# Patient Record
Sex: Female | Born: 1950
Health system: Southern US, Community
[De-identification: ages and names within clinical notes are randomized; demographics above are authoritative.]

## PROBLEM LIST (undated history)

## (undated) DIAGNOSIS — R32 Unspecified urinary incontinence: Secondary | ICD-10-CM

## (undated) DIAGNOSIS — K5792 Diverticulitis of intestine, part unspecified, without perforation or abscess without bleeding: Secondary | ICD-10-CM

## (undated) DIAGNOSIS — E785 Hyperlipidemia, unspecified: Secondary | ICD-10-CM

## (undated) DIAGNOSIS — M545 Low back pain, unspecified: Secondary | ICD-10-CM

## (undated) DIAGNOSIS — G4700 Insomnia, unspecified: Secondary | ICD-10-CM

## (undated) DIAGNOSIS — I499 Cardiac arrhythmia, unspecified: Secondary | ICD-10-CM

## (undated) DIAGNOSIS — I10 Essential (primary) hypertension: Secondary | ICD-10-CM

## (undated) DIAGNOSIS — G8929 Other chronic pain: Secondary | ICD-10-CM

## (undated) DIAGNOSIS — K219 Gastro-esophageal reflux disease without esophagitis: Secondary | ICD-10-CM

## (undated) DIAGNOSIS — K589 Irritable bowel syndrome without diarrhea: Secondary | ICD-10-CM

## (undated) DIAGNOSIS — M797 Fibromyalgia: Secondary | ICD-10-CM

## (undated) DIAGNOSIS — E119 Type 2 diabetes mellitus without complications: Secondary | ICD-10-CM

## (undated) DIAGNOSIS — R519 Headache, unspecified: Secondary | ICD-10-CM

## (undated) DIAGNOSIS — G629 Polyneuropathy, unspecified: Secondary | ICD-10-CM

## (undated) DIAGNOSIS — I491 Atrial premature depolarization: Secondary | ICD-10-CM

## (undated) DIAGNOSIS — R51 Headache: Secondary | ICD-10-CM

## (undated) DIAGNOSIS — M1990 Unspecified osteoarthritis, unspecified site: Secondary | ICD-10-CM

## (undated) DIAGNOSIS — K5790 Diverticulosis of intestine, part unspecified, without perforation or abscess without bleeding: Secondary | ICD-10-CM

## (undated) DIAGNOSIS — J45909 Unspecified asthma, uncomplicated: Secondary | ICD-10-CM

## (undated) DIAGNOSIS — E559 Vitamin D deficiency, unspecified: Secondary | ICD-10-CM

## (undated) HISTORY — DX: Unspecified osteoarthritis, unspecified site: M19.90

## (undated) HISTORY — DX: Type 2 diabetes mellitus without complications: E11.9

## (undated) HISTORY — DX: Cardiac arrhythmia, unspecified: I49.9

## (undated) HISTORY — PX: BACK SURGERY: SHX140

## (undated) HISTORY — DX: Low back pain: M54.5

## (undated) HISTORY — DX: Irritable bowel syndrome without diarrhea: K58.9

## (undated) HISTORY — DX: Diverticulosis of intestine, part unspecified, without perforation or abscess without bleeding: K57.90

## (undated) HISTORY — PX: OTHER SURGICAL HISTORY: SHX169

## (undated) HISTORY — PX: TOTAL KNEE ARTHROPLASTY: SHX125

## (undated) HISTORY — DX: Fibromyalgia: M79.7

## (undated) HISTORY — DX: Headache: R51

## (undated) HISTORY — DX: Low back pain, unspecified: M54.50

## (undated) HISTORY — DX: Atrial premature depolarization: I49.1

## (undated) HISTORY — DX: Gastro-esophageal reflux disease without esophagitis: K21.9

## (undated) HISTORY — DX: Vitamin D deficiency, unspecified: E55.9

## (undated) HISTORY — DX: Essential (primary) hypertension: I10

## (undated) HISTORY — DX: Hyperlipidemia, unspecified: E78.5

## (undated) HISTORY — DX: Unspecified asthma, uncomplicated: J45.909

## (undated) HISTORY — DX: Polyneuropathy, unspecified: G62.9

## (undated) HISTORY — DX: Insomnia, unspecified: G47.00

## (undated) HISTORY — DX: Morbid (severe) obesity due to excess calories: E66.01

## (undated) HISTORY — DX: Headache, unspecified: R51.9

## (undated) HISTORY — DX: Diverticulitis of intestine, part unspecified, without perforation or abscess without bleeding: K57.92

## (undated) HISTORY — DX: Unspecified urinary incontinence: R32

## (undated) HISTORY — DX: Other chronic pain: G89.29

---

## 1988-11-08 HISTORY — PX: OTHER SURGICAL HISTORY: SHX169

## 1991-11-09 HISTORY — PX: OTHER SURGICAL HISTORY: SHX169

## 1998-04-25 ENCOUNTER — Ambulatory Visit (HOSPITAL_COMMUNITY): Admission: RE | Admit: 1998-04-25 | Discharge: 1998-04-25 | Payer: Self-pay | Admitting: *Deleted

## 1998-09-05 ENCOUNTER — Ambulatory Visit (HOSPITAL_COMMUNITY): Admission: RE | Admit: 1998-09-05 | Discharge: 1998-09-05 | Payer: Self-pay | Admitting: *Deleted

## 1998-09-05 ENCOUNTER — Encounter: Payer: Self-pay | Admitting: *Deleted

## 2001-12-28 ENCOUNTER — Encounter: Payer: Self-pay | Admitting: Internal Medicine

## 2001-12-28 ENCOUNTER — Ambulatory Visit (HOSPITAL_COMMUNITY): Admission: RE | Admit: 2001-12-28 | Discharge: 2001-12-28 | Payer: Self-pay | Admitting: Internal Medicine

## 2002-01-16 ENCOUNTER — Other Ambulatory Visit: Admission: RE | Admit: 2002-01-16 | Discharge: 2002-01-16 | Payer: Self-pay | Admitting: Internal Medicine

## 2004-01-23 ENCOUNTER — Emergency Department (HOSPITAL_COMMUNITY): Admission: EM | Admit: 2004-01-23 | Discharge: 2004-01-24 | Payer: Self-pay | Admitting: Emergency Medicine

## 2004-02-20 ENCOUNTER — Encounter: Admission: RE | Admit: 2004-02-20 | Discharge: 2004-02-20 | Payer: Self-pay | Admitting: Internal Medicine

## 2005-01-22 ENCOUNTER — Encounter: Admission: RE | Admit: 2005-01-22 | Discharge: 2005-01-22 | Payer: Self-pay | Admitting: Internal Medicine

## 2006-04-29 ENCOUNTER — Encounter: Admission: RE | Admit: 2006-04-29 | Discharge: 2006-04-29 | Payer: Self-pay | Admitting: Internal Medicine

## 2007-05-18 ENCOUNTER — Ambulatory Visit (HOSPITAL_BASED_OUTPATIENT_CLINIC_OR_DEPARTMENT_OTHER): Admission: RE | Admit: 2007-05-18 | Discharge: 2007-05-18 | Payer: Self-pay | Admitting: Orthopedic Surgery

## 2007-05-18 ENCOUNTER — Encounter (INDEPENDENT_AMBULATORY_CARE_PROVIDER_SITE_OTHER): Payer: Self-pay | Admitting: Orthopedic Surgery

## 2007-06-27 ENCOUNTER — Encounter (HOSPITAL_BASED_OUTPATIENT_CLINIC_OR_DEPARTMENT_OTHER): Admission: RE | Admit: 2007-06-27 | Discharge: 2007-08-08 | Payer: Self-pay | Admitting: Surgery

## 2007-07-28 ENCOUNTER — Encounter: Admission: RE | Admit: 2007-07-28 | Discharge: 2007-07-28 | Payer: Self-pay | Admitting: Internal Medicine

## 2007-08-17 ENCOUNTER — Encounter (HOSPITAL_BASED_OUTPATIENT_CLINIC_OR_DEPARTMENT_OTHER): Admission: RE | Admit: 2007-08-17 | Discharge: 2007-11-15 | Payer: Self-pay | Admitting: Surgery

## 2007-11-09 HISTORY — PX: OTHER SURGICAL HISTORY: SHX169

## 2007-12-04 ENCOUNTER — Encounter (INDEPENDENT_AMBULATORY_CARE_PROVIDER_SITE_OTHER): Payer: Self-pay | Admitting: Orthopedic Surgery

## 2007-12-04 ENCOUNTER — Ambulatory Visit (HOSPITAL_BASED_OUTPATIENT_CLINIC_OR_DEPARTMENT_OTHER): Admission: RE | Admit: 2007-12-04 | Discharge: 2007-12-05 | Payer: Self-pay | Admitting: Orthopedic Surgery

## 2007-12-21 ENCOUNTER — Encounter (INDEPENDENT_AMBULATORY_CARE_PROVIDER_SITE_OTHER): Payer: Self-pay | Admitting: Orthopedic Surgery

## 2007-12-21 ENCOUNTER — Ambulatory Visit (HOSPITAL_BASED_OUTPATIENT_CLINIC_OR_DEPARTMENT_OTHER): Admission: RE | Admit: 2007-12-21 | Discharge: 2007-12-21 | Payer: Self-pay | Admitting: Orthopedic Surgery

## 2008-03-29 ENCOUNTER — Encounter: Admission: RE | Admit: 2008-03-29 | Discharge: 2008-03-29 | Payer: Self-pay | Admitting: Orthopedic Surgery

## 2008-06-07 ENCOUNTER — Encounter: Admission: RE | Admit: 2008-06-07 | Discharge: 2008-06-07 | Payer: Self-pay | Admitting: Internal Medicine

## 2008-07-23 ENCOUNTER — Ambulatory Visit (HOSPITAL_COMMUNITY): Admission: RE | Admit: 2008-07-23 | Discharge: 2008-07-23 | Payer: Self-pay | Admitting: Orthopedic Surgery

## 2009-04-12 ENCOUNTER — Emergency Department (HOSPITAL_COMMUNITY): Admission: EM | Admit: 2009-04-12 | Discharge: 2009-04-12 | Payer: Self-pay | Admitting: Emergency Medicine

## 2010-11-29 ENCOUNTER — Encounter: Payer: Self-pay | Admitting: Internal Medicine

## 2010-11-29 ENCOUNTER — Encounter: Payer: Self-pay | Admitting: Orthopedic Surgery

## 2011-02-10 ENCOUNTER — Encounter (HOSPITAL_BASED_OUTPATIENT_CLINIC_OR_DEPARTMENT_OTHER): Payer: Medicare Other | Attending: Plastic Surgery

## 2011-02-10 DIAGNOSIS — E1169 Type 2 diabetes mellitus with other specified complication: Secondary | ICD-10-CM | POA: Insufficient documentation

## 2011-02-10 DIAGNOSIS — L97509 Non-pressure chronic ulcer of other part of unspecified foot with unspecified severity: Secondary | ICD-10-CM | POA: Insufficient documentation

## 2011-02-11 ENCOUNTER — Encounter (HOSPITAL_BASED_OUTPATIENT_CLINIC_OR_DEPARTMENT_OTHER): Payer: Medicare Other

## 2011-02-15 LAB — GLUCOSE, CAPILLARY: Glucose-Capillary: 218 mg/dL — ABNORMAL HIGH (ref 70–99)

## 2011-02-17 ENCOUNTER — Other Ambulatory Visit (HOSPITAL_COMMUNITY): Payer: Self-pay | Admitting: Plastic Surgery

## 2011-02-17 ENCOUNTER — Ambulatory Visit (HOSPITAL_COMMUNITY)
Admission: RE | Admit: 2011-02-17 | Discharge: 2011-02-17 | Disposition: A | Discharge status: Home / Self Care | Payer: Medicare Other | Source: Ambulatory Visit | Attending: Plastic Surgery | Admitting: Plastic Surgery

## 2011-02-17 DIAGNOSIS — L97509 Non-pressure chronic ulcer of other part of unspecified foot with unspecified severity: Secondary | ICD-10-CM | POA: Insufficient documentation

## 2011-02-17 DIAGNOSIS — R0602 Shortness of breath: Secondary | ICD-10-CM | POA: Insufficient documentation

## 2011-02-17 DIAGNOSIS — E119 Type 2 diabetes mellitus without complications: Secondary | ICD-10-CM | POA: Insufficient documentation

## 2011-02-17 DIAGNOSIS — T148XXA Other injury of unspecified body region, initial encounter: Secondary | ICD-10-CM

## 2011-02-17 DIAGNOSIS — S98139A Complete traumatic amputation of one unspecified lesser toe, initial encounter: Secondary | ICD-10-CM | POA: Insufficient documentation

## 2011-02-17 DIAGNOSIS — M7730 Calcaneal spur, unspecified foot: Secondary | ICD-10-CM | POA: Insufficient documentation

## 2011-02-18 NOTE — H&P (Signed)
NAME:  Yesenia Payne, Yesenia Payne             ACCOUNT NO.:  [id number]  MEDICAL RECORD NO.:  [id number]           PATIENT TYPE:  O  LOCATION:  FOOT                         FACILITY:  [facility]  PHYSICIAN:  Wayland Denis, DO      DATE OF BIRTH:  10-30-1951  DATE OF ADMISSION:  02/10/2011 DATE OF DISCHARGE:                             HISTORY & PHYSICAL   CHIEF COMPLAINT:  Left lower extremity wound.  HISTORY OF PRESENT ILLNESS:  Yesenia Payne is a 60 year old female who is here today for evaluation of left lower extremity wound on the medial aspect of her proximal great toe.  She has severe diabetes and has undergone multiple digit amputations of her lower extremities in the recent past.  She has been dealing with this wound for several weeks. With her family doctor, she has been using epsom salts soaks and Neosporin.  She showed a picture in which indicates that it has actually improved over the past few weeks.  She does not have feeling and does not complain of pain.  She is using protective foot wear and trying to keep it elevated.  This has occurred multiple times in the past with different areas of her feet.  PAST MEDICAL HISTORY:  Hypercholesterolemia, kidney stones, arthritis, fibromyalgia, chronic migraines, glaucoma, diabetes, neuropathy, hypertension, asthma, peripheral vascular disease, arterial insufficiency.  SURGICAL HISTORY:  Back surgery x2; knee surgery 1993, 1994; C-section x2; amputation of left third toe, right second and right fifth toe.  ALLERGIES:  No known drug allergies.  MEDICATIONS:  Augmentin, promethazine, Humalog, vitamin D2, Nexium, ProAir, Trilipix, Lyrica, Claritin, Imitrex, Travatan, Topamax, Xanax, Crestor, Lidoderm patch, Metanx, Kadian, Nasonex, Advair, triamterene, Flexeril, Cymbalta, Prinivil, Ambien, Vicodin, Detrol.  FAMILY HISTORY:  At this point, noncontributory.  PAST MEDICAL HISTORY:  Described above.  SOCIAL HISTORY:  Lives at home, is not a  smoker.  PHYSICAL EXAMINATION:  GENERAL:  Alert and oriented, cooperative, in no acute distress. HEENT:  Pupils are equal and reactive.  Extraocular muscles are intact. NECK:  She has no cervical tenderness. LUNGS:  Clear. HEART:  Regular. ABDOMEN:  Large, but soft and nontender.  Unable to palpate organs due to size. EXTREMITIES:  She has palpable pulses in the lower extremities, varicosities with mild clubbing of the lower extremities, multiple amputated digits, those areas are well healed.  Her wound is described in the nurse's note.  It is on the proximal portion of the left great toe on the medial aspect, debridement was done.  ASSESSMENT:  Left lower extremity diabetic wound.  PLAN:  We will use Prisma, hydrogel, Adaptic gauze, Kerlix with triple antibiotic ointment in it.  We will check on the lab work she had recently at her primary care office and if needed, order more including prealbumin.  We will also look at vascular studies to determine if there is anything that can be done if this does not heal up, and we will see her back in a week.  We have encouraged elevation, rest, multivitamin, Dial soap soaks when she does the dressing change.     Wayland Denis, DO     CS/MEDQ  D:  02/10/2011  T:  02/11/2011  Job:  [id number]  Electronically Signed by Wayland Denis  on 02/18/2011 07:47:15 AM

## 2011-02-22 ENCOUNTER — Encounter (INDEPENDENT_AMBULATORY_CARE_PROVIDER_SITE_OTHER): Payer: Medicare Other

## 2011-02-22 DIAGNOSIS — L97909 Non-pressure chronic ulcer of unspecified part of unspecified lower leg with unspecified severity: Secondary | ICD-10-CM

## 2011-03-10 ENCOUNTER — Encounter (HOSPITAL_BASED_OUTPATIENT_CLINIC_OR_DEPARTMENT_OTHER): Payer: Medicare Other | Attending: Plastic Surgery

## 2011-03-10 DIAGNOSIS — Z794 Long term (current) use of insulin: Secondary | ICD-10-CM | POA: Insufficient documentation

## 2011-03-10 DIAGNOSIS — I739 Peripheral vascular disease, unspecified: Secondary | ICD-10-CM | POA: Insufficient documentation

## 2011-03-10 DIAGNOSIS — E1169 Type 2 diabetes mellitus with other specified complication: Secondary | ICD-10-CM | POA: Insufficient documentation

## 2011-03-10 DIAGNOSIS — Z79899 Other long term (current) drug therapy: Secondary | ICD-10-CM | POA: Insufficient documentation

## 2011-03-10 DIAGNOSIS — G589 Mononeuropathy, unspecified: Secondary | ICD-10-CM | POA: Insufficient documentation

## 2011-03-10 DIAGNOSIS — IMO0001 Reserved for inherently not codable concepts without codable children: Secondary | ICD-10-CM | POA: Insufficient documentation

## 2011-03-10 DIAGNOSIS — L97509 Non-pressure chronic ulcer of other part of unspecified foot with unspecified severity: Secondary | ICD-10-CM | POA: Insufficient documentation

## 2011-03-10 DIAGNOSIS — I10 Essential (primary) hypertension: Secondary | ICD-10-CM | POA: Insufficient documentation

## 2011-03-10 DIAGNOSIS — E7800 Pure hypercholesterolemia, unspecified: Secondary | ICD-10-CM | POA: Insufficient documentation

## 2011-03-10 DIAGNOSIS — S98139A Complete traumatic amputation of one unspecified lesser toe, initial encounter: Secondary | ICD-10-CM | POA: Insufficient documentation

## 2011-03-10 NOTE — Assessment & Plan Note (Signed)
[facility]  NAME:  Yesenia Payne, Yesenia Payne             ACCOUNT NO.:  [id number]  MEDICAL RECORD NO.:  [id number]      DATE OF BIRTH:  07-Jun-1951  PHYSICIAN:  Wayland Denis, DO       VISIT DATE:  03/10/2011                                  OFFICE VISIT   HISTORY OF PRESENT ILLNESS:  Yesenia Payne is here for a followup on her left lower extremity wound.  She has been using triple antibiotic ointment with Prisma over the last several weeks.  She was sick last week and unable to come for her appointments, so the vest dressing did not change type.  She is doing extremely well, overall feeling better, and her leg is doing better as well.  There have been no changes in her medications, family history or social history.  PHYSICAL EXAMINATION:  GENERAL:  She is alert, oriented, cooperative, and in no acute distress.  She is very pleasant and looks very good today. HEENT:  Her pupils are equal and reactive.  Extraocular muscles are intact. NECK:  No cervical lymphadenopathy. LUNGS:  Clear. HEART:  Regular. ABDOMEN:  Soft. EXTREMITIES:  Lower extremities:  She has a little scab over the wound site.  No sign of infection, no erythema, and no swelling.  Her vascular studies came back and show she has adequate perfusion, this is all encouraging.  ASSESSMENT:  Left lower extremity diabetic wound.  PLAN:  Continue triple antibiotic ointment and Prisma and we will see her back in 2 weeks.     Wayland Denis, DO     CS/MEDQ  D:  03/10/2011  T:  03/10/2011  Job:  [id number]

## 2011-03-23 NOTE — Assessment & Plan Note (Signed)
[facility]   NAME:  Yesenia Payne, Yesenia Payne             ACCOUNT NO.:  [id number]   MEDICAL RECORD NO.:  [id number]      DATE OF BIRTH:  1951-09-10   PHYSICIAN:  Maxwell Caul, M.D. VISIT DATE:  08/18/2007                                   OFFICE VISIT   [name] returns today in followup for her Wagner's grade 2  ulcerations.  She was seen 2 weeks ago and since then has been using  antiseptic soap washes, Polysporin, and continued in healing sandals.  She is in the process of trying to obtain diabetic shoes and custom  inserts.  She is reporting that she has had tremendous difficulty trying  to obtain her diabetic shoes from her insurance and has been working on  this since February 2008.  This is unusual, given the well-recognized  cost:benefit of diabetic foot wear.  Nevertheless, this is what she is  reporting.   EXAMINATION:  Temperature is 98.2, pulse 61, respirations 16, blood  pressure is 191/94.  The wound on the top of her right great toe is healed and I am declaring  this resolved.  She has had a partial amputation on the right great  already.  The area of her left great toe, once again, had a fair amount  of redundant callus which I pared back.  Underneath this there is only a  small open area.  I think this is well on its way to resolving.   WOUND CARE PLAN AND FOLLOWUP:  We will continue with the b.i.d.  antiseptic soap washes, Polysporin, and her healing sandals.  I really  cannot understand why she is having such trouble with her insurance with  regards to diabetic foot wear in a patient who is a diabetic and has  already had partial amputations.  I am sure would assist her as  necessary.  She tells me that Dr. Renae Fickle was already called on her behalf.  We will see this again in 2 weeks.           ______________________________  Maxwell Caul, M.D.     MGR/MEDQ  D:  08/18/2007  T:  08/19/2007  Job:  [id number]

## 2011-03-23 NOTE — Op Note (Signed)
Yesenia, Payne             ACCOUNT NO.:  [id number]   MEDICAL RECORD NO.:  [id number]          PATIENT TYPE:  AMB   LOCATION:  [facility]                         FACILITY:  [facility]   PHYSICIAN:  Deidre Ala, M.D.    DATE OF BIRTH:  01-10-1951   DATE OF PROCEDURE:  05/18/2007  DATE OF DISCHARGE:                               OPERATIVE REPORT   PREOPERATIVE DIAGNOSES:  1. Chronically infected right great toenail in a diabetic with a      markedly deformed, onychomycotic nail.  2. A 0.6 x 0.6 cm granulomatous lesion, superficial medial cuticle,      right great toenail.   POSTOPERATIVE DIAGNOSES:  1. Chronically infected right great toenail in a diabetic with a      markedly deformed, onychomycotic nail.  2. A 0.6 x 0.6 cm granulomatous lesion, superficial medial cuticle,      right great toenail.   PROCEDURES:  1. Right great toe nail plate avulsion with matrix excision and bed      excision.  2. Removal of granulomatous mass.  3. Phalangeal shortening with amputation of tuft of distal phalanx and      cosmetic.   SURGEON:  1. Yesenia Payne, M.D.   ASSISTANT:  Phineas Semen, P.A.-C.   ANESTHESIA:  Ankle block with sedation.   SPECIMENS:  Nail bed and granulomatous mass.   ESTIMATED BLOOD LOSS:  Minimal.   TOURNIQUET TIME:  1 hour, Esmarch.   PATHOLOGIC FINDINGS AND HISTORY:  Yesenia Payne has a chronically infected  onychomycotic nail of the right great toe, lifting almost straight up  out of the bed.  There has been no gross purulence.  She has been  keeping it soaked and has been on Cipro until surgery.  The bed was  chronically infected, the skin hyperkeratinous, and a granulomatous  mass, medial cuticle of the great toenail, measuring 0.6  0.6 cm.  We  did the above-listed ablation procedure and shortened the phalanx,  removed the granulomatous mass.   PROCEDURE:  With adequate anesthesia obtained using ankle block  technique with sedation, the patient was placed in  the supine position.  The right lower extremity was prepped from the toes to the midcalf in  the standard fashion.  After standard prepping and draping, Esmarch  examination was used and kept up above the ankle.  I then made radial-  based incisions in the base of the nail at the cuticle.  I developed the  flaps.  I then excised the granulomas mass in total.  Then I avulsed the  nail sharply off its bed.  I then removed the nail matrix sharply and  cauterized with the Bovie.  I then debrided the distal thickened,  keratinous mass, dissecting around the tuft of the phalanx and amputated  the distal tuft about just distal to midphalanx-distal phalanx level  with a sharp bone-cutting instrument.  Irrigation was then carried out  and flaps were fashioned, bringing the distal flap up to proximal and  the side flaps together, sewing it meticulously with 4-0 nylon vertical  mattress in interrupted sutures for multiple times to  achieve an even  mass.  I did thin the distal flap to achieve parity of the height of the  wound and removed all skin material that had looked like it may have  chronic fungus or granulation tissue within it.  The flaps remaining  were very normal and healthy-looking.  The tourniquet was let down.  A  bulky sterile compressive dressing was applied.  The patient then,  having tolerated the procedure well, was awakened, taken to the recovery  room in satisfactory condition, to be discharged per outpatient routine.  Percocet for pain, to continue with Cipro.  Told to walk on her heel,  minimal walking at first, elevation, and told call the office for an  appointment for recheck on Monday.     Laboratory data within normal limits.           ______________________________  V. Yesenia Payne, M.D.     VEP/MEDQ  D:  05/18/2007  T:  05/19/2007  Job:  [id number]

## 2011-03-23 NOTE — Assessment & Plan Note (Signed)
[facility]   NAME:  Yesenia Payne, Yesenia Payne             ACCOUNT NO.:  [id number]   MEDICAL RECORD NO.:  [id number]      DATE OF BIRTH:  10-24-1951   PHYSICIAN:  Theresia Majors. Tanda Rockers, M.D.      VISIT DATE:                                   OFFICE VISIT   REASON FOR CONSULTATION:  [name] is a 60 year old female referred by  Dr. Doristine Section for evaluation of a non-healing ulceration of the right  hallux.   IMPRESSION:  Wagner grade 4 diabetic foot ulcer.   RECOMMENDATIONS:  Proceed with hyperbaric oxygen treatment, a total of  30 dives at 2 atmospheres, 100% oxygen at 490 minutes, total number  dives ordered 30.   SUBJECTIVE:  [name] is a 60 year old lady with multiple medical  problems, including fibromyalgia, neuropathy, degenerative arthritis,  asthma, migraines, hypertension, hypercholesteremia, sleep apnea, carpal  tunnel gastroesophageal reflux disease and diverticulosis.   The current ulceration has been present for approximately 6 months and  has been refractory to multiple debridements, multiple antibiotic  regimens, which have been directed by specific cultures.  He has had  offloading orthotics, and in spite of these efforts, she continues to  have an open wound.  She has been aggressively managed and debrided by  Dr. Renae Fickle, in an office setting.   PAST MEDICAL HISTORY:  Is remarkable for diabetes.   MEDICATIONS:  Her current medication list includes:  1. Metformin 1 gram b.i.d.  2. Lidoderm pain patches three per day.  3. Albuterol inhaler and nebulizer as directed.  4. Cymbalta 60 mg once a day.  5. Detrol LA 4 mg two times daily.  6. Advair 250/50 2 puffs a day.  7. Crestor 10 mg two times a day.  8. Triamterene hydrochlorothiazide 37.5/25 one a day.  9. Prinivil 60, one a day.  10.Zyrtec 10 mg a day.  11.Methadone 10 mg 5 times daily.  12.Metanx 60 mg two times daily.  13.Protonix 40 mg one a day.  14.Lyrica 300 mg three times a day.  15.Topamax 100 mg, one at bedtime.  16.Ambien 10 mg one h.s.  17.Phenergan 25 mg p.r.n. nausea.  18.Imitrex 100 mg p.r.n. for migraines.  19.Nasonex.  20.Relafen 2 tablets q. day.  21.Tricor 145 mg two times per day.  22.Xanax 0.5 mg for anxiety.  23.Flexeril 10 mg at bedtime, and most recently she has been on      Augmentin.   ALLERGIES:  SHE DENIES ALLERGIES.   PREVIOUS SURGERIES:  Include a C-section in 1977 and 1979, total knee  replacement on the right in 1991, lumbar laminectomy in 1992, and a left  total knee replacement in 1993.  She has had multiple arthroscopies.  In  2008, she had a partial toe amputation July 10th.   FAMILY HISTORY:  Is positive for hypertension, diabetes, stroke and  cancer.   SOCIAL HISTORY:  Socially, she lives in [place].  She has two  children.  She is disabled, due to her degenerative joint disease.   REVIEW OF SYSTEMS:  She reports that she has gained 25 to 30 pounds over  the last year, in part due to her with oral hypoglycemic agents.  Those  have been recently changed.  She is now beginning to  lose weight again.  She is significantly any activity-restricted, due to pain.  She moves  within her house.  She does not move independently, otherwise.  She does  use a cane, while in the house.  There is been no hemoptysis, no  tachycardia, and no frank syncope.  Her bowel or bladder function are  described as normal.  Her esophageal reflux symptoms are under good  control, as long as she takes her medication.  The remainder of the  review of systems is negative.   PHYSICAL EXAM:  GENERAL:  She is an alert, oriented female, appears to  be in good contact with reality, spontaneous in questioning.  She is  accompanied by her daughter.  VITAL SIGNS:  Blood pressure is 181/106.  Blood pressure is 109,  respirations 20, pulse rate 58, temperature 97.9.  HEENT:  Exam is clear.  NECK:  Supple.  Trachea is midline.  Thyroid is nonpalpable.  LUNGS:   Clear.  HEART:  Sounds were distant.  ABDOMEN:  Soft.  EXTREMITY:  Exam is abnormal with bilateral 2+ edema with chronic  changes of stasis, but no frank stasis ulceration.  The patient is  insensate to the wine stain filament.  Bilateral dorsalis pedis pulses  3+, the right hallux has a halo of callus, which was left undisturbed.  There appears to be a faint granulation tissue with no significant  exudate.  A Q-tip was used to probe the wound, with extension into  periosteum.  There is no a ascending cellulitis, lymphangitis, or  abscess.   DISCUSSION:  This patient has a Wagner grade 4 diabetic foot ulcer,  which has been under standard and excellent care.  For the past 4  months, she has failed to heal.  We are recommending that we  aggressively treat her with hyperbaric oxygen treatment.  We have  explained the indications and the technique of the administration of  oxygen under pressure to the patient and her daughter, in terms that  they seem to understand.  They gave verbal agreement to proceed as  outlined.   In the interim, we will continue moist moist dressings and weekly  visits, until a dive slot becomes available in the unit.  The patient  will continue on the current antibiotics, Augmentin 850 b.i.d.      Harold A. Tanda Rockers, M.D.  Electronically Signed     HAN/MEDQ  D:  06/29/2007  T:  06/30/2007  Job:  [id number]   cc:   Deidre Ala, M.D.  Deirdre Peer. Polite, M.D.

## 2011-03-23 NOTE — Assessment & Plan Note (Signed)
[facility]   NAME:  Yesenia Payne, Yesenia Payne             ACCOUNT NO.:  [id number]   MEDICAL RECORD NO.:  [id number]      DATE OF BIRTH:  1951-10-09   PHYSICIAN:  Theresia Majors. Tanda Payne, M.D. VISIT DATE:  07/06/2007                                   OFFICE VISIT   SUBJECTIVE:  Yesenia Payne is a 60 year old with a Wagner grade IV diabetic  foot ulcer who we have followed with moist, moist dressings and  antiseptic soap washes.  She had been scheduled to proceed with  hyperbaric oxygen treatment for a recalcitrant wound.  In the interim  she reports that there has been decrease in drainage, and in fact, the  wound has closed significantly.   OBJECTIVE:  Blood pressure is 164/102, respirations are 18, pulse rate  57, temperature 97.8.  Capillary blood glucose is 113 mg percent.  She  is accompanied by her daughter.  Inspection of the right great toe shows  that there has been, in fact, remarkable contraction of the wound as a  result of local care.  There is no exposed bone at this point.  A Q-tip  was used to probe the wound, and there is only healthy-appearing  granulation with better than 80% contraction of the wound.  Inspection  of the left hallux shows that there is a thick callus with fissures  extending to a superficial ulceration.  This callus was excised with  clippers, and the resulting hemorrhage was controlled with a silver  nitrate stick.  There is no evidence of infection.  There are bilateral  palpable pulses.   ASSESSMENT:  Marked improvement of the Wagner IV diabetic foot ulcer and  a Wagner II diabetic foot ulcer involving the left hallux.   PLAN:  We will remove the patient from the waiting list of HBO, and  instructed her to continue moist, moist dressings of the right hallux as  well as the left.  We will reevaluate the patient in 1 week.      Yesenia Payne, M.D.  Electronically Signed     HAN/MEDQ  D:  07/06/2007  T:  07/07/2007  Job:   [id number]

## 2011-03-23 NOTE — Op Note (Signed)
[name], [name]             ACCOUNT NO.:  [id number]   MEDICAL RECORD NO.:  [id number]          PATIENT TYPE:  AMB   LOCATION:  [facility]                         [facility]ACILITY:  [facility]   PHYSICIAN:  Deidre Ala, M.D.    DATE OF BIRTH:  [birth year]   DATE OF PROCEDURE:  12/21/2007  DATE OF DISCHARGE:                               OPERATIVE REPORT   PREOPERATIVE DIAGNOSIS:  Left third toe diabetic abscess.   POSTOPERATIVE DIAGNOSIS:  Left third toe diabetic abscess.   PROCEDURE:  Amputation of left third toe through MTP joint.   SURGEON:  1. Charlesetta Shanks, M.D.   ASSISTANT:  Phineas Semen, P.A.-C   ANESTHESIA:  Ankle block.   CULTURES:  None.   DRAINS:  None.   ESTIMATED BLOOD LOSS:  Minimal.   TOURNIQUET TIME:  Esmarch for 8 minutes.   PATHOLOGIC FINDINGS AND HISTORY:  Yesenia Payne is a 60 year old severe  diabetic with multiple curl toes.  She recently underwent right second  toe amputation for uncontrollable abscess, and this is about 2-1/2-to-3  weeks out is basically doing very well from this, and feels better.  The  left third toe flared up.  She has a marked mallet toe with marked  abscess on the tip.  She did undergo local treatment, was on Augmentin  and soaks, cleared somewhat, but the patient and the family and I  decided that this toe was very much at risk for continued problems with  her diabetes. The other toes are not as at risk because they do not have  the flexion deformity, and the decubitus on the tip.  The family,  therefore, desired to proceed with left third toe amputation as per our  original plan last week when the toe was not doing as well.   At surgery, no other untoward features were noted.  She had no  significant problems with infection proximal, and we took it through the  MTP joint.  There was no significant bleeding at closure with the  tourniquet let down.  Skin flaps looked good.   DESCRIPTION OF PROCEDURE:  With adequate anesthesia obtained  using ankle  block technique, the patient was placed in the supine position.  The  left foot was prepped from toes to the midcalf in the standard fashion.  After standard prepping and draping, we then isolated the left third toe  and made an elliptic-type flap, dorsal and plantar, around toe in the  web.   Incision was deepened sharply with the knife and hemostasis obtained  using the Bovie electrocoagulated.  Dissection was carried down to the  base of the proximal phalanx and the soft tissues and tendons were  released.  We cut the neurovascular bundles and cauterized them, and  allowed them to retract.   The tourniquet was then let down.  Bleeding points were cauterized.  The  wound was then closed with a single-layer closure of interrupted  vertical mattress sutures of 4-0 nylon.  A bulky sterile compressive  forefoot dressing was applied.  The patient, having tolerated the  procedure well, was awakened; taken to recovery room in  satisfactory  condition; to be discharged per outpatient routine, elevation, crutches,  and weightbearing as tolerated with walker also.  Told to call the  office for recheck on Monday.  Given Augmentin for pain.           ______________________________  V. Charlesetta Shanks, M.D.     VEP/MEDQ  D:  12/21/2007  T:  12/22/2007  Job:  [id number]   cc:   Deirdre Peer. Polite, M.D.

## 2011-03-23 NOTE — Assessment & Plan Note (Signed)
[facility]   NAME:  Yesenia Payne, Yesenia Payne                 ACCOUNT NO.:  0   MEDICAL RECORD NO.:  [id number]      DATE OF BIRTH:  [birth year]   PHYSICIAN:  Theresia Majors. Tanda Rockers, M.D. VISIT DATE:  07/20/2007                                   OFFICE VISIT   SUBJECTIVE:  Yesenia Payne is a 60 year old lady who follow for bilateral  Wegner grade 4 and 2 ulcerations involving the right hallux and left  hallux respectively.  In the interim, she has used moist-moist dressings  and open toed healing sandals.  There has been no excessive drainage,  malodor, pain or fever.   OBJECTIVE:  VITAL SIGNS:  Blood pressure is 135/87, respirations 18,  pulse rate 69, temperature 98.2.  Capillary blood glucose is 114 mg  percent.  EXTREMITIES:  Inspection of the lower extremity shows a persistence of  2+ edema.  The right hallux has a small area of desquamation which was  mechanically debrided, demonstrating healthy granulation.  Similarly,  the left great toe has a small area of granulation with advancing  epithelium.  The pedal pulses are readily palpable.  There is no  evidence of infection.   ASSESSMENT:  Clinical improvement.   PLAN:  We will continue b.i.d. antiseptic soap washes, thorough drying  and the use of the white cotton sock and the healing sandal.  We are  referring the patient to orthotics for custom inserts and extra-depth  shoes.  We have written a prescription and explained the therapeutic  goals to the patient and her daughter in terms that they seem to  understand.  We have given them an opportunity to ask questions, and  they expressed gratitude for having been seen in the clinic and indicate  that they will be compliant.      Harold A. Tanda Rockers, M.D.  Electronically Signed     HAN/MEDQ  D:  07/20/2007  T:  07/21/2007  Job:  [id number]

## 2011-03-23 NOTE — Op Note (Signed)
NAME:  Yesenia Payne, Yesenia Payne             ACCOUNT NO.:  [id number]   MEDICAL RECORD NO.:  [id number]          PATIENT TYPE:  AMB   LOCATION:  [facility]                         FACILITY:  [facility] ([facility]   PHYSICIAN:  Deidre Ala, M.D.    DATE OF BIRTH:  06-10-1951   DATE OF PROCEDURE:  12/04/2007  DATE OF DISCHARGE:                               OPERATIVE REPORT   PREOPERATIVE DIAGNOSIS:  Diabetic abscess with probable osteomyelitis,  right second toe.   POSTOPERATIVE DIAGNOSIS:  Diabetic abscess with probable osteomyelitis,  right second toe.   PROCEDURE:  Amputation right second toe through the metatarsophalangeal  joint.   SURGEON:  Doristine Section, M.D.   ASSISTANT:  Phineas Semen, P.A.   ANESTHESIA:  Ankle block with sedation.   ESTIMATED BLOOD LOSS:  Minimal.   TOURNIQUET TIME:  Esmarch 11 minutes.   PATHOLOGIC FINDINGS AND HISTORY:  [name] is a 60 year old diabetic who  we have been fighting chronic infection of this toe mostly cleared with  antibiotics and local treatment.  She has a mallet toe and has developed  a sore around the nail which this time is unresponsive to antibiotics  and local treatment.  Because of concern of spread of the infection more  proximally it was elected to proceed with right second toe amputation.  She does have good capillary refill.  At surgery, no untoward features  were noted.  The level was through the MTP joint.  Flaps came together  nicely with the proximal wound been somewhat V-shaped distally.  Plantar  flap that was brought up in a somewhat triangular fashion with the apex  of the incision at closure of being dorsal with vertical mattress  sutures of 3-0 and 4-0 nylon suture.  Drains were not used.   PROCEDURE:  With adequate anesthesia obtained using ankle block  technique, the patient was placed in the supine position.  The right  lower extremity was prepped from the toes to the upper calf in the  standard fashion.  After standard prepping and  draping, Esmarch  exsanguination was used above the ankle and left on.  I then made a V-  shaped incision proximally away from the cellulitic skin and then  plantarward with as much distal flap as possible.  Skin was incised.  I  then dissected down through the extensor and flexor tendons, the capsule  and cauterized the digital veins, arteries and nerves with a needle tip  Bovie.  Irrigation was carried out through the MTP joint level.  The  amputation was done.  The tourniquet was let down.  Bleeding points  cauterized.  The wound was then closed as above with interrupted  vertical mattress sutures of 3-0 and 4-0 nylon.  A bulky sterile  compressive dressing was applied with pressurization in the gap with a  figure-of-eight dressing up above the ankle with gauze and Ace.  The  patient then having procedure well, was taken to recovery room in  satisfactory condition for overnight observation to be discharged  tomorrow given Vicodin for pain and told call the office for appointment  for recheck toward the end  of the week.  Laboratory data within normal  limits.           ______________________________  V. Charlesetta Shanks, M.D.     VEP/MEDQ  D:  12/04/2007  T:  12/05/2007  Job:  [id number]   cc:   Deirdre Peer. Polite, M.D.

## 2011-03-23 NOTE — Assessment & Plan Note (Signed)
[facility]   NAME:  Yesenia Payne, LOUGHMILLER             ACCOUNT NO.:  [id number]   MEDICAL RECORD NO.:  [id number]      DATE OF BIRTH:  1951/06/13   PHYSICIAN:  Theresia Majors. Tanda Rockers, M.D.      VISIT DATE:                                   OFFICE VISIT   SUBJECTIVE:  [name] is a 60 year old with Wagner grade 2 and 4  ulcerations who we have treated with offloading healing sandals and  antiseptic soap washes.  She is in the process of being fitted for extra-  depth shoes and custom inserts.  In the interim, there has been no  excessive drainage, malodor, pain or fever.  She returns for followup.   OBJECTIVE:  Her blood pressure is 185/96, respirations 16, pulse rate  60, temperature 86.  Capillary blood glucose is 129 mg percent.   Inspection of the lower extremities shows a persistence of 2+ edema of  the right great toe.  The ulcer has decreased.  There is a serous  drainage which was cultured.  There is no evidence of ascending  infection or abscess formation.  The dorsalis pedis pulse is  3+ and readily palpable.  Similarly, the left great toe has a decreasing  ulcer with what appears to be healthy advancing epithelium.  There is no  evidence of infection.  The dorsalis pedis pulse is 3+.   ASSESSMENT:  Clinical improvement.   PLAN:  We have encouraged the patient to continue to pursue the  procurement of her custom footwear.  We will reevaluate her in two  weeks.  She will continue on her antiseptic soap washes and topical  Neosporin.      Harold A. Tanda Rockers, M.D.  Electronically Signed     HAN/MEDQ  D:  08/03/2007  T:  08/03/2007  Job:  [id number]

## 2011-03-23 NOTE — Op Note (Signed)
[name], [name]             ACCOUNT NO.:  [id number]   MEDICAL RECORD NO.:  [id number]          PATIENT TYPE:  AMB   LOCATION:  DAY                          [facility]ACILITY[facility]  [facility]   PHYSICIAN:  Deidre Ala, M.D.    DATE OF BIRTH:  1950-12-18   DATE OF PROCEDURE:  07/23/2008  DATE OF DISCHARGE:                               OPERATIVE REPORT   PREOPERATIVE DIAGNOSIS:  Infected diabetic ulcer right fifth toe with  osteomyelitis.   POSTOPERATIVE DIAGNOSIS:  Infected diabetic ulcer right fifth toe with  osteomyelitis.   PROCEDURE:  Right fifth toe amputation to distal metatarsal neck.   SURGEON:  1. Charlesetta Shanks, M.D.   ASSISTANT:  Phineas Semen, P.A.   ANESTHESIA:  Ankle block.   CULTURES:  None.   DRAINS:  None.   ESTIMATED BLOOD LOSS:  Minimal.   TOURNIQUET TIME:  10 minutes with Esmarch.   PATHOLOGIC FINDINGS AND HISTORY:  Yesenia Payne is a very brittle  diabetic.  We have done second and third amputations of the right foot  toes and that wound has healed.  The right fifth toe has progressed to a  diabetic ulcer which we were not able to cure and has osteomyelitis so  elected to proceed with amputation.  I first amputated through the MTP  joint but the skin edges were a bit tight so I went back and resected  the metatarsal head.  This gave me more room to close with a combination  of vertical mattress sutures and simple interrupted of 2-0 and 3-0  nylon.  The skin flaps looked like they had good capillary refill  without excessive tightness.  She is maintained on Cipro and will be  maintained at home with elevation.  Her family is very used to taking  care of her.   PROCEDURE IN DETAIL:  With adequate anesthesia obtained using ankle  block technique, Cipro given IV prophylaxis 500 mg the patient was  placed in the supine position.  The right foot was prepped from the toes  to the calf in the standard fashion after standard prepping and draping,  Esmarch was used  and left on above the ankle.  I then fashioned the best  skin flaps I could obliquely plantar and dorsal to the toe not including  any cellulitic skin.  I then dissected down to the flexor and extensor  tendons and resected them as well as the neurovascular bundles, resected  to the MTP joint level and amputated the toe.  I then cauterized  bleeding points after letting the tourniquet down and irrigated.  The  closure was a bit tight so I removed the metatarsal head.  I could then  get the wound closed with a combination of sutures as above.  Mildly  compressive dressing with Webril was then applied after thorough  irrigation and hemostasis.  The patient then having tolerated the  procedure well was awakened and taken to the recovery room in  satisfactory condition to be discharged per outpatient routine, given  Percocet for pain and told to call the office for recheck.  Continue her  Cipro and to come back in 3-4 days for recheck.           ______________________________  V. Charlesetta Shanks, M.D.     VEP/MEDQ  D:  07/23/2008  T:  07/25/2008  Job:  [id number]   cc:   Candyce Churn. Allyne Gee, M.D.  Fax: [phone]

## 2011-04-14 ENCOUNTER — Encounter (HOSPITAL_BASED_OUTPATIENT_CLINIC_OR_DEPARTMENT_OTHER): Payer: Medicare Other | Attending: Plastic Surgery

## 2011-04-14 DIAGNOSIS — Z794 Long term (current) use of insulin: Secondary | ICD-10-CM | POA: Insufficient documentation

## 2011-04-14 DIAGNOSIS — G589 Mononeuropathy, unspecified: Secondary | ICD-10-CM | POA: Insufficient documentation

## 2011-04-14 DIAGNOSIS — S98139A Complete traumatic amputation of one unspecified lesser toe, initial encounter: Secondary | ICD-10-CM | POA: Insufficient documentation

## 2011-04-14 DIAGNOSIS — E1169 Type 2 diabetes mellitus with other specified complication: Secondary | ICD-10-CM | POA: Insufficient documentation

## 2011-04-14 DIAGNOSIS — E7800 Pure hypercholesterolemia, unspecified: Secondary | ICD-10-CM | POA: Insufficient documentation

## 2011-04-14 DIAGNOSIS — I10 Essential (primary) hypertension: Secondary | ICD-10-CM | POA: Insufficient documentation

## 2011-04-14 DIAGNOSIS — IMO0001 Reserved for inherently not codable concepts without codable children: Secondary | ICD-10-CM | POA: Insufficient documentation

## 2011-04-14 DIAGNOSIS — Z79899 Other long term (current) drug therapy: Secondary | ICD-10-CM | POA: Insufficient documentation

## 2011-04-14 DIAGNOSIS — L97509 Non-pressure chronic ulcer of other part of unspecified foot with unspecified severity: Secondary | ICD-10-CM | POA: Insufficient documentation

## 2011-04-14 DIAGNOSIS — I739 Peripheral vascular disease, unspecified: Secondary | ICD-10-CM | POA: Insufficient documentation

## 2011-07-29 LAB — POCT I-STAT 4, (NA,K, GLUC, HGB,HCT)
Glucose, Bld: 129 — ABNORMAL HIGH
HCT: 46
Hemoglobin: 15.6 — ABNORMAL HIGH
Operator id: 114531
Potassium: 4
Sodium: 138

## 2011-07-30 LAB — POCT I-STAT 4, (NA,K, GLUC, HGB,HCT)
Glucose, Bld: 141 — ABNORMAL HIGH
HCT: 42
Hemoglobin: 14.3
Operator id: 268271
Potassium: 3.7
Sodium: 138

## 2011-08-09 LAB — BASIC METABOLIC PANEL
BUN: 4 — ABNORMAL LOW
CO2: 24
Calcium: 8.7
Chloride: 105
Creatinine, Ser: 0.68
GFR calc Af Amer: >60
GFR calc non Af Amer: >60
Glucose, Bld: 207 — ABNORMAL HIGH
Potassium: 3.2 — ABNORMAL LOW
Sodium: 141

## 2011-08-09 LAB — GLUCOSE, CAPILLARY
Glucose-Capillary: 172 — ABNORMAL HIGH
Glucose-Capillary: 173 — ABNORMAL HIGH
Glucose-Capillary: 203 — ABNORMAL HIGH

## 2011-08-09 LAB — HEMOGLOBIN AND HEMATOCRIT, BLOOD
HCT: 38.3
Hemoglobin: 13.1

## 2011-08-24 LAB — I-STAT 8, (EC8 V) (CONVERTED LAB)
BUN: 10
Bicarbonate: 27.7 — ABNORMAL HIGH
Chloride: 102
Glucose, Bld: 125 — ABNORMAL HIGH
HCT: 45
Hemoglobin: 15.3 — ABNORMAL HIGH
Operator id: 268271
Potassium: 3.9
Sodium: 141
TCO2: 29
pCO2, Ven: 55.6 — ABNORMAL HIGH
pH, Ven: 7.306 — ABNORMAL HIGH

## 2011-11-11 ENCOUNTER — Emergency Department (HOSPITAL_COMMUNITY)
Admission: EM | Admit: 2011-11-11 | Discharge: 2011-11-11 | Disposition: A | Discharge status: Home / Self Care | Payer: Medicare Other | Attending: Emergency Medicine | Admitting: Emergency Medicine

## 2011-11-11 ENCOUNTER — Emergency Department (HOSPITAL_COMMUNITY): Payer: Medicare Other

## 2011-11-11 DIAGNOSIS — R350 Frequency of micturition: Secondary | ICD-10-CM | POA: Insufficient documentation

## 2011-11-11 DIAGNOSIS — R300 Dysuria: Secondary | ICD-10-CM | POA: Insufficient documentation

## 2011-11-11 DIAGNOSIS — N390 Urinary tract infection, site not specified: Secondary | ICD-10-CM | POA: Insufficient documentation

## 2011-11-11 DIAGNOSIS — R109 Unspecified abdominal pain: Secondary | ICD-10-CM | POA: Insufficient documentation

## 2011-11-11 LAB — URINE MICROSCOPIC-ADD ON

## 2011-11-11 LAB — URINALYSIS, ROUTINE W REFLEX MICROSCOPIC
Bilirubin Urine: NEGATIVE
Glucose, UA: >1000 mg/dL — AB
Ketones, ur: 15 mg/dL — AB
Nitrite: NEGATIVE
Protein, ur: NEGATIVE mg/dL
Specific Gravity, Urine: 1.031 — ABNORMAL HIGH (ref 1.005–1.030)
Urobilinogen, UA: 0.2 mg/dL (ref 0.0–1.0)
pH: 5.5 (ref 5.0–8.0)

## 2011-11-11 LAB — POCT PREGNANCY, URINE: Preg Test, Ur: NEGATIVE

## 2011-11-11 MED ORDER — ONDANSETRON HCL 4 MG/2ML IJ SOLN
4.0000 mg | Freq: Once | INTRAMUSCULAR | Status: AC
  Administered 2011-11-11: 4 mg via INTRAVENOUS
  Filled 2011-11-11: qty 2

## 2011-11-11 MED ORDER — DEXTROSE 5 % IV SOLN
1.0000 g | Freq: Once | INTRAVENOUS | Status: AC
  Administered 2011-11-11: 1 g via INTRAVENOUS
  Filled 2011-11-11: qty 10

## 2011-11-11 MED ORDER — SULFAMETHOXAZOLE-TRIMETHOPRIM 800-160 MG PO TABS: 1.0000 | ORAL_TABLET | Freq: Two times a day (BID) | ORAL | Status: AC

## 2011-11-11 MED ORDER — HYDROMORPHONE HCL PF 1 MG/ML IJ SOLN
1.0000 mg | Freq: Once | INTRAMUSCULAR | Status: AC
  Administered 2011-11-11: 1 mg via INTRAVENOUS
  Filled 2011-11-11: qty 1

## 2011-11-11 NOTE — ED Notes (Signed)
Lt. Flank pain and pressure on her bladder began 1 week ago. Pt. Has hesitancy and burning when she voids

## 2011-11-11 NOTE — ED Provider Notes (Signed)
History    60yF with L flank pain. Gradual onset almost a week ago. No radiation. Sensation of pressure on side and "in my bladder." Increased frequency and dysuria. No fever or chills. No n/v. No diarrhea. No dizziness, lightheadedness or sob.  CSN: [id number]  Arrival date & time 11/11/11  1349   None     Chief Complaint  Patient presents with  . Flank Pain    (Consider location/radiation/quality/duration/timing/severity/associated sxs/prior treatment) HPI  History reviewed. No pertinent past medical history.  History reviewed. No pertinent past surgical history.  No family history on file.  History  Substance Use Topics  . Smoking status: Not on file  . Smokeless tobacco: Not on file  . Alcohol Use: Not on file    OB History    Grav Para Term Preterm Abortions TAB SAB Ect Mult Living                  Review of Systems   Review of symptoms negative unless otherwise noted in HPI.   Allergies  Review of patient's allergies indicates not on file.  Home Medications  No current outpatient prescriptions on file.  BP 143/89  Pulse 107  Temp(Src) 97.5 F (36.4 C) (Oral)  Resp 15  Ht 5\' 4"  (1.626 m)  Wt 250 lb (113.399 kg)  BMI 42.91 kg/m2  SpO2 96%  Physical Exam  Nursing note and vitals reviewed. Constitutional: She appears well-developed and well-nourished. No distress.       Sitting up in bed. NAD. obese  HENT:  Head: Normocephalic and atraumatic.       Poor dentition  Eyes: Conjunctivae are normal. Right eye exhibits no discharge. Left eye exhibits no discharge.  Neck: Neck supple.  Cardiovascular: Normal rate, regular rhythm and normal heart sounds.  Exam reveals no gallop and no friction rub.   No murmur heard. Pulmonary/Chest: Effort normal and breath sounds normal. No respiratory distress.  Abdominal: Soft. She exhibits no distension and no mass. There is no tenderness. There is no rebound and no guarding.  Genitourinary:       No cva  tenderness  Neurological: She is alert.  Skin: Skin is warm and dry.  Psychiatric: She has a normal mood and affect. Her behavior is normal. Thought content normal.    ED Course  Procedures (including critical care time)  Labs Reviewed  URINALYSIS, ROUTINE W REFLEX MICROSCOPIC - Abnormal; Notable for the following:    APPearance HAZY (*)    Specific Gravity, Urine 1.031 (*)    Glucose, UA >1000 (*)    Hgb urine dipstick TRACE (*)    Ketones, ur 15 (*)    Leukocytes, UA SMALL (*)    All other components within normal limits  URINE MICROSCOPIC-ADD ON - Abnormal; Notable for the following:    Squamous Epithelial / LPF FEW (*)    Bacteria, UA FEW (*)    All other components within normal limits   Ct Abdomen Pelvis Wo Contrast  11/11/2011  *RADIOLOGY REPORT*  Clinical Data: Left flank pain.  Left lower quadrant pain.  No hematuria.  CT ABDOMEN AND PELVIS WITHOUT CONTRAST  Technique:  Multidetector CT imaging of the abdomen and pelvis was performed following the standard protocol without intravenous contrast.  Comparison: 07/27/1989  Findings: The lung bases are clear.  The kidneys appear symmetrical and sinus.  No pyelocaliectasis or ureterectasis.  No renal, ureteral, or bladder stones visualized.  Focal vague low attenuation lesion in the anterior midportion of the  left kidney measuring about 2 cm in diameter.  This appears to been present without significant change since the previous study and probably represents a cyst.  However, density measurements are indeterminate and ultrasound or contrast enhanced CT is recommended to exclude a solid mass.  Calcifications scattered throughout the pancreas are increased since the prior study.  The appearance is consistent with chronic pancreatitis.  No evidence of peripancreatic infiltration or fluid to suggest acute pancreatitis. The liver is enlarged with low attenuation suggesting fatty infiltration.  Spleen size is normal. The gallbladder, adrenal  glands, abdominal aorta, and retroperitoneal lymph nodes are unremarkable.  The stomach and small bowel are decompressed.  Gas and stool in the colon without distension or wall thickening.  Postoperative changes in the lower abdominal wall.  Pelvis:  The uterus and adnexal structures are not enlarged. Bladder wall is not thickened.  No free or loculated pelvic fluid collections.  Small fat containing left inguinal hernia.  Normal appendix.  No inflammatory changes in the sigmoid colon. Degenerative changes in the lumbar spine.  IMPRESSION: No renal or ureteral stones or obstructions.  Indeterminate low attenuation lesion in the left kidney.  Contrast enhanced CT or ultrasound recommended to differentiate solid versus cystic lesion. Pancreatic calcification consist with chronic pancreatitis. Diffuse fatty infiltration of the liver.  No acute inflammatory process demonstrated.  Original Report Authenticated By: Marlon Pel, M.D.     1. UTI (lower urinary tract infection)   2. Flank pain    MDM  60yF with L flank pain. Consider renal colic, uti, aaa, musculoskeletal. Abdominal exam benign. CT relatively unremarkable and non-suggestive. Will tx for UTI based on symptoms and UA suggestive. Low suspicion for surgical abdomen. Plan course of abx and outpt fu as needed. Strict return precautions discussed.        Raeford Razor, MD 11/11/11 [id number]

## 2011-11-11 NOTE — ED Notes (Signed)
The pt is still waiting on a doctor

## 2011-11-11 NOTE — ED Notes (Signed)
The pt  Is feeling some better.  Her pain is still there but she has stopped crying at present and appears more relaxed.  Dr Juleen China has ordered a c-t

## 2011-11-11 NOTE — ED Notes (Signed)
Pt crying upset that she had not had anything for pain threatening to walk out.  Dr Anitra Lauth   Gave orders for pain med until dr Juleen China is free

## 2011-11-11 NOTE — ED Notes (Signed)
The pt appears to be comfortable.  Rocephin iv hung

## 2011-11-11 NOTE — ED Notes (Signed)
Pt to ct 

## 2011-11-11 NOTE — ED Notes (Signed)
C/o intermittent left flank & LLQ pain but has increased & been constant since last night. +Dysuria & frequency x 1 week. Denies hematuria. + nausea, no vomiting

## 2013-12-21 ENCOUNTER — Encounter (HOSPITAL_BASED_OUTPATIENT_CLINIC_OR_DEPARTMENT_OTHER): Payer: Medicare Other | Attending: General Surgery

## 2013-12-21 DIAGNOSIS — Z79899 Other long term (current) drug therapy: Secondary | ICD-10-CM | POA: Insufficient documentation

## 2013-12-21 DIAGNOSIS — E1169 Type 2 diabetes mellitus with other specified complication: Secondary | ICD-10-CM | POA: Insufficient documentation

## 2013-12-21 DIAGNOSIS — I10 Essential (primary) hypertension: Secondary | ICD-10-CM | POA: Insufficient documentation

## 2013-12-21 DIAGNOSIS — G589 Mononeuropathy, unspecified: Secondary | ICD-10-CM | POA: Insufficient documentation

## 2013-12-21 DIAGNOSIS — E669 Obesity, unspecified: Secondary | ICD-10-CM | POA: Insufficient documentation

## 2013-12-21 DIAGNOSIS — J45909 Unspecified asthma, uncomplicated: Secondary | ICD-10-CM | POA: Insufficient documentation

## 2013-12-21 DIAGNOSIS — L97509 Non-pressure chronic ulcer of other part of unspecified foot with unspecified severity: Secondary | ICD-10-CM | POA: Insufficient documentation

## 2013-12-22 NOTE — Progress Notes (Signed)
[facility]  NAME:  [name], Yesenia             ACCOUNT NO.:  [id number]  MEDICAL RECORD NO.:  [id number]      DATE OF BIRTH:  1951-08-25  PHYSICIAN:  Ardath SaxPeter Annalea Alguire, M.D.           VISIT DATE:                                  OFFICE VISIT   This is a 63 year old obese female, type 2 diabetic with hypertension and neuropathy.  She also says she has fibromyalgia and asthma.  She is on lisinopril for her hypertension.  She is on Proventil nebulizers. She is on Cymbalta and vitamins, and she presently is on Augmentin because of the ulcer on her left foot, which I am classify and as a Wagner 2.  She has a very small ulcer and it is only about 5 mm x 5 mm. It is on the plantar aspect of her right foot.  She says this has been present for about 3 weeks and she has not had 1 there before, however, she has had 2 amputations on her right foot of her toes and 1 toe removed from the left foot.  She has a blood pressure of 155/80, pulse 62, temperature 98, she weighs 241 pounds.  She does state however that she has lost 60 pounds in the last 6 months.  I am going to classify this ulcer on her right foot as a Wagner 2.  She has got an excellent pulse by the way both the dorsalis pedis and the posterior tibial, so we will treat her today with collagen and we drew up some felt padding to offload this part of her foot, and we will see her back in a week.  So her diagnoses is diabetic foot ulcer right foot, Wagner 2, obesity, diabetes, hypertension, and asthma.     Ardath SaxPeter Junie Engram, M.D.     PP/MEDQ  D:  12/21/2013  T:  12/22/2013  Job:  [id number]

## 2014-01-11 ENCOUNTER — Encounter (HOSPITAL_BASED_OUTPATIENT_CLINIC_OR_DEPARTMENT_OTHER): Payer: Medicare Other | Attending: General Surgery

## 2014-01-11 DIAGNOSIS — E1169 Type 2 diabetes mellitus with other specified complication: Secondary | ICD-10-CM | POA: Insufficient documentation

## 2014-01-11 DIAGNOSIS — L97409 Non-pressure chronic ulcer of unspecified heel and midfoot with unspecified severity: Secondary | ICD-10-CM | POA: Insufficient documentation

## 2014-10-16 ENCOUNTER — Other Ambulatory Visit: Payer: Self-pay | Admitting: Family Medicine

## 2014-10-16 ENCOUNTER — Other Ambulatory Visit (HOSPITAL_COMMUNITY)
Admission: RE | Admit: 2014-10-16 | Discharge: 2014-10-16 | Disposition: A | Discharge status: Home / Self Care | Payer: Medicare Other | Source: Ambulatory Visit | Attending: Family Medicine | Admitting: Family Medicine

## 2014-10-16 DIAGNOSIS — Z01419 Encounter for gynecological examination (general) (routine) without abnormal findings: Secondary | ICD-10-CM | POA: Diagnosis present

## 2014-10-17 LAB — CYTOLOGY - PAP

## 2015-11-21 ENCOUNTER — Institutional Professional Consult (permissible substitution): Payer: Self-pay | Admitting: Pulmonary Disease

## 2015-12-25 ENCOUNTER — Encounter: Payer: Self-pay | Admitting: Pulmonary Disease

## 2015-12-25 ENCOUNTER — Other Ambulatory Visit (INDEPENDENT_AMBULATORY_CARE_PROVIDER_SITE_OTHER): Payer: Medicare Other

## 2015-12-25 ENCOUNTER — Ambulatory Visit (INDEPENDENT_AMBULATORY_CARE_PROVIDER_SITE_OTHER)
Admission: RE | Admit: 2015-12-25 | Discharge: 2015-12-25 | Disposition: A | Discharge status: Home / Self Care | Payer: Medicare Other | Source: Ambulatory Visit | Attending: Pulmonary Disease | Admitting: Pulmonary Disease

## 2015-12-25 ENCOUNTER — Ambulatory Visit (INDEPENDENT_AMBULATORY_CARE_PROVIDER_SITE_OTHER): Payer: Medicare Other | Admitting: Pulmonary Disease

## 2015-12-25 VITALS — BP 144/88 | HR 84 | Ht 63.0 in | Wt 246.2 lb

## 2015-12-25 DIAGNOSIS — J45909 Unspecified asthma, uncomplicated: Secondary | ICD-10-CM

## 2015-12-25 LAB — CBC WITH DIFFERENTIAL/PLATELET
Basophils Absolute: 0 10*3/uL (ref 0.0–0.1)
Basophils Relative: 0.1 % (ref 0.0–3.0)
Eosinophils Absolute: 0.4 10*3/uL (ref 0.0–0.7)
Eosinophils Relative: 3.6 % (ref 0.0–5.0)
HCT: 43.7 % (ref 36.0–46.0)
Hemoglobin: 14.9 g/dL (ref 12.0–15.0)
Lymphocytes Relative: 20.7 % (ref 12.0–46.0)
Lymphs Abs: 2.1 10*3/uL (ref 0.7–4.0)
MCHC: 34 g/dL (ref 30.0–36.0)
MCV: 90.3 fl (ref 78.0–100.0)
Monocytes Absolute: 0.5 10*3/uL (ref 0.1–1.0)
Monocytes Relative: 5.2 % (ref 3.0–12.0)
Neutro Abs: 7.2 10*3/uL (ref 1.4–7.7)
Neutrophils Relative %: 70.4 % (ref 43.0–77.0)
Platelets: 242 10*3/uL (ref 150.0–400.0)
RBC: 4.84 Mil/uL (ref 3.87–5.11)
RDW: 13.6 % (ref 11.5–15.5)
WBC: 10.2 10*3/uL (ref 4.0–10.5)

## 2015-12-25 MED ORDER — DOXYCYCLINE HYCLATE 100 MG PO TABS: 100.0000 mg | ORAL_TABLET | Freq: Two times a day (BID) | ORAL | Status: DC

## 2015-12-25 MED ORDER — PREDNISONE 10 MG PO TABS: ORAL_TABLET | ORAL | Status: DC

## 2015-12-25 NOTE — Progress Notes (Signed)
Subjective:    Patient ID: Yesenia Payne, female    DOB: 03-07-1951, 65 y.o.   MRN: [id number]  HPI Consult for evaluation of asthmatic bronchitis.  Yesenia Payne is a 65 year old with a diagnosis of asthma. She had several episodes of exacerbations for the past few months. In November she was treated with amoxicillin with some improvement but then symptoms recurred. She was then put on Augmentin and a prednisone taper. She was last seen in December with recurrence of symptoms and was given another prednisone taper and Levaquin. She was supposed to get a chest x-ray but could not make the appointment.  She improved untill 2 weeks ago when she was exposed to some smoke from burning leaves which exacerbated her symptoms. Her chief complaint today in the office is sinus pressure, nasal discharge, postnasal drip, chest congestion with cough, white sputum, wheezing. She denies any fevers, chills, hemoptysis.  She's had a diagnosis of asthmatic bronchitis. His symptoms are generally exacerbated by change of weather, exposure to burning smoke. She does not have any allergies to pets, pollen, dust or dander. She is on Advair 250/50 for many years which she says helps with her symptoms. She is also using albuterol rescue inhaler.  Social history: She is a never smoker. No alcohol or illegal drug use.  Family history: Father-allergies, asthma, heart disease.  Past medical history: Asthmatic bronchitis, diabetes mellitus, dyslipidemia, diverticulosis, hypertension, low back pain, IBS, lipoma, glaucoma, fibromyalgia, osteoarthritis, acid reflux, sinusitis, neuropathy, urinary incontinence, headache.  Meds: Crestor, Lyrica, Cymbalta, Voltaren, vitamin D, Trusopt, Advair 250/50, Imodium, Lantus, Humalog, metformin, Coreg, lisinopril, amlodipine, Lasix, Protonix 40 mg once daily, Flonase.  Allergies: IV dye  Review of Systems Cough, white sputum, wheezing, chest congestion, sinusitis, postnasal  drip. Positive for GERD, reflux. No nausea, vomiting, diarrhea, constipation. No chest pain, palpitation. No fevers, chills, malaise, loss of weight, loss of appetite. All other review of systems are negative    Objective:   Physical Exam Blood pressure 144/88, pulse 84, height  (1.6 m), weight 246 lb 3.2 oz (111.676 kg), SpO2 96 %. Gen: No apparent distress Neuro: No gross focal deficits. Neck: No JVD, lymphadenopathy, thyromegaly. RS: Clear, no wheeze, crackles. CVS: S1-S2 heard, no murmurs rubs gallops. Abdomen: Soft, positive bowel sounds. Extremities: No edema.     Assessment & Plan:  Asthmatic bronchitis.  She's had several exacerbations over the past few months requiring multiple courses of antibiotics and prednisone. She comes today to clinic another exacerbation that was set off by exposure to burning leaf smoke.  I will evaluate by getting a chest x-ray today. She will be started on another course of antibiotic with doxycycline for 7 days and short prednisone taper starting at 40 mg for a week. I'll increase her Advair to 500/50 and get CBC with diff to look for eosinophilia and IgE levels. She will get pulmonary function tests and exhaled FeNO when this acute exacerbation has subsided.  She has significant issues with sinusitis, postnasal drip and GERD which are likely exacerbating her asthma. She is currently on Nexium 40 mg a day and Flonase nasal spray. If she continues to have frequent exacerbations then we may need to address these issues more aggressively.  PLAN: - CXR, CBC with diff and IgE - Doxy 100 mg bid for 7 days - Pred taper starting at 40 mg. Reduce dose by 10 mg every 2 days.  - Increase advair to 500/50 - Schedule for PFTs.  Return to clinic in 1  month.   Chilton Greathouse MD [facility] Pager [phone] If no answer or after 3pm call: [phone] 12/25/2015, 3:32 PM

## 2015-12-25 NOTE — Patient Instructions (Signed)
We will schedule you for lung function tests and a chest x-ray. He'll be prescribed doxycycline 100 mg twice daily for 7 days and a prednisone taper starting at 40 mg a day. Please reduce dose by 10 mg every 2 days. We will also check lab work including a blood count with differential, IgE levels and increase Advair dose to 500/50.  Return to clinic in 1-2 months.

## 2015-12-26 LAB — IGE: IgE (Immunoglobulin E), Serum: 268 kU/L — ABNORMAL HIGH (ref <115)

## 2015-12-26 NOTE — Progress Notes (Signed)
Quick Note:  LMOM TCB x1. ______ 

## 2015-12-29 ENCOUNTER — Telehealth: Payer: Self-pay | Admitting: Pulmonary Disease

## 2015-12-29 NOTE — Telephone Encounter (Signed)
Result Note     Please let the pt know that the CXR is normal  --  I spoke with patient about results and she verbalized understanding and had no questions.

## 2016-01-28 ENCOUNTER — Encounter: Payer: Self-pay | Admitting: Pulmonary Disease

## 2016-02-09 ENCOUNTER — Encounter: Payer: Self-pay | Admitting: Internal Medicine

## 2016-02-19 ENCOUNTER — Ambulatory Visit: Payer: Medicare Other | Admitting: Pulmonary Disease

## 2016-05-03 ENCOUNTER — Encounter: Payer: Self-pay | Admitting: Pulmonary Disease

## 2016-05-03 ENCOUNTER — Ambulatory Visit (INDEPENDENT_AMBULATORY_CARE_PROVIDER_SITE_OTHER): Payer: Medicare Other | Admitting: Pulmonary Disease

## 2016-05-03 VITALS — BP 124/68 | HR 74 | Ht 63.25 in | Wt 239.0 lb

## 2016-05-03 DIAGNOSIS — J45909 Unspecified asthma, uncomplicated: Secondary | ICD-10-CM | POA: Diagnosis not present

## 2016-05-03 LAB — PULMONARY FUNCTION TEST
DL/VA % pred: 124 %
DL/VA: 5.84 ml/min/mmHg/L
DLCO cor % pred: 117 %
DLCO cor: 27.41 ml/min/mmHg
DLCO unc % pred: 119 %
DLCO unc: 27.75 ml/min/mmHg
FEF 25-75 Post: 3.29 L/sec
FEF 25-75 Pre: 2.78 L/sec
FEF2575-%Change-Post: 18 %
FEF2575-%Pred-Post: 159 %
FEF2575-%Pred-Pre: 134 %
FEV1-%Change-Post: 3 %
FEV1-%Pred-Post: 108 %
FEV1-%Pred-Pre: 105 %
FEV1-Post: 2.54 L
FEV1-Pre: 2.45 L
FEV1FVC-%Change-Post: 4 %
FEV1FVC-%Pred-Pre: 108 %
FEV6-%Change-Post: 0 %
FEV6-%Pred-Post: 99 %
FEV6-%Pred-Pre: 100 %
FEV6-Post: 2.93 L
FEV6-Pre: 2.95 L
FEV6FVC-%Pred-Post: 104 %
FEV6FVC-%Pred-Pre: 104 %
FVC-%Change-Post: 0 %
FVC-%Pred-Post: 95 %
FVC-%Pred-Pre: 96 %
FVC-Post: 2.93 L
FVC-Pre: 2.95 L
Post FEV1/FVC ratio: 87 %
Post FEV6/FVC ratio: 100 %
Pre FEV1/FVC ratio: 83 %
Pre FEV6/FVC Ratio: 100 %

## 2016-05-03 NOTE — Patient Instructions (Signed)
Continue using the Advair.  Return to clinic in 6 months.

## 2016-05-03 NOTE — Progress Notes (Signed)
   Subjective:    Patient ID: Yesenia Payne, female    DOB: 12/18/1950, 65 y.o.   MRN: [id number]  HPI  Follow up for asthmatic bronchitis.  Yesenia Payne is a 65 year old with a diagnosis of asthma. She had several episodes of exacerbations this year. In November she was treated with amoxicillin with some improvement but then symptoms recurred. She was then put on Augmentin and a prednisone taper. She was last seen in December with recurrence of symptoms and was given another prednisone taper and Levaquin. She was supposed to get a chest x-ray but could not make the appointment.  She improved untill 2 weeks ago when she was exposed to some smoke from burning leaves which exacerbated her symptoms. Her chief complaint today in the office is sinus pressure, nasal discharge, postnasal drip, chest congestion with cough, white sputum, wheezing. She denies any fevers, chills, hemoptysis.  She's had a diagnosis of asthmatic bronchitis. His symptoms are generally exacerbated by change of weather, exposure to burning smoke. She does not have any allergies to pets, pollen, dust or dander. She is on Advair 250/50 for many years which she says helps with her symptoms. She is also using albuterol rescue inhaler.  DATA: Labs IgE 12/25/15- 268 CBC 12/25/15- Absolute eosinophil count 367 FeNo 05/03/16 - 43  PFTs 05/03/16 FVC 2.95 (96%) FEV1 2.45 [105%) F/F 83 TLC 84% DLCO 119% Normal PFTs  CXR 12/25/15 No active cardiopulmonary disease.  Social history: She is a never smoker. No alcohol or illegal drug use.  Family history: Father-allergies, asthma, heart disease.  Past medical history: Asthmatic bronchitis, diabetes mellitus, dyslipidemia, diverticulosis, hypertension, low back pain, IBS, lipoma, glaucoma, fibromyalgia, osteoarthritis, acid reflux, sinusitis, neuropathy, urinary incontinence, headache.  Meds: Crestor, Lyrica, Cymbalta, Voltaren, vitamin D, Trusopt, Advair 250/50, Imodium,  Lantus, Humalog, metformin, Coreg, lisinopril, amlodipine, Lasix, Protonix 40 mg once daily, Flonase.  Allergies: IV dye  Review of Systems Cough, white sputum, wheezing, chest congestion, sinusitis, postnasal drip. Positive for GERD, reflux. No nausea, vomiting, diarrhea, constipation. No chest pain, palpitation. No fevers, chills, malaise, loss of weight, loss of appetite. All other review of systems are negative    Objective:   Physical Exam Blood pressure 124/68, pulse 74, height 5' 3.25" (1.607 m), weight 239 lb (108.41 kg), SpO2 98 %. Gen: No apparent distress Neuro: No gross focal deficits. Neck: No JVD, lymphadenopathy, thyromegaly. RS: Clear, no wheeze, crackles. CVS: S1-S2 heard, no murmurs rubs gallops. Abdomen: Soft, positive bowel sounds. Extremities: No edema.    Assessment & Plan:  Asthmatic bronchitis.  She's had several exacerbations this year requiring multiple courses of antibiotics and prednisone. Surprisingly she has normal PFTs with no significant BD response but she does have elevated IgE, absolute eosinophils and FeNo levels. I think her main issues are with allergies, sinusitis, postnasal drip and GERD which are likely exacerbating her asthma. She is currently on Nexium 40 mg a day and Flonase nasal spray. She will continue the Advair.  She seems stable in clinicl today. We can consider xolair and anti IL5 agents if she continues to have poor control of symptoms.   PLAN: - Continue advair - Continue nexium and flonase.   Return to clinic in 6 month.   Chilton GreathousePraveen Kiwanna Spraker MD [facility] Pager [phone] If no answer or after 3pm call: [phone] 05/03/2016, 2:57 PM

## 2016-05-03 NOTE — Progress Notes (Signed)
PFT done today. 

## 2016-07-30 ENCOUNTER — Telehealth: Payer: Self-pay | Admitting: Pulmonary Disease

## 2016-07-30 MED ORDER — AZITHROMYCIN 250 MG PO TABS: ORAL_TABLET | ORAL | 0 refills | Status: DC

## 2016-07-30 NOTE — Telephone Encounter (Signed)
Spoke with pt's daughter Efraim KaufmannMelissa, aware of recs. rx sent to preferred pharmacy.  Nothing further needed.

## 2016-07-30 NOTE — Telephone Encounter (Signed)
?   Days sx ?  Can have ZPack # 1 take as directed. To have if sx worsen or do not resolve with discolored mucus.  Needs follow up with Dr. Isaiah SergeMannam as planned and As needed   Please contact office for sooner follow up if symptoms do not improve or worsen or seek emergency care

## 2016-07-30 NOTE — Telephone Encounter (Signed)
Spoke with pt and she c/o sinus pain/pressure and chest congestion, cough with yellow mucus and wheeze/ShOB. Pt denies f/n/v. Pt has used albuterol via neb with some improvement. Pt has not taken any OTC medications for symptoms.   PM is not in office. TP - Please advise. Thanks!

## 2016-08-11 ENCOUNTER — Telehealth: Payer: Self-pay | Admitting: Pulmonary Disease

## 2016-08-11 MED ORDER — DOXYCYCLINE HYCLATE 100 MG PO TABS: 100.0000 mg | ORAL_TABLET | Freq: Two times a day (BID) | ORAL | 0 refills | Status: DC

## 2016-08-11 MED ORDER — PREDNISONE 10 MG PO TABS: ORAL_TABLET | ORAL | 0 refills | Status: DC

## 2016-08-11 NOTE — Telephone Encounter (Signed)
Pt aware of results/recs.  rx sent to preferred pharmacy.  Nothing further needed.  

## 2016-08-11 NOTE — Telephone Encounter (Signed)
Spoke with pt. States that she is not feeling any better. Reports sinus congestion, sinus pressure, ear pain, PND and coughing. Cough is producing yellow mucus. Denies chest tightness, wheezing, SOB or fever. Had a Zpack called in last week by TP. Would like another round of antibiotics.  MR - please advise as PM is unavailable this afternoon. Thanks.

## 2016-08-11 NOTE — Telephone Encounter (Signed)
Ok try   Take doxycycline 100mg  po twice daily x 5 days; take after meals and avoid sunlight  And  Please take prednisone 40 mg x1 day, then 30 mg x1 day, then 20 mg x1 day, then 10 mg x1 day, and then 5 mg x1 day and stop  Dr. Kalman ShanMurali Nuria Phebus, M.D., [name] Medicine Staff Physician [facility] Pager: [phone], If no answer or between  15:00h - 7:00h: call [phone]  08/11/2016 5:11 PM

## 2017-01-06 ENCOUNTER — Telehealth: Payer: Self-pay

## 2017-01-06 NOTE — Telephone Encounter (Signed)
SENT NOTES TO SCHEDULING 

## 2017-01-07 ENCOUNTER — Telehealth: Payer: Self-pay

## 2017-01-07 ENCOUNTER — Encounter: Payer: Self-pay | Admitting: Cardiology

## 2017-01-07 NOTE — Telephone Encounter (Signed)
FAXED NOTES TO [facility] 

## 2017-01-11 ENCOUNTER — Telehealth: Payer: Self-pay | Admitting: Cardiology

## 2017-01-11 NOTE — Telephone Encounter (Signed)
Received records from [facility] for appointment on 01/17/17 with Dr SwazilandJordan.  Record put with Dr Elvis CoilJordan's schedule on 01/17/17. lp

## 2017-01-13 IMAGING — DX DG CHEST 2V
2 series · 2 of 2 positions shown · non-contrast
Comparison: Prior chest x-ray 02/17/2011

CLINICAL DATA: 64-year-old female with cough, congestion, and chest
tightness for the past 10 days

EXAM:
CHEST  2 VIEW

[chest pa]
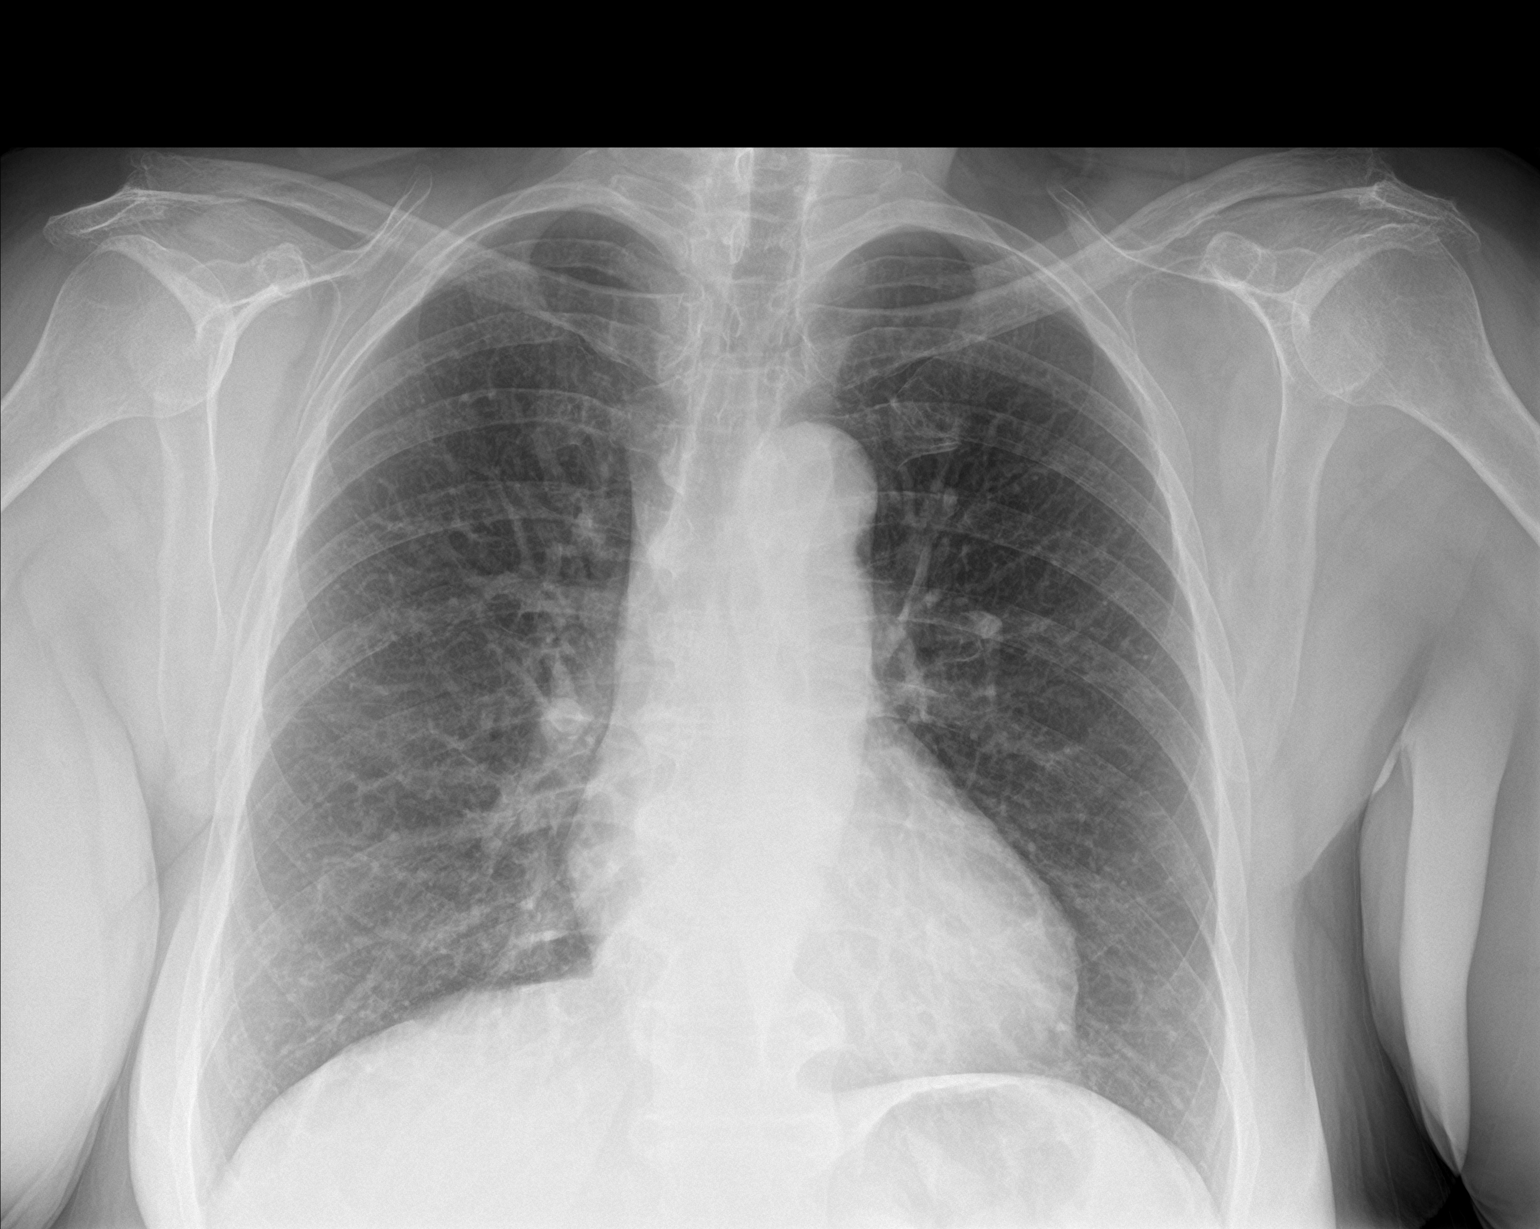

[chest lat]
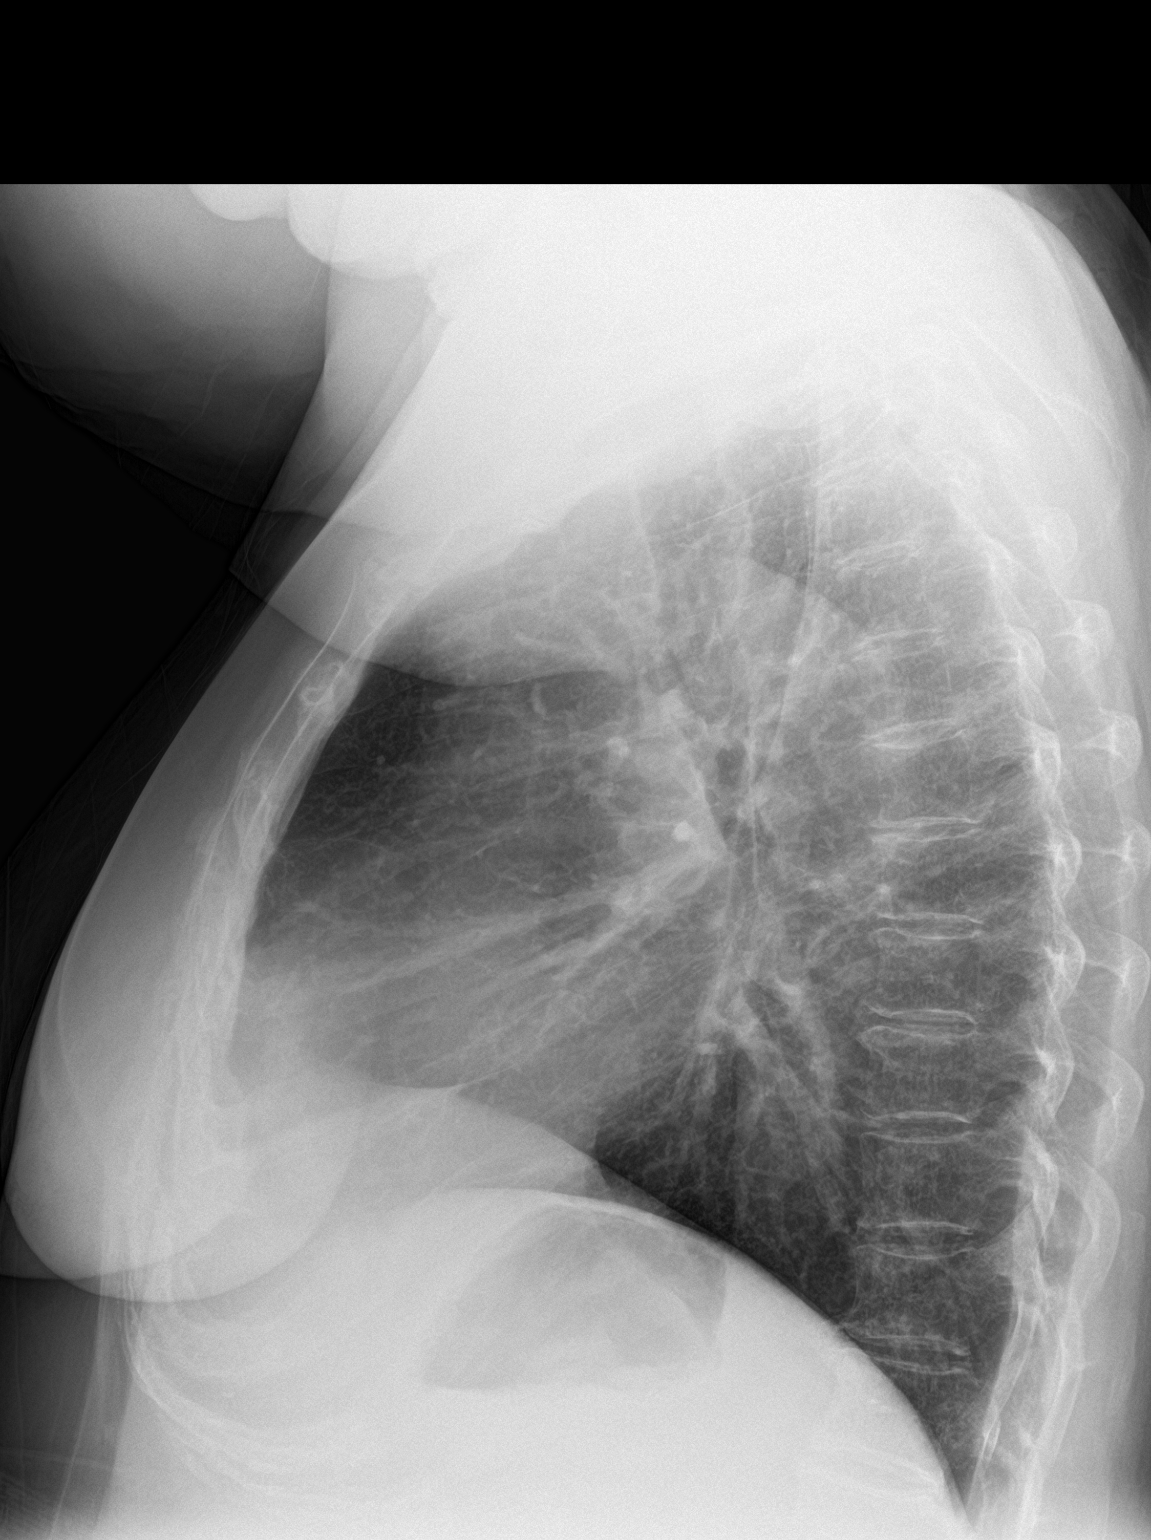

[2 of 2 positions shown; findings below may reference images not displayed]

FINDINGS: Stable cardiac mediastinal contours. Minimal bronchitic changes
similar compared to prior. Inspiratory volumes are normal. No focal
airspace consolidation, pulmonary edema, pleural effusion or
pneumothorax. No suspicious pulmonary mass or nodule. No acute
osseous abnormality.
IMPRESSION: No active cardiopulmonary disease.

## 2017-01-16 NOTE — Progress Notes (Deleted)
Cardiology Office Note    Date:  01/16/2017   ID:  Yesenia Payne, DOB 10/16/1951, MRN [id number]  PCP:  Allean Found, MD  Cardiologist:  Peter Swaziland, MD    History of Present Illness:  Yesenia Payne is a 66 y.o. female seen at the request of Dr. Katrinka Blazing for evaluation of     No past medical history on file.  Past Surgical History:  Procedure Laterality Date  . BACK SURGERY     L4 & L5  . CESAREAN SECTION    . CESAREAN SECTION    . Left Knee Surgery  1993  . Right knee surgery  1990  . TOTAL KNEE ARTHROPLASTY      Current Medications: Outpatient Medications Prior to Visit  Medication Sig Dispense Refill  . albuterol (PROVENTIL HFA;VENTOLIN HFA) 108 (90 BASE) MCG/ACT inhaler Inhale 1 puff into the lungs 2 (two) times daily.      Marland Kitchen albuterol (PROVENTIL) (2.5 MG/3ML) 0.083% nebulizer solution Take 2.5 mg by nebulization 3 (three) times daily as needed. For shortness of breath.     Marland Kitchen amLODipine (NORVASC) 10 MG tablet Take 10 mg by mouth daily.    Marland Kitchen azithromycin (ZITHROMAX) 250 MG tablet Take 2 today, then 1 daily until gone. 6 tablet 0  . carvedilol (COREG CR) 40 MG 24 hr capsule Take 40 mg by mouth daily.    Marland Kitchen desloratadine (CLARINEX) 5 MG tablet Take 10 mg by mouth daily.    . diclofenac sodium (VOLTAREN) 1 % GEL Apply 2 g topically daily.    Marland Kitchen doxycycline (VIBRA-TABS) 100 MG tablet Take 1 tablet (100 mg total) by mouth 2 (two) times daily. 10 tablet 0  . DULoxetine (CYMBALTA) 60 MG capsule Take 60 mg by mouth daily.      . fenofibrate (TRICOR) 145 MG tablet Take 145 mg by mouth 2 (two) times daily.      . Fluticasone-Salmeterol (ADVAIR) 250-50 MCG/DOSE AEPB Inhale 1 puff into the lungs daily.      . furosemide (LASIX) 20 MG tablet Take 20 mg by mouth.    . insulin glargine (LANTUS) 100 UNIT/ML injection Inject 90 Units into the skin 2 (two) times daily.      . insulin lispro (HUMALOG) 100 UNIT/ML injection Inject 92 Units into the skin 2 (two) times daily.       Marland Kitchen lisinopril (PRINIVIL,ZESTRIL) 20 MG tablet Take 20 mg by mouth daily.    . metFORMIN (GLUCOPHAGE) 1000 MG tablet Take 1,000 mg by mouth 2 (two) times daily with a meal.    . oxyCODONE-acetaminophen (PERCOCET) 10-325 MG tablet Take 1 tablet by mouth 4 (four) times daily.    . predniSONE (DELTASONE) 10 MG tablet 40mg  X1 day, 30mg  X1 day, 20mg  X1 day, 10mg  X1 day, 5mg  X1 day. 11 tablet 0  . pregabalin (LYRICA) 300 MG capsule Take 300 mg by mouth 2 (two) times daily.       No facility-administered medications prior to visit.      Allergies:   Iodinated diagnostic agents   Social History   Social History  . Marital status: Married    Spouse name: N/A  . Number of children: N/A  . Years of education: N/A   Social History Main Topics  . Smoking status: Never Smoker  . Smokeless tobacco: Never Used  . Alcohol use No  . Drug use: No  . Sexual activity: Not on file   Other Topics Concern  . Not on file  Social History Narrative  . No narrative on file     Family History:  The patient's ***family history is not on file.   ROS:   Please see the history of present illness.    ROS All other systems reviewed and are negative.   PHYSICAL EXAM:   VS:  There were no vitals taken for this visit.   GEN: Well nourished, well developed, in no acute distress  HEENT: normal  Neck: no JVD, carotid bruits, or masses Cardiac: ***RRR; no murmurs, rubs, or gallops,no edema  Respiratory:  clear to auscultation bilaterally, normal work of breathing GI: soft, nontender, nondistended, + BS MS: no deformity or atrophy  Skin: warm and dry, no rash Neuro:  Alert and Oriented x 3, Strength and sensation are intact Psych: euthymic mood, full affect  Wt Readings from Last 3 Encounters:  05/03/16 239 lb (108.4 kg)  12/25/15 246 lb 3.2 oz (111.7 kg)  11/11/11 250 lb (113.4 kg)      Studies/Labs Reviewed:   EKG:  EKG is*** ordered today.  The ekg ordered today demonstrates ***  Recent  Labs: No results found for requested labs within last 8760 hours.   Lipid Panel No results found for: CHOL, TRIG, HDL, CHOLHDL, VLDL, LDLCALC, LDLDIRECT  Additional studies/ records that were reviewed today include:     ASSESSMENT:    No diagnosis found.   PLAN:  In order of problems listed above:  1. ***    Medication Adjustments/Labs and Tests Ordered: Current medicines are reviewed at length with the patient today.  Concerns regarding medicines are outlined above.  Medication changes, Labs and Tests ordered today are listed in the Patient Instructions below. There are no Patient Instructions on file for this visit.   Signed, Peter SwazilandJordan, MD  01/16/2017 7:57 AM    [facility] [address], [place], [place], [id number] [phone]

## 2017-01-17 ENCOUNTER — Ambulatory Visit: Payer: Medicare Other | Admitting: Cardiology

## 2017-01-18 ENCOUNTER — Encounter: Payer: Self-pay | Admitting: Cardiology

## 2017-01-18 ENCOUNTER — Ambulatory Visit (INDEPENDENT_AMBULATORY_CARE_PROVIDER_SITE_OTHER): Payer: Medicare Other | Admitting: Cardiology

## 2017-01-18 ENCOUNTER — Encounter (INDEPENDENT_AMBULATORY_CARE_PROVIDER_SITE_OTHER): Payer: Self-pay

## 2017-01-18 VITALS — BP 144/86 | HR 83 | Ht 63.25 in | Wt 225.1 lb

## 2017-01-18 DIAGNOSIS — I10 Essential (primary) hypertension: Secondary | ICD-10-CM

## 2017-01-18 DIAGNOSIS — R0600 Dyspnea, unspecified: Secondary | ICD-10-CM | POA: Insufficient documentation

## 2017-01-18 DIAGNOSIS — I491 Atrial premature depolarization: Secondary | ICD-10-CM

## 2017-01-18 DIAGNOSIS — R0609 Other forms of dyspnea: Secondary | ICD-10-CM | POA: Insufficient documentation

## 2017-01-18 HISTORY — DX: Atrial premature depolarization: I49.1

## 2017-01-18 MED ORDER — METOPROLOL SUCCINATE ER 50 MG PO TB24: 50.0000 mg | ORAL_TABLET | Freq: Every day | ORAL | 3 refills | Status: DC

## 2017-01-18 NOTE — Progress Notes (Signed)
Cardiology Office Note    Date:  01/18/2017   ID:  Yesenia MaidMargaret D Andes, DOB 11/01/1951, MRN [id number]  PCP:  Allean FoundSMITH,CANDACE THIELE, MD  Cardiologist:  Armanda Magicraci Turner, MD   Chief Complaint  Patient presents with  . Shortness of Breath    History of Present Illness:  Yesenia Payne is a 66 y.o. female with a history of GERD, asthma, DM, dyslipidemia, HTN who is referred for evaluation of SOB and frequent PACs noted on EKG.  She says that since the summer she has been having SOB and DOE.  She has a lot of allergies and frequently gets sinus infections, bronchitis and PNA.  She had PFTs 04/2016 that were normal.  She had an EKG done at the office showing PACs.  Her PCP placed her on carvedilol for HTN and has not been feeling well since then.  She has been on it for the past year.  She says that that was when some of the SOB started.  She denies any PND or orthopnea.  She does not sleep well due to fibromyalgia and severe neuropathic pain.  She denies any chest pain or pressure.  Sometimes she will notice some skipping of her heart beat. She has significant DOE and sometimes it will be so bad that she will break out in a sweat if she does not sit down.  She has some LE edema on occasion.  She denies any dizziness or syncope.  She denies any awakening gasping for breath.  She does feel fatigued during the day because she only sleeps 3 hours on a good night due to severe pain.      Past Medical History:  Diagnosis Date  . Acid reflux   . Asthma   . Chronic headache   . Diabetes mellitus without complication (HCC)   . Diverticular disease   . Diverticulitis   . Dyslipidemia   . Fibromyalgia   . Hyperlipidemia   . Hypertension   . IBS (irritable bowel syndrome)   . Insomnia   . Irregular heartbeat   . Low back pain   . Morbid obesity (HCC)   . Neuropathy (HCC)    distal peripheral  . Osteoarthritis   . Urinary incontinence   . Vitamin D deficiency     Past Surgical History:    Procedure Laterality Date  . arthoscopics    . BACK SURGERY     L4 & L5  . CESAREAN SECTION    . CESAREAN SECTION    . colonscopy  2009  . Left Knee Surgery  1993  . Right knee surgery  1990  . TOTAL KNEE ARTHROPLASTY      Current Medications: Current Meds  Medication Sig  . albuterol (PROVENTIL HFA;VENTOLIN HFA) 108 (90 BASE) MCG/ACT inhaler Inhale 1 puff into the lungs 2 (two) times daily.    Marland Kitchen. albuterol (PROVENTIL) (2.5 MG/3ML) 0.083% nebulizer solution Take 2.5 mg by nebulization 3 (three) times daily as needed. For shortness of breath.   Marland Kitchen. amLODipine (NORVASC) 10 MG tablet Take 10 mg by mouth daily.  . carvedilol (COREG CR) 40 MG 24 hr capsule Take 40 mg by mouth daily.  . Cholecalciferol (VITAMIN D PO) Take 4,000 Units by mouth daily.  Marland Kitchen. desloratadine (CLARINEX) 5 MG tablet Take 10 mg by mouth daily.  . diclofenac sodium (VOLTAREN) 1 % GEL Apply 2 g topically daily.  . DULoxetine (CYMBALTA) 60 MG capsule Take 60 mg by mouth daily.    . fenofibrate (TRICOR)  145 MG tablet Take 145 mg by mouth 2 (two) times daily.    . Fluticasone-Salmeterol (ADVAIR) 250-50 MCG/DOSE AEPB Inhale 1 puff into the lungs daily.    . furosemide (LASIX) 20 MG tablet Take 20 mg by mouth.  . gabapentin (NEURONTIN) 800 MG tablet Take 800 mg by mouth 4 (four) times daily.   . insulin glargine (LANTUS) 100 UNIT/ML injection Inject 90 Units into the skin 2 (two) times daily.    . insulin lispro (HUMALOG) 100 UNIT/ML injection Inject 92 Units into the skin 2 (two) times daily.    Marland Kitchen lisinopril (PRINIVIL,ZESTRIL) 20 MG tablet Take 20 mg by mouth daily.  . metFORMIN (GLUCOPHAGE) 1000 MG tablet Take 1,000 mg by mouth 2 (two) times daily with a meal.  . oxyCODONE-acetaminophen (PERCOCET) 10-325 MG tablet Take 1 tablet by mouth 4 (four) times daily.  Marland Kitchen POTASSIUM PO Take 1 tablet by mouth daily.    Allergies:   Iodinated diagnostic agents   Social History   Social History  . Marital status: Married    Spouse  name: N/A  . Number of children: N/A  . Years of education: N/A   Social History Main Topics  . Smoking status: Never Smoker  . Smokeless tobacco: Never Used  . Alcohol use No  . Drug use: No  . Sexual activity: No   Other Topics Concern  . None   Social History Narrative  . None     Family History:  The patient's family history includes CAD in her father; Cancer - Other in her sister; Diabetes in her father and paternal grandfather; Heart attack in her father and sister; Hyperlipidemia in her mother; Hypertension in her mother; Leukemia in her sister; Mental retardation in her brother; Neuropathy in her father; Other in her daughter.   ROS:   Please see the history of present illness.    ROS All other systems reviewed and are negative.  No flowsheet data found.     PHYSICAL EXAM:   VS:  BP (!) 144/86   Pulse 83   Ht 5' 3.25" (1.607 m)   Wt 225 lb 1.9 oz (102.1 kg)   SpO2 95%   BMI 39.56 kg/m    GEN: Well nourished, well developed, in no acute distress  HEENT: normal  Neck: no JVD, carotid bruits, or masses Cardiac: RRR; no murmurs, rubs, or gallops,no edema.  Intact distal pulses bilaterally.  Respiratory:  clear to auscultation bilaterally, normal work of breathing GI: soft, nontender, nondistended, + BS MS: no deformity or atrophy  Skin: warm and dry, no rash Neuro:  Alert and Oriented x 3, Strength and sensation are intact Psych: euthymic mood, full affect  Wt Readings from Last 3 Encounters:  01/18/17 225 lb 1.9 oz (102.1 kg)  05/03/16 239 lb (108.4 kg)  12/25/15 246 lb 3.2 oz (111.7 kg)      Studies/Labs Reviewed:   EKG:  EKG is ordered today and showed NSR with PACs and PVCs  Recent Labs: No results found for requested labs within last 8760 hours.   Lipid Panel No results found for: CHOL, TRIG, HDL, CHOLHDL, VLDL, LDLCALC, LDLDIRECT  Additional studies/ records that were reviewed today include:  Office visit notes    ASSESSMENT:    No  diagnosis found.   PLAN:  In order of problems listed above:  1. DOE - this is likely multifactorial from morbid obesity, sedentary state, beta nonselective BB in setting of asthma and probable diastolic dysfunction.  She has  a family history of CAD in multiple family members, so also need to consider CAD.  EKG at PCP office showed NSR with PACs and no ST changes.   I will get a Lexiscan myoview to rule out ischemia and 2D echo to assess LVF and diastolic function.  2. PAC's - I reassured her that these are benign and she is asymptomatic.  We discussed triggers such as lack of sleep, caffeine.  She does not drink ETOH.  I will check a BMET, CBC and TSH.  3.   HTN - BP borderline controlled - she does not have COPD but has has asthma and her SOB started after starting Coreg which is a nonselective beta blocker for HTN.  Recommend stopping Coreg and starting Toprol XL 50mg  daily.  I will have her followup in HTN clinic in 1 week.     Medication Adjustments/Labs and Tests Ordered: Current medicines are reviewed at length with the patient today.  Concerns regarding medicines are outlined above.  Medication changes, Labs and Tests ordered today are listed in the Patient Instructions below.  There are no Patient Instructions on file for this visit.   Signed, Armanda Magic, MD  01/18/2017 1:24 PM    [facility] [address] [place], [place], [place]  [address] Phone: [phone]; Fax: [phone]

## 2017-01-18 NOTE — Patient Instructions (Addendum)
Medication Instructions:  1) STOP COREG 2) START TOPROL XL 50 mg daily   Labwork: TODAY: BMET, CBC, TSH  Testing/Procedures: None  Follow-Up: Your physician recommends that you schedule a follow-up appointment in ONE WEEK in the HTN CLINIC.  Your physician recommends that you schedule a follow-up appointment AS NEEDED with Dr. Mayford Knifeurner pending lab results.  Any Other Special Instructions Will Be Listed Below (If Applicable).     If you need a refill on your cardiac medications before your next appointment, please call your pharmacy.

## 2017-01-19 LAB — CBC WITH DIFFERENTIAL/PLATELET
Basophils Absolute: 0.1 10*3/uL (ref 0.0–0.2)
Basos: 1 %
EOS (ABSOLUTE): 0.3 10*3/uL (ref 0.0–0.4)
Eos: 2 %
Hematocrit: 43.3 % (ref 34.0–46.6)
Hemoglobin: 14.9 g/dL (ref 11.1–15.9)
Immature Grans (Abs): 0.1 10*3/uL (ref 0.0–0.1)
Immature Granulocytes: 1 %
Lymphocytes Absolute: 2.6 10*3/uL (ref 0.7–3.1)
Lymphs: 22 %
MCH: 30.8 pg (ref 26.6–33.0)
MCHC: 34.4 g/dL (ref 31.5–35.7)
MCV: 90 fL (ref 79–97)
Monocytes Absolute: 0.7 10*3/uL (ref 0.1–0.9)
Monocytes: 6 %
Neutrophils Absolute: 8 10*3/uL — ABNORMAL HIGH (ref 1.4–7.0)
Neutrophils: 68 %
Platelets: 289 10*3/uL (ref 150–379)
RBC: 4.83 x10E6/uL (ref 3.77–5.28)
RDW: 13.4 % (ref 12.3–15.4)
WBC: 11.7 10*3/uL — ABNORMAL HIGH (ref 3.4–10.8)

## 2017-01-19 LAB — BASIC METABOLIC PANEL
BUN/Creatinine Ratio: 13 (ref 12–28)
BUN: 9 mg/dL (ref 8–27)
CO2: 25 mmol/L (ref 18–29)
Calcium: 9.7 mg/dL (ref 8.7–10.3)
Chloride: 95 mmol/L — ABNORMAL LOW (ref 96–106)
Creatinine, Ser: 0.69 mg/dL (ref 0.57–1.00)
GFR calc Af Amer: 105 mL/min/{1.73_m2} (ref >59)
GFR calc non Af Amer: 91 mL/min/{1.73_m2} (ref >59)
Glucose: 233 mg/dL — ABNORMAL HIGH (ref 65–99)
Potassium: 4.2 mmol/L (ref 3.5–5.2)
Sodium: 140 mmol/L (ref 134–144)

## 2017-01-19 LAB — TSH: TSH: 3.08 u[IU]/mL (ref 0.450–4.500)

## 2017-01-25 ENCOUNTER — Ambulatory Visit (INDEPENDENT_AMBULATORY_CARE_PROVIDER_SITE_OTHER): Payer: Medicare Other | Admitting: Pharmacist

## 2017-01-25 VITALS — BP 122/80 | HR 57 | Wt 228.5 lb

## 2017-01-25 DIAGNOSIS — I10 Essential (primary) hypertension: Secondary | ICD-10-CM

## 2017-01-25 NOTE — Progress Notes (Signed)
Patient ID: Yesenia Payne                 DOB: [birth year]                      MRN: [id number]     HPI: SHARA HARTIS is a 66 y.o. female patient of Dr. Mayford Knife who presents today for hypertension evaluation. PMH includes GERD, asthma, DM, dyslipidemia, family history of CAD, and HTN. At her most recent visit with Dr. Mayford Knife, her blood pressure was borderline controlled and her carvedilol was changed to metoprolol XL as she was experiencing some SOB and has comorbidity of asthma.   Today she presents and states that her SOB is much improved. She has been doing well on her current regimen.    Current HTN meds:  Metoprolol succinate 50mg  daily in the morning Lisinopril 20mg  daily in the morning Furosemide 20mg  daily usually 3 times a week Amlodipine 10mg  daily in the morning  Previously tried:  Carvedilol - increased SOB  BP goal: <130/80  Family History: CAD, DM, MI in father. MI in sister  Social History: Denies tobacco products and alcohol.   Diet: Eats most meal from home. Avoids salt. Drinks decaf drinks mostly.   Exercise: limited due to balance.   Home BP readings: 120-130s/80s  Wt Readings from Last 3 Encounters:  01/25/17 228 lb 8 oz (103.6 kg)  01/18/17 225 lb 1.9 oz (102.1 kg)  05/03/16 239 lb (108.4 kg)   BP Readings from Last 3 Encounters:  01/25/17 122/80  01/18/17 (!) 144/86  05/03/16 124/68   Pulse Readings from Last 3 Encounters:  01/25/17 (!) 57  01/18/17 83  05/03/16 74    Renal function: Estimated Creatinine Clearance: 79.9 mL/min (by C-G formula based on SCr of 0.69 mg/dL).  Past Medical History:  Diagnosis Date  . Acid reflux   . Asthma   . Chronic headache   . Diabetes mellitus without complication (HCC)   . Diverticular disease   . Diverticulitis   . Dyslipidemia   . Fibromyalgia   . Hyperlipidemia   . Hypertension   . IBS (irritable bowel syndrome)   . Insomnia   . Irregular heartbeat   . Low back pain   . Morbid obesity  (HCC)   . Neuropathy (HCC)    distal peripheral  . Osteoarthritis   . PAC (premature atrial contraction) 01/18/2017  . Urinary incontinence   . Vitamin D deficiency     Current Outpatient Prescriptions on File Prior to Visit  Medication Sig Dispense Refill  . albuterol (PROVENTIL HFA;VENTOLIN HFA) 108 (90 BASE) MCG/ACT inhaler Inhale 1 puff into the lungs 2 (two) times daily.      Marland Kitchen albuterol (PROVENTIL) (2.5 MG/3ML) 0.083% nebulizer solution Take 2.5 mg by nebulization 3 (three) times daily as needed. For shortness of breath.     Marland Kitchen amLODipine (NORVASC) 10 MG tablet Take 10 mg by mouth daily.    . Cholecalciferol (VITAMIN D PO) Take 4,000 Units by mouth daily.    Marland Kitchen desloratadine (CLARINEX) 5 MG tablet Take 10 mg by mouth daily as needed.     . diclofenac sodium (VOLTAREN) 1 % GEL Apply 2 g topically daily.    . DULoxetine (CYMBALTA) 60 MG capsule Take 60 mg by mouth daily.      . fenofibrate (TRICOR) 145 MG tablet Take 145 mg by mouth 2 (two) times daily.      . Fluticasone-Salmeterol (ADVAIR) 250-50 MCG/DOSE AEPB  Inhale 1 puff into the lungs daily.      . furosemide (LASIX) 20 MG tablet Take 20 mg by mouth daily as needed.     . gabapentin (NEURONTIN) 800 MG tablet Take 800 mg by mouth 4 (four) times daily.     . insulin glargine (LANTUS) 100 UNIT/ML injection Inject 90 Units into the skin daily.     . insulin lispro (HUMALOG) 100 UNIT/ML injection Inject 47 Units into the skin 3 (three) times daily with meals.     Marland Kitchen. lisinopril (PRINIVIL,ZESTRIL) 20 MG tablet Take 20 mg by mouth daily.    . metFORMIN (GLUCOPHAGE) 1000 MG tablet Take 1,000 mg by mouth 2 (two) times daily with a meal.    . metoprolol succinate (TOPROL-XL) 50 MG 24 hr tablet Take 1 tablet (50 mg total) by mouth daily. Take with or immediately following a meal. 90 tablet 3  . oxyCODONE-acetaminophen (PERCOCET) 10-325 MG tablet Take 1 tablet by mouth 4 (four) times daily.    Marland Kitchen. POTASSIUM PO Take 1 tablet by mouth daily.      No current facility-administered medications on file prior to visit.     Allergies  Allergen Reactions  . Iodinated Diagnostic Agents Hives    Blood pressure 122/80, pulse (!) 57, weight 228 lb 8 oz (103.6 kg), SpO2 97 %.   Assessment/Plan: Hypertension: BP well controlled. Continue all medications as prescribed. Follow up with Dr. Mayford Knifeurner as scheduled and HTN clinic as needed.    Thank you, Freddie ApleyKelley M. Cleatis PolkaAuten, PharmD  [facility]

## 2017-01-25 NOTE — Patient Instructions (Addendum)
Return for a follow up appointment as indicated with Dr. Mayford Knifeurner  Check your blood pressure at home daily (if able) and keep record of the readings.  Take your BP meds as follows: Continue all medications as prescribed.   Bring all of your meds, your BP cuff and your record of home blood pressures to your next appointment.  Exercise as you're able, try to walk approximately 30 minutes per day.  Keep salt intake to a minimum, especially watch canned and prepared boxed foods.  Eat more fresh fruits and vegetables and fewer canned items.  Avoid eating in fast food restaurants.    HOW TO TAKE YOUR BLOOD PRESSURE: . Rest 5 minutes before taking your blood pressure. .  Don't smoke or drink caffeinated beverages for at least 30 minutes before. . Take your blood pressure before (not after) you eat. . Sit comfortably with your back supported and both feet on the floor (don't cross your legs). . Elevate your arm to heart level on a table or a desk. . Use the proper sized cuff. It should fit smoothly and snugly around your bare upper arm. There should be enough room to slip a fingertip under the cuff. The bottom edge of the cuff should be 1 inch above the crease of the elbow. . Ideally, take 3 measurements at one sitting and record the average.

## 2017-01-31 ENCOUNTER — Encounter: Payer: Self-pay | Admitting: Pharmacist

## 2019-04-06 ENCOUNTER — Other Ambulatory Visit: Payer: Self-pay

## 2019-04-06 ENCOUNTER — Encounter (HOSPITAL_BASED_OUTPATIENT_CLINIC_OR_DEPARTMENT_OTHER): Payer: Medicare Other

## 2019-04-06 DIAGNOSIS — Z794 Long term (current) use of insulin: Secondary | ICD-10-CM | POA: Insufficient documentation

## 2019-04-06 DIAGNOSIS — Z89411 Acquired absence of right great toe: Secondary | ICD-10-CM | POA: Diagnosis not present

## 2019-04-06 DIAGNOSIS — E1142 Type 2 diabetes mellitus with diabetic polyneuropathy: Secondary | ICD-10-CM | POA: Insufficient documentation

## 2019-04-06 DIAGNOSIS — I10 Essential (primary) hypertension: Secondary | ICD-10-CM | POA: Diagnosis not present

## 2019-04-06 DIAGNOSIS — E11621 Type 2 diabetes mellitus with foot ulcer: Secondary | ICD-10-CM | POA: Insufficient documentation

## 2019-04-06 DIAGNOSIS — Z89422 Acquired absence of other left toe(s): Secondary | ICD-10-CM | POA: Diagnosis not present

## 2019-04-06 DIAGNOSIS — L97512 Non-pressure chronic ulcer of other part of right foot with fat layer exposed: Secondary | ICD-10-CM | POA: Diagnosis not present

## 2019-04-06 DIAGNOSIS — Z89421 Acquired absence of other right toe(s): Secondary | ICD-10-CM | POA: Insufficient documentation

## 2019-04-13 ENCOUNTER — Encounter (HOSPITAL_BASED_OUTPATIENT_CLINIC_OR_DEPARTMENT_OTHER): Payer: Medicare Other | Attending: Internal Medicine

## 2019-04-13 DIAGNOSIS — E1142 Type 2 diabetes mellitus with diabetic polyneuropathy: Secondary | ICD-10-CM | POA: Diagnosis not present

## 2019-04-13 DIAGNOSIS — L97512 Non-pressure chronic ulcer of other part of right foot with fat layer exposed: Secondary | ICD-10-CM | POA: Insufficient documentation

## 2019-04-13 DIAGNOSIS — I10 Essential (primary) hypertension: Secondary | ICD-10-CM | POA: Diagnosis not present

## 2019-04-13 DIAGNOSIS — E11621 Type 2 diabetes mellitus with foot ulcer: Secondary | ICD-10-CM | POA: Diagnosis present

## 2019-04-20 DIAGNOSIS — E11621 Type 2 diabetes mellitus with foot ulcer: Secondary | ICD-10-CM | POA: Diagnosis not present

## 2020-10-04 ENCOUNTER — Other Ambulatory Visit: Payer: Self-pay

## 2020-10-04 ENCOUNTER — Inpatient Hospital Stay (HOSPITAL_COMMUNITY)
Admission: EM | Admit: 2020-10-04 | Discharge: 2020-10-14 | DRG: 853 | Disposition: A | Discharge status: Home Health | Payer: Medicare Other | Attending: Family Medicine | Admitting: Family Medicine

## 2020-10-04 ENCOUNTER — Encounter (HOSPITAL_COMMUNITY): Payer: Self-pay

## 2020-10-04 ENCOUNTER — Emergency Department (HOSPITAL_COMMUNITY): Payer: Medicare Other

## 2020-10-04 DIAGNOSIS — Z810 Family history of intellectual disabilities: Secondary | ICD-10-CM

## 2020-10-04 DIAGNOSIS — Z452 Encounter for adjustment and management of vascular access device: Secondary | ICD-10-CM

## 2020-10-04 DIAGNOSIS — K219 Gastro-esophageal reflux disease without esophagitis: Secondary | ICD-10-CM | POA: Diagnosis present

## 2020-10-04 DIAGNOSIS — L97529 Non-pressure chronic ulcer of other part of left foot with unspecified severity: Secondary | ICD-10-CM | POA: Diagnosis present

## 2020-10-04 DIAGNOSIS — Z794 Long term (current) use of insulin: Secondary | ICD-10-CM

## 2020-10-04 DIAGNOSIS — E785 Hyperlipidemia, unspecified: Secondary | ICD-10-CM | POA: Diagnosis present

## 2020-10-04 DIAGNOSIS — Z20822 Contact with and (suspected) exposure to covid-19: Secondary | ICD-10-CM | POA: Diagnosis present

## 2020-10-04 DIAGNOSIS — R6520 Severe sepsis without septic shock: Secondary | ICD-10-CM | POA: Diagnosis not present

## 2020-10-04 DIAGNOSIS — E11622 Type 2 diabetes mellitus with other skin ulcer: Secondary | ICD-10-CM | POA: Diagnosis present

## 2020-10-04 DIAGNOSIS — Z7951 Long term (current) use of inhaled steroids: Secondary | ICD-10-CM

## 2020-10-04 DIAGNOSIS — Z6841 Body Mass Index (BMI) 40.0 and over, adult: Secondary | ICD-10-CM | POA: Diagnosis not present

## 2020-10-04 DIAGNOSIS — E1140 Type 2 diabetes mellitus with diabetic neuropathy, unspecified: Secondary | ICD-10-CM | POA: Diagnosis present

## 2020-10-04 DIAGNOSIS — I96 Gangrene, not elsewhere classified: Secondary | ICD-10-CM | POA: Diagnosis not present

## 2020-10-04 DIAGNOSIS — Z833 Family history of diabetes mellitus: Secondary | ICD-10-CM

## 2020-10-04 DIAGNOSIS — Z83438 Family history of other disorder of lipoprotein metabolism and other lipidemia: Secondary | ICD-10-CM

## 2020-10-04 DIAGNOSIS — A419 Sepsis, unspecified organism: Secondary | ICD-10-CM | POA: Diagnosis present

## 2020-10-04 DIAGNOSIS — K589 Irritable bowel syndrome without diarrhea: Secondary | ICD-10-CM | POA: Diagnosis present

## 2020-10-04 DIAGNOSIS — L0390 Cellulitis, unspecified: Secondary | ICD-10-CM | POA: Diagnosis not present

## 2020-10-04 DIAGNOSIS — A480 Gas gangrene: Secondary | ICD-10-CM | POA: Diagnosis present

## 2020-10-04 DIAGNOSIS — E11621 Type 2 diabetes mellitus with foot ulcer: Secondary | ICD-10-CM | POA: Diagnosis present

## 2020-10-04 DIAGNOSIS — Z79899 Other long term (current) drug therapy: Secondary | ICD-10-CM | POA: Diagnosis not present

## 2020-10-04 DIAGNOSIS — L89624 Pressure ulcer of left heel, stage 4: Secondary | ICD-10-CM | POA: Diagnosis not present

## 2020-10-04 DIAGNOSIS — E559 Vitamin D deficiency, unspecified: Secondary | ICD-10-CM | POA: Diagnosis present

## 2020-10-04 DIAGNOSIS — L02612 Cutaneous abscess of left foot: Secondary | ICD-10-CM | POA: Diagnosis present

## 2020-10-04 DIAGNOSIS — L97429 Non-pressure chronic ulcer of left heel and midfoot with unspecified severity: Secondary | ICD-10-CM | POA: Diagnosis present

## 2020-10-04 DIAGNOSIS — B955 Unspecified streptococcus as the cause of diseases classified elsewhere: Secondary | ICD-10-CM | POA: Diagnosis not present

## 2020-10-04 DIAGNOSIS — M797 Fibromyalgia: Secondary | ICD-10-CM | POA: Diagnosis present

## 2020-10-04 DIAGNOSIS — E11628 Type 2 diabetes mellitus with other skin complications: Secondary | ICD-10-CM | POA: Diagnosis present

## 2020-10-04 DIAGNOSIS — Z806 Family history of leukemia: Secondary | ICD-10-CM

## 2020-10-04 DIAGNOSIS — E1152 Type 2 diabetes mellitus with diabetic peripheral angiopathy with gangrene: Secondary | ICD-10-CM | POA: Diagnosis present

## 2020-10-04 DIAGNOSIS — I10 Essential (primary) hypertension: Secondary | ICD-10-CM

## 2020-10-04 DIAGNOSIS — E1165 Type 2 diabetes mellitus with hyperglycemia: Secondary | ICD-10-CM

## 2020-10-04 DIAGNOSIS — J45909 Unspecified asthma, uncomplicated: Secondary | ICD-10-CM | POA: Diagnosis present

## 2020-10-04 DIAGNOSIS — B9689 Other specified bacterial agents as the cause of diseases classified elsewhere: Secondary | ICD-10-CM | POA: Diagnosis present

## 2020-10-04 DIAGNOSIS — B964 Proteus (mirabilis) (morganii) as the cause of diseases classified elsewhere: Secondary | ICD-10-CM | POA: Diagnosis not present

## 2020-10-04 DIAGNOSIS — D649 Anemia, unspecified: Secondary | ICD-10-CM | POA: Diagnosis present

## 2020-10-04 DIAGNOSIS — L03116 Cellulitis of left lower limb: Secondary | ICD-10-CM | POA: Diagnosis present

## 2020-10-04 DIAGNOSIS — D638 Anemia in other chronic diseases classified elsewhere: Secondary | ICD-10-CM | POA: Diagnosis present

## 2020-10-04 DIAGNOSIS — L8990 Pressure ulcer of unspecified site, unspecified stage: Secondary | ICD-10-CM | POA: Diagnosis present

## 2020-10-04 DIAGNOSIS — E119 Type 2 diabetes mellitus without complications: Secondary | ICD-10-CM | POA: Diagnosis not present

## 2020-10-04 DIAGNOSIS — Z8049 Family history of malignant neoplasm of other genital organs: Secondary | ICD-10-CM

## 2020-10-04 DIAGNOSIS — Z888 Allergy status to other drugs, medicaments and biological substances status: Secondary | ICD-10-CM | POA: Diagnosis not present

## 2020-10-04 DIAGNOSIS — I4891 Unspecified atrial fibrillation: Secondary | ICD-10-CM | POA: Diagnosis present

## 2020-10-04 DIAGNOSIS — I361 Nonrheumatic tricuspid (valve) insufficiency: Secondary | ICD-10-CM | POA: Diagnosis not present

## 2020-10-04 DIAGNOSIS — Z91041 Radiographic dye allergy status: Secondary | ICD-10-CM

## 2020-10-04 DIAGNOSIS — L03119 Cellulitis of unspecified part of limb: Secondary | ICD-10-CM | POA: Diagnosis not present

## 2020-10-04 DIAGNOSIS — R7881 Bacteremia: Secondary | ICD-10-CM | POA: Diagnosis not present

## 2020-10-04 DIAGNOSIS — R6521 Severe sepsis with septic shock: Secondary | ICD-10-CM

## 2020-10-04 DIAGNOSIS — Z8249 Family history of ischemic heart disease and other diseases of the circulatory system: Secondary | ICD-10-CM

## 2020-10-04 DIAGNOSIS — L89150 Pressure ulcer of sacral region, unstageable: Secondary | ICD-10-CM | POA: Diagnosis present

## 2020-10-04 DIAGNOSIS — Z79891 Long term (current) use of opiate analgesic: Secondary | ICD-10-CM

## 2020-10-04 LAB — COMPREHENSIVE METABOLIC PANEL
ALT: 35 U/L (ref 0–44)
AST: 65 U/L — ABNORMAL HIGH (ref 15–41)
Albumin: 2 g/dL — ABNORMAL LOW (ref 3.5–5.0)
Alkaline Phosphatase: 164 U/L — ABNORMAL HIGH (ref 38–126)
Anion gap: 11 (ref 5–15)
BUN: 28 mg/dL — ABNORMAL HIGH (ref 8–23)
CO2: 22 mmol/L (ref 22–32)
Calcium: 8.2 mg/dL — ABNORMAL LOW (ref 8.9–10.3)
Chloride: 101 mmol/L (ref 98–111)
Creatinine, Ser: 1.09 mg/dL — ABNORMAL HIGH (ref 0.44–1.00)
GFR, Estimated: 55 mL/min — ABNORMAL LOW (ref >60)
Glucose, Bld: 103 mg/dL — ABNORMAL HIGH (ref 70–99)
Potassium: 3.9 mmol/L (ref 3.5–5.1)
Sodium: 134 mmol/L — ABNORMAL LOW (ref 135–145)
Total Bilirubin: 1 mg/dL (ref 0.3–1.2)
Total Protein: 7.4 g/dL (ref 6.5–8.1)

## 2020-10-04 LAB — CBC WITH DIFFERENTIAL/PLATELET
Abs Immature Granulocytes: 1.21 10*3/uL — ABNORMAL HIGH (ref 0.00–0.07)
Basophils Absolute: 0 10*3/uL (ref 0.0–0.1)
Basophils Relative: 1 %
Eosinophils Absolute: 0.3 10*3/uL (ref 0.0–0.5)
Eosinophils Relative: 1 %
HCT: 34.1 % — ABNORMAL LOW (ref 36.0–46.0)
Hemoglobin: 10.3 g/dL — ABNORMAL LOW (ref 12.0–15.0)
Immature Granulocytes: 4 %
Lymphocytes Relative: 6 %
Lymphs Abs: 1.7 10*3/uL (ref 0.7–4.0)
MCH: 27.6 pg (ref 26.0–34.0)
MCHC: 30.2 g/dL (ref 30.0–36.0)
MCV: 91.4 fL (ref 80.0–100.0)
Monocytes Absolute: 1.2 10*3/uL — ABNORMAL HIGH (ref 0.1–1.0)
Monocytes Relative: 4 %
Neutro Abs: 25 10*3/uL — ABNORMAL HIGH (ref 1.7–7.7)
Neutrophils Relative %: 84 %
Platelets: 344 10*3/uL (ref 150–400)
RBC: 3.73 MIL/uL — ABNORMAL LOW (ref 3.87–5.11)
RDW: 15.4 % (ref 11.5–15.5)
WBC: 29.4 10*3/uL — ABNORMAL HIGH (ref 4.0–10.5)
nRBC: 0 % (ref 0.0–0.2)

## 2020-10-04 LAB — URINALYSIS, ROUTINE W REFLEX MICROSCOPIC
Bilirubin Urine: NEGATIVE
Glucose, UA: 50 mg/dL — AB
Ketones, ur: NEGATIVE mg/dL
Nitrite: NEGATIVE
Protein, ur: 30 mg/dL — AB
Specific Gravity, Urine: 1.015 (ref 1.005–1.030)
pH: 5 (ref 5.0–8.0)

## 2020-10-04 LAB — APTT: aPTT: 22 seconds — ABNORMAL LOW (ref 24–36)

## 2020-10-04 LAB — PROTIME-INR
INR: 1.3 — ABNORMAL HIGH (ref 0.8–1.2)
Prothrombin Time: 15.3 seconds — ABNORMAL HIGH (ref 11.4–15.2)

## 2020-10-04 LAB — LACTIC ACID, PLASMA: Lactic Acid, Venous: 2.6 mmol/L (ref 0.5–1.9)

## 2020-10-04 MED ORDER — CLINDAMYCIN PHOSPHATE 600 MG/50ML IV SOLN
600.0000 mg | Freq: Once | INTRAVENOUS | Status: AC
  Administered 2020-10-04: 600 mg via INTRAVENOUS
  Filled 2020-10-04: qty 50

## 2020-10-04 MED ORDER — PIPERACILLIN-TAZOBACTAM 3.375 G IVPB 30 MIN: 3.3750 g | Freq: Three times a day (TID) | INTRAVENOUS | Status: DC

## 2020-10-04 MED ORDER — SODIUM CHLORIDE 0.9 % IV BOLUS
1000.0000 mL | Freq: Once | INTRAVENOUS | Status: AC
  Administered 2020-10-04: 1000 mL via INTRAVENOUS

## 2020-10-04 MED ORDER — CLINDAMYCIN PHOSPHATE 600 MG/50ML IV SOLN
600.0000 mg | Freq: Three times a day (TID) | INTRAVENOUS | Status: DC
  Administered 2020-10-05 – 2020-10-07 (×6): 600 mg via INTRAVENOUS
  Filled 2020-10-04 (×8): qty 50

## 2020-10-04 MED ORDER — ACETAMINOPHEN 650 MG RE SUPP
650.0000 mg | Freq: Once | RECTAL | Status: AC
  Administered 2020-10-04: 650 mg via RECTAL
  Filled 2020-10-04: qty 1

## 2020-10-04 MED ORDER — LACTATED RINGERS IV SOLN: INTRAVENOUS | Status: AC

## 2020-10-04 MED ORDER — PIPERACILLIN-TAZOBACTAM 3.375 G IVPB 30 MIN
3.3750 g | INTRAVENOUS | Status: AC
  Administered 2020-10-04: 3.375 g via INTRAVENOUS
  Filled 2020-10-04: qty 50

## 2020-10-04 MED ORDER — VANCOMYCIN HCL 2000 MG/400ML IV SOLN
2000.0000 mg | INTRAVENOUS | Status: AC
  Administered 2020-10-05: 2000 mg via INTRAVENOUS
  Filled 2020-10-04: qty 400

## 2020-10-04 MED ORDER — LACTATED RINGERS IV BOLUS
1000.0000 mL | Freq: Once | INTRAVENOUS | Status: AC
  Administered 2020-10-05: 1000 mL via INTRAVENOUS

## 2020-10-04 NOTE — ED Notes (Signed)
Date and time results received: 10/04/20 11:17 PM  (use smartphrase ".now" to insert current time)  Test: Lactic Acid Critical Value: 2.6  Name of Provider Notified: Fredrich Romans. PA  Orders Received? Or Actions Taken?:

## 2020-10-04 NOTE — ED Triage Notes (Signed)
Pt arrives EMS from home with c/o diabetic ulcer issue on heel area of left foot. Redness radiating up left leg. Pt also states generalized weakness and difficulty getting around. Uses walker to ambulate at home.

## 2020-10-04 NOTE — Progress Notes (Signed)
A consult was received from an ED physician for Vancomycin and Zosyn per pharmacy dosing.  The patient's profile has been reviewed for ht/wt/allergies/indication/available labs.    A one time order has been placed for Vancomycin 2gm and Zosyn 3.375gm IV.    Further antibiotics/pharmacy consults should be ordered by admitting physician if indicated.                       Thank you, Maryellen Pile, PharmD 10/04/2020  10:47 PM

## 2020-10-04 NOTE — Progress Notes (Signed)
EDP called for diabetic heel ulcer with gas gangrene, Tmax 100.8 with VSS. IV abx started. EDP plans for [facility] admit at [facility]. Will contact Dr. Lajoyce Corners for definitive plan.

## 2020-10-04 NOTE — Progress Notes (Signed)
Elink following for sepsis protocol. 

## 2020-10-04 NOTE — ED Provider Notes (Addendum)
[facility]-EMERGENCY DEPT Provider Note   CSN: [id number] Arrival date & time: 10/04/20  2126     History Chief Complaint  Patient presents with  . Skin Ulcer    Yesenia Payne is a 69 y.o. female.   69 y/o female presenting from home for evaluation of L foot ulcer. Hx of fibromyalgia, HTN, dyslipidemia, DM, morbid obesity. States that she just noticed the area was draining a day or two ago. Reports calling her PCP and requesting abx over the phone, but states her PCP declined to prescribe her any antibiotics. Per Care Everywhere, does seem that this ulcer has been present for at least 2 months and she was on abx for this in September. Has not formally seen a wound care specialist in at least a year. Denies any known associated fevers. Has felt generally weak lately with difficulty getting around. Uses a walker at baseline when ambulating.  The history is provided by the patient. No language interpreter was used.       Past Medical History:  Diagnosis Date  . Acid reflux   . Asthma   . Chronic headache   . Diabetes mellitus without complication (HCC)   . Diverticular disease   . Diverticulitis   . Dyslipidemia   . Fibromyalgia   . Hyperlipidemia   . Hypertension   . IBS (irritable bowel syndrome)   . Insomnia   . Irregular heartbeat   . Low back pain   . Morbid obesity (HCC)   . Neuropathy    distal peripheral  . Osteoarthritis   . PAC (premature atrial contraction) 01/18/2017  . Urinary incontinence   . Vitamin D deficiency     Patient Active Problem List   Diagnosis Date Noted  . DOE (dyspnea on exertion) 01/18/2017  . Benign essential HTN 01/18/2017  . PAC (premature atrial contraction) 01/18/2017    Past Surgical History:  Procedure Laterality Date  . arthoscopics    . BACK SURGERY     L4 & L5  . CESAREAN SECTION    . CESAREAN SECTION    . colonscopy  2009  . Left Knee Surgery  1993  . Right knee surgery  1990  . TOTAL KNEE  ARTHROPLASTY       OB History   No obstetric history on file.     Family History  Problem Relation Age of Onset  . Hypertension Other   . Hypertension Mother   . Hyperlipidemia Mother   . Heart attack Father   . Diabetes Father   . CAD Father   . Neuropathy Father   . Diabetes Paternal Grandfather   . Cancer - Other Sister        uterine cancer  . Mental retardation Brother   . Leukemia Sister   . Heart attack Sister   . Other Daughter        ruptured colon x2    Social History   Tobacco Use  . Smoking status: Never Smoker  . Smokeless tobacco: Never Used  Substance Use Topics  . Alcohol use: No    Alcohol/week: 0.0 standard drinks  . Drug use: No    Home Medications Prior to Admission medications   Medication Sig Start Date End Date Taking? Authorizing Provider  albuterol (PROVENTIL HFA;VENTOLIN HFA) 108 (90 BASE) MCG/ACT inhaler Inhale 1 puff into the lungs 2 (two) times daily.     Yes [provider]  albuterol (PROVENTIL) (2.5 MG/3ML) 0.083% nebulizer solution Take 2.5 mg  by nebulization 3 (three) times daily as needed. For shortness of breath.    Yes [provider]  amLODipine (NORVASC) 10 MG tablet Take 10 mg by mouth daily.   Yes [provider]  desloratadine (CLARINEX) 5 MG tablet Take 5 mg by mouth daily as needed.    Yes [provider]  Fluticasone-Salmeterol (ADVAIR) 250-50 MCG/DOSE AEPB Inhale 1 puff into the lungs daily.     Yes [provider]  furosemide (LASIX) 20 MG tablet Take 40 mg by mouth 2 (two) times daily.   Yes [provider]  gabapentin (NEURONTIN) 800 MG tablet Take 800 mg by mouth 4 (four) times daily.  09/12/16  Yes [provider]  insulin glargine (LANTUS) 100 UNIT/ML injection Inject 90 Units into the skin daily.    Yes [provider]  insulin lispro (HUMALOG) 100 UNIT/ML injection Inject 47 Units into the skin 3 (three) times daily with meals.    Yes [provider]  labetalol (NORMODYNE) 100 MG tablet Take by mouth.   Yes [provider]  lisinopril (PRINIVIL,ZESTRIL) 20 MG tablet Take 20 mg by mouth daily.   Yes [provider]  potassium chloride (KLOR-CON) 10 MEQ tablet Take 10 mEq by mouth 2 (two) times daily. 06/03/20  Yes [provider]  DULoxetine (CYMBALTA) 60 MG capsule Take 60 mg by mouth daily.   Patient not taking: Reported on 10/04/2020    [provider]  fenofibrate (TRICOR) 145 MG tablet Take 145 mg by mouth 2 (two) times daily.   Patient not taking: Reported on 10/04/2020    [provider]  metoprolol succinate (TOPROL-XL) 50 MG 24 hr tablet Take 1 tablet (50 mg total) by mouth daily. Take with or immediately following a meal. Patient not taking: Reported on 10/04/2020 01/18/17 01/13/18  Quintella Reichert, MD  oxyCODONE-acetaminophen (PERCOCET) 10-325 MG tablet Take 1 tablet by mouth 4 (four) times daily. 04/12/16   [provider]  POTASSIUM PO Take 1 tablet by mouth in the morning and at bedtime.     [provider]  rosuvastatin (CRESTOR) 20 MG tablet 1 tablet 2 times daily.    [provider]    Allergies    Iodinated diagnostic agents and Metrizamide  Review of Systems   Review of Systems  Ten systems reviewed and are negative for acute change, except as noted in the HPI.    Physical Exam Updated Vital Signs BP 102/66 (BP Location: Left Arm)   Pulse (!) 122   Temp (S) (!) 100.8 F (38.2 C) (Rectal)   Resp (!) 22   Ht 5' 3.25" (1.607 m)   Wt 124.7 kg   SpO2 98%   BMI 48.33 kg/m   Physical Exam Vitals and nursing note reviewed.  Constitutional:      General: She is not in acute distress.    Appearance: She is well-developed. She is not diaphoretic.     Comments: Morbidly obese. Mildly anxious.  HENT:     Head: Normocephalic and atraumatic.  Eyes:     General: No scleral icterus.    Conjunctiva/sclera: Conjunctivae normal.    Cardiovascular:     Rate and Rhythm: Tachycardia present. Rhythm irregular.     Pulses: Normal pulses.     Comments: DP pulse 2+ in the LLE.  Pulmonary:     Effort: Tachypnea present. No accessory muscle usage or respiratory distress.     Breath sounds: No stridor.     Comments:  Respirations even and unlabored Musculoskeletal:     Cervical back: Normal range of motion.     Comments: Large necrotic area to left heel with surrounding erythema, bloody purulent drainage. Erythema extends proximally to the L calf.  Skin:    General: Skin is warm.  Neurological:     Mental Status: She is alert and oriented to person, place, and time.     Coordination: Coordination normal.     Comments: Sensation to light touch intact in BLE and digits.  Psychiatric:        Behavior: Behavior normal.         ED Results / Procedures / Treatments   Labs (all labs ordered are listed, but only abnormal results are displayed) Labs Reviewed  LACTIC ACID, PLASMA - Abnormal; Notable for the following components:      Result Value   Lactic Acid, Venous 2.6 (*)    All other components within normal limits  COMPREHENSIVE METABOLIC PANEL - Abnormal; Notable for the following components:   Sodium 134 (*)    Glucose, Bld 103 (*)    BUN 28 (*)    Creatinine, Ser 1.09 (*)    Calcium 8.2 (*)    Albumin 2.0 (*)    AST 65 (*)    Alkaline Phosphatase 164 (*)    GFR, Estimated 55 (*)    All other components within normal limits  CBC WITH DIFFERENTIAL/PLATELET - Abnormal; Notable for the following components:   WBC 29.4 (*)    RBC 3.73 (*)    Hemoglobin 10.3 (*)    HCT 34.1 (*)    Neutro Abs 25.0 (*)    Monocytes Absolute 1.2 (*)    Abs Immature Granulocytes 1.21 (*)    All other components within normal limits  PROTIME-INR - Abnormal; Notable for the following components:   Prothrombin Time 15.3 (*)    INR 1.3 (*)    All other components within normal limits  APTT - Abnormal; Notable for the following  components:   aPTT 22 (*)    All other components within normal limits  URINALYSIS, ROUTINE W REFLEX MICROSCOPIC - Abnormal; Notable for the following components:   Color, Urine AMBER (*)    Glucose, UA 50 (*)    Hgb urine dipstick SMALL (*)    Protein, ur 30 (*)    Leukocytes,Ua TRACE (*)    Bacteria, UA RARE (*)    All other components within normal limits  URINE CULTURE  CULTURE, BLOOD (ROUTINE X 2)  CULTURE, BLOOD (ROUTINE X 2)  AEROBIC CULTURE (SUPERFICIAL SPECIMEN)  RESP PANEL BY RT-PCR (FLU A&B, COVID) [id number]  LACTIC ACID, PLASMA  C-REACTIVE PROTEIN  SEDIMENTATION RATE    EKG EKG Interpretation  Date/Time:  Saturday October 04 2020 21:51:18 EST Ventricular Rate:  111 PR Interval:    QRS Duration: 95 QT Interval:  316 QTC Calculation: 430 R Axis:   62 Text Interpretation: Atrial fibrillation Confirmed by Rochele RaringWard, Kristen [id number]) on 10/04/2020 11:39:43 PM   Radiology DG Chest Port 1 View  Result Date: 10/04/2020 CLINICAL DATA:  Diabetic ulcer to the left foot with redness radiating up the left leg. Generalized weakness. Concern for sepsis. EXAM: PORTABLE CHEST 1 VIEW COMPARISON:  12/25/2015 FINDINGS: Cardiac enlargement. Normal pulmonary vascularity. Lungs are clear. No pleural effusions. No pneumothorax. Mediastinal contours appear intact. IMPRESSION: Cardiac enlargement.  No active disease. Electronically Signed   By: Burman NievesWilliam  Stevens M.D.   On: 10/04/2020 22:35   DG Foot Complete Left  Result Date: 10/04/2020 CLINICAL DATA:  Diabetic ulcer over the left heel with redness radiating up the left leg. EXAM: LEFT FOOT - COMPLETE 3+ VIEW COMPARISON:  02/17/2011 FINDINGS: Previous amputation of the left third toe at the metatarsal phalangeal level. Degenerative changes in the interphalangeal, metatarsal-phalangeal, and intertarsal joints. No acute fracture or dislocation. Superficial ulceration is suggested in the plantar soft tissues over the calcaneus consistent with  history of ulceration. No bone changes to suggest osteomyelitis. Prominent diffuse soft tissue swelling over the left foot with soft tissue gas over the plantar and dorsal aspects. Changes are consistent with cellulitis due to gas-forming organism. IMPRESSION: Superficial ulceration in the plantar soft tissues over the calcaneus. Soft tissue gas over the plantar and dorsal aspects of the foot consistent with cellulitis due to gas-forming organism. No evidence of osteomyelitis. Electronically Signed   By: Burman Nieves M.D.   On: 10/04/2020 22:37    Procedures .Critical Care Performed by: Antony Madura, PA-C Authorized by: Antony Madura, PA-C   Critical care provider statement:    Critical care time (minutes):  60   Critical care was necessary to treat or prevent imminent or life-threatening deterioration of the following conditions:  Sepsis   Critical care was time spent personally by me on the following activities:  Discussions with consultants, evaluation of patient's response to treatment, examination of patient, ordering and performing treatments and interventions, ordering and review of laboratory studies, ordering and review of radiographic studies, pulse oximetry, re-evaluation of patient's condition, obtaining history from patient or surrogate and review of old charts   (including critical care time)  Medications Ordered in ED Medications  lactated ringers infusion (has no administration in time range)  vancomycin (VANCOREADY) IVPB 2000 mg/400 mL (has no administration in time range)  clindamycin (CLEOCIN) IVPB 600 mg (600 mg Intravenous New Bag/Given 10/04/20 2335)  lactated ringers bolus 1,000 mL (has no administration in time range)  sodium chloride 0.9 % bolus 1,000 mL (1,000 mLs Intravenous New Bag/Given 10/04/20 2234)  piperacillin-tazobactam (ZOSYN) IVPB 3.375 g (0 g Intravenous Stopped 10/04/20 2327)  acetaminophen (TYLENOL) suppository 650 mg (650 mg Rectal Given 10/04/20  2313)    ED Course  I have reviewed the triage vital signs and the nursing notes.  Pertinent labs & imaging results that were available during my care of the patient were reviewed by me and considered in my medical decision making (see chart for details).  Clinical Course as of Oct 06 651  Sat Oct 04, 2020  2312 Spoke with Dr. Linna Caprice of [facility]. Dr. Linna Caprice would advise input from Dr. Lajoyce Corners at some point during inpatient course. Patient would be best served at [facility] for admission should she necessitate operative debridement or amputation.    [KH]  2340 Spoke with Dr. Lajoyce Corners who will see patient in the AM at [facility]. Does not recommend additional imaging at this time. Requests patient remain NPO.   [KH]  2347 Dr. Sedalia Muta of [facility] to assess the patient in the ED for admission to [facility].   [KH]  Sun Oct 05, 2020  2706 Patient with 2 IVs established; one 18 gauge and one 22 gauge. Additional 2L LR infusing. BP downtrending, but has only received 1L. Most recent BP 94/53.   [KH]  0205 Vital signs improving with IVF boluses. BP stable with good MAP.   [KH]    Clinical Course User Index [KH] Antony Madura, PA-C   MDM Rules/Calculators/A&P  69 year old female to be admitted to [facility] for management of sepsis secondary to necrotizing cellulitis of the left lower extremity.  Started on vancomycin, Zosyn, clindamycin.  Will keep n.p.o.  Also being resuscitated with IV fluids.  Noted to be in atrial fibrillation with RVR.  Patient with no known history of Afib.  Have withheld rate control medications given septic presentation.  Dr. Sedalia Muta of [facility] to admit.  Dr. Lajoyce Corners aware of patient with plans to consult in AM.   Final Clinical Impression(s) / ED Diagnoses Final diagnoses:  Sepsis, due to unspecified organism, unspecified whether acute organ dysfunction present [facility])  Necrotizing cellulitis  Atrial fibrillation with RVR [facility])    Rx / DC Orders ED  Discharge Orders    None       Antony Madura, PA-C 10/04/20 2353    Antony Madura, PA-C 10/05/20 0652    Ward, Layla Maw, DO 10/05/20 [phone]

## 2020-10-04 NOTE — H&P (Signed)
History and Physical   Yesenia Payne ZDG:[id number] DOB: [birth year] DOA: 10/04/2020  PCP: Merri Brunette, MD  Outpatient Specialists: [facility] pain management Patient coming from: Home  I have personally briefly reviewed patient's old medical records in [facility] EMR.  Chief Concern: Left lower extremity wound  HPI: Yesenia Payne is a 69 y.o. female with medical history significant for hypertension, iddm, hyperlipidemia presents to the ED for chief concern of left lower leg wound that started about two days ago.   She reports she only noticed the wound 2 days ago.  And that she presented to the emergency department because the wound burst today.  She states the leg has been red and swollen for about 1-2 weeks. She denies presence of wounds to her knowledge prior to two days ago.  She denies trauma to the lower extremity.  ROS was negative for headache, vision changes, dysphagia, odynophagia, cough, chest pain, shortness of breath, abdominal pain, dysuria, hematuria, diarrhea, blood in her stool, constipation.   ED Course: Discussed with ED provider   Review of Systems: As per HPI otherwise 10 point review of systems negative.   Assessment/Plan  Active Problems:   Necrotizing cellulitis   Septic shock-responsive to fluids, source likely lower extremity leg wound -Organ dysfunction, cardiovascular -Continue IV Vanco, clindamycin, Zosyn -Blood cultures x2 -Status post 1 L normal saline bolus, 1 L LR bolus per ED provider -Ordered additional 1 L of normal saline bolus -Continue LR IVF at 150 cc/h -PCCM consulted  Diabetic heel ulcer gas gangrene-Concerning for necrotizing cellulitis -ED provider consulted orthopedic service -Orthopedic surgeon recommends n.p.o., transfer to [facility] and did not recommend further imaging per ED provider -Dr. Lajoyce Corners will be consulting surgeon  New A. fib with RVR-suspect secondary to  sepsis -Telemetry -Maintain goal heart rate of less than 120  History of hypertension-holding antihypertensives at this time due to sepsis with low blood pressure -Holding home amlodipine 10 mg daily, furosemide 40 mg daily, lisinopril 20 mg  Hyperlipidemia-rosuvastatin resumed  Insulin-dependent diabetes mellitus-patient takes glargine 100 units subcutaneous daily -As she is n.p.o., her home insulin glargine is reduced to 50 units starting tomorrow -Insulin SSI  Chart reviewed.   DVT prophylaxis: Holding due to likely patient going to surgery Code Status: Full code Diet: N.p.o. per orthopedic service Family Communication: Discussed with daughter at bedside Disposition Plan: Pending clinical course Consults called: Orthopedic and PCCM Admission status: Inpatient with cardiac telemetry  Past Medical History:  Diagnosis Date  . Acid reflux   . Asthma   . Chronic headache   . Diabetes mellitus without complication (HCC)   . Diverticular disease   . Diverticulitis   . Dyslipidemia   . Fibromyalgia   . Hyperlipidemia   . Hypertension   . IBS (irritable bowel syndrome)   . Insomnia   . Irregular heartbeat   . Low back pain   . Morbid obesity (HCC)   . Neuropathy    distal peripheral  . Osteoarthritis   . PAC (premature atrial contraction) 01/18/2017  . Urinary incontinence   . Vitamin D deficiency    Past Surgical History:  Procedure Laterality Date  . arthoscopics    . BACK SURGERY     L4 & L5  . CESAREAN SECTION    . CESAREAN SECTION    . colonscopy  2009  . Left Knee Surgery  1993  . Right knee surgery  1990  . TOTAL KNEE ARTHROPLASTY  Social History:  reports that she has never smoked. She has never used smokeless tobacco. She reports that she does not drink alcohol and does not use drugs.  Allergies  Allergen Reactions  . Iodinated Diagnostic Agents Hives    Other reaction(s): Hives   . Metrizamide Hives   Family History  Problem Relation Age of  Onset  . Hypertension Other   . Hypertension Mother   . Hyperlipidemia Mother   . Heart attack Father   . Diabetes Father   . CAD Father   . Neuropathy Father   . Diabetes Paternal Grandfather   . Cancer - Other Sister        uterine cancer  . Mental retardation Brother   . Leukemia Sister   . Heart attack Sister   . Other Daughter        ruptured colon x2   Family history: Family history reviewed and father had multiple diabetic leg wound ulcers  Prior to Admission medications   Medication Sig Start Date End Date Taking? Authorizing Provider  albuterol (PROVENTIL HFA;VENTOLIN HFA) 108 (90 BASE) MCG/ACT inhaler Inhale 1 puff into the lungs 2 (two) times daily.     Yes [provider]  albuterol (PROVENTIL) (2.5 MG/3ML) 0.083% nebulizer solution Take 2.5 mg by nebulization 3 (three) times daily as needed. For shortness of breath.    Yes [provider]  amLODipine (NORVASC) 10 MG tablet Take 10 mg by mouth daily.   Yes [provider]  desloratadine (CLARINEX) 5 MG tablet Take 5 mg by mouth daily as needed.    Yes [provider]  Fluticasone-Salmeterol (ADVAIR) 250-50 MCG/DOSE AEPB Inhale 1 puff into the lungs daily.     Yes [provider]  furosemide (LASIX) 20 MG tablet Take 40 mg by mouth 2 (two) times daily.   Yes [provider]  gabapentin (NEURONTIN) 800 MG tablet Take 800 mg by mouth 4 (four) times daily.  09/12/16  Yes [provider]  insulin glargine (LANTUS) 100 UNIT/ML injection Inject 90 Units into the skin daily.    Yes [provider]  insulin lispro (HUMALOG) 100 UNIT/ML injection Inject 47 Units into the skin 3 (three) times daily with meals.    Yes [provider]  labetalol (NORMODYNE) 100 MG tablet Take by mouth.   Yes [provider]  lisinopril (PRINIVIL,ZESTRIL) 20 MG tablet Take 20 mg by mouth daily.   Yes [provider]  potassium chloride (KLOR-CON) 10 MEQ  tablet Take 10 mEq by mouth 2 (two) times daily. 06/03/20  Yes [provider]  DULoxetine (CYMBALTA) 60 MG capsule Take 60 mg by mouth daily.   Patient not taking: Reported on 10/04/2020    [provider]  fenofibrate (TRICOR) 145 MG tablet Take 145 mg by mouth 2 (two) times daily.   Patient not taking: Reported on 10/04/2020    [provider]  metoprolol succinate (TOPROL-XL) 50 MG 24 hr tablet Take 1 tablet (50 mg total) by mouth daily. Take with or immediately following a meal. Patient not taking: Reported on 10/04/2020 01/18/17 01/13/18  Quintella Reichert, MD  oxyCODONE-acetaminophen (PERCOCET) 10-325 MG tablet Take 1 tablet by mouth 4 (four) times daily. 04/12/16   [provider]  POTASSIUM PO Take 1 tablet by mouth in the morning and at bedtime.     [provider]  rosuvastatin (CRESTOR) 20 MG tablet 1 tablet 2 times daily.    [provider]   Physical Exam:  Vitals:   10/04/20 2230 10/04/20 2300 10/04/20 2313 10/04/20 2333  BP: (!) 158/123 (!) 160/107  102/66  Pulse: (!) 132 (!) 157  (!) 122  Resp: (!) 32 (!) 31  (!) 22  Temp:   (S) (!) 100.8 F (38.2 C)   TempSrc:   (S) Rectal   SpO2: 100% 100%  98%  Weight:      Height:       Constitutional: appears age appropriate , NAD, calm, minimal discomfort Eyes: PERRL, lids and conjunctivae normal ENMT: Mucous membranes are moist. Posterior pharynx clear of any exudate or lesions. Age-appropriate dentition. Hearing appropriate Neck: normal, supple, no masses, no thyromegaly Respiratory: clear to auscultation bilaterally, no wheezing, no crackles. Normal respiratory effort. No accessory muscle use.  Cardiovascular: Regular rate and rhythm, no murmurs / rubs / gallops. No extremity edema. 2+ pedal pulses. No carotid bruits.  Abdomen: Obese abdomen, no tenderness, no masses palpated, no hepatosplenomegaly. Bowel sounds positive.  Musculoskeletal: no clubbing / cyanosis. No joint deformity  upper and lower extremities. Good ROM, no contractures, no atrophy. Normal muscle tone.  Skin: left lower extremity wound with subcutaneous gas and odor. Necrotizing wound at the heal of the left foot. Skin is feverish.  Left lower extremity positive for redness and crepitus Neurologic: Sensation intact. Strength 5/5 in all 4.  Psychiatric: Normal judgment and insight. Alert and oriented x 3. Normal mood.   EKG: Independently reviewed, showing afib with 111, qtc 430  Chest x-ray on Admission: Personally reviewed and I agree with radiologist reading as below.  DG Chest Port 1 View  Result Date: 10/04/2020 CLINICAL DATA:  Diabetic ulcer to the left foot with redness radiating up the left leg. Generalized weakness. Concern for sepsis. EXAM: PORTABLE CHEST 1 VIEW COMPARISON:  12/25/2015 FINDINGS: Cardiac enlargement. Normal pulmonary vascularity. Lungs are clear. No pleural effusions. No pneumothorax. Mediastinal contours appear intact. IMPRESSION: Cardiac enlargement.  No active disease. Electronically Signed   By: Burman Nieves M.D.   On: 10/04/2020 22:35   DG Foot Complete Left  Result Date: 10/04/2020 CLINICAL DATA:  Diabetic ulcer over the left heel with redness radiating up the left leg. EXAM: LEFT FOOT - COMPLETE 3+ VIEW COMPARISON:  02/17/2011 FINDINGS: Previous amputation of the left third toe at the metatarsal phalangeal level. Degenerative changes in the interphalangeal, metatarsal-phalangeal, and intertarsal joints. No acute fracture or dislocation. Superficial ulceration is suggested in the plantar soft tissues over the calcaneus consistent with history of ulceration. No bone changes to suggest osteomyelitis. Prominent diffuse soft tissue swelling over the left foot with soft tissue gas over the plantar and dorsal aspects. Changes are consistent with cellulitis due to gas-forming organism. IMPRESSION: Superficial ulceration in the plantar soft tissues over the calcaneus. Soft tissue gas  over the plantar and dorsal aspects of the foot consistent with cellulitis due to gas-forming organism. No evidence of osteomyelitis. Electronically Signed   By: Burman Nieves M.D.   On: 10/04/2020 22:37   Labs on Admission: I have personally reviewed following labs  CBC: Recent Labs  Lab 10/04/20 2220  WBC 29.4*  NEUTROABS 25.0*  HGB 10.3*  HCT 34.1*  MCV 91.4  PLT 344   Basic Metabolic Panel: Recent Labs  Lab 10/04/20 2220  NA 134*  K 3.9  CL 101  CO2 22  GLUCOSE 103*  BUN 28*  CREATININE 1.09*  CALCIUM 8.2*   GFR: Estimated Creatinine Clearance: 61.9 mL/min (A) (by C-G formula based on SCr of 1.09 mg/dL (H)).  Liver Function Tests: Recent Labs  Lab 10/04/20 2220  AST 65*  ALT 35  ALKPHOS 164*  BILITOT 1.0  PROT 7.4  ALBUMIN 2.0*   Coagulation Profile: Recent Labs  Lab 10/04/20 2220  INR 1.3*   Urine analysis:    Component Value Date/Time   COLORURINE AMBER (A) 10/04/2020 2220   APPEARANCEUR CLEAR 10/04/2020 2220   LABSPEC 1.015 10/04/2020 2220   PHURINE 5.0 10/04/2020 2220   GLUCOSEU 50 (A) 10/04/2020 2220   HGBUR SMALL (A) 10/04/2020 2220   BILIRUBINUR NEGATIVE 10/04/2020 2220   KETONESUR NEGATIVE 10/04/2020 2220   PROTEINUR 30 (A) 10/04/2020 2220   UROBILINOGEN 0.2 11/11/2011 1443   NITRITE NEGATIVE 10/04/2020 2220   LEUKOCYTESUR TRACE (A) 10/04/2020 2220   Critical care   Performed by: Londell MohAmy Maccoy Haubner, DO Advised by: Londell MohAmy Alfhild Partch, DO   Critical care provider statement Critical care time: 40 minutes   Critical care was necessary to treat or prevent imminent or life-threatening deterioration of the following conditions: septic shock with cardiovascular involvement   Critical care time was spent personally by me on the following activities: Discussion with consultants, evaluation of patient's response to treatment, re-evaluation of patient's response to treatment, examination of patient, ordering and performing treatments and interventions, ordering  and review of laboratory studies, ordering and reviewing of radiographic studies, pulse oximetry, reevaluation of patient's condition, obtaining history from patient and review of old charts.  Including review of EKG in the emergency department and previous EKG.  Parvin Stetzer N Jinnifer Montejano D.O. [facility]  If 2AM-7AM, please contact overnight-coverage provider If 7AM-7PM, please contact day coverage provider [email or web address]  10/04/2020, 11:59 PM

## 2020-10-05 DIAGNOSIS — L02612 Cutaneous abscess of left foot: Secondary | ICD-10-CM | POA: Diagnosis not present

## 2020-10-05 DIAGNOSIS — R6520 Severe sepsis without septic shock: Secondary | ICD-10-CM

## 2020-10-05 DIAGNOSIS — L0390 Cellulitis, unspecified: Secondary | ICD-10-CM | POA: Diagnosis not present

## 2020-10-05 DIAGNOSIS — D649 Anemia, unspecified: Secondary | ICD-10-CM

## 2020-10-05 DIAGNOSIS — A419 Sepsis, unspecified organism: Principal | ICD-10-CM

## 2020-10-05 DIAGNOSIS — L8990 Pressure ulcer of unspecified site, unspecified stage: Secondary | ICD-10-CM | POA: Diagnosis present

## 2020-10-05 LAB — CBC
HCT: 25.5 % — ABNORMAL LOW (ref 36.0–46.0)
Hemoglobin: 8 g/dL — ABNORMAL LOW (ref 12.0–15.0)
MCH: 28.1 pg (ref 26.0–34.0)
MCHC: 31.4 g/dL (ref 30.0–36.0)
MCV: 89.5 fL (ref 80.0–100.0)
Platelets: 285 10*3/uL (ref 150–400)
RBC: 2.85 MIL/uL — ABNORMAL LOW (ref 3.87–5.11)
RDW: 15.3 % (ref 11.5–15.5)
WBC: 23.7 10*3/uL — ABNORMAL HIGH (ref 4.0–10.5)
nRBC: 0 % (ref 0.0–0.2)

## 2020-10-05 LAB — BASIC METABOLIC PANEL
Anion gap: 10 (ref 5–15)
BUN: 25 mg/dL — ABNORMAL HIGH (ref 8–23)
CO2: 20 mmol/L — ABNORMAL LOW (ref 22–32)
Calcium: 7.4 mg/dL — ABNORMAL LOW (ref 8.9–10.3)
Chloride: 106 mmol/L (ref 98–111)
Creatinine, Ser: 0.99 mg/dL (ref 0.44–1.00)
GFR, Estimated: >60 mL/min (ref >60)
Glucose, Bld: 93 mg/dL (ref 70–99)
Potassium: 3.9 mmol/L (ref 3.5–5.1)
Sodium: 136 mmol/L (ref 135–145)

## 2020-10-05 LAB — BLOOD CULTURE ID PANEL (REFLEXED) - BCID2
A.calcoaceticus-baumannii: NOT DETECTED
Bacteroides fragilis: DETECTED — AB
CTX-M ESBL: NOT DETECTED
Candida albicans: NOT DETECTED
Candida auris: NOT DETECTED
Candida glabrata: NOT DETECTED
Candida krusei: NOT DETECTED
Candida parapsilosis: NOT DETECTED
Candida tropicalis: NOT DETECTED
Carbapenem resist OXA 48 LIKE: NOT DETECTED
Carbapenem resistance IMP: NOT DETECTED
Carbapenem resistance KPC: NOT DETECTED
Carbapenem resistance NDM: NOT DETECTED
Carbapenem resistance VIM: NOT DETECTED
Cryptococcus neoformans/gattii: NOT DETECTED
Enterobacter cloacae complex: NOT DETECTED
Enterobacterales: DETECTED — AB
Enterococcus Faecium: NOT DETECTED
Enterococcus faecalis: NOT DETECTED
Escherichia coli: NOT DETECTED
Haemophilus influenzae: NOT DETECTED
Klebsiella aerogenes: NOT DETECTED
Klebsiella oxytoca: NOT DETECTED
Klebsiella pneumoniae: NOT DETECTED
Listeria monocytogenes: NOT DETECTED
Neisseria meningitidis: NOT DETECTED
Proteus species: DETECTED — AB
Pseudomonas aeruginosa: NOT DETECTED
Salmonella species: NOT DETECTED
Serratia marcescens: NOT DETECTED
Staphylococcus aureus (BCID): NOT DETECTED
Staphylococcus epidermidis: NOT DETECTED
Staphylococcus lugdunensis: NOT DETECTED
Staphylococcus species: NOT DETECTED
Stenotrophomonas maltophilia: NOT DETECTED
Streptococcus agalactiae: NOT DETECTED
Streptococcus pneumoniae: NOT DETECTED
Streptococcus pyogenes: NOT DETECTED
Streptococcus species: DETECTED — AB

## 2020-10-05 LAB — HEMOGLOBIN A1C
Hgb A1c MFr Bld: 7.2 % — ABNORMAL HIGH (ref 4.8–5.6)
Mean Plasma Glucose: 159.94 mg/dL

## 2020-10-05 LAB — CBG MONITORING, ED
Glucose-Capillary: 60 mg/dL — ABNORMAL LOW (ref 70–99)
Glucose-Capillary: 66 mg/dL — ABNORMAL LOW (ref 70–99)
Glucose-Capillary: 75 mg/dL (ref 70–99)
Glucose-Capillary: 85 mg/dL (ref 70–99)

## 2020-10-05 LAB — GLUCOSE, CAPILLARY
Glucose-Capillary: 111 mg/dL — ABNORMAL HIGH (ref 70–99)
Glucose-Capillary: 136 mg/dL — ABNORMAL HIGH (ref 70–99)
Glucose-Capillary: 231 mg/dL — ABNORMAL HIGH (ref 70–99)
Glucose-Capillary: 247 mg/dL — ABNORMAL HIGH (ref 70–99)

## 2020-10-05 LAB — C-REACTIVE PROTEIN: CRP: 26.7 mg/dL — ABNORMAL HIGH (ref <1.0)

## 2020-10-05 LAB — LACTIC ACID, PLASMA: Lactic Acid, Venous: 2.2 mmol/L (ref 0.5–1.9)

## 2020-10-05 LAB — PREPARE RBC (CROSSMATCH)

## 2020-10-05 LAB — RESP PANEL BY RT-PCR (FLU A&B, COVID) ARPGX2
Influenza A by PCR: NEGATIVE
Influenza B by PCR: NEGATIVE
SARS Coronavirus 2 by RT PCR: NEGATIVE

## 2020-10-05 LAB — SURGICAL PCR SCREEN
MRSA, PCR: NEGATIVE
Staphylococcus aureus: NEGATIVE

## 2020-10-05 LAB — TSH: TSH: 1.327 u[IU]/mL (ref 0.350–4.500)

## 2020-10-05 LAB — HIV ANTIBODY (ROUTINE TESTING W REFLEX): HIV Screen 4th Generation wRfx: NONREACTIVE

## 2020-10-05 LAB — ABO/RH: ABO/RH(D): O POS

## 2020-10-05 LAB — SEDIMENTATION RATE: Sed Rate: 105 mm/hr — ABNORMAL HIGH (ref 0–22)

## 2020-10-05 MED ORDER — POVIDONE-IODINE 10 % EX SWAB: 2.0000 "application " | Freq: Once | CUTANEOUS | Status: DC

## 2020-10-05 MED ORDER — SODIUM CHLORIDE 0.9 % IV BOLUS
1000.0000 mL | Freq: Once | INTRAVENOUS | Status: AC
  Administered 2020-10-05: 1000 mL via INTRAVENOUS

## 2020-10-05 MED ORDER — CHLORHEXIDINE GLUCONATE 4 % EX LIQD
60.0000 mL | Freq: Once | CUTANEOUS | Status: AC
  Administered 2020-10-06: 4 via TOPICAL
  Filled 2020-10-05: qty 60

## 2020-10-05 MED ORDER — ACETAMINOPHEN 325 MG PO TABS
650.0000 mg | ORAL_TABLET | Freq: Four times a day (QID) | ORAL | Status: DC | PRN
  Administered 2020-10-05 – 2020-10-14 (×5): 650 mg via ORAL
  Filled 2020-10-05 (×6): qty 2

## 2020-10-05 MED ORDER — DEXTROSE 50 % IV SOLN
12.5000 g | Freq: Once | INTRAVENOUS | Status: AC
  Administered 2020-10-05: 12.5 g via INTRAVENOUS
  Filled 2020-10-05: qty 50

## 2020-10-05 MED ORDER — FLUTICASONE FUROATE-VILANTEROL 200-25 MCG/INH IN AEPB
1.0000 | INHALATION_SPRAY | Freq: Every day | RESPIRATORY_TRACT | Status: DC
  Administered 2020-10-05 – 2020-10-12 (×3): 1 via RESPIRATORY_TRACT
  Filled 2020-10-05 (×2): qty 28

## 2020-10-05 MED ORDER — LACTATED RINGERS IV BOLUS
1000.0000 mL | Freq: Once | INTRAVENOUS | Status: AC
  Administered 2020-10-05: 1000 mL via INTRAVENOUS

## 2020-10-05 MED ORDER — METOPROLOL TARTRATE 5 MG/5ML IV SOLN: 5.0000 mg | INTRAVENOUS | Status: DC | PRN

## 2020-10-05 MED ORDER — INSULIN GLARGINE 100 UNIT/ML ~~LOC~~ SOLN
50.0000 [IU] | Freq: Every day | SUBCUTANEOUS | Status: DC
  Administered 2020-10-06 – 2020-10-12 (×6): 50 [IU] via SUBCUTANEOUS
  Filled 2020-10-05 (×8): qty 0.5

## 2020-10-05 MED ORDER — SODIUM CHLORIDE 0.9% IV SOLUTION: Freq: Once | INTRAVENOUS | Status: AC

## 2020-10-05 MED ORDER — SODIUM CHLORIDE 0.9 % IV SOLN: INTRAVENOUS | Status: DC

## 2020-10-05 MED ORDER — INSULIN ASPART 100 UNIT/ML ~~LOC~~ SOLN
0.0000 [IU] | Freq: Every day | SUBCUTANEOUS | Status: DC
  Administered 2020-10-05: 2 [IU] via SUBCUTANEOUS
  Filled 2020-10-05: qty 0.05

## 2020-10-05 MED ORDER — ROSUVASTATIN CALCIUM 20 MG PO TABS
20.0000 mg | ORAL_TABLET | Freq: Every day | ORAL | Status: DC
  Administered 2020-10-05 – 2020-10-14 (×10): 20 mg via ORAL
  Filled 2020-10-05 (×11): qty 1

## 2020-10-05 MED ORDER — DEXTROSE 5 % IV SOLN
3.0000 g | INTRAVENOUS | Status: DC
  Filled 2020-10-05: qty 3000

## 2020-10-05 MED ORDER — ALBUTEROL SULFATE HFA 108 (90 BASE) MCG/ACT IN AERS
1.0000 | INHALATION_SPRAY | Freq: Two times a day (BID) | RESPIRATORY_TRACT | Status: DC
  Administered 2020-10-05 – 2020-10-11 (×12): 1 via RESPIRATORY_TRACT
  Filled 2020-10-05 (×2): qty 6.7

## 2020-10-05 MED ORDER — INSULIN ASPART 100 UNIT/ML ~~LOC~~ SOLN
0.0000 [IU] | Freq: Three times a day (TID) | SUBCUTANEOUS | Status: DC
  Administered 2020-10-05: 7 [IU] via SUBCUTANEOUS
  Administered 2020-10-05: 3 [IU] via SUBCUTANEOUS
  Administered 2020-10-06: 7 [IU] via SUBCUTANEOUS
  Administered 2020-10-06 – 2020-10-07 (×2): 4 [IU] via SUBCUTANEOUS
  Administered 2020-10-07 – 2020-10-10 (×8): 3 [IU] via SUBCUTANEOUS
  Administered 2020-10-11: 4 [IU] via SUBCUTANEOUS
  Administered 2020-10-11 – 2020-10-12 (×2): 3 [IU] via SUBCUTANEOUS
  Administered 2020-10-13: 4 [IU] via SUBCUTANEOUS
  Administered 2020-10-14: 3 [IU] via SUBCUTANEOUS
  Filled 2020-10-05: qty 0.2

## 2020-10-05 MED ORDER — VANCOMYCIN HCL IN DEXTROSE 1-5 GM/200ML-% IV SOLN
1000.0000 mg | Freq: Two times a day (BID) | INTRAVENOUS | Status: DC
  Administered 2020-10-05 – 2020-10-07 (×5): 1000 mg via INTRAVENOUS
  Filled 2020-10-05 (×6): qty 200

## 2020-10-05 MED ORDER — ALBUTEROL SULFATE (2.5 MG/3ML) 0.083% IN NEBU: 2.5000 mg | INHALATION_SOLUTION | RESPIRATORY_TRACT | Status: DC | PRN

## 2020-10-05 MED ORDER — PIPERACILLIN-TAZOBACTAM 3.375 G IVPB
3.3750 g | Freq: Three times a day (TID) | INTRAVENOUS | Status: DC
  Administered 2020-10-05 – 2020-10-07 (×7): 3.375 g via INTRAVENOUS
  Filled 2020-10-05 (×7): qty 50

## 2020-10-05 NOTE — Consult Note (Signed)
ORTHOPAEDIC CONSULTATION  REQUESTING PHYSICIAN: Swayze, Ava, DO  Chief Complaint: Ulceration abscess necrosis left foot.  HPI: Yesenia Payne is a 69 y.o. female who presents with cellulitis abscess necrotic heel ulcer left foot.  Patient states she initially tried to get her primary care physician to call in an antibiotic patient states this did not happen and she presented to was along the emergency room yesterday for treatment.  Patient states that she has had a history of ulcerations in her feet.  She states this 1 has been present for weeks.  Past Medical History:  Diagnosis Date  . Acid reflux   . Asthma   . Chronic headache   . Diabetes mellitus without complication (HCC)   . Diverticular disease   . Diverticulitis   . Dyslipidemia   . Fibromyalgia   . Hyperlipidemia   . Hypertension   . IBS (irritable bowel syndrome)   . Insomnia   . Irregular heartbeat   . Low back pain   . Morbid obesity (HCC)   . Neuropathy    distal peripheral  . Osteoarthritis   . PAC (premature atrial contraction) 01/18/2017  . Urinary incontinence   . Vitamin D deficiency    Past Surgical History:  Procedure Laterality Date  . arthoscopics    . BACK SURGERY     L4 & L5  . CESAREAN SECTION    . CESAREAN SECTION    . colonscopy  2009  . Left Knee Surgery  1993  . Right knee surgery  1990  . TOTAL KNEE ARTHROPLASTY     Social History   Socioeconomic History  . Marital status: Married    Spouse name: Not on file  . Number of children: Not on file  . Years of education: Not on file  . Highest education level: Not on file  Occupational History  . Not on file  Tobacco Use  . Smoking status: Never Smoker  . Smokeless tobacco: Never Used  Substance and Sexual Activity  . Alcohol use: No    Alcohol/week: 0.0 standard drinks  . Drug use: No  . Sexual activity: Never  Other Topics Concern  . Not on file  Social History Narrative  . Not on file   Social Determinants of  Health   Financial Resource Strain:   . Difficulty of Paying Living Expenses: Not on file  Food Insecurity:   . Worried About Programme researcher, broadcasting/film/video in the Last Year: Not on file  . Ran Out of Food in the Last Year: Not on file  Transportation Needs:   . Lack of Transportation (Medical): Not on file  . Lack of Transportation (Non-Medical): Not on file  Physical Activity:   . Days of Exercise per Week: Not on file  . Minutes of Exercise per Session: Not on file  Stress:   . Feeling of Stress : Not on file  Social Connections:   . Frequency of Communication with Friends and Family: Not on file  . Frequency of Social Gatherings with Friends and Family: Not on file  . Attends Religious Services: Not on file  . Active Member of Clubs or Organizations: Not on file  . Attends Banker Meetings: Not on file  . Marital Status: Not on file   Family History  Problem Relation Age of Onset  . Hypertension Other   . Hypertension Mother   . Hyperlipidemia Mother   . Heart attack Father   . Diabetes Father   .  CAD Father   . Neuropathy Father   . Diabetes Paternal Grandfather   . Cancer - Other Sister        uterine cancer  . Mental retardation Brother   . Leukemia Sister   . Heart attack Sister   . Other Daughter        ruptured colon x2   - negative except otherwise stated in the family history section Allergies  Allergen Reactions  . Iodinated Diagnostic Agents Hives    Other reaction(s): Hives   . Metrizamide Hives   Prior to Admission medications   Medication Sig Start Date End Date Taking? Authorizing Provider  albuterol (PROVENTIL HFA;VENTOLIN HFA) 108 (90 BASE) MCG/ACT inhaler Inhale 1 puff into the lungs 2 (two) times daily.     Yes [provider]  albuterol (PROVENTIL) (2.5 MG/3ML) 0.083% nebulizer solution Take 2.5 mg by nebulization 3 (three) times daily as needed. For shortness of breath.    Yes [provider]  amLODipine (NORVASC) 10 MG  tablet Take 10 mg by mouth daily.   Yes [provider]  desloratadine (CLARINEX) 5 MG tablet Take 5 mg by mouth daily as needed.    Yes [provider]  Fluticasone-Salmeterol (ADVAIR) 250-50 MCG/DOSE AEPB Inhale 1 puff into the lungs daily.     Yes [provider]  furosemide (LASIX) 20 MG tablet Take 40 mg by mouth 2 (two) times daily.   Yes [provider]  gabapentin (NEURONTIN) 800 MG tablet Take 800 mg by mouth 4 (four) times daily.  09/12/16  Yes [provider]  insulin glargine (LANTUS) 100 UNIT/ML injection Inject 90 Units into the skin daily.    Yes [provider]  insulin lispro (HUMALOG) 100 UNIT/ML injection Inject 47 Units into the skin 3 (three) times daily with meals.    Yes [provider]  labetalol (NORMODYNE) 100 MG tablet Take by mouth.   Yes [provider]  lisinopril (PRINIVIL,ZESTRIL) 20 MG tablet Take 20 mg by mouth daily.   Yes [provider]  potassium chloride (KLOR-CON) 10 MEQ tablet Take 10 mEq by mouth 2 (two) times daily. 06/03/20  Yes [provider]  rosuvastatin (CRESTOR) 20 MG tablet 1 tablet 2 times daily.   Yes [provider]  metoprolol succinate (TOPROL-XL) 50 MG 24 hr tablet Take 1 tablet (50 mg total) by mouth daily. Take with or immediately following a meal. Patient not taking: Reported on 10/04/2020 01/18/17 01/13/18  Quintella Reichert, MD   DG Chest Port 1 View  Result Date: 10/04/2020 CLINICAL DATA:  Diabetic ulcer to the left foot with redness radiating up the left leg. Generalized weakness. Concern for sepsis. EXAM: PORTABLE CHEST 1 VIEW COMPARISON:  12/25/2015 FINDINGS: Cardiac enlargement. Normal pulmonary vascularity. Lungs are clear. No pleural effusions. No pneumothorax. Mediastinal contours appear intact. IMPRESSION: Cardiac enlargement.  No active disease. Electronically Signed   By: Burman Nieves M.D.   On: 10/04/2020 22:35   DG Foot Complete  Left  Result Date: 10/04/2020 CLINICAL DATA:  Diabetic ulcer over the left heel with redness radiating up the left leg. EXAM: LEFT FOOT - COMPLETE 3+ VIEW COMPARISON:  02/17/2011 FINDINGS: Previous amputation of the left third toe at the metatarsal phalangeal level. Degenerative changes in the interphalangeal, metatarsal-phalangeal, and intertarsal joints. No acute fracture or dislocation. Superficial ulceration is suggested in the plantar soft tissues over the calcaneus consistent with history of ulceration. No bone changes to suggest osteomyelitis. Prominent diffuse soft tissue swelling  over the left foot with soft tissue gas over the plantar and dorsal aspects. Changes are consistent with cellulitis due to gas-forming organism. IMPRESSION: Superficial ulceration in the plantar soft tissues over the calcaneus. Soft tissue gas over the plantar and dorsal aspects of the foot consistent with cellulitis due to gas-forming organism. No evidence of osteomyelitis. Electronically Signed   By: Burman Nieves M.D.   On: 10/04/2020 22:37   - pertinent xrays, CT, MRI studies were reviewed and independently interpreted  Positive ROS: All other systems have been reviewed and were otherwise negative with the exception of those mentioned in the HPI and as above.  Physical Exam: General: Alert, no acute distress Psychiatric: Patient is competent for consent with normal mood and affect Lymphatic: No axillary or cervical lymphadenopathy Cardiovascular: No pedal edema Respiratory: No cyanosis, no use of accessory musculature GI: No organomegaly, abdomen is soft and non-tender    Images:  @ENCIMAGES @  Labs:  Lab Results  Component Value Date   ESRSEDRATE 105 (H) 10/04/2020   CRP 26.7 (H) 10/04/2020   REPTSTATUS PENDING 10/04/2020   GRAMSTAIN  10/04/2020    NO WBC SEEN MODERATE GRAM POSITIVE COCCI IN PAIRS MODERATE GRAM NEGATIVE RODS Performed at [facility], [address], [place],  [place]    CULT PENDING 10/04/2020    Lab Results  Component Value Date   ALBUMIN 2.0 (L) 10/04/2020    Neurologic: Patient does not have protective sensation bilateral lower extremities.   MUSCULOSKELETAL:   Skin: Examination patient has swelling of the left foot with cellulitis.  There is a large necrotic ulcer over the heel with purulent drainage.  She has a palpable dorsalis pedis pulse.  She has venous insufficiency with brawny skin color changes in the gaiter area of the left leg.  She is status post bilateral total knees by Dr. 10/06/2020 about 20 years ago.  Review of the radiographs shows air in the soft tissue over the heel and dorsum of the foot.  There are no destructive bony changes.  There is no crepitation or tenderness to palpation in the calf.  Assessment: Assessment: Abscess ulceration and necrotic tissue left foot.  Plan: Plan: We will plan for transtibial amputation on the left  tomorrow.  Discussed with the patient that I have ordered 2 units of packed red blood cells to improve her hemoglobin from 8 trying to get this to 10 for surgery.  Patient will also require IV antibiotics prior to surgery.  This was discussed with the patient and her family present they understand and agree and proceeding with surgery.  Thank you for the consult and the opportunity to see Ms. Renae Fickle, MD [facility] [phone] 9:59 AM

## 2020-10-05 NOTE — ED Notes (Signed)
[facility] dispatch notified for need of transport.  

## 2020-10-05 NOTE — Progress Notes (Addendum)
Pharmacy Antibiotic Note  Yesenia Payne is a 69 y.o. female admitted on 10/04/2020 with diabetic heel ulcer with gas gangrene.  Patient received Vancomycin 2gm IV x 1 in the ED.  Pharmacy has been consulted for Vancomycin dosing.    Plan:  Vancomycin 1gm IV q12h  Zosyn 3.375gm IV q8h (each dose infused over 4 hours)  Clindamycin per MD  Daily SCr while on both Vancomycin and Zosyn  F/U culture results & sensitivities  Check vancomycin trough level as needed  Height: 5' 3.25" (160.7 cm) Weight: 124.7 kg (275 lb) IBW/kg (Calculated) : 52.98  Temp (24hrs), Avg:98.8 F (37.1 C), Min:97.4 F (36.3 C), Max:100.8 F (38.2 C)  Recent Labs  Lab 10/04/20 2220  WBC 29.4*  CREATININE 1.09*  LATICACIDVEN 2.6*    Estimated Creatinine Clearance: 61.9 mL/min (A) (by C-G formula based on SCr of 1.09 mg/dL (H)).    Allergies  Allergen Reactions  . Iodinated Diagnostic Agents Hives    Other reaction(s): Hives   . Metrizamide Hives    Antimicrobials this admission: 11/27 Clindamycin >>   11/27 Zosyn >>   11/28 Vancomycin >>  Dose adjustments this admission:   Microbiology results: 11/27 BCx:   11/27 UCx:   11/27 Wound Cx:   Thank you for allowing pharmacy to be a part of this patient's care.  Maryellen Pile, PharmD 10/05/2020 12:23 AM

## 2020-10-05 NOTE — Progress Notes (Signed)
Pt arrived to the floor at approx. 0915 with  L foot necrosis. Pt skin has multiple wounds on her buttocks and put into a wound consult. Multiple skin irritation/moisture in skin folds and antifungal powder used. Pt need for bariatric bed was ordered and wound nurse came to beside to give step by step skin care. Purewick on pt and chucks pads being used. Will continue to monitor.

## 2020-10-05 NOTE — Progress Notes (Signed)
PHARMACY - PHYSICIAN COMMUNICATION CRITICAL VALUE ALERT - BLOOD CULTURE IDENTIFICATION (BCID)  Yesenia Payne is an 69 y.o. female who presented to [facility] on 10/04/2020 with a chief complaint of foot cellulitis with concern for sepsis and planning foot surgery 11/29   Name of physician (or Provider) Contacted: Dr Gerri Lins  Current antibiotics: Clindamycin 600mg  Q8h + Zosyn EI  Changes to prescribed antibiotics recommended:  Patient is on recommended antibiotics - No changes needed  Results for orders placed or performed during the hospital encounter of 10/04/20  Blood Culture ID Panel (Reflexed) (Collected: 10/04/2020 10:20 PM)  Result Value Ref Range   Enterococcus faecalis NOT DETECTED NOT DETECTED   Enterococcus Faecium NOT DETECTED NOT DETECTED   Listeria monocytogenes NOT DETECTED NOT DETECTED   Staphylococcus species NOT DETECTED NOT DETECTED   Staphylococcus aureus (BCID) NOT DETECTED NOT DETECTED   Staphylococcus epidermidis NOT DETECTED NOT DETECTED   Staphylococcus lugdunensis NOT DETECTED NOT DETECTED   Streptococcus species DETECTED (A) NOT DETECTED   Streptococcus agalactiae NOT DETECTED NOT DETECTED   Streptococcus pneumoniae NOT DETECTED NOT DETECTED   Streptococcus pyogenes NOT DETECTED NOT DETECTED   A.calcoaceticus-baumannii NOT DETECTED NOT DETECTED   Bacteroides fragilis DETECTED (A) NOT DETECTED   Enterobacterales DETECTED (A) NOT DETECTED   Enterobacter cloacae complex NOT DETECTED NOT DETECTED   Escherichia coli NOT DETECTED NOT DETECTED   Klebsiella aerogenes NOT DETECTED NOT DETECTED   Klebsiella oxytoca NOT DETECTED NOT DETECTED   Klebsiella pneumoniae NOT DETECTED NOT DETECTED   Proteus species DETECTED (A) NOT DETECTED   Salmonella species NOT DETECTED NOT DETECTED   Serratia marcescens NOT DETECTED NOT DETECTED   Haemophilus influenzae NOT DETECTED NOT DETECTED   Neisseria meningitidis NOT DETECTED NOT DETECTED   Pseudomonas aeruginosa NOT  DETECTED NOT DETECTED   Stenotrophomonas maltophilia NOT DETECTED NOT DETECTED   Candida albicans NOT DETECTED NOT DETECTED   Candida auris NOT DETECTED NOT DETECTED   Candida glabrata NOT DETECTED NOT DETECTED   Candida krusei NOT DETECTED NOT DETECTED   Candida parapsilosis NOT DETECTED NOT DETECTED   Candida tropicalis NOT DETECTED NOT DETECTED   Cryptococcus neoformans/gattii NOT DETECTED NOT DETECTED   CTX-M ESBL NOT DETECTED NOT DETECTED   Carbapenem resistance IMP NOT DETECTED NOT DETECTED   Carbapenem resistance KPC NOT DETECTED NOT DETECTED   Carbapenem resistance NDM NOT DETECTED NOT DETECTED   Carbapenem resist OXA 48 LIKE NOT DETECTED NOT DETECTED   Carbapenem resistance VIM NOT DETECTED NOT DETECTED    10/06/2020 Pharm.D. CPP, BCPS Clinical Pharmacist [phone] 10/05/2020 4:02 PM

## 2020-10-05 NOTE — Progress Notes (Signed)
Pt 2nd unit of blood was started and 15 minutes into the infusion the pt temp increased from 99.8 to 101.4- blood was paused, MD paged, tyelnol given and MD stated to retake temp which was 98.7 and told ot restart blood if temp came down to 100. Pt blood was restarted and stabilized. WiIl continue to monitor.

## 2020-10-05 NOTE — Progress Notes (Addendum)
PROGRESS NOTE  Yesenia Payne JME:[id number] DOB: 12-Sep-1951 DOA: 10/04/2020 PCP: Merri Brunette, MD  Brief History   Yesenia Payne is a 69 y.o. female with medical history significant for hypertension, iddm, hyperlipidemia presents to the ED for chief concern of left lower leg wound that started about two days ago.   She reports she only noticed the wound 2 days ago.  And that she presented to the emergency department because the wound burst today.  She states the leg has been red and swollen for about 1-2 weeks. She denies presence of wounds to her knowledge prior to two days ago.  She denies trauma to the lower extremity.  ROS was negative for headache, vision changes, dysphagia, odynophagia, cough, chest pain, shortness of breath, abdominal pain, dysuria, hematuria, diarrhea, blood in her stool, constipation.  X-ray of the left lower extremity demonstrate gas in the dorsal and plantar aspects of the foot and heel.  [facility] were consulted to admit the patient for further evaluation and care.   The patient was started on IV Ancef and Clindamycin. She was transferred to [facility]. Dr. Lajoyce Corners has been consulted. He has evaluated the patient. Surgery is planned for tomorrow.  Consultants  . Orthopedic surgery  Procedures  . None  Antibiotics   Anti-infectives (From admission, onward)   Start     Dose/Rate Route Frequency Ordered Stop   10/06/20 0600  ceFAZolin (ANCEF) 3 g in dextrose 5 % 50 mL IVPB        3 g 100 mL/hr over 30 Minutes Intravenous On call to O.R. 10/05/20 1040 10/07/20 0559   10/05/20 1200  vancomycin (VANCOCIN) IVPB 1000 mg/200 mL premix        1,000 mg 200 mL/hr over 60 Minutes Intravenous Every 12 hours 10/05/20 0022     10/05/20 1115  ceFAZolin (ANCEF) 3 g in dextrose 5 % 50 mL IVPB  Status:  Discontinued        3 g 100 mL/hr over 30 Minutes Intravenous On call to O.R. 10/05/20 1017 10/05/20 1040   10/05/20 0800   clindamycin (CLEOCIN) IVPB 600 mg        600 mg 100 mL/hr over 30 Minutes Intravenous Every 8 hours 10/04/20 2359     10/05/20 0600  piperacillin-tazobactam (ZOSYN) IVPB 3.375 g        3.375 g 12.5 mL/hr over 240 Minutes Intravenous Every 8 hours 10/05/20 0004     10/05/20 0000  piperacillin-tazobactam (ZOSYN) IVPB 3.375 g  Status:  Discontinued        3.375 g 100 mL/hr over 30 Minutes Intravenous Every 8 hours 10/04/20 2359 10/05/20 0003   10/04/20 2300  piperacillin-tazobactam (ZOSYN) IVPB 3.375 g        3.375 g 100 mL/hr over 30 Minutes Intravenous STAT 10/04/20 2246 10/04/20 2327   10/04/20 2300  vancomycin (VANCOREADY) IVPB 2000 mg/400 mL        2,000 mg 200 mL/hr over 120 Minutes Intravenous STAT 10/04/20 2246 10/05/20 0231   10/04/20 2300  clindamycin (CLEOCIN) IVPB 600 mg        600 mg 100 mL/hr over 30 Minutes Intravenous  Once 10/04/20 2249 10/05/20 0006    .  Subjective  The patient is resting comfortably. No new complaints.  Objective   Vitals:  Vitals:   10/05/20 1159 10/05/20 1400  BP: (!) 109/59 118/79  Pulse: (!) 106 100  Resp: 16 (!) 24  Temp: 98.1 F (36.7 C) 98.1 F (  36.7 C)  SpO2: 99% 99%   Exam:  Constitutional:  . The patient is awake, alert, and oriented x 3. No acute distress. Respiratory:  . No increased work of breathing. . No wheezes, rales, or rhonchi . No tactile fremitus Cardiovascular:  . Regular rate and rhythm . No murmurs, ectopy, or gallups. . No lateral PMI. No thrills. Abdomen:  . Abdomen is soft, non-tender, non-distended . No hernias, masses, or organomegaly . Normoactive bowel sounds.  Musculoskeletal:  . No cyanosis, clubbing, or edema . Left foot is foul smelling with large necrotic appearing ulceration to the left heel.  Skin:  . No rashes, lesions, ulcers . palpation of skin: no induration or nodules Neurologic:  . CN 2-12 intact . Sensation all 4 extremities intact Psychiatric:  . Mental status o Mood, affect  appropriate o Orientation to person, place, time  . judgment and insight appear intact  I have personally reviewed the following:   Today's Data  . Vitals, CBC, BMP  Micro Data  . Blood cultures x 2: 1/2 has grown out GNR. Marland Kitchen Urine Culture: has had no growth.  Imaging  . XR of the left lower extremity/foot.  Scheduled Meds: . albuterol  1 puff Inhalation BID  . chlorhexidine  60 mL Topical Once  . fluticasone furoate-vilanterol  1 puff Inhalation Daily  . insulin aspart  0-20 Units Subcutaneous TID WC  . insulin aspart  0-5 Units Subcutaneous QHS  . [START ON 10/06/2020] insulin glargine  50 Units Subcutaneous Daily  . rosuvastatin  20 mg Oral Daily   Continuous Infusions: . [START ON 10/06/2020]  ceFAZolin (ANCEF) IV    . clindamycin (CLEOCIN) IV 600 mg (10/05/20 1028)  . lactated ringers 150 mL/hr at 10/05/20 0736  . piperacillin-tazobactam (ZOSYN)  IV 3.375 g (10/05/20 1348)  . vancomycin 1,000 mg (10/05/20 1123)    Active Problems:   Necrotizing cellulitis   Sepsis (HCC)   Cutaneous abscess of left foot   Anemia   Pressure injury of skin   LOS: 1 day   A & P  Necrotizing cellulitis/cutaneous abscess of the left foot: The patient is on IV Ancef and Clindamycin.  Dr. Lajoyce Corners has been consulted and will take the patient to surgery on 10/06/2020.  Gram negative bacteremia: 1/2 blood culture has grown out GNR. The patient is receiving IV ancef and clindamycin. Await ID and susceptibilities.  Septic shock: Due to necrotizing cellulitis with hypotension, tachycardia, leukocytosis, and lactic acidosis. Improving with sepsis protocol and volume resuscitation. ] DM II: Blood sugars will be managed with Lantus 50 units sub Q daily and FSBS and SSI.   Anemia: Likely of chronic disease and dilutional effect. Monitor and check FOBT.   Hypertension: Blood pressures are currently low. Amlodipine and labetalol will be held.  I have seen and examined this patient myself. I have  spent 35 minutes in her evaluation and car.e  DVT Prophylaxis: Ted hose on unaffected limb CODE STATUS: Full Code Family Communication: Family at bedside Disposition:  Status is: Inpatient  Remains inpatient appropriate because:Inpatient level of care appropriate due to severity of illness   Dispo: The patient is from: Home              Anticipated d/c is to: TBD              Anticipated d/c date is: 2 days              Patient currently is not medically stable to d/c.  Bilan Tedesco, DO [facility] Direct contact: see [email or web address]  7PM-7AM contact night coverage as above 10/05/2020, 3:20 PM  LOS: 1 day   ADDENDUM: 1/4 BC positive for strep, proteus, and B frag. Continue Zosyn and clinda. Monitor for susceptibilities.

## 2020-10-05 NOTE — Progress Notes (Signed)
Septic pt has now had consult with critical care, additional fluids given, per chart weight is 124.7kg for female 49F 3in

## 2020-10-05 NOTE — H&P (View-Only) (Signed)
ORTHOPAEDIC CONSULTATION  REQUESTING PHYSICIAN: Swayze, Ava, DO  Chief Complaint: Ulceration abscess necrosis left foot.  HPI: Yesenia Payne is a 69 y.o. female who presents with cellulitis abscess necrotic heel ulcer left foot.  Patient states she initially tried to get her primary care physician to call in an antibiotic patient states this did not happen and she presented to was along the emergency room yesterday for treatment.  Patient states that she has had a history of ulcerations in her feet.  She states this 1 has been present for weeks.  Past Medical History:  Diagnosis Date  . Acid reflux   . Asthma   . Chronic headache   . Diabetes mellitus without complication (HCC)   . Diverticular disease   . Diverticulitis   . Dyslipidemia   . Fibromyalgia   . Hyperlipidemia   . Hypertension   . IBS (irritable bowel syndrome)   . Insomnia   . Irregular heartbeat   . Low back pain   . Morbid obesity (HCC)   . Neuropathy    distal peripheral  . Osteoarthritis   . PAC (premature atrial contraction) 01/18/2017  . Urinary incontinence   . Vitamin D deficiency    Past Surgical History:  Procedure Laterality Date  . arthoscopics    . BACK SURGERY     L4 & L5  . CESAREAN SECTION    . CESAREAN SECTION    . colonscopy  2009  . Left Knee Surgery  1993  . Right knee surgery  1990  . TOTAL KNEE ARTHROPLASTY     Social History   Socioeconomic History  . Marital status: Married    Spouse name: Not on file  . Number of children: Not on file  . Years of education: Not on file  . Highest education level: Not on file  Occupational History  . Not on file  Tobacco Use  . Smoking status: Never Smoker  . Smokeless tobacco: Never Used  Substance and Sexual Activity  . Alcohol use: No    Alcohol/week: 0.0 standard drinks  . Drug use: No  . Sexual activity: Never  Other Topics Concern  . Not on file  Social History Narrative  . Not on file   Social Determinants of  Health   Financial Resource Strain:   . Difficulty of Paying Living Expenses: Not on file  Food Insecurity:   . Worried About Programme researcher, broadcasting/film/video in the Last Year: Not on file  . Ran Out of Food in the Last Year: Not on file  Transportation Needs:   . Lack of Transportation (Medical): Not on file  . Lack of Transportation (Non-Medical): Not on file  Physical Activity:   . Days of Exercise per Week: Not on file  . Minutes of Exercise per Session: Not on file  Stress:   . Feeling of Stress : Not on file  Social Connections:   . Frequency of Communication with Friends and Family: Not on file  . Frequency of Social Gatherings with Friends and Family: Not on file  . Attends Religious Services: Not on file  . Active Member of Clubs or Organizations: Not on file  . Attends Banker Meetings: Not on file  . Marital Status: Not on file   Family History  Problem Relation Age of Onset  . Hypertension Other   . Hypertension Mother   . Hyperlipidemia Mother   . Heart attack Father   . Diabetes Father   .  CAD Father   . Neuropathy Father   . Diabetes Paternal Grandfather   . Cancer - Other Sister        uterine cancer  . Mental retardation Brother   . Leukemia Sister   . Heart attack Sister   . Other Daughter        ruptured colon x2   - negative except otherwise stated in the family history section Allergies  Allergen Reactions  . Iodinated Diagnostic Agents Hives    Other reaction(s): Hives   . Metrizamide Hives   Prior to Admission medications   Medication Sig Start Date End Date Taking? Authorizing Provider  albuterol (PROVENTIL HFA;VENTOLIN HFA) 108 (90 BASE) MCG/ACT inhaler Inhale 1 puff into the lungs 2 (two) times daily.     Yes [provider]  albuterol (PROVENTIL) (2.5 MG/3ML) 0.083% nebulizer solution Take 2.5 mg by nebulization 3 (three) times daily as needed. For shortness of breath.    Yes [provider]  amLODipine (NORVASC) 10 MG  tablet Take 10 mg by mouth daily.   Yes [provider]  desloratadine (CLARINEX) 5 MG tablet Take 5 mg by mouth daily as needed.    Yes [provider]  Fluticasone-Salmeterol (ADVAIR) 250-50 MCG/DOSE AEPB Inhale 1 puff into the lungs daily.     Yes [provider]  furosemide (LASIX) 20 MG tablet Take 40 mg by mouth 2 (two) times daily.   Yes [provider]  gabapentin (NEURONTIN) 800 MG tablet Take 800 mg by mouth 4 (four) times daily.  09/12/16  Yes [provider]  insulin glargine (LANTUS) 100 UNIT/ML injection Inject 90 Units into the skin daily.    Yes [provider]  insulin lispro (HUMALOG) 100 UNIT/ML injection Inject 47 Units into the skin 3 (three) times daily with meals.    Yes [provider]  labetalol (NORMODYNE) 100 MG tablet Take by mouth.   Yes [provider]  lisinopril (PRINIVIL,ZESTRIL) 20 MG tablet Take 20 mg by mouth daily.   Yes [provider]  potassium chloride (KLOR-CON) 10 MEQ tablet Take 10 mEq by mouth 2 (two) times daily. 06/03/20  Yes [provider]  rosuvastatin (CRESTOR) 20 MG tablet 1 tablet 2 times daily.   Yes [provider]  metoprolol succinate (TOPROL-XL) 50 MG 24 hr tablet Take 1 tablet (50 mg total) by mouth daily. Take with or immediately following a meal. Patient not taking: Reported on 10/04/2020 01/18/17 01/13/18  Quintella Reichert, MD   DG Chest Port 1 View  Result Date: 10/04/2020 CLINICAL DATA:  Diabetic ulcer to the left foot with redness radiating up the left leg. Generalized weakness. Concern for sepsis. EXAM: PORTABLE CHEST 1 VIEW COMPARISON:  12/25/2015 FINDINGS: Cardiac enlargement. Normal pulmonary vascularity. Lungs are clear. No pleural effusions. No pneumothorax. Mediastinal contours appear intact. IMPRESSION: Cardiac enlargement.  No active disease. Electronically Signed   By: Burman Nieves M.D.   On: 10/04/2020 22:35   DG Foot Complete  Left  Result Date: 10/04/2020 CLINICAL DATA:  Diabetic ulcer over the left heel with redness radiating up the left leg. EXAM: LEFT FOOT - COMPLETE 3+ VIEW COMPARISON:  02/17/2011 FINDINGS: Previous amputation of the left third toe at the metatarsal phalangeal level. Degenerative changes in the interphalangeal, metatarsal-phalangeal, and intertarsal joints. No acute fracture or dislocation. Superficial ulceration is suggested in the plantar soft tissues over the calcaneus consistent with history of ulceration. No bone changes to suggest osteomyelitis. Prominent diffuse soft tissue swelling  over the left foot with soft tissue gas over the plantar and dorsal aspects. Changes are consistent with cellulitis due to gas-forming organism. IMPRESSION: Superficial ulceration in the plantar soft tissues over the calcaneus. Soft tissue gas over the plantar and dorsal aspects of the foot consistent with cellulitis due to gas-forming organism. No evidence of osteomyelitis. Electronically Signed   By: Burman Nieves M.D.   On: 10/04/2020 22:37   - pertinent xrays, CT, MRI studies were reviewed and independently interpreted  Positive ROS: All other systems have been reviewed and were otherwise negative with the exception of those mentioned in the HPI and as above.  Physical Exam: General: Alert, no acute distress Psychiatric: Patient is competent for consent with normal mood and affect Lymphatic: No axillary or cervical lymphadenopathy Cardiovascular: No pedal edema Respiratory: No cyanosis, no use of accessory musculature GI: No organomegaly, abdomen is soft and non-tender    Images:  @ENCIMAGES @  Labs:  Lab Results  Component Value Date   ESRSEDRATE 105 (H) 10/04/2020   CRP 26.7 (H) 10/04/2020   REPTSTATUS PENDING 10/04/2020   GRAMSTAIN  10/04/2020    NO WBC SEEN MODERATE GRAM POSITIVE COCCI IN PAIRS MODERATE GRAM NEGATIVE RODS Performed at [facility], [address], [place],  [place]    CULT PENDING 10/04/2020    Lab Results  Component Value Date   ALBUMIN 2.0 (L) 10/04/2020    Neurologic: Patient does not have protective sensation bilateral lower extremities.   MUSCULOSKELETAL:   Skin: Examination patient has swelling of the left foot with cellulitis.  There is a large necrotic ulcer over the heel with purulent drainage.  She has a palpable dorsalis pedis pulse.  She has venous insufficiency with brawny skin color changes in the gaiter area of the left leg.  She is status post bilateral total knees by Dr. 10/06/2020 about 20 years ago.  Review of the radiographs shows air in the soft tissue over the heel and dorsum of the foot.  There are no destructive bony changes.  There is no crepitation or tenderness to palpation in the calf.  Assessment: Assessment: Abscess ulceration and necrotic tissue left foot.  Plan: Plan: We will plan for transtibial amputation on the left  tomorrow.  Discussed with the patient that I have ordered 2 units of packed red blood cells to improve her hemoglobin from 8 trying to get this to 10 for surgery.  Patient will also require IV antibiotics prior to surgery.  This was discussed with the patient and her family present they understand and agree and proceeding with surgery.  Thank you for the consult and the opportunity to see Ms. Renae Fickle, MD [facility] [phone] 9:59 AM

## 2020-10-05 NOTE — ED Notes (Addendum)
IV team notified for critical IV needed. Request denied.

## 2020-10-05 NOTE — Consult Note (Signed)
WOC Nurse Consult Note: Reason for Consult: multiple pressure injuries and left heel Left heel to be addressed by orthopedics; scheduled amputation  Wound type: 1-2. Bruising noted over the posterior left thigh and left calf unclear etiology 3. Deep tissue pressure injury right ischial tuberosity; 100% dark purple non blanchable tissue 4. Unstageable pressure injury sacrum; 50% pink/50% black   Pressure Injury POA: Yes Measurement: Right ischium: 11cm x 6cm x 0cm  Sacrum: 9cm x 5cm x 0.2cm  Wound bed: see above Drainage (amount, consistency, odor) minimal, serosanguinous  Periwound: intact  Dressing procedure/placement/frequency: 1. Enzymatic debridement ointment to the sacrum daily 2. Silicone foam to the right ischium and left thigh 3. Bariatric air mattress for moisture management and pressure redistribution 4. Ortho to manage left heel wound  Discussed POC with patient and bedside nurse.  Re consult if needed, will not follow at this time. Thanks  Dandrea Widdowson [employer], RN,CWOCN, CNS, CWON-AP [phone])

## 2020-10-05 NOTE — Consult Note (Signed)
NAME:  Yesenia Payne, MRN:  [id number], DOB:  28-Aug-1951, LOS: 1 ADMISSION DATE:  10/04/2020, CONSULTATION DATE:  10/05/20 REFERRING MD:  Dr. Londell Moh, CHIEF COMPLAINT:  Sepsis from left foot ulcer, concern for necrotizing fasciitis  Brief History   [name] is a 69 yo woman with a history of DM type 2, Morbid Obesity, HLD, and fibromyalgia who presented to [facility] for evaluation of left foot ulcer. Xray of foot demonstrated soft tissue gas concerning for cellulitis due to a gas-forming organism. Patient with borderline blood pressures concerning for septic shock  History of present illness   [name] is a 69 yo woman with a history of DM type 2, Morbid Obesity, HLD, and fibromyalgia who presented to [facility] for evaluation of left foot ulcer. She noted draining of the left foot wound about two days ago.   In the ED, xray of her foot demonstrated soft tissue gas over the plantar and dorsal aspects of the foot consistent with cellulitis due to a gas-forming organism. She was started on vancomycin, Zosyne, and clindamycin and Orthopedic surgery was consulted who recommended she be transferred to [facility], kept NPO with plans for further evaluation in the morning. She was fluid resuscitated with 2L of IVF (1L NS and 1L LR) and started on Vanc, Zosyn and Clindamycin. She was noted to be in AFRVR, rate control medications were withheld given septic presentation.   Past Medical History  Diabetes type 2 Morbid Obesity Hyperlipidemia Fibromyalgia  Significant Hospital Events   10/05/2020 Admitted to ICU    Consults:  Orthopedic surgery  Procedures:  NA  Significant Diagnostic Tests:   Xray Left Foot: Superficial ulceration in the plantar soft tissues over the calcaneus. Soft tissue gas over the plantar and dorsal aspects of the foot consistent with cellulitis due to gas-forming organism. No evidence of osteomyelitis.  Micro Data:   10/05/20 Blood culture: 10/05/20 wound  culture: 10/05/20 urine culture:  Antimicrobials:   Vancomycin- 10/05/20 Zosyn- 10/05/20 Clindamycin- 10/05/20   Objective   Blood pressure (!) 94/53, pulse (!) 124, temperature 99.2 F (37.3 C), temperature source Oral, resp. rate (!) 22, height 5' 3.25" (1.607 m), weight 124.7 kg, SpO2 96 %.       No intake or output data in the 24 hours ending 10/05/20 0124 Filed Weights   10/04/20 2136  Weight: 124.7 kg     Resolved Hospital Problem list   NA  Assessment & Plan:  [name] is a 69 yo woman with a history of DM type 2, Morbid Obesity, HLD, and fibromyalgia who presented to [facility] for evaluation of left foot ulcer. Xray of foot demonstrated soft tissue gas concerning for cellulitis due to a gas-forming organism. Patient with borderline blood pressures concerning for septic shock  #ID: Septic shock and concern for necrotizing cellulitis due to diabetic foot ulcer: LRINEC score of 10, indicating high risk for necrotizing soft tissue infection -Follow up blood and wound cultures -Continue antibiotic coverage with Vanc, Zosyn and clindamycin  -May need vasopressor support to maintain MAPs >65 -NPO per orthopedic surgery, appreciate assistance -Dr. Lajoyce Payne will be consulting surgeon  #Cardiac: Atrial fibrillation with RVR: Likely secondary to sepsis -Telemetry -Treatment of sepsis as above  History of HTN: -Hold home antihypertensives in the setting of septic shock  #Renal: AKI: Likely due to septic shock -Creatinine 1.09 from baseline of about 0.7 -Volume resuscitate, treat septic shock as above -Avoid nephrotoxic agents  #Heme: Anemia: Hemoglobin 10.3 -Continue to  monitor H&H, no signs of bleeding  #Endocrine: Insulin-dependent diabetes mellitus: -patient takes glargine 100 units subcutaneous daily -As she is n.p.o., her home insulin glargine is reduced to 50 units starting tomorrow -Insulin SSI  #Neuro: No active issues  #Pulmonary: No active  issues  #GI: No active issues  #GU: No active issues  Best practice (evaluated daily)   Diet: NPO pending possible procedure Pain/Anxiety/Delirium protocol (if indicated): NA VAP protocol (if indicated): NA DVT prophylaxis: Hold due to likely need for surgical intervention GI prophylaxis: NA Glucose control: As above Mobility: PT/OT when able last date of multidisciplinary goals of care discussion: NA Family and staff present: NA Summary of discussion: NA Follow up goals of care discussion due: NA Code Status: Full Disposition: ICU status  Labs   CBC: Recent Labs  Lab 10/04/20 2220  WBC 29.4*  NEUTROABS 25.0*  HGB 10.3*  HCT 34.1*  MCV 91.4  PLT 344    Basic Metabolic Panel: Recent Labs  Lab 10/04/20 2220  NA 134*  K 3.9  CL 101  CO2 22  GLUCOSE 103*  BUN 28*  CREATININE 1.09*  CALCIUM 8.2*   GFR: Estimated Creatinine Clearance: 61.9 mL/min (A) (by C-G formula based on SCr of 1.09 mg/dL (H)). Recent Labs  Lab 10/04/20 2220 10/05/20 0016  WBC 29.4*  --   LATICACIDVEN 2.6* 2.2*    Liver Function Tests: Recent Labs  Lab 10/04/20 2220  AST 65*  ALT 35  ALKPHOS 164*  BILITOT 1.0  PROT 7.4  ALBUMIN 2.0*   No results for input(s): LIPASE, AMYLASE in the last 168 hours. No results for input(s): AMMONIA in the last 168 hours.  ABG    Component Value Date/Time   HCO3 27.7 (H) 05/18/2007 1351   TCO2 29 05/18/2007 1351     Coagulation Profile: Recent Labs  Lab 10/04/20 2220  INR 1.3*    Cardiac Enzymes: No results for input(s): CKTOTAL, CKMB, CKMBINDEX, TROPONINI in the last 168 hours.  HbA1C: No results found for: HGBA1C  CBG: No results for input(s): GLUCAP in the last 168 hours.   Past Medical History  She,  has a past medical history of Acid reflux, Asthma, Chronic headache, Diabetes mellitus without complication (HCC), Diverticular disease, Diverticulitis, Dyslipidemia, Fibromyalgia, Hyperlipidemia, Hypertension, IBS  (irritable bowel syndrome), Insomnia, Irregular heartbeat, Low back pain, Morbid obesity (HCC), Neuropathy, Osteoarthritis, PAC (premature atrial contraction) (01/18/2017), Urinary incontinence, and Vitamin D deficiency.   Surgical History    Past Surgical History:  Procedure Laterality Date  . arthoscopics    . BACK SURGERY     L4 & L5  . CESAREAN SECTION    . CESAREAN SECTION    . colonscopy  2009  . Left Knee Surgery  1993  . Right knee surgery  1990  . TOTAL KNEE ARTHROPLASTY       Social History   reports that she has never smoked. She has never used smokeless tobacco. She reports that she does not drink alcohol and does not use drugs.   Family History   Her family history includes CAD in her father; Cancer - Other in her sister; Diabetes in her father and paternal grandfather; Heart attack in her father and sister; Hyperlipidemia in her mother; Hypertension in her mother and another family member; Leukemia in her sister; Mental retardation in her brother; Neuropathy in her father; Other in her daughter.   Allergies Allergies  Allergen Reactions  . Iodinated Diagnostic Agents Hives    Other reaction(s): Hives   .  Metrizamide Hives     Home Medications  Prior to Admission medications   Medication Sig Start Date End Date Taking? Authorizing Provider  albuterol (PROVENTIL HFA;VENTOLIN HFA) 108 (90 BASE) MCG/ACT inhaler Inhale 1 puff into the lungs 2 (two) times daily.     Yes [provider]  albuterol (PROVENTIL) (2.5 MG/3ML) 0.083% nebulizer solution Take 2.5 mg by nebulization 3 (three) times daily as needed. For shortness of breath.    Yes [provider]  amLODipine (NORVASC) 10 MG tablet Take 10 mg by mouth daily.   Yes [provider]  desloratadine (CLARINEX) 5 MG tablet Take 5 mg by mouth daily as needed.    Yes [provider]  Fluticasone-Salmeterol (ADVAIR) 250-50 MCG/DOSE AEPB Inhale 1 puff into the lungs daily.     Yes  [provider]  furosemide (LASIX) 20 MG tablet Take 40 mg by mouth 2 (two) times daily.   Yes [provider]  gabapentin (NEURONTIN) 800 MG tablet Take 800 mg by mouth 4 (four) times daily.  09/12/16  Yes [provider]  insulin glargine (LANTUS) 100 UNIT/ML injection Inject 90 Units into the skin daily.    Yes [provider]  insulin lispro (HUMALOG) 100 UNIT/ML injection Inject 47 Units into the skin 3 (three) times daily with meals.    Yes [provider]  labetalol (NORMODYNE) 100 MG tablet Take by mouth.   Yes [provider]  lisinopril (PRINIVIL,ZESTRIL) 20 MG tablet Take 20 mg by mouth daily.   Yes [provider]  potassium chloride (KLOR-CON) 10 MEQ tablet Take 10 mEq by mouth 2 (two) times daily. 06/03/20  Yes [provider]  rosuvastatin (CRESTOR) 20 MG tablet 1 tablet 2 times daily.   Yes [provider]  metoprolol succinate (TOPROL-XL) 50 MG 24 hr tablet Take 1 tablet (50 mg total) by mouth daily. Take with or immediately following a meal. Patient not taking: Reported on 10/04/2020 01/18/17 01/13/18  Quintella Reichert, MD

## 2020-10-05 NOTE — Plan of Care (Signed)
Progressing, will continue to monitor.  

## 2020-10-05 NOTE — Progress Notes (Incomplete)
Pt arrived to the floor transfer from [facility]. Pt

## 2020-10-06 ENCOUNTER — Inpatient Hospital Stay (HOSPITAL_COMMUNITY): Payer: Medicare Other | Admitting: Anesthesiology

## 2020-10-06 ENCOUNTER — Encounter (HOSPITAL_COMMUNITY)
Admission: EM | Disposition: A | Discharge status: Home Health | Payer: Self-pay | Source: Home / Self Care | Attending: Internal Medicine

## 2020-10-06 DIAGNOSIS — D649 Anemia, unspecified: Secondary | ICD-10-CM | POA: Diagnosis not present

## 2020-10-06 DIAGNOSIS — R7881 Bacteremia: Secondary | ICD-10-CM | POA: Diagnosis not present

## 2020-10-06 DIAGNOSIS — L89624 Pressure ulcer of left heel, stage 4: Secondary | ICD-10-CM

## 2020-10-06 DIAGNOSIS — L02612 Cutaneous abscess of left foot: Secondary | ICD-10-CM | POA: Diagnosis not present

## 2020-10-06 DIAGNOSIS — A419 Sepsis, unspecified organism: Secondary | ICD-10-CM | POA: Diagnosis not present

## 2020-10-06 DIAGNOSIS — L0390 Cellulitis, unspecified: Secondary | ICD-10-CM | POA: Diagnosis not present

## 2020-10-06 HISTORY — PX: AMPUTATION: SHX166

## 2020-10-06 LAB — CREATININE, SERUM
Creatinine, Ser: 1 mg/dL (ref 0.44–1.00)
GFR, Estimated: >60 mL/min (ref >60)

## 2020-10-06 LAB — PREPARE RBC (CROSSMATCH)

## 2020-10-06 LAB — GLUCOSE, CAPILLARY
Glucose-Capillary: 200 mg/dL — ABNORMAL HIGH (ref 70–99)
Glucose-Capillary: 170 mg/dL — ABNORMAL HIGH (ref 70–99)
Glucose-Capillary: 163 mg/dL — ABNORMAL HIGH (ref 70–99)
Glucose-Capillary: 186 mg/dL — ABNORMAL HIGH (ref 70–99)
Glucose-Capillary: 217 mg/dL — ABNORMAL HIGH (ref 70–99)
Glucose-Capillary: 169 mg/dL — ABNORMAL HIGH (ref 70–99)

## 2020-10-06 LAB — URINE CULTURE

## 2020-10-06 SURGERY — AMPUTATION BELOW KNEE
Anesthesia: General | Site: Knee | Laterality: Left

## 2020-10-06 MED ORDER — METHOCARBAMOL 1000 MG/10ML IJ SOLN
500.0000 mg | Freq: Four times a day (QID) | INTRAVENOUS | Status: DC | PRN
  Filled 2020-10-06: qty 5

## 2020-10-06 MED ORDER — MEPERIDINE HCL 25 MG/ML IJ SOLN: 6.2500 mg | INTRAMUSCULAR | Status: DC | PRN

## 2020-10-06 MED ORDER — LIDOCAINE 2% (20 MG/ML) 5 ML SYRINGE
INTRAMUSCULAR | Status: DC | PRN
  Administered 2020-10-06: 100 mg via INTRAVENOUS

## 2020-10-06 MED ORDER — METOCLOPRAMIDE HCL 5 MG/ML IJ SOLN: 5.0000 mg | Freq: Three times a day (TID) | INTRAMUSCULAR | Status: DC | PRN

## 2020-10-06 MED ORDER — KETAMINE HCL 10 MG/ML IJ SOLN
INTRAMUSCULAR | Status: DC | PRN
  Administered 2020-10-06: 20 mg via INTRAVENOUS

## 2020-10-06 MED ORDER — OXYCODONE HCL 5 MG/5ML PO SOLN: 5.0000 mg | Freq: Once | ORAL | Status: DC | PRN

## 2020-10-06 MED ORDER — METOCLOPRAMIDE HCL 5 MG PO TABS: 5.0000 mg | ORAL_TABLET | Freq: Three times a day (TID) | ORAL | Status: DC | PRN

## 2020-10-06 MED ORDER — ACETAMINOPHEN 325 MG PO TABS: 325.0000 mg | ORAL_TABLET | ORAL | Status: DC | PRN

## 2020-10-06 MED ORDER — OXYCODONE HCL 5 MG/5ML PO SOLN: 5.0000 mg | Freq: Once | ORAL | Status: AC | PRN

## 2020-10-06 MED ORDER — OXYCODONE HCL 5 MG PO TABS
5.0000 mg | ORAL_TABLET | ORAL | Status: DC | PRN
  Administered 2020-10-06 – 2020-10-08 (×10): 5 mg via ORAL
  Filled 2020-10-06 (×11): qty 1

## 2020-10-06 MED ORDER — OXYCODONE HCL 5 MG PO TABS: 5.0000 mg | ORAL_TABLET | Freq: Once | ORAL | Status: DC | PRN

## 2020-10-06 MED ORDER — HYDROMORPHONE HCL 1 MG/ML IJ SOLN
1.0000 mg | INTRAMUSCULAR | Status: DC | PRN
  Administered 2020-10-06 – 2020-10-12 (×18): 1 mg via INTRAVENOUS
  Filled 2020-10-06 (×19): qty 1

## 2020-10-06 MED ORDER — ONDANSETRON HCL 4 MG/2ML IJ SOLN: 4.0000 mg | Freq: Once | INTRAMUSCULAR | Status: DC | PRN

## 2020-10-06 MED ORDER — LACTATED RINGERS IV SOLN: INTRAVENOUS | Status: DC | PRN

## 2020-10-06 MED ORDER — PROPOFOL 10 MG/ML IV BOLUS
INTRAVENOUS | Status: AC
  Filled 2020-10-06: qty 20

## 2020-10-06 MED ORDER — BISACODYL 10 MG RE SUPP: 10.0000 mg | Freq: Every day | RECTAL | Status: DC | PRN

## 2020-10-06 MED ORDER — ONDANSETRON HCL 4 MG PO TABS: 4.0000 mg | ORAL_TABLET | Freq: Four times a day (QID) | ORAL | Status: DC | PRN

## 2020-10-06 MED ORDER — SODIUM CHLORIDE 0.9 % IV SOLN: INTRAVENOUS | Status: DC | PRN

## 2020-10-06 MED ORDER — OXYCODONE HCL 5 MG PO TABS
5.0000 mg | ORAL_TABLET | Freq: Once | ORAL | Status: AC | PRN
  Administered 2020-10-06: 5 mg via ORAL

## 2020-10-06 MED ORDER — DOCUSATE SODIUM 100 MG PO CAPS
100.0000 mg | ORAL_CAPSULE | Freq: Two times a day (BID) | ORAL | Status: DC
  Administered 2020-10-06 – 2020-10-12 (×8): 100 mg via ORAL
  Filled 2020-10-06 (×13): qty 1

## 2020-10-06 MED ORDER — FENTANYL CITRATE (PF) 100 MCG/2ML IJ SOLN
INTRAMUSCULAR | Status: AC
  Administered 2020-10-06: 50 ug via INTRAVENOUS
  Filled 2020-10-06: qty 2

## 2020-10-06 MED ORDER — FENTANYL CITRATE (PF) 100 MCG/2ML IJ SOLN: 25.0000 ug | INTRAMUSCULAR | Status: DC | PRN

## 2020-10-06 MED ORDER — PHENYLEPHRINE HCL-NACL 10-0.9 MG/250ML-% IV SOLN
INTRAVENOUS | Status: DC | PRN
  Administered 2020-10-06: 25 ug/min via INTRAVENOUS

## 2020-10-06 MED ORDER — ONDANSETRON HCL 4 MG/2ML IJ SOLN
INTRAMUSCULAR | Status: DC | PRN
  Administered 2020-10-06: 4 mg via INTRAVENOUS

## 2020-10-06 MED ORDER — FENTANYL CITRATE (PF) 250 MCG/5ML IJ SOLN
INTRAMUSCULAR | Status: DC | PRN
  Administered 2020-10-06 (×3): 50 ug via INTRAVENOUS

## 2020-10-06 MED ORDER — ONDANSETRON HCL 4 MG/2ML IJ SOLN: 4.0000 mg | Freq: Four times a day (QID) | INTRAMUSCULAR | Status: DC | PRN

## 2020-10-06 MED ORDER — OXYCODONE HCL 5 MG PO TABS
ORAL_TABLET | ORAL | Status: AC
  Administered 2020-10-06: 5 mg via ORAL
  Filled 2020-10-06: qty 1

## 2020-10-06 MED ORDER — PHENYLEPHRINE HCL (PRESSORS) 10 MG/ML IV SOLN
INTRAVENOUS | Status: DC | PRN
  Administered 2020-10-06 (×2): 80 ug via INTRAVENOUS

## 2020-10-06 MED ORDER — FENTANYL CITRATE (PF) 250 MCG/5ML IJ SOLN
INTRAMUSCULAR | Status: AC
  Filled 2020-10-06: qty 5

## 2020-10-06 MED ORDER — METHOCARBAMOL 500 MG PO TABS
ORAL_TABLET | ORAL | Status: AC
  Administered 2020-10-06: 500 mg via ORAL
  Filled 2020-10-06: qty 1

## 2020-10-06 MED ORDER — POLYETHYLENE GLYCOL 3350 17 G PO PACK
17.0000 g | PACK | Freq: Every day | ORAL | Status: DC | PRN
  Administered 2020-10-07: 17 g via ORAL
  Filled 2020-10-06: qty 1

## 2020-10-06 MED ORDER — CHLORHEXIDINE GLUCONATE 0.12 % MT SOLN: 15.0000 mL | Freq: Once | OROMUCOSAL | Status: AC

## 2020-10-06 MED ORDER — KETAMINE HCL 50 MG/5ML IJ SOSY
PREFILLED_SYRINGE | INTRAMUSCULAR | Status: AC
  Filled 2020-10-06: qty 5

## 2020-10-06 MED ORDER — ACETAMINOPHEN 160 MG/5ML PO SOLN: 325.0000 mg | ORAL | Status: DC | PRN

## 2020-10-06 MED ORDER — MAGNESIUM CITRATE PO SOLN: 1.0000 | Freq: Once | ORAL | Status: DC | PRN

## 2020-10-06 MED ORDER — FENTANYL CITRATE (PF) 100 MCG/2ML IJ SOLN
INTRAMUSCULAR | Status: AC
  Filled 2020-10-06: qty 2

## 2020-10-06 MED ORDER — MIDAZOLAM HCL 2 MG/2ML IJ SOLN
INTRAMUSCULAR | Status: AC
  Filled 2020-10-06: qty 2

## 2020-10-06 MED ORDER — SODIUM CHLORIDE 0.9 % IV SOLN: INTRAVENOUS | Status: DC

## 2020-10-06 MED ORDER — METHOCARBAMOL 500 MG PO TABS
500.0000 mg | ORAL_TABLET | Freq: Four times a day (QID) | ORAL | Status: DC | PRN
  Administered 2020-10-06 – 2020-10-14 (×9): 500 mg via ORAL
  Filled 2020-10-06 (×10): qty 1

## 2020-10-06 MED ORDER — ACETAMINOPHEN 10 MG/ML IV SOLN
INTRAVENOUS | Status: AC
  Administered 2020-10-06: 1000 mg
  Filled 2020-10-06: qty 100

## 2020-10-06 MED ORDER — CHLORHEXIDINE GLUCONATE 0.12 % MT SOLN
OROMUCOSAL | Status: AC
  Administered 2020-10-06: 15 mL via OROMUCOSAL
  Filled 2020-10-06: qty 15

## 2020-10-06 MED ORDER — PROPOFOL 10 MG/ML IV BOLUS
INTRAVENOUS | Status: DC | PRN
  Administered 2020-10-06: 100 mg via INTRAVENOUS

## 2020-10-06 MED ORDER — 0.9 % SODIUM CHLORIDE (POUR BTL) OPTIME
TOPICAL | Status: DC | PRN
  Administered 2020-10-06: 1000 mL

## 2020-10-06 MED ORDER — FENTANYL CITRATE (PF) 100 MCG/2ML IJ SOLN
25.0000 ug | INTRAMUSCULAR | Status: DC | PRN
  Administered 2020-10-06: 50 ug via INTRAVENOUS

## 2020-10-06 SURGICAL SUPPLY — 39 items
BLADE SAW RECIP 87.9 MT (BLADE) ×2 IMPLANT
BLADE SURG 21 STRL SS (BLADE) ×2 IMPLANT
BNDG COHESIVE 6X5 TAN STRL LF (GAUZE/BANDAGES/DRESSINGS) IMPLANT
CANISTER WOUND CARE 500ML ATS (WOUND CARE) ×2 IMPLANT
COVER SURGICAL LIGHT HANDLE (MISCELLANEOUS) ×2 IMPLANT
COVER WAND RF STERILE (DRAPES) IMPLANT
CUFF TOURN SGL QUICK 34 (TOURNIQUET CUFF)
CUFF TRNQT CYL 34X4.125X (TOURNIQUET CUFF) ×1 IMPLANT
DRAPE DERMATAC (DRAPES) ×4 IMPLANT
DRAPE INCISE IOBAN 66X45 STRL (DRAPES) ×2 IMPLANT
DRAPE U-SHAPE 47X51 STRL (DRAPES) ×2 IMPLANT
DRESSING PREVENA PLUS CUSTOM (GAUZE/BANDAGES/DRESSINGS) ×1 IMPLANT
DRSG PREVENA PLUS CUSTOM (GAUZE/BANDAGES/DRESSINGS) ×2
DURAPREP 26ML APPLICATOR (WOUND CARE) ×2 IMPLANT
ELECT REM PT RETURN 9FT ADLT (ELECTROSURGICAL) ×2
ELECTRODE REM PT RTRN 9FT ADLT (ELECTROSURGICAL) ×1 IMPLANT
GLOVE BIOGEL PI IND STRL 9 (GLOVE) ×1 IMPLANT
GLOVE BIOGEL PI INDICATOR 9 (GLOVE) ×1
GLOVE SURG ORTHO 9.0 STRL STRW (GLOVE) ×2 IMPLANT
GOWN STRL REUS W/ TWL XL LVL3 (GOWN DISPOSABLE) ×2 IMPLANT
GOWN STRL REUS W/TWL XL LVL3 (GOWN DISPOSABLE) ×4
KIT BASIN OR (CUSTOM PROCEDURE TRAY) ×2 IMPLANT
KIT TURNOVER KIT B (KITS) ×2 IMPLANT
MANIFOLD NEPTUNE II (INSTRUMENTS) ×2 IMPLANT
NS IRRIG 1000ML POUR BTL (IV SOLUTION) ×2 IMPLANT
PACK ORTHO EXTREMITY (CUSTOM PROCEDURE TRAY) ×2 IMPLANT
PAD ARMBOARD 7.5X6 YLW CONV (MISCELLANEOUS) ×2 IMPLANT
PREVENA RESTOR ARTHOFORM 46X30 (CANNISTER) ×2 IMPLANT
PREVENA RESTOR AXIOFORM 29X28 (GAUZE/BANDAGES/DRESSINGS) ×1 IMPLANT
SPONGE LAP 18X18 RF (DISPOSABLE) ×2 IMPLANT
STAPLER VISISTAT 35W (STAPLE) ×1 IMPLANT
STOCKINETTE IMPERVIOUS LG (DRAPES) ×1 IMPLANT
SUT ETHILON 2 0 PSLX (SUTURE) ×4 IMPLANT
SUT SILK 2 0 (SUTURE)
SUT SILK 2-0 18XBRD TIE 12 (SUTURE) ×1 IMPLANT
SUT VIC AB 1 CTX 27 (SUTURE) ×4 IMPLANT
TOWEL GREEN STERILE (TOWEL DISPOSABLE) ×2 IMPLANT
TUBE CONNECTING 12X1/4 (SUCTIONS) ×2 IMPLANT
YANKAUER SUCT BULB TIP NO VENT (SUCTIONS) ×2 IMPLANT

## 2020-10-06 NOTE — Anesthesia Preprocedure Evaluation (Addendum)
Anesthesia Evaluation  Patient identified by MRN, date of birth, ID band Patient awake    Reviewed: Allergy & Precautions, H&P , NPO status , Patient's Chart, lab work & pertinent test results, reviewed documented beta blocker date and time   Airway Mallampati: II  TM Distance: >3 FB Neck ROM: full    Dental no notable dental hx. (+) Poor Dentition, Chipped, Missing, Loose,    Pulmonary neg pulmonary ROS,    Pulmonary exam normal breath sounds clear to auscultation       Cardiovascular Exercise Tolerance: Good hypertension, Pt. on medications and Pt. on home beta blockers + DOE  + dysrhythmias Atrial Fibrillation  Rhythm:regular Rate:Normal     Neuro/Psych negative neurological ROS  negative psych ROS   GI/Hepatic Neg liver ROS, GERD  Medicated,  Endo/Other  negative endocrine ROSdiabetes, Insulin Dependent  Renal/GU negative Renal ROS  negative genitourinary   Musculoskeletal  (+) Arthritis , Osteoarthritis,  Fibromyalgia -  Abdominal   Peds  Hematology  (+) Blood dyscrasia, anemia ,   Anesthesia Other Findings   Reproductive/Obstetrics negative OB ROS                            Anesthesia Physical Anesthesia Plan  ASA: IV  Anesthesia Plan: General   Post-op Pain Management: GA combined w/ Regional for post-op pain   Induction:   PONV Risk Score and Plan: 3 and Ondansetron, Treatment may vary due to age or medical condition and Midazolam  Airway Management Planned: Oral ETT and LMA  Additional Equipment:   Intra-op Plan:   Post-operative Plan: Extubation in OR  Informed Consent: I have reviewed the patients History and Physical, chart, labs and discussed the procedure including the risks, benefits and alternatives for the proposed anesthesia with the patient or authorized representative who has indicated his/her understanding and acceptance.     Dental Advisory  Given  Plan Discussed with: CRNA and Anesthesiologist  Anesthesia Plan Comments: (Discussed both nerve block for pain relief post-op and GA; including NV, sore throat, dental injury, and pulmonary complications)        Anesthesia Quick Evaluation

## 2020-10-06 NOTE — Progress Notes (Signed)
PROGRESS NOTE  Yesenia Payne:[id number] DOB: Feb 27, 1951 DOA: 10/04/2020 PCP: Merri Brunette, MD  Brief History   Yesenia Payne is a 69 y.o. female with medical history significant for hypertension, iddm, hyperlipidemia presents to the ED for chief concern of left lower leg wound that started about two days ago.   She reports she only noticed the wound 2 days ago, although family states that it had been present for a couple of weeks.  She presented to the emergency department because the wound burst today.  She states the leg has been red and swollen for about 1-2 weeks. She denies presence of wounds to her knowledge prior to two days ago.  She denies trauma to the lower extremity.  ROS was negative for headache, vision changes, dysphagia, odynophagia, cough, chest pain, shortness of breath, abdominal pain, dysuria, hematuria, diarrhea, blood in her stool, constipation.  X-ray of the left lower extremity demonstrate gas in the dorsal and plantar aspects of the foot and heel.  [facility] were consulted to admit the patient for further evaluation and care.   The patient was started on IV Ancef and Clindamycin. She was transferred to [facility]. Dr. Lajoyce Corners has been consulted. He has evaluated the patient.   The patient underwent transtibial amputation on the morning of 10/06/2020. She tolerated the procedure well. Consultants  . Orthopedic surgery  Procedures  . Transtibial amputation of the left lower extremity.  Antibiotics   Anti-infectives (From admission, onward)   Start     Dose/Rate Route Frequency Ordered Stop   10/06/20 0600  ceFAZolin (ANCEF) 3 g in dextrose 5 % 50 mL IVPB  Status:  Discontinued        3 g 100 mL/hr over 30 Minutes Intravenous On call to O.R. 10/05/20 1040 10/06/20 1142   10/05/20 1200  vancomycin (VANCOCIN) IVPB 1000 mg/200 mL premix        1,000 mg 200 mL/hr over 60 Minutes Intravenous Every 12 hours 10/05/20 0022       10/05/20 1115  ceFAZolin (ANCEF) 3 g in dextrose 5 % 50 mL IVPB  Status:  Discontinued        3 g 100 mL/hr over 30 Minutes Intravenous On call to O.R. 10/05/20 1017 10/05/20 1040   10/05/20 0800  clindamycin (CLEOCIN) IVPB 600 mg        600 mg 100 mL/hr over 30 Minutes Intravenous Every 8 hours 10/04/20 2359     10/05/20 0600  piperacillin-tazobactam (ZOSYN) IVPB 3.375 g        3.375 g 12.5 mL/hr over 240 Minutes Intravenous Every 8 hours 10/05/20 0004     10/05/20 0000  piperacillin-tazobactam (ZOSYN) IVPB 3.375 g  Status:  Discontinued        3.375 g 100 mL/hr over 30 Minutes Intravenous Every 8 hours 10/04/20 2359 10/05/20 0003   10/04/20 2300  piperacillin-tazobactam (ZOSYN) IVPB 3.375 g        3.375 g 100 mL/hr over 30 Minutes Intravenous STAT 10/04/20 2246 10/04/20 2327   10/04/20 2300  vancomycin (VANCOREADY) IVPB 2000 mg/400 mL        2,000 mg 200 mL/hr over 120 Minutes Intravenous STAT 10/04/20 2246 10/05/20 0231   10/04/20 2300  clindamycin (CLEOCIN) IVPB 600 mg        600 mg 100 mL/hr over 30 Minutes Intravenous  Once 10/04/20 2249 10/05/20 0006     Subjective  The patient is resting comfortably. No new complaints.  Objective  Vitals:  Vitals:   10/06/20 1456 10/06/20 1511  BP: 131/82 134/78  Pulse: 95 94  Resp: 19 19  Temp: 97.7 F (36.5 C) 97.7 F (36.5 C)  SpO2: 95% 97%   Exam:  Constitutional:  . The patient is awake, alert, and oriented x 3. No acute distress. Respiratory:  . No increased work of breathing. . No wheezes, rales, or rhonchi . No tactile fremitus Cardiovascular:  . Regular rate and rhythm . No murmurs, ectopy, or gallups. . No lateral PMI. No thrills. Abdomen:  . Abdomen is soft, non-tender, non-distended . No hernias, masses, or organomegaly . Normoactive bowel sounds.  Musculoskeletal:  . No cyanosis, clubbing, or edema . Left lower extremity is bandaged.  Skin:  . No rashes, lesions, ulcers . palpation of skin: no  induration or nodules Neurologic:  . CN 2-12 intact . Sensation all 4 extremities intact Psychiatric:  . Mental status o Mood, affect appropriate o Orientation to person, place, time  . judgment and insight appear intact  I have personally reviewed the following:   Today's Data  . Dispensing optician  . Blood cultures x 2: 1/2 has grown out GNR. Marland Kitchen Urine Culture: has had no growth.  Imaging  . XR of the left lower extremity/foot.  Scheduled Meds: . albuterol  1 puff Inhalation BID  . docusate sodium  100 mg Oral BID  . fluticasone furoate-vilanterol  1 puff Inhalation Daily  . insulin aspart  0-20 Units Subcutaneous TID WC  . insulin aspart  0-5 Units Subcutaneous QHS  . insulin glargine  50 Units Subcutaneous Daily  . oxyCODONE      . rosuvastatin  20 mg Oral Daily   Continuous Infusions: . sodium chloride 10 mL/hr at 10/05/20 2036  . sodium chloride    . clindamycin (CLEOCIN) IV 600 mg (10/06/20 4098)  . methocarbamol (ROBAXIN) IV    . piperacillin-tazobactam (ZOSYN)  IV 3.375 g (10/06/20 0525)  . vancomycin 1,000 mg (10/06/20 1108)    Active Problems:   Necrotizing cellulitis   Sepsis (HCC)   Cutaneous abscess of left foot   Anemia   Pressure injury of skin   LOS: 2 days   A & P  Necrotizing cellulitis/cutaneous abscess of the left foot: The patient is on IV Ancef and Clindamycin.  Dr. Lajoyce Corners has been consulted and will take the patient to surgery on 10/06/2020.  Gram negative bacteremia: 1/2 blood culture has grown out GNR. The patient is receiving IV ancef and clindamycin. Proteus mirabilis and GPC have been identified. Await susceptibilities. Echocardiogram is pending.  Septic shock: Resolved. Due to necrotizing cellulitis with hypotension, tachycardia, leukocytosis, and lactic acidosis. Improving with sepsis protocol and volume resuscitation. ] DM II: Blood sugars will be managed with Lantus 50 units sub Q daily and FSBS and SSI.   Anemia: Likely of  chronic disease and dilutional effect. Monitor and check FOBT.   Hypertension: Blood pressures are currently low. Amlodipine and labetalol will be held.  I have seen and examined this patient myself. I have spent 32 minutes in her evaluation and car.e  DVT Prophylaxis: Ted hose on unaffected limb CODE STATUS: Full Code Family Communication: Family at bedside Disposition:  Status is: Inpatient  Remains inpatient appropriate because:Inpatient level of care appropriate due to severity of illness  Dispo: The patient is from: Home              Anticipated d/c is to: TBD  Anticipated d/c date is: 2 days              Patient currently is not medically stable to d/c.  Shahan Starks, DO [facility] Direct contact: see [email or web address]  7PM-7AM contact night coverage as above 10/06/2020, 4:19 PM  LOS: 1 day

## 2020-10-06 NOTE — Op Note (Signed)
   Date of Surgery: 10/06/2020  INDICATIONS: Yesenia Payne is a 69 y.o.-year-old female who has abscess and necrotic tissue left foot and leg.  PREOPERATIVE DIAGNOSIS: abscess and necrotic fascia left foot and leg  POSTOPERATIVE DIAGNOSIS: Same.  PROCEDURE: Transtibial amputation Application of Prevena wound VAC  SURGEON: Lajoyce Corners, M.D.  ANESTHESIA:  general  IV FLUIDS AND URINE: See anesthesia.  ESTIMATED BLOOD LOSS: 300 mL.  COMPLICATIONS: None.  DESCRIPTION OF PROCEDURE: The patient was brought to the operating room and underwent a general anesthetic. After adequate levels of anesthesia were obtained patient's lower extremity was prepped using DuraPrep draped into a sterile field. A timeout was called. The foot was draped out of the sterile field with impervious stockinette. A transverse incision was made 11 cm distal to the tibial tubercle. This curved proximally and a large posterior flap was created. The tibia was transected 1 cm proximal to the skin incision. The fibula was transected just proximal to the tibial incision. The tibia was beveled anteriorly. A large posterior flap was created. The sciatic nerve was pulled cut and allowed to retract. The vascular bundles were suture ligated with 2-0 silk. The deep and superficial fascial layers were closed using #1 Vicryl. The skin was closed using staples and 2-0 nylon. The wound was covered with a Prevena wound VAC. There was a good suction fit.  Patient was extubated taken to the PACU in stable condition.   DISCHARGE PLANNING:  Antibiotic duration:as needed  Weightbearing: NWB left  Pain medication: opoid pathway  Dressing care/ Wound JEH:[id number] for 1 week  Discharge to: SNF  Continue transfusion of 2 units PRBC  Follow-up: In the office 1 week post operative.  Aldean Baker, MD [facility] 1:34 PM

## 2020-10-06 NOTE — Transfer of Care (Signed)
Immediate Anesthesia Transfer of Care Note  Patient: Yesenia Payne  Procedure(s) Performed: AMPUTATION BELOW KNEE (Left Knee)  Patient Location: PACU  Anesthesia Type:General  Level of Consciousness: awake, alert , oriented, patient cooperative and responds to stimulation  Airway & Oxygen Therapy: Patient Spontanous Breathing and Patient connected to nasal cannula oxygen  Post-op Assessment: Report given to RN and Post -op Vital signs reviewed and stable  Post vital signs: Reviewed and stable  Last Vitals:  Vitals Value Taken Time  BP 117/78 10/06/20 1340  Temp    Pulse 90 10/06/20 1343  Resp 25 10/06/20 1343  SpO2 99 % 10/06/20 1343  Vitals shown include unvalidated device data.  Last Pain:  Vitals:   10/06/20 1059  TempSrc:   PainSc: 2       Patients Stated Pain Goal: 0 (10/05/20 0547)  Complications: No complications documented.

## 2020-10-06 NOTE — Progress Notes (Signed)
  PCCM will sign off as patient remains hemodynamically stable.  Please do not hesitate to call us back if we can be of any further assistance.     Posey Boyer, ACNP [facility] 10/06/2020, 7:27 AM

## 2020-10-06 NOTE — Interval H&P Note (Signed)
History and Physical Interval Note:  10/06/2020 11:42 AM  Yesenia Payne  has presented today for surgery, with the diagnosis of ABSCESS LEFT FOOT.  The various methods of treatment have been discussed with the patient and family. After consideration of risks, benefits and other options for treatment, the patient has consented to  Procedure(s): AMPUTATION BELOW KNEE (Left) as a surgical intervention.  The patient's history has been reviewed, patient examined, no change in status, stable for surgery.  I have reviewed the patient's chart and labs.  Questions were answered to the patient's satisfaction.     [name]

## 2020-10-06 NOTE — Anesthesia Postprocedure Evaluation (Signed)
Anesthesia Post Note  Patient: Yesenia Payne  Procedure(s) Performed: AMPUTATION BELOW KNEE (Left Knee)     Patient location during evaluation: PACU Anesthesia Type: General Level of consciousness: awake and alert Pain management: pain level controlled Vital Signs Assessment: post-procedure vital signs reviewed and stable Respiratory status: spontaneous breathing, nonlabored ventilation, respiratory function stable and patient connected to nasal cannula oxygen Cardiovascular status: blood pressure returned to baseline and stable Postop Assessment: no apparent nausea or vomiting Anesthetic complications: no   No complications documented.  Last Vitals:  Vitals:   10/06/20 1341 10/06/20 1355  BP:  123/75  Pulse:  95  Resp:  (!) 26  Temp: 37.1 C   SpO2:  97%    Last Pain:  Vitals:   10/06/20 1345  TempSrc: Oral  PainSc:                  Valora Norell

## 2020-10-07 ENCOUNTER — Encounter (HOSPITAL_COMMUNITY): Payer: Self-pay | Admitting: Orthopedic Surgery

## 2020-10-07 ENCOUNTER — Inpatient Hospital Stay (HOSPITAL_COMMUNITY): Payer: Medicare Other

## 2020-10-07 DIAGNOSIS — B955 Unspecified streptococcus as the cause of diseases classified elsewhere: Secondary | ICD-10-CM | POA: Diagnosis not present

## 2020-10-07 DIAGNOSIS — R7881 Bacteremia: Secondary | ICD-10-CM

## 2020-10-07 DIAGNOSIS — L02612 Cutaneous abscess of left foot: Secondary | ICD-10-CM | POA: Diagnosis not present

## 2020-10-07 DIAGNOSIS — E119 Type 2 diabetes mellitus without complications: Secondary | ICD-10-CM | POA: Diagnosis not present

## 2020-10-07 DIAGNOSIS — D649 Anemia, unspecified: Secondary | ICD-10-CM | POA: Diagnosis not present

## 2020-10-07 DIAGNOSIS — I361 Nonrheumatic tricuspid (valve) insufficiency: Secondary | ICD-10-CM

## 2020-10-07 DIAGNOSIS — L89624 Pressure ulcer of left heel, stage 4: Secondary | ICD-10-CM | POA: Diagnosis not present

## 2020-10-07 DIAGNOSIS — L0390 Cellulitis, unspecified: Secondary | ICD-10-CM | POA: Diagnosis not present

## 2020-10-07 LAB — GLUCOSE, CAPILLARY
Glucose-Capillary: 137 mg/dL — ABNORMAL HIGH (ref 70–99)
Glucose-Capillary: 189 mg/dL — ABNORMAL HIGH (ref 70–99)
Glucose-Capillary: 107 mg/dL — ABNORMAL HIGH (ref 70–99)
Glucose-Capillary: 98 mg/dL (ref 70–99)

## 2020-10-07 LAB — BPAM RBC
Blood Product Expiration Date: 202112292359
Blood Product Expiration Date: 202112292359
Blood Product Expiration Date: 202112302359
Blood Product Expiration Date: 202112302359
ISSUE DATE / TIME: 202111281128
ISSUE DATE / TIME: 202111281638
ISSUE DATE / TIME: 202111291226
ISSUE DATE / TIME: 202111291226
Unit Type and Rh: 5100
Unit Type and Rh: 5100
Unit Type and Rh: 5100
Unit Type and Rh: 5100

## 2020-10-07 LAB — ECHOCARDIOGRAM COMPLETE
Area-P 1/2: 4.04 cm2
Height: 63.25 in
MV M vel: 4.27 m/s
MV Peak grad: 72.9 mmHg
S' Lateral: 4.13 cm
Weight: 5008 oz

## 2020-10-07 LAB — COMPREHENSIVE METABOLIC PANEL
ALT: 26 U/L (ref 0–44)
AST: 41 U/L (ref 15–41)
Albumin: 1.5 g/dL — ABNORMAL LOW (ref 3.5–5.0)
Alkaline Phosphatase: 142 U/L — ABNORMAL HIGH (ref 38–126)
Anion gap: 14 (ref 5–15)
BUN: 17 mg/dL (ref 8–23)
CO2: 19 mmol/L — ABNORMAL LOW (ref 22–32)
Calcium: 7.9 mg/dL — ABNORMAL LOW (ref 8.9–10.3)
Chloride: 105 mmol/L (ref 98–111)
Creatinine, Ser: 0.97 mg/dL (ref 0.44–1.00)
GFR, Estimated: >60 mL/min (ref >60)
Glucose, Bld: 168 mg/dL — ABNORMAL HIGH (ref 70–99)
Potassium: 5.9 mmol/L — ABNORMAL HIGH (ref 3.5–5.1)
Sodium: 138 mmol/L (ref 135–145)
Total Bilirubin: 1.3 mg/dL — ABNORMAL HIGH (ref 0.3–1.2)
Total Protein: 6.5 g/dL (ref 6.5–8.1)

## 2020-10-07 LAB — CBC WITH DIFFERENTIAL/PLATELET
Abs Immature Granulocytes: 0.86 10*3/uL — ABNORMAL HIGH (ref 0.00–0.07)
Basophils Absolute: 0.1 10*3/uL (ref 0.0–0.1)
Basophils Relative: 1 %
Eosinophils Absolute: 0.4 10*3/uL (ref 0.0–0.5)
Eosinophils Relative: 2 %
HCT: 33.6 % — ABNORMAL LOW (ref 36.0–46.0)
Hemoglobin: 10.8 g/dL — ABNORMAL LOW (ref 12.0–15.0)
Immature Granulocytes: 5 %
Lymphocytes Relative: 10 %
Lymphs Abs: 1.8 10*3/uL (ref 0.7–4.0)
MCH: 27.6 pg (ref 26.0–34.0)
MCHC: 32.1 g/dL (ref 30.0–36.0)
MCV: 85.9 fL (ref 80.0–100.0)
Monocytes Absolute: 0.8 10*3/uL (ref 0.1–1.0)
Monocytes Relative: 4 %
Neutro Abs: 14.6 10*3/uL — ABNORMAL HIGH (ref 1.7–7.7)
Neutrophils Relative %: 78 %
Platelets: 342 10*3/uL (ref 150–400)
RBC: 3.91 MIL/uL (ref 3.87–5.11)
RDW: 15.9 % — ABNORMAL HIGH (ref 11.5–15.5)
WBC: 18.6 10*3/uL — ABNORMAL HIGH (ref 4.0–10.5)
nRBC: 0 % (ref 0.0–0.2)

## 2020-10-07 LAB — TYPE AND SCREEN
ABO/RH(D): O POS
Antibody Screen: NEGATIVE
Unit division: 0
Unit division: 0
Unit division: 0
Unit division: 0

## 2020-10-07 LAB — RETICULOCYTES
Immature Retic Fract: 15.8 % (ref 2.3–15.9)
RBC.: 3.76 MIL/uL — ABNORMAL LOW (ref 3.87–5.11)
Retic Count, Absolute: 50.4 10*3/uL (ref 19.0–186.0)
Retic Ct Pct: 1.3 % (ref 0.4–3.1)

## 2020-10-07 LAB — VITAMIN B12: Vitamin B-12: 845 pg/mL (ref 180–914)

## 2020-10-07 LAB — FOLATE: Folate: 15.5 ng/mL (ref >5.9)

## 2020-10-07 LAB — IRON AND TIBC
Iron: 32 ug/dL (ref 28–170)
Saturation Ratios: 21 % (ref 10.4–31.8)
TIBC: 150 ug/dL — ABNORMAL LOW (ref 250–450)
UIBC: 118 ug/dL

## 2020-10-07 LAB — FERRITIN: Ferritin: 396 ng/mL — ABNORMAL HIGH (ref 11–307)

## 2020-10-07 LAB — POTASSIUM: Potassium: 3.8 mmol/L (ref 3.5–5.1)

## 2020-10-07 MED ORDER — PERFLUTREN LIPID MICROSPHERE
1.0000 mL | INTRAVENOUS | Status: AC | PRN
  Administered 2020-10-07: 2 mL via INTRAVENOUS
  Filled 2020-10-07: qty 10

## 2020-10-07 MED ORDER — NON FORMULARY
20.0000 mg | Freq: Every day | Status: DC
Start: 2020-10-07 — End: 2020-10-07

## 2020-10-07 MED ORDER — OMEPRAZOLE 20 MG PO CPDR
20.0000 mg | DELAYED_RELEASE_CAPSULE | Freq: Every day | ORAL | Status: DC
  Administered 2020-10-07 – 2020-10-14 (×8): 20 mg via ORAL
  Filled 2020-10-07 (×9): qty 1

## 2020-10-07 MED ORDER — PANTOPRAZOLE SODIUM 40 MG PO TBEC
40.0000 mg | DELAYED_RELEASE_TABLET | Freq: Every day | ORAL | Status: DC
  Filled 2020-10-07: qty 1

## 2020-10-07 MED ORDER — SODIUM CHLORIDE 0.9 % IV SOLN
2.0000 g | INTRAVENOUS | Status: DC
  Administered 2020-10-07 – 2020-10-14 (×8): 2 g via INTRAVENOUS
  Filled 2020-10-07: qty 2
  Filled 2020-10-07: qty 20
  Filled 2020-10-07: qty 2
  Filled 2020-10-07: qty 20
  Filled 2020-10-07 (×5): qty 2

## 2020-10-07 NOTE — Progress Notes (Signed)
  Echocardiogram 2D Echocardiogram with definity has been performed.  Leta Jungling M 10/07/2020, 9:42 AM

## 2020-10-07 NOTE — Progress Notes (Signed)
Potassium level 5.9., no sign of distress noted, Chotiner MD paged will continue to monitor.

## 2020-10-07 NOTE — Consult Note (Signed)
[facility]    Date of Admission:  10/04/2020     Current antibiotics: Day 4 vancomycin (11/27-- present) Day 4 Pip tazo (11/27-- present) Day 4 clindamycin (11/27 -- present)  Previous antibiotics: N/A   Reason for Consult:  Bacteremia     Referring Physician: Dr Gerri Lins  ASSESSMENT:    # Strep constellatus and Proteus mirabilis bacteremia  Secondary to diabetic foot infection and necrotizing cellulitis s/p definitive source control with BKA 10/06/20.  TTE was negative for any vegetations.  # Diabetes A1c is 7.2  # Poor dentition Her dentition is in need of attention.  She reports she has struggled to take care of herself some since her son passed from cancer last year at age 27.  It has been hard on her and her husband (also their daughter), but she is hoping to take better care of herself going forward   PLAN:    -- narrow to ceftriaxone 2gm daily -- stop vancomycin, pip-tazo, and clindamycin -- repeat cultures have been ordered -- wound care, glycemic control -- will follow   MEDICATIONS:    Scheduled Meds: . albuterol  1 puff Inhalation BID  . docusate sodium  100 mg Oral BID  . fluticasone furoate-vilanterol  1 puff Inhalation Daily  . insulin aspart  0-20 Units Subcutaneous TID WC  . insulin aspart  0-5 Units Subcutaneous QHS  . insulin glargine  50 Units Subcutaneous Daily  . omeprazole  20 mg Oral Daily  . rosuvastatin  20 mg Oral Daily    Continuous Infusions: . sodium chloride 10 mL/hr at 10/06/20 1724  . clindamycin (CLEOCIN) IV 600 mg (10/07/20 0854)  . methocarbamol (ROBAXIN) IV    . piperacillin-tazobactam (ZOSYN)  IV 3.375 g (10/07/20 1429)  . vancomycin 1,000 mg (10/07/20 1134)    PRN Meds: acetaminophen, albuterol, bisacodyl, HYDROmorphone (DILAUDID) injection, magnesium citrate, methocarbamol **OR** methocarbamol (ROBAXIN) IV, metoCLOPramide **OR** metoCLOPramide (REGLAN) injection, ondansetron **OR**  ondansetron (ZOFRAN) IV, oxyCODONE, polyethylene glycol  HPI:    Yesenia Payne is a 69 y.o. female with a past medical history of insulin-dependent diabetes, hypertension, hyperlipidemia who presented November 27 with chief complaint of left lower extremity wound.  She reports that the wound developed rapidly several days prior to admission.  Initially she had redness and swelling for about 1 to 2 weeks and then subsequently developed a wound approximately 2 days prior to admission.  She denied any trauma to her lower extremity at that time.  She has dealt with diabetic foot ulcers in the past and contacted her primary care physician due to this concern.  She was not started on antibiotics at that time and had progression of her lower extremity changes prompting presentation to the ED.  In the ED, x-ray of the foot demonstrated soft tissue gas over the plantar and dorsal aspects of the foot consistent with cellulitis due to gas-forming organism.  She was found to be in A. fib with RVR.  She was started on vancomycin, Zosyn, clindamycin.  Orthopedics was consulted and she underwent transtibial amputation on November 29 with orthopedic surgery.    On admission her white blood cell count was 29.4.  Today it is improved to 18.6.  Serum creatinine on admission 1.09, mild LFT elevation.  Hgb A1c 7.2.  Today serum creatinine 0.97, LFTs have normalized.  We have been consulted for further antibiotic recommendations because her admission blood cultures are positive for Proteus mirabilis and Streptococcus constellatus in both sets  of admission blood cultures.  TTE today did not show any evidence of vegetations.   Past Medical History:  Diagnosis Date  . Acid reflux   . Asthma   . Chronic headache   . Diabetes mellitus without complication (HCC)   . Diverticular disease   . Diverticulitis   . Dyslipidemia   . Fibromyalgia   . Hyperlipidemia   . Hypertension   . IBS (irritable bowel syndrome)   .  Insomnia   . Irregular heartbeat   . Low back pain   . Morbid obesity (HCC)   . Neuropathy    distal peripheral  . Osteoarthritis   . PAC (premature atrial contraction) 01/18/2017  . Urinary incontinence   . Vitamin D deficiency     Social History   Tobacco Use  . Smoking status: Never Smoker  . Smokeless tobacco: Never Used  Substance Use Topics  . Alcohol use: No    Alcohol/week: 0.0 standard drinks  . Drug use: No    Family History  Problem Relation Age of Onset  . Hypertension Other   . Hypertension Mother   . Hyperlipidemia Mother   . Heart attack Father   . Diabetes Father   . CAD Father   . Neuropathy Father   . Diabetes Paternal Grandfather   . Cancer - Other Sister        uterine cancer  . Mental retardation Brother   . Leukemia Sister   . Heart attack Sister   . Other Daughter        ruptured colon x2    Allergies  Allergen Reactions  . Iodinated Diagnostic Agents Hives    Other reaction(s): Hives   . Metrizamide Hives    Review of Systems  Constitutional: Positive for fever (prior to admission).  Respiratory: Negative for cough and shortness of breath.   Cardiovascular: Negative for chest pain.  Gastrointestinal: Negative.   Genitourinary: Negative.   Musculoskeletal: Positive for joint pain.  Skin: Negative for rash.       + left foot ulcer.    All other systems reviewed and are negative.  OBJECTIVE:   Blood pressure 135/67, pulse 80, temperature 97.7 F (36.5 C), temperature source Oral, resp. rate 16, height 5' 3.25" (1.607 m), weight (!) 142 kg, SpO2 96 %. Body mass index is 55.01 kg/m.  Physical Exam Constitutional:      General: She is not in acute distress.    Appearance: Normal appearance. She is obese.  HENT:     Head: Normocephalic and atraumatic.     Mouth/Throat:     Comments: Multiple dental caries.  Eyes:     Extraocular Movements: Extraocular movements intact.     Conjunctiva/sclera: Conjunctivae normal.    Pulmonary:     Effort: Pulmonary effort is normal. No respiratory distress.  Abdominal:     General: Abdomen is flat.     Palpations: Abdomen is soft.     Tenderness: There is no abdominal tenderness. There is no guarding or rebound.  Musculoskeletal:     Comments: S/p Left BKA. Right foot with compression stocking.   Neurological:     General: No focal deficit present.     Mental Status: She is alert and oriented to person, place, and time.  Psychiatric:        Mood and Affect: Mood normal.        Behavior: Behavior normal.     Lab Results & Microbiology Lab Results  Component Value Date   WBC  18.6 (H) 10/07/2020   HGB 10.8 (L) 10/07/2020   HCT 33.6 (L) 10/07/2020   MCV 85.9 10/07/2020   PLT 342 10/07/2020    Lab Results  Component Value Date   NA 138 10/07/2020   K 3.8 10/07/2020   CO2 19 (L) 10/07/2020   GLUCOSE 168 (H) 10/07/2020   BUN 17 10/07/2020   CREATININE 0.97 10/07/2020   CALCIUM 7.9 (L) 10/07/2020   GFRNONAA >60 10/07/2020   GFRAA 105 01/18/2017    Lab Results  Component Value Date   ALT 26 10/07/2020   AST 41 10/07/2020   ALKPHOS 142 (H) 10/07/2020   BILITOT 1.3 (H) 10/07/2020    C-Reactive Protein     Component Value Date/Time   CRP 26.7 (H) 10/04/2020 2217    Erythrocyte Sedimentation Rate     Component Value Date/Time   ESRSEDRATE 105 (H) 10/04/2020 2126      I have reviewed the micro and lab results in Epic.  Imaging ECHOCARDIOGRAM COMPLETE  Result Date: 10/07/2020    ECHOCARDIOGRAM REPORT   Patient Name:   Yesenia Payne Date of Exam: 10/07/2020 Medical Rec #:  [id number]         Height:       63.3 in Accession #:    [id number]        Weight:       313.0 lb Date of Birth:  04-20-1951         BSA:          2.346 m Patient Age:    69 years          BP:           132/75 mmHg Patient Gender: F                 HR:           84 bpm. Exam Location:  Inpatient Procedure: 2D Echo, 3D Echo, Strain Analysis, Color Doppler, Cardiac Doppler  and            Intracardiac Opacification Agent Indications:    Bacteremia 790.7 / R78.81  History:        Patient has no prior history of Echocardiogram examinations.                 Risk Factors:Hypertension and Dyslipidemia. Transtibial                 amNecrotizing cellulitis                 Sepsisputation on the morning of 10/06/2020.  Sonographer:    Leta Jungling RDCS Referring Phys: [id number] AVA SWAYZE IMPRESSIONS  1. Left ventricular ejection fraction, by estimation, is 50 to 55%. The left ventricle has low normal function. The left ventricle demonstrates regional wall motion abnormalities (see scoring diagram/findings for description). There is mild left ventricular hypertrophy. Left ventricular diastolic function could not be evaluated. There is mild hypokinesis of the left ventricular, basal inferior wall. The average left ventricular global longitudinal strain is -10.3 %. The global longitudinal strain is abnormal.  2. Right ventricular systolic function is mildly reduced. The right ventricular size is normal. There is mildly elevated pulmonary artery systolic pressure.  3. A small pericardial effusion is present.  4. The mitral valve is grossly normal. Trivial mitral valve regurgitation.  5. The aortic valve is tricuspid. There is mild calcification of the aortic valve. Aortic valve regurgitation is not visualized. Mild aortic valve sclerosis is present, with no evidence  of aortic valve stenosis.  6. The inferior vena cava is dilated in size with <50% respiratory variability, suggesting right atrial pressure of 15 mmHg. Comparison(s): No prior Echocardiogram. Conclusion(s)/Recommendation(s): No evidence of valvular vegetations on this transthoracic echocardiogram. Would recommend a transesophageal echocardiogram to exclude infective endocarditis if clinically indicated. FINDINGS  Left Ventricle: Left ventricular ejection fraction, by estimation, is 50 to 55%. The left ventricle has low normal function.  The left ventricle demonstrates regional wall motion abnormalities. Mild hypokinesis of the left ventricular, basal inferior wall. Definity contrast agent was given IV to delineate the left ventricular endocardial borders. The average left ventricular global longitudinal strain is -10.3 %. The global longitudinal strain is abnormal. The left ventricular internal cavity size was normal in size. There is mild left ventricular hypertrophy. Left ventricular diastolic function could not be evaluated due to atrial fibrillation. Left ventricular diastolic function could not be evaluated. Right Ventricle: The right ventricular size is normal. No increase in right ventricular wall thickness. Right ventricular systolic function is mildly reduced. There is mildly elevated pulmonary artery systolic pressure. The tricuspid regurgitant velocity  is 2.35 m/s, and with an assumed right atrial pressure of 15 mmHg, the estimated right ventricular systolic pressure is 37.1 mmHg. Left Atrium: Left atrial size was normal in size. Right Atrium: Right atrial size was normal in size. Pericardium: A small pericardial effusion is present. Mitral Valve: The mitral valve is grossly normal. There is mild calcification of the mitral valve leaflet(s). Mild mitral annular calcification. Trivial mitral valve regurgitation. Tricuspid Valve: The tricuspid valve is normal in structure. Tricuspid valve regurgitation is mild . No evidence of tricuspid stenosis. Aortic Valve: The aortic valve is tricuspid. There is mild calcification of the aortic valve. Aortic valve regurgitation is not visualized. Mild aortic valve sclerosis is present, with no evidence of aortic valve stenosis. Pulmonic Valve: The pulmonic valve was grossly normal. Pulmonic valve regurgitation is trivial. No evidence of pulmonic stenosis. Aorta: The aortic root and ascending aorta are structurally normal, with no evidence of dilitation. Venous: The inferior vena cava is dilated in  size with less than 50% respiratory variability, suggesting right atrial pressure of 15 mmHg. IAS/Shunts: The atrial septum is grossly normal.  LEFT VENTRICLE PLAX 2D LVIDd:         5.10 cm  Diastology LVIDs:         4.13 cm  LV e' medial:    9.28 cm/s LV PW:         1.20 cm  LV E/e' medial:  13.0 LV IVS:        0.97 cm  LV e' lateral:   8.81 cm/s LVOT diam:     1.80 cm  LV E/e' lateral: 13.7 LV SV:         44 LV SV Index:   19       2D Longitudinal Strain LVOT Area:     2.54 cm 2D Strain GLS (A2C):   -8.2 %                         2D Strain GLS (A3C):   -12.7 %                         2D Strain GLS (A4C):   -10.1 %                         2D Strain GLS  Avg:     -10.3 % RIGHT VENTRICLE RV S prime:     11.05 cm/s TAPSE (M-mode): 1.3 cm LEFT ATRIUM             Index       RIGHT ATRIUM           Index LA diam:        4.10 cm 1.75 cm/m  RA Area:     17.90 cm LA Vol (A2C):   35.1 ml 14.96 ml/m RA Volume:   43.00 ml  18.33 ml/m LA Vol (A4C):   32.7 ml 13.94 ml/m LA Biplane Vol: 33.9 ml 14.45 ml/m  AORTIC VALVE LVOT Vmax:   95.30 cm/s LVOT Vmean:  59.900 cm/s LVOT VTI:    0.172 m  AORTA Ao Root diam: 3.10 cm MITRAL VALVE                TRICUSPID VALVE MV Area (PHT): 4.04 cm     TR Peak grad:   22.1 mmHg MV Decel Time: 188 msec     TR Vmax:        235.00 cm/s MR Peak grad: 72.9 mmHg MR Mean grad: 53.0 mmHg     SHUNTS MR Vmax:      427.00 cm/s   Systemic VTI:  0.17 m MR Vmean:     344.0 cm/s    Systemic Diam: 1.80 cm MV E velocity: 120.67 cm/s Jodelle Red MD Electronically signed by Jodelle Red MD Signature Date/Time: 10/07/2020/1:16:22 PM    Final       [facility] Coffer [facility] Infectious [facility] [phone] pager 10/07/2020, 3:31 PM

## 2020-10-07 NOTE — Progress Notes (Addendum)
PROGRESS NOTE  AYDIA MAJ DTO:[id number] DOB: 08-29-1951 DOA: 10/04/2020 PCP: Yesenia Brunette, MD  Brief History   Yesenia Payne is a 69 y.o. female with medical history significant for hypertension, iddm, hyperlipidemia presents to the ED for chief concern of left lower leg wound that started about two days ago.    She reports she only noticed the wound 2 days ago, although family states that it had been present for a couple of weeks.  She presented to the emergency department because the wound burst today.   She states the leg has been red and swollen for about 1-2 weeks. She denies presence of wounds to her knowledge prior to two days ago.  She denies trauma to the lower extremity.   ROS was negative for headache, vision changes, dysphagia, odynophagia, cough, chest pain, shortness of breath, abdominal pain, dysuria, hematuria, diarrhea, blood in her stool, constipation.  X-ray of the left lower extremity demonstrate gas in the dorsal and plantar aspects of the foot and heel.  [facility] were consulted to admit the patient for further evaluation and care.   The patient was started on IV Ancef and Clindamycin. She was transferred to [facility]. Dr. Lajoyce Payne has been consulted. He has evaluated the patient.   The patient underwent transtibial amputation on the morning of 10/06/2020. She tolerated the procedure well.  The patient has had 2/2 blood cultures grow out Strep species and p. Mirabilis. She had been receiving cefazolin and vancomycin from admission. Echocardiogram was ordered and did not demonstrate vegetations. Repeat blood cultures have been ordered. Infectious disease was consulted. Dr. Earlene Payne narrowed her antibiotics to Ceftriaxone 2 gm IV daily. Consultants  Orthopedic surgery Infectious disease  Procedures  Transtibial amputation of the left lower extremity.  Antibiotics   Anti-infectives (From admission, onward)    Start      Dose/Rate Route Frequency Ordered Stop   10/07/20 1645  cefTRIAXone (ROCEPHIN) 2 g in sodium chloride 0.9 % 100 mL IVPB        2 g 200 mL/hr over 30 Minutes Intravenous Every 24 hours 10/07/20 1552     10/06/20 0600  ceFAZolin (ANCEF) 3 g in dextrose 5 % 50 mL IVPB  Status:  Discontinued        3 g 100 mL/hr over 30 Minutes Intravenous On call to O.R. 10/05/20 1040 10/06/20 1142   10/05/20 1200  vancomycin (VANCOCIN) IVPB 1000 mg/200 mL premix  Status:  Discontinued        1,000 mg 200 mL/hr over 60 Minutes Intravenous Every 12 hours 10/05/20 0022 10/07/20 1552   10/05/20 1115  ceFAZolin (ANCEF) 3 g in dextrose 5 % 50 mL IVPB  Status:  Discontinued        3 g 100 mL/hr over 30 Minutes Intravenous On call to O.R. 10/05/20 1017 10/05/20 1040   10/05/20 0800  clindamycin (CLEOCIN) IVPB 600 mg  Status:  Discontinued        600 mg 100 mL/hr over 30 Minutes Intravenous Every 8 hours 10/04/20 2359 10/07/20 1552   10/05/20 0600  piperacillin-tazobactam (ZOSYN) IVPB 3.375 g  Status:  Discontinued        3.375 g 12.5 mL/hr over 240 Minutes Intravenous Every 8 hours 10/05/20 0004 10/07/20 1552   10/05/20 0000  piperacillin-tazobactam (ZOSYN) IVPB 3.375 g  Status:  Discontinued        3.375 g 100 mL/hr over 30 Minutes Intravenous Every 8 hours 10/04/20 2359 10/05/20 0003   10/04/20  2300  piperacillin-tazobactam (ZOSYN) IVPB 3.375 g        3.375 g 100 mL/hr over 30 Minutes Intravenous STAT 10/04/20 2246 10/04/20 2327   10/04/20 2300  vancomycin (VANCOREADY) IVPB 2000 mg/400 mL        2,000 mg 200 mL/hr over 120 Minutes Intravenous STAT 10/04/20 2246 10/05/20 0231   10/04/20 2300  clindamycin (CLEOCIN) IVPB 600 mg        600 mg 100 mL/hr over 30 Minutes Intravenous  Once 10/04/20 2249 10/05/20 0006      Subjective  The patient is resting comfortably. No new complaints.  Objective   Vitals:  Vitals:   10/07/20 1200 10/07/20 1650  BP: 135/67 115/67  Pulse: 80 81  Resp: 16 14  Temp:  97.7  F (36.5 C)  SpO2: 96% 95%   Exam:  Constitutional:  The patient is awake, alert, and oriented x 3. No acute distress. Respiratory:  No increased work of breathing. No wheezes, rales, or rhonchi No tactile fremitus Cardiovascular:  Regular rate and rhythm No murmurs, ectopy, or gallups. No lateral PMI. No thrills. Abdomen:  Abdomen is soft, non-tender, non-distended No hernias, masses, or organomegaly Normoactive bowel sounds.  Musculoskeletal:  No cyanosis, clubbing, or edema Left lower extremity is bandaged.  Skin:  No rashes, lesions, ulcers palpation of skin: no induration or nodules Neurologic:  CN 2-12 intact Sensation all 4 extremities intact Psychiatric:  Mental status Mood, affect appropriate Orientation to person, place, time  judgment and insight appear intact  I have personally reviewed the following:   Today's Data  Vitals  Micro Data  Blood cultures x 2: 1/2 has grown out GNR. Urine Culture: has had no growth.  Imaging  XR of the left lower extremity/foot.  Echocardiogram: No vegetations seen.  Scheduled Meds:  albuterol  1 puff Inhalation BID   docusate sodium  100 mg Oral BID   fluticasone furoate-vilanterol  1 puff Inhalation Daily   insulin aspart  0-20 Units Subcutaneous TID WC   insulin aspart  0-5 Units Subcutaneous QHS   insulin glargine  50 Units Subcutaneous Daily   omeprazole  20 mg Oral Daily   rosuvastatin  20 mg Oral Daily   Continuous Infusions:  sodium chloride 10 mL/hr at 10/06/20 1724   cefTRIAXone (ROCEPHIN)  IV     methocarbamol (ROBAXIN) IV      Active Problems:   Necrotizing cellulitis   Sepsis (HCC)   Cutaneous abscess of left foot   Anemia   Pressure injury of skin   LOS: 3 days   A & P  Necrotizing cellulitis/cutaneous abscess of the left foot: The patient is on IV Ancef and Clindamycin.  Dr. Lajoyce Payne has been consulted and will take the patient to surgery on 10/06/2020.  Bacteremia: 1/2 blood culture has  grown out P. Mirabilis and strep species. The patient has been receiving IV ancef and clindamycin. Proteus mirabilis and GPC have been identified. Await susceptibilities. Echocardiogram performed and is negative for vegetations. Infectious disease has been consulted and the patient's antibiotics have been narrowed to ceftriaxone 2 gm IV daily.  Septic shock: Resolved. Due to necrotizing cellulitis with hypotension, tachycardia, leukocytosis, and lactic acidosis. Improving with sepsis protocol and volume resuscitation. ] DM II: Blood sugars will be managed with Lantus 50 units sub Q daily and FSBS and SSI.   Anemia: Likely of chronic disease and dilutional effect. Monitor and check FOBT.   Hypertension: Blood pressures are currently low. Amlodipine and labetalol will be  held.  Multiple pressure injuries:  1-2. Bruising noted over the posterior left thigh and left calf unclear etiology  3. Deep tissue &pressure injury right ischial tuberosity; 100% dark purple non blanchable tissue  4. Unstageable pressure injury sacrum; 50% pink/50% black  Pressure Injury POA: Yes  Measurement:  Right ischium: 11cm x 6cm x 0cm  Sacrum: 9cm x 5cm x 0.2cm"  I have seen and examined this patient myself. I have spent 38 minutes in her evaluation and care. More than 50% of this was spent in coordination of care.  DVT Prophylaxis: Ted hose on unaffected limb CODE STATUS: Full Code Family Communication: Daughter at bedside. Disposition:  Status is: Inpatient  Remains inpatient appropriate because:Inpatient level of care appropriate due to severity of illness  Dispo: The patient is from: Home              Anticipated d/c is to:  TBD              Anticipated d/c date is: 2 days              Patient currently is not medically stable to d/c.  Khole Branch, DO [facility] Direct contact: see [email or web address]  7PM-7AM contact night coverage as above 10/07/2020, 5:14 PM  LOS: 1 day

## 2020-10-07 NOTE — Evaluation (Signed)
Physical Therapy Evaluation Patient Details Name: Yesenia Payne MRN: [id number] DOB: 1951-05-27 Today's Date: 10/07/2020   History of Present Illness  69 y.o.-year-old female medical history significant for hypertension, iddm, hyperlipidemia presented to ED 11/27 when L heel abcess burst. Admitted for treatment of abscess and necrotic tissue left foot and leg s/p 11/29 L transtibial amputation.   Clinical Impression  PTA pt living with husband and daughter in single story home with 1 step to enter. Pt reports household ambulation with RW and community mobility with Scooter. Pt able to perform ADLs with increased time and effort and family provides iADLs. Pt is currently limited in safe mobility by acute on chronic pain in presence of decreased strength and ROM. Pt is modA for rolling and modAx2 for sidelying to sit and sit to supine. Although pt is hesitant, PT recommending SNF level rehab for progression of mobility to maximize eventual independence in her home environment. PT will continue to follow acutely.    Follow Up Recommendations SNF    Equipment Recommendations  Wheelchair (measurements PT);Wheelchair cushion (measurements PT);3in1 (PT) (will need bariatric )    Recommendations for Other Services OT consult     Precautions / Restrictions Precautions Precautions: Fall Precaution Comments: new L BKA  Restrictions Weight Bearing Restrictions: Yes LLE Weight Bearing: Non weight bearing      Mobility  Bed Mobility Overal bed mobility: Needs Assistance Bed Mobility: Rolling;Sit to Supine;Sidelying to Sit Rolling: Mod assist Sidelying to sit: Mod assist;+2 for physical assistance   Sit to supine: Max assist;+2 for physical assistance   General bed mobility comments: mod A for rolling to R for placement of bed pan     Transfers                 General transfer comment: deferred          Balance Overall balance assessment: Needs  assistance Sitting-balance support: Feet unsupported;Bilateral upper extremity supported Sitting balance-Leahy Scale: Poor Sitting balance - Comments: unable to get feet safely to floor on air bed                                     Pertinent Vitals/Pain Pain Assessment: 0-10 Pain Score: 8  Pain Location: back and L LE Pain Descriptors / Indicators: Burning;Aching;Sharp;Throbbing Pain Intervention(s): Limited activity within patient's tolerance;Monitored during session;Patient requesting pain meds-RN notified;RN gave pain meds during session;Repositioned    Home Living Family/patient expects to be discharged to:: Private residence Living Arrangements: Spouse/significant other;Children Available Help at Discharge: Family Type of Home: House Home Access: Stairs to enter   Secretary/administrator of Steps: 1 Home Layout: One level Home Equipment: Walker - 2 wheels;Grab bars - toilet;Electric scooter      Prior Function Level of Independence: Needs assistance   Gait / Transfers Assistance Needed: household ambulation with RW, scooter in community   ADL's / Homemaking Assistance Needed: with increased time and effort, daughter and husband provide iADLs           Extremity/Trunk Assessment   Upper Extremity Assessment Upper Extremity Assessment: Generalized weakness (ROM limited by habitus)    Lower Extremity Assessment Lower Extremity Assessment: RLE deficits/detail;LLE deficits/detail RLE Deficits / Details: ROM limited by habitus RLE Sensation: decreased light touch LLE Deficits / Details: ROM limited by body habitus, new L BKA LLE: Unable to fully assess due to pain       Communication  Communication: No difficulties  Cognition Arousal/Alertness: Awake/alert Behavior During Therapy: WFL for tasks assessed/performed Overall Cognitive Status: Within Functional Limits for tasks assessed                                         General Comments General comments (skin integrity, edema, etc.): L LE wound vac in place         Assessment/Plan    PT Assessment Patient needs continued PT services  PT Problem List Decreased strength;Decreased range of motion;Decreased activity tolerance;Decreased balance;Decreased mobility;Decreased coordination;Impaired sensation;Pain       PT Treatment Interventions DME instruction;Gait training;Functional mobility training;Therapeutic activities;Therapeutic exercise;Balance training;Cognitive remediation;Patient/family education    PT Goals (Current goals can be found in the Care Plan section)  Acute Rehab PT Goals Patient Stated Goal: feel better PT Goal Formulation: With patient/family Time For Goal Achievement: 10/21/20 Potential to Achieve Goals: Fair    Frequency Min 3X/week    AM-PAC PT "6 Clicks" Mobility  Outcome Measure Help needed turning from your back to your side while in a flat bed without using bedrails?: A Lot Help needed moving from lying on your back to sitting on the side of a flat bed without using bedrails?: Total Help needed moving to and from a bed to a chair (including a wheelchair)?: Total Help needed standing up from a chair using your arms (e.g., wheelchair or bedside chair)?: Total Help needed to walk in hospital room?: Total Help needed climbing 3-5 steps with a railing? : Total 6 Click Score: 7    End of Session   Activity Tolerance: Patient limited by pain Patient left: in bed;with call bell/phone within reach;with family/visitor present;with bed alarm set Nurse Communication: Mobility status PT Visit Diagnosis: Unsteadiness on feet (R26.81);Other abnormalities of gait and mobility (R26.89);Muscle weakness (generalized) (M62.81);Difficulty in walking, not elsewhere classified (R26.2);Pain Pain - Right/Left: Left Pain - part of body: Leg (back)    Time: 5170-0174 PT Time Calculation (min) (ACUTE ONLY): 26 min   Charges:   PT  Evaluation $PT Eval Moderate Complexity: 1 Mod PT Treatments $Therapeutic Activity: 8-22 mins        Kostas Marrow B. Beverely Risen PT, DPT Acute Rehabilitation Services Pager [phone] Office [phone]   [name] Fleet 10/07/2020, 12:32 PM

## 2020-10-08 ENCOUNTER — Telehealth: Payer: Self-pay

## 2020-10-08 DIAGNOSIS — L03119 Cellulitis of unspecified part of limb: Secondary | ICD-10-CM

## 2020-10-08 DIAGNOSIS — L02612 Cutaneous abscess of left foot: Secondary | ICD-10-CM | POA: Diagnosis not present

## 2020-10-08 DIAGNOSIS — B955 Unspecified streptococcus as the cause of diseases classified elsewhere: Secondary | ICD-10-CM | POA: Diagnosis not present

## 2020-10-08 DIAGNOSIS — I4891 Unspecified atrial fibrillation: Secondary | ICD-10-CM

## 2020-10-08 DIAGNOSIS — B964 Proteus (mirabilis) (morganii) as the cause of diseases classified elsewhere: Secondary | ICD-10-CM

## 2020-10-08 DIAGNOSIS — A419 Sepsis, unspecified organism: Secondary | ICD-10-CM | POA: Diagnosis not present

## 2020-10-08 DIAGNOSIS — R7881 Bacteremia: Secondary | ICD-10-CM | POA: Diagnosis not present

## 2020-10-08 LAB — CBC WITH DIFFERENTIAL/PLATELET
Abs Immature Granulocytes: 0.53 10*3/uL — ABNORMAL HIGH (ref 0.00–0.07)
Basophils Absolute: 0.1 10*3/uL (ref 0.0–0.1)
Basophils Relative: 1 %
Eosinophils Absolute: 0.3 10*3/uL (ref 0.0–0.5)
Eosinophils Relative: 2 %
HCT: 33 % — ABNORMAL LOW (ref 36.0–46.0)
Hemoglobin: 10.6 g/dL — ABNORMAL LOW (ref 12.0–15.0)
Immature Granulocytes: 4 %
Lymphocytes Relative: 9 %
Lymphs Abs: 1.2 10*3/uL (ref 0.7–4.0)
MCH: 27.7 pg (ref 26.0–34.0)
MCHC: 32.1 g/dL (ref 30.0–36.0)
MCV: 86.2 fL (ref 80.0–100.0)
Monocytes Absolute: 0.5 10*3/uL (ref 0.1–1.0)
Monocytes Relative: 4 %
Neutro Abs: 10.5 10*3/uL — ABNORMAL HIGH (ref 1.7–7.7)
Neutrophils Relative %: 80 %
Platelets: 293 10*3/uL (ref 150–400)
RBC: 3.83 MIL/uL — ABNORMAL LOW (ref 3.87–5.11)
RDW: 15.9 % — ABNORMAL HIGH (ref 11.5–15.5)
WBC: 13.1 10*3/uL — ABNORMAL HIGH (ref 4.0–10.5)
nRBC: 0 % (ref 0.0–0.2)

## 2020-10-08 LAB — GLUCOSE, CAPILLARY
Glucose-Capillary: 78 mg/dL (ref 70–99)
Glucose-Capillary: 91 mg/dL (ref 70–99)
Glucose-Capillary: 129 mg/dL — ABNORMAL HIGH (ref 70–99)
Glucose-Capillary: 145 mg/dL — ABNORMAL HIGH (ref 70–99)
Glucose-Capillary: 185 mg/dL — ABNORMAL HIGH (ref 70–99)

## 2020-10-08 LAB — COMPREHENSIVE METABOLIC PANEL
ALT: 23 U/L (ref 0–44)
AST: 31 U/L (ref 15–41)
Albumin: 1.5 g/dL — ABNORMAL LOW (ref 3.5–5.0)
Alkaline Phosphatase: 143 U/L — ABNORMAL HIGH (ref 38–126)
Anion gap: 11 (ref 5–15)
BUN: 10 mg/dL (ref 8–23)
CO2: 22 mmol/L (ref 22–32)
Calcium: 8 mg/dL — ABNORMAL LOW (ref 8.9–10.3)
Chloride: 104 mmol/L (ref 98–111)
Creatinine, Ser: 0.8 mg/dL (ref 0.44–1.00)
GFR, Estimated: >60 mL/min (ref >60)
Glucose, Bld: 97 mg/dL (ref 70–99)
Potassium: 4.3 mmol/L (ref 3.5–5.1)
Sodium: 137 mmol/L (ref 135–145)
Total Bilirubin: 0.6 mg/dL (ref 0.3–1.2)
Total Protein: 6.7 g/dL (ref 6.5–8.1)

## 2020-10-08 LAB — CREATININE, SERUM
Creatinine, Ser: 0.89 mg/dL (ref 0.44–1.00)
GFR, Estimated: >60 mL/min (ref >60)

## 2020-10-08 LAB — CULTURE, BLOOD (ROUTINE X 2): Special Requests: ADEQUATE

## 2020-10-08 LAB — MAGNESIUM: Magnesium: 1.9 mg/dL (ref 1.7–2.4)

## 2020-10-08 LAB — SURGICAL PATHOLOGY

## 2020-10-08 LAB — PHOSPHORUS: Phosphorus: 3.4 mg/dL (ref 2.5–4.6)

## 2020-10-08 MED ORDER — KETOROLAC TROMETHAMINE 15 MG/ML IJ SOLN
15.0000 mg | Freq: Four times a day (QID) | INTRAMUSCULAR | Status: AC
  Administered 2020-10-08 – 2020-10-13 (×20): 15 mg via INTRAVENOUS
  Filled 2020-10-08 (×21): qty 1

## 2020-10-08 MED ORDER — GABAPENTIN 300 MG PO CAPS
300.0000 mg | ORAL_CAPSULE | Freq: Three times a day (TID) | ORAL | Status: DC
  Administered 2020-10-08 – 2020-10-14 (×20): 300 mg via ORAL
  Filled 2020-10-08 (×20): qty 1

## 2020-10-08 MED ORDER — OXYCODONE-ACETAMINOPHEN 5-325 MG PO TABS
1.0000 | ORAL_TABLET | ORAL | Status: DC | PRN
  Administered 2020-10-08: 2 via ORAL
  Administered 2020-10-08: 1 via ORAL
  Administered 2020-10-09 – 2020-10-10 (×6): 2 via ORAL
  Administered 2020-10-13 – 2020-10-14 (×2): 1 via ORAL
  Filled 2020-10-08 (×2): qty 2
  Filled 2020-10-08 (×2): qty 1
  Filled 2020-10-08 (×6): qty 2

## 2020-10-08 NOTE — Telephone Encounter (Signed)
Can you please see below and call daughter.

## 2020-10-08 NOTE — Progress Notes (Signed)
Mobility Specialist: Progress Note   10/08/20 1532  Mobility  Activity Turned to left side;Turned to right side (Bed Exercises)  Level of Assistance +2 (takes two people)  Assistive Device None  Mobility Response Tolerated fair  Mobility performed by Mobility specialist  Bed Position Semi-fowlers  $Mobility charge 1 Mobility   Pre-Mobility: 97 HR, 92% SpO2 Post-Mobility: 99 HR, 94% SpO2  Pt performed bed exercises. Pt was modA for LE exercises and +2 assist for turning to R and L side. Pt was assisted by RN, NT, and myself for pericare post-mobility.   Larin Weissberg Mobility Specialist\

## 2020-10-08 NOTE — Telephone Encounter (Signed)
I spoke with the patient's daughter.  She was upset that it was promised that her mother would go up to 2 mg of Dilaudid every 2 hours if her breathing remained stable per Dr. Lajoyce Corners.  She also wants her mother to get Percocet rather than oxycodone.  She wants her to be able to get 1-2 Percocet every 4 hours.  I did change the Percocet order.  Will discuss with Dr. Lajoyce Corners whether it would be appropriate to go at that high of a dose of Dilaudid so frequently

## 2020-10-08 NOTE — Telephone Encounter (Signed)
Patient and daughter called in wanting to speak with dr duda about her mom breathing said its been fine . Wants her iv to be increased to 1 mg for every 2 hours. Wants her oral cream to be 10-325 6 xs a day . Says patient is having some pain here and there.

## 2020-10-08 NOTE — Care Management Important Message (Signed)
Important Message  Patient Details  Name: Yesenia Payne MRN: [id number] Date of Birth: 1951-05-22   Medicare Important Message Given:  Yes     [name] 10/08/2020, 12:13 PM

## 2020-10-08 NOTE — Progress Notes (Addendum)
PHARMACY CONSULT NOTE FOR:  OUTPATIENT  PARENTERAL ANTIBIOTIC THERAPY (OPAT)  Indication: Strep and proteus bacteremia Regimen: Ceftriaxone 2 gm IV Q 24 hours + Metronidazole 500 mg Q 8 hours PO  End date: 10/16/20  IV antibiotic discharge orders are pended. To discharging provider:  please sign these orders via discharge navigator,  Select New Orders & click on the button choice - Manage This Unsigned Work.     Thank you for allowing pharmacy to be a part of this patient's care.  Sharin Mons, PharmD, BCPS, BCIDP Infectious Diseases Clinical Pharmacist Phone: [phone] 10/08/2020, 12:35 PM

## 2020-10-08 NOTE — Progress Notes (Signed)
[facility] for Infectious Disease  Date of Admission:  10/04/2020           Current antibiotics: Day 2 ceftriaxone  Previous antibiotics: vancomycin (11/27-- 11/30) Pip tazo (11/27-- 11/30) clindamycin (11/27 -- 11/30)  Reason for visit: Follow up on bacteremia  Interval events: No acute events noted.  ASSESSMENT:    # Strep constellatus and Proteus mirabilis bacteremia  Secondary to diabetic foot infection and necrotizing cellulitis s/p definitive source control with BKA 10/06/20.  TTE was negative for any vegetations.  # Diabetes  PLAN:    -- continue ceftriaxone -- repeat cultures NGTD -- wound care, glycemic control -- would give 10 days of IV antibiotics from date of surgery.  End date 10/16/20 -- place PICC if cultures remain negative at 48 hours -- will sign off, but please call as needed   Diagnosis: Strep constellatus and Proteus mirabilis bacteremia 2/2 DFU   Allergies  Allergen Reactions  . Iodinated Diagnostic Agents Hives    Other reaction(s): Hives   . Metrizamide Hives    OPAT Orders Discharge antibiotics to be given via PICC line Discharge antibiotics: Ceftriaxone 2gm daily  Per pharmacy protocol  Duration: 10 days End Date: 10/16/20  [facility] Care Per Protocol:  Home health RN for IV administration and teaching; PICC line care and labs.    Labs weekly while on IV antibiotics: _x_ CBC with differential __ BMP _x_ CMP __ CRP __ ESR __ Vancomycin trough __ CK  _x_ Please pull PIC at completion of IV antibiotics __ Please leave PIC in place until doctor has seen patient or been notified  Fax weekly labs to [phone]  Clinic Follow Up Appt: N/A      MEDICATIONS:    Scheduled Meds: . albuterol  1 puff Inhalation BID  . docusate sodium  100 mg Oral BID  . fluticasone furoate-vilanterol  1 puff Inhalation Daily  . insulin aspart  0-20 Units Subcutaneous TID WC  . insulin aspart  0-5 Units Subcutaneous QHS  .  insulin glargine  50 Units Subcutaneous Daily  . omeprazole  20 mg Oral Daily  . rosuvastatin  20 mg Oral Daily    Continuous Infusions: . sodium chloride Stopped (10/07/20 1903)  . cefTRIAXone (ROCEPHIN)  IV Stopped (10/07/20 1808)  . methocarbamol (ROBAXIN) IV      PRN Meds: acetaminophen, albuterol, bisacodyl, HYDROmorphone (DILAUDID) injection, magnesium citrate, methocarbamol **OR** methocarbamol (ROBAXIN) IV, metoCLOPramide **OR** metoCLOPramide (REGLAN) injection, ondansetron **OR** ondansetron (ZOFRAN) IV, oxyCODONE, polyethylene glycol  SUBJECTIVE:   Doing okay this AM.  Had a lot of pain when being transferred, but slept well last night.  She hopes the antibiotics work.  She has had no fevers, chills, n/v/d.  Review of Systems: As noted above.  All other systems reviewed and are negative.   OBJECTIVE:   Allergies  Allergen Reactions  . Iodinated Diagnostic Agents Hives    Other reaction(s): Hives   . Metrizamide Hives    Blood pressure 122/67, pulse 97, temperature 97.9 F (36.6 C), temperature source Oral, resp. rate 18, height 5' 3.25" (1.607 m), weight (!) 139.3 kg, SpO2 95 %. Body mass index is 53.95 kg/m.  Physical Exam Constitutional:      General: She is not in acute distress.    Appearance: Normal appearance.  HENT:     Mouth/Throat:     Comments: Dentition is poor.  Eyes:     Extraocular Movements: Extraocular movements intact.     Conjunctiva/sclera:  Conjunctivae normal.  Pulmonary:     Effort: Pulmonary effort is normal. No respiratory distress.  Musculoskeletal:     Comments: S/p left BKA with wound vac  Skin:    General: Skin is warm and dry.  Neurological:     General: No focal deficit present.     Mental Status: She is alert and oriented to person, place, and time.  Psychiatric:        Mood and Affect: Mood normal.        Behavior: Behavior normal.       Lab Results & Microbiology Lab Results  Component Value Date   WBC 18.6  (H) 10/07/2020   HGB 10.8 (L) 10/07/2020   HCT 33.6 (L) 10/07/2020   MCV 85.9 10/07/2020   PLT 342 10/07/2020    Lab Results  Component Value Date   NA 137 10/08/2020   K 4.3 10/08/2020   CO2 22 10/08/2020   GLUCOSE 97 10/08/2020   BUN 10 10/08/2020   CREATININE 0.80 10/08/2020   CALCIUM 8.0 (L) 10/08/2020   GFRNONAA >60 10/08/2020   GFRAA 105 01/18/2017    Lab Results  Component Value Date   ALT 23 10/08/2020   AST 31 10/08/2020   ALKPHOS 143 (H) 10/08/2020   BILITOT 0.6 10/08/2020     I have reviewed the micro and lab results in Epic.  Imaging ECHOCARDIOGRAM COMPLETE  Result Date: 10/07/2020    ECHOCARDIOGRAM REPORT   Patient Name:   SIMAR POTHIER Date of Exam: 10/07/2020 Medical Rec #:  [id number]         Height:       63.3 in Accession #:    [id number]        Weight:       313.0 lb Date of Birth:  Dec 31, 1950         BSA:          2.346 m Patient Age:    69 years          BP:           132/75 mmHg Patient Gender: F                 HR:           84 bpm. Exam Location:  Inpatient Procedure: 2D Echo, 3D Echo, Strain Analysis, Color Doppler, Cardiac Doppler and            Intracardiac Opacification Agent Indications:    Bacteremia 790.7 / R78.81  History:        Patient has no prior history of Echocardiogram examinations.                 Risk Factors:Hypertension and Dyslipidemia. Transtibial                 amNecrotizing cellulitis                 Sepsisputation on the morning of 10/06/2020.  Sonographer:    Darlina Sicilian RDCS Referring Phys: [id number] AVA SWAYZE IMPRESSIONS  1. Left ventricular ejection fraction, by estimation, is 50 to 55%. The left ventricle has low normal function. The left ventricle demonstrates regional wall motion abnormalities (see scoring diagram/findings for description). There is mild left ventricular hypertrophy. Left ventricular diastolic function could not be evaluated. There is mild hypokinesis of the left ventricular, basal inferior wall. The average  left ventricular global longitudinal strain is -10.3 %. The global longitudinal strain is abnormal.  2. Right ventricular systolic  function is mildly reduced. The right ventricular size is normal. There is mildly elevated pulmonary artery systolic pressure.  3. A small pericardial effusion is present.  4. The mitral valve is grossly normal. Trivial mitral valve regurgitation.  5. The aortic valve is tricuspid. There is mild calcification of the aortic valve. Aortic valve regurgitation is not visualized. Mild aortic valve sclerosis is present, with no evidence of aortic valve stenosis.  6. The inferior vena cava is dilated in size with <50% respiratory variability, suggesting right atrial pressure of 15 mmHg. Comparison(s): No prior Echocardiogram. Conclusion(s)/Recommendation(s): No evidence of valvular vegetations on this transthoracic echocardiogram. Would recommend a transesophageal echocardiogram to exclude infective endocarditis if clinically indicated. FINDINGS  Left Ventricle: Left ventricular ejection fraction, by estimation, is 50 to 55%. The left ventricle has low normal function. The left ventricle demonstrates regional wall motion abnormalities. Mild hypokinesis of the left ventricular, basal inferior wall. Definity contrast agent was given IV to delineate the left ventricular endocardial borders. The average left ventricular global longitudinal strain is -10.3 %. The global longitudinal strain is abnormal. The left ventricular internal cavity size was normal in size. There is mild left ventricular hypertrophy. Left ventricular diastolic function could not be evaluated due to atrial fibrillation. Left ventricular diastolic function could not be evaluated. Right Ventricle: The right ventricular size is normal. No increase in right ventricular wall thickness. Right ventricular systolic function is mildly reduced. There is mildly elevated pulmonary artery systolic pressure. The tricuspid regurgitant  velocity  is 2.35 m/s, and with an assumed right atrial pressure of 15 mmHg, the estimated right ventricular systolic pressure is 35.6 mmHg. Left Atrium: Left atrial size was normal in size. Right Atrium: Right atrial size was normal in size. Pericardium: A small pericardial effusion is present. Mitral Valve: The mitral valve is grossly normal. There is mild calcification of the mitral valve leaflet(s). Mild mitral annular calcification. Trivial mitral valve regurgitation. Tricuspid Valve: The tricuspid valve is normal in structure. Tricuspid valve regurgitation is mild . No evidence of tricuspid stenosis. Aortic Valve: The aortic valve is tricuspid. There is mild calcification of the aortic valve. Aortic valve regurgitation is not visualized. Mild aortic valve sclerosis is present, with no evidence of aortic valve stenosis. Pulmonic Valve: The pulmonic valve was grossly normal. Pulmonic valve regurgitation is trivial. No evidence of pulmonic stenosis. Aorta: The aortic root and ascending aorta are structurally normal, with no evidence of dilitation. Venous: The inferior vena cava is dilated in size with less than 50% respiratory variability, suggesting right atrial pressure of 15 mmHg. IAS/Shunts: The atrial septum is grossly normal.  LEFT VENTRICLE PLAX 2D LVIDd:         5.10 cm  Diastology LVIDs:         4.13 cm  LV e' medial:    9.28 cm/s LV PW:         1.20 cm  LV E/e' medial:  13.0 LV IVS:        0.97 cm  LV e' lateral:   8.81 cm/s LVOT diam:     1.80 cm  LV E/e' lateral: 13.7 LV SV:         44 LV SV Index:   19       2D Longitudinal Strain LVOT Area:     2.54 cm 2D Strain GLS (A2C):   -8.2 %  2D Strain GLS (A3C):   -12.7 %                         2D Strain GLS (A4C):   -10.1 %                         2D Strain GLS Avg:     -10.3 % RIGHT VENTRICLE RV S prime:     11.05 cm/s TAPSE (M-mode): 1.3 cm LEFT ATRIUM             Index       RIGHT ATRIUM           Index LA diam:        4.10 cm  1.75 cm/m  RA Area:     17.90 cm LA Vol (A2C):   35.1 ml 14.96 ml/m RA Volume:   43.00 ml  18.33 ml/m LA Vol (A4C):   32.7 ml 13.94 ml/m LA Biplane Vol: 33.9 ml 14.45 ml/m  AORTIC VALVE LVOT Vmax:   95.30 cm/s LVOT Vmean:  59.900 cm/s LVOT VTI:    0.172 m  AORTA Ao Root diam: 3.10 cm MITRAL VALVE                TRICUSPID VALVE MV Area (PHT): 4.04 cm     TR Peak grad:   22.1 mmHg MV Decel Time: 188 msec     TR Vmax:        235.00 cm/s MR Peak grad: 72.9 mmHg MR Mean grad: 53.0 mmHg     SHUNTS MR Vmax:      427.00 cm/s   Systemic VTI:  0.17 m MR Vmean:     344.0 cm/s    Systemic Diam: 1.80 cm MV E velocity: 120.67 cm/s Buford Dresser MD Electronically signed by Buford Dresser MD Signature Date/Time: 10/07/2020/1:16:22 PM    Final        Rayn[facility] for Infectious [facility] [phone] pager 10/08/2020, 12:06 PM

## 2020-10-08 NOTE — Progress Notes (Signed)
Patient is postop day 2 status post left below-knee amputation.  She is lying in bed comfortable replies to questions appropriately.  Wound VAC is in place has 2 black x's  but no alarming.  Nurses did inform me that it did alarm overnight and they reinforced it and it seemed to work again.  Currently no drainage in canister  Ideally VAC would remain in place for 1 week however if it develops leaking would remove and begin dry dressing changes

## 2020-10-08 NOTE — Progress Notes (Signed)
PROGRESS NOTE    Yesenia Payne  GGE:[id number] DOB: 09-30-1951 DOA: 10/04/2020 PCP: Carol Ada, MD     Brief Narrative:  69 y.o.WF PMHx HTN, HLD, DM type II   Presents to the ED for chief concern of left lower leg wound that started about two days ago.  She reports she only noticed the wound 2 days ago, although family states that it had been present for a couple of weeks. She presented to the emergency department because the wound burst today.  She states the leg has been red and swollen for about 1-2 weeks. She denies presence of wounds to her knowledgeprior to twodays ago. She denies trauma to the lower extremity.  ROS was negative for headache, vision changes, dysphagia, odynophagia, cough, chest pain, shortness of breath, abdominal pain, dysuria, hematuria, diarrhea, blood in her stool, constipation.  X-ray of the left lower extremity demonstrate gas in the dorsal and plantar aspects of the foot and heel. Started on IV Ancef and Clindamycin. She was transferred to [facility]. Dr. Sharol Given has been consulted. He has evaluated the patient.  S/P transtibial amputation on the morning of 10/06/2020.  The patient has had 2/2 blood cultures grow out Strep species and p. Mirabilis. She had been receiving cefazolin and vancomycin from admission. Echocardiogram was ordered and did not demonstrate vegetations. Repeat blood cultures have been ordered. Infectious disease was consulted. Dr. Juleen China narrowed her antibiotics to Ceftriaxone 2 gm IV daily.   Subjective: Afebrile overnight.  S/p LEFT AKA with wound VAC in place.  A/O x4.  States pain currently 8/10 at LEFT surgical site describes as stabbing sometimes shooting.  Increase with movement   Assessment & Plan: Covid vaccination;   Active Problems:   Necrotizing cellulitis   Sepsis ([place])   Cutaneous abscess of left foot   Anemia   Pressure injury of skin   Morbidly obese (HCC)  Necrotizing  cellulitis/cutaneous abscess LEFT foot -S/p 11/29 transtibial amputation see results below -Per Dr. Sharol Given to orthopedic surgery care plan is as follows;  Weightbearing: NWB left  Pain medication: opoid pathway  Dressing care/ Wound YYT:[id number] for 1 week  Discharge to: SNF -Antibiotics per ID  Bacteremia positive Proteus Mirabella's/Strep species -Continue antibiotics per ID. -12/2 will contact ID to determine exact length of antibiotic requirement  Septic shock -On admission patient met criteria for septic shock temp> 38 C, HR> 90, RR> 20, lactic acid> 2 site of infection left lower leg -on admission seen by PCCM secondary to concern for need of vasopressors. -Resolved  DM type II uncontrolled with hyperglycemia -11/27 hemoglobin A1c= 7.2 -Lantus 50 units daily -Resistant SSI  Anemia unspecified -Occult blood pending -Anemia panel pending  HTN -Controlled without medication continue to monitor closely  Orbitally obese (BMI 53.95 kg/m) -After patient recovers would benefit possibly from bariatric surgery  Multiple pressure injuries:  1-2. Bruising noted over the posterior left thigh and left calf unclear etiology  3. Deep tissue &pressure injury right ischial tuberosity; 100% dark purple non blanchable tissue  4. Unstageable pressure injury sacrum; 50% pink/50% black  Pressure Injury POA: Yes  Measurement:  Right ischium: 11cm x 6cm x 0cm  Sacrum: 9cm x 5cm x 0.2cm" Pressure Injury 10/05/20 Stage 2 -  Partial thickness loss of dermis presenting as a shallow open injury with a red, pink wound bed without slough. multiple wounds on buttocks (Active)  10/05/20 0915  Location:   Location Orientation:   Staging: Stage 2 -  Partial thickness  loss of dermis presenting as a shallow open injury with a red, pink wound bed without slough.  Wound Description (Comments): multiple wounds on buttocks  Present on Admission: Yes       I have seen and examined this patient  myself. I have spent 38 minutes in her evaluation and care. More than 50% of this was spent in coordination of care.   DVT prophylaxis:  Code Status: Full Family Communication: 12/1 daughter at bedside discussed plan of care answered all questions Status is: Inpatient    Dispo: The patient is from: Home              Anticipated d/c is to: SNF              Anticipated d/c date is: Per surgery              Patient currently unstable      Consultants:  ID Orthopedic surgery   Procedures/Significant Events:  Xray Left Foot: Superficial ulceration in the plantar soft tissues over the calcaneus. Soft tissue gas over the plantar and dorsal aspects of the foot consistent with cellulitis due to gas-forming organism. No evidence of osteomyelitis.  11/29 LeftTranstibial amputation Application of Prevena wound VAC   I have personally reviewed and interpreted all radiology studies and my findings are as above.  VENTILATOR SETTINGS:    Cultures 10/05/20 Blood culture: 10/05/20 wound culture: 10/05/20 urine culture:  Antimicrobials: Anti-infectives (From admission, onward)   Start     Dose/Rate Route Frequency Ordered Stop   10/07/20 1645  cefTRIAXone (ROCEPHIN) 2 g in sodium chloride 0.9 % 100 mL IVPB        2 g 200 mL/hr over 30 Minutes Intravenous Every 24 hours 10/07/20 1552     10/06/20 0600  ceFAZolin (ANCEF) 3 g in dextrose 5 % 50 mL IVPB  Status:  Discontinued        3 g 100 mL/hr over 30 Minutes Intravenous On call to O.R. 10/05/20 1040 10/06/20 1142   10/05/20 1200  vancomycin (VANCOCIN) IVPB 1000 mg/200 mL premix  Status:  Discontinued        1,000 mg 200 mL/hr over 60 Minutes Intravenous Every 12 hours 10/05/20 0022 10/07/20 1552   10/05/20 1115  ceFAZolin (ANCEF) 3 g in dextrose 5 % 50 mL IVPB  Status:  Discontinued        3 g 100 mL/hr over 30 Minutes Intravenous On call to O.R. 10/05/20 1017 10/05/20 1040   10/05/20 0800  clindamycin (CLEOCIN) IVPB 600 mg   Status:  Discontinued        600 mg 100 mL/hr over 30 Minutes Intravenous Every 8 hours 10/04/20 2359 10/07/20 1552   10/05/20 0600  piperacillin-tazobactam (ZOSYN) IVPB 3.375 g  Status:  Discontinued        3.375 g 12.5 mL/hr over 240 Minutes Intravenous Every 8 hours 10/05/20 0004 10/07/20 1552   10/05/20 0000  piperacillin-tazobactam (ZOSYN) IVPB 3.375 g  Status:  Discontinued        3.375 g 100 mL/hr over 30 Minutes Intravenous Every 8 hours 10/04/20 2359 10/05/20 0003   10/04/20 2300  piperacillin-tazobactam (ZOSYN) IVPB 3.375 g        3.375 g 100 mL/hr over 30 Minutes Intravenous STAT 10/04/20 2246 10/04/20 2327   10/04/20 2300  vancomycin (VANCOREADY) IVPB 2000 mg/400 mL        2,000 mg 200 mL/hr over 120 Minutes Intravenous STAT 10/04/20 2246 10/05/20 0231   10/04/20 2300  clindamycin (CLEOCIN) IVPB 600 mg        600 mg 100 mL/hr over 30 Minutes Intravenous  Once 10/04/20 2249 10/05/20 0006       Devices    LINES / TUBES:  Wound VAC 7/29>>    Continuous Infusions: . sodium chloride Stopped (10/07/20 1903)  . cefTRIAXone (ROCEPHIN)  IV 2 g (10/08/20 1614)  . methocarbamol (ROBAXIN) IV       Objective: Vitals:   10/08/20 2023 10/08/20 2334 10/09/20 0535 10/09/20 0758  BP:  132/79 116/75   Pulse: 85 87 71 69  Resp: _0 (!) 21  Temp:  97.7 F (36.5 C) 97.9 F (36.6 C)   TempSrc:  Oral Oral   SpO2: 95% 93% 97% 94%  Weight:      Height:        Intake/Output Summary (Last 24 hours) at 10/09/2020 0820 Last data filed at 10/09/2020 0546 Gross per 24 hour  Intake 730 ml  Output 1200 ml  Net -470 ml   Filed Weights   10/04/20 2136 10/07/20 0552 10/08/20 0500  Weight: 124.7 kg (!) 142 kg (!) 139.3 kg    Examination:  General: A/O x4, No acute respiratory distress Eyes: negative scleral hemorrhage, negative anisocoria, negative icterus ENT: Negative Runny nose, negative gingival bleeding, Neck:  Negative scars, masses, torticollis, lymphadenopathy,  JVD Lungs: Clear to auscultation bilaterally without wheezes or crackles Cardiovascular: Regular rate and rhythm without murmur gallop or rub normal S1 and S2 Abdomen: MORBIDLY OBESE, negative abdominal pain, nondistended, positive soft, bowel sounds, no rebound, no ascites, no appreciable mass EXTREMITIES: left AKA with wound VAC in place Skin: Negative rashes, lesions, ulcers Psychiatric:  Negative depression, negative anxiety, negative fatigue, negative mania  Central nervous system:  Cranial nerves II through XII intact, tongue/uvula midline, all extremities muscle strength 5/5, sensation intact throughout, negative dysarthria, negative expressive aphasia, negative receptive aphasia.  .     Data Reviewed: Care during the described time interval was provided by me .  I have reviewed this patient's available data, including medical history, events of note, physical examination, and all test results as part of my evaluation.  CBC: Recent Labs  Lab 10/04/20 2220 10/05/20 0517 10/07/20 0139 10/08/20 1014 10/09/20 0252  WBC 29.4* 23.7* 18.6* 13.1* 11.1*  NEUTROABS 25.0*  --  14.6* 10.5* 7.8*  HGB 10.3* 8.0* 10.8* 10.6* 9.8*  HCT 34.1* 25.5* 33.6* 33.0* 32.0*  MCV 91.4 89.5 85.9 86.2 90.7  PLT 344 285 342 293 211   Basic Metabolic Panel: Recent Labs  Lab 10/04/20 2220 10/04/20 2220 10/05/20 0517 10/05/20 0517 10/06/20 0653 10/07/20 0139 10/07/20 0720 10/08/20 0121 10/08/20 1014 10/09/20 0252  NA 134*  --  136  --   --  138  --   --  137 135  K 3.9   < > 3.9  --   --  5.9* 3.8  --  4.3 4.1  CL 101  --  106  --   --  105  --   --  104 104  CO2 22  --  20*  --   --  19*  --   --  22 23  GLUCOSE 103*  --  93  --   --  168*  --   --  97 179*  BUN 28*  --  25*  --   --  17  --   --  10 10  CREATININE 1.09*   < > 0.99   < >  1.00 0.97  --  0.89 0.80 0.82  CALCIUM 8.2*  --  7.4*  --   --  7.9*  --   --  8.0* 7.5*  MG  --   --   --   --   --   --   --   --  1.9 1.9  PHOS  --    --   --   --   --   --   --   --  3.4 3.8   < > = values in this interval not displayed.   GFR: Estimated Creatinine Clearance: 88.2 mL/min (by C-G formula based on SCr of 0.82 mg/dL). Liver Function Tests: Recent Labs  Lab 10/04/20 2220 10/07/20 0139 10/08/20 1014 10/09/20 0252  AST 65* 41 31 21  ALT 35 _0 ALKPHOS 164* 142* 143* 111  BILITOT 1.0 1.3* 0.6 0.6  PROT 7.4 6.5 6.7 5.6*  ALBUMIN 2.0* 1.5* 1.5* 1.4*   No results for input(s): LIPASE, AMYLASE in the last 168 hours. No results for input(s): AMMONIA in the last 168 hours. Coagulation Profile: Recent Labs  Lab 10/04/20 2220  INR 1.3*   Cardiac Enzymes: No results for input(s): CKTOTAL, CKMB, CKMBINDEX, TROPONINI in the last 168 hours. BNP (last 3 results) No results for input(s): PROBNP in the last 8760 hours. HbA1C: No results for input(s): HGBA1C in the last 72 hours. CBG: Recent Labs  Lab 10/08/20 1011 10/08/20 1114 10/08/20 1551 10/08/20 2120 10/09/20 0631  GLUCAP 91 129* 145* 185* 131*   Lipid Profile: No results for input(s): CHOL, HDL, LDLCALC, TRIG, CHOLHDL, LDLDIRECT in the last 72 hours. Thyroid Function Tests: No results for input(s): TSH, T4TOTAL, FREET4, T3FREE, THYROIDAB in the last 72 hours. Anemia Panel: Recent Labs    10/07/20 1156  VITAMINB12 845  FOLATE 15.5  FERRITIN 396*  TIBC 150*  IRON 32  RETICCTPCT 1.3   Sepsis Labs: Recent Labs  Lab 10/04/20 2220 10/05/20 0016  LATICACIDVEN 2.6* 2.2*    Recent Results (from the past 240 hour(s))  Urine culture     Status: Abnormal   Collection Time: 10/04/20 10:20 PM   Specimen: In/Out Cath Urine  Result Value Ref Range Status   Specimen Description   Final    IN/OUT CATH URINE Performed at [facility], [place] [address], [place], [place] [address]    Special Requests   Final    NONE Performed at [facility], [place] [address] [place], [place] [address]    Culture MULTIPLE SPECIES  PRESENT, SUGGEST RECOLLECTION (A)  Final   Report Status 10/06/2020 FINAL  Final  Blood Culture (routine x 2)     Status: Abnormal (Preliminary result)   Collection Time: 10/04/20 10:20 PM   Specimen: BLOOD  Result Value Ref Range Status   Specimen Description   Final    BLOOD RIGHT ANTECUBITAL Performed at [place] [address], [place],  [address]    Special Requests   Final    BOTTLES DRAWN AEROBIC AND ANAEROBIC Blood Culture results may not be optimal due to an inadequate volume of blood received in culture bottles Performed at [place] [address] [place], [place] [address]    Culture  Setup Time   Final    GRAM NEGATIVE RODS ANAEROBIC BOTTLE ONLY GRAM POSITIVE COCCI IN PAIRS CRITICAL RESULT CALLED TO, READ BACK BY AND VERIFIED WITH: PHARMD J MILLEN [id number] AT [place] BY CM GRAM  POSITIVE COCCI AEROBIC BOTTLE ONLY    Culture (A)  Final    PROTEUS MIRABILIS STREPTOCOCCUS CONSTELLATUS CULTURE REINCUBATED FOR BETTER GROWTH Performed at [facility], [place] [address], [place], [place] [address]    Report Status PENDING  Incomplete   Organism ID, Bacteria PROTEUS MIRABILIS  Final   Organism ID, Bacteria STREPTOCOCCUS CONSTELLATUS  Final      Susceptibility   Streptococcus constellatus - MIC*    PENICILLIN 0.25 INTERMEDIATE Intermediate     CEFTRIAXONE 1 SENSITIVE Sensitive     ERYTHROMYCIN <=0.12 SENSITIVE Sensitive     LEVOFLOXACIN 0.5 SENSITIVE Sensitive     VANCOMYCIN 0.5 SENSITIVE Sensitive     * STREPTOCOCCUS CONSTELLATUS   Proteus mirabilis - MIC*    AMPICILLIN >=32 RESISTANT Resistant     CEFAZOLIN 8 SENSITIVE Sensitive     CEFEPIME <=0.12 SENSITIVE Sensitive     CEFTAZIDIME <=1 SENSITIVE Sensitive     CEFTRIAXONE <=0.25 SENSITIVE Sensitive     CIPROFLOXACIN 2 INTERMEDIATE Intermediate     GENTAMICIN <=1 SENSITIVE Sensitive     IMIPENEM 2 SENSITIVE Sensitive     TRIMETH/SULFA >=320 RESISTANT Resistant      AMPICILLIN/SULBACTAM 8 SENSITIVE Sensitive     PIP/TAZO <=4 SENSITIVE Sensitive     * PROTEUS MIRABILIS  Blood Culture ID Panel (Reflexed)     Status: Abnormal   Collection Time: 10/04/20 10:20 PM  Result Value Ref Range Status   Enterococcus faecalis NOT DETECTED NOT DETECTED Final   Enterococcus Faecium NOT DETECTED NOT DETECTED Final   Listeria monocytogenes NOT DETECTED NOT DETECTED Final   Staphylococcus species NOT DETECTED NOT DETECTED Final   Staphylococcus aureus (BCID) NOT DETECTED NOT DETECTED Final   Staphylococcus epidermidis NOT DETECTED NOT DETECTED Final   Staphylococcus lugdunensis NOT DETECTED NOT DETECTED Final   Streptococcus species DETECTED (A) NOT DETECTED Final    Comment: Not Enterococcus species, Streptococcus agalactiae, Streptococcus pyogenes, or Streptococcus pneumoniae. CRITICAL RESULT CALLED TO, READ BACK BY AND VERIFIED WITH: PHARMD J MILLEN [id number] ST 1545 BY CM    Streptococcus agalactiae NOT DETECTED NOT DETECTED Final   Streptococcus pneumoniae NOT DETECTED NOT DETECTED Final   Streptococcus pyogenes NOT DETECTED NOT DETECTED Final   A.calcoaceticus-baumannii NOT DETECTED NOT DETECTED Final   Bacteroides fragilis DETECTED (A) NOT DETECTED Final    Comment: CRITICAL RESULT CALLED TO, READ BACK BY AND VERIFIED WITH: PHARMD J MILLEN [id number] AT 1545 BY CM    Enterobacterales DETECTED (A) NOT DETECTED Final    Comment: Enterobacterales represent a large order of gram negative bacteria, not a single organism. CRITICAL RESULT CALLED TO, READ BACK BY AND VERIFIED WITH: PHARMD J MILLEN [id number] AT 1545 BY CM    Enterobacter cloacae complex NOT DETECTED NOT DETECTED Final   Escherichia coli NOT DETECTED NOT DETECTED Final   Klebsiella aerogenes NOT DETECTED NOT DETECTED Final   Klebsiella oxytoca NOT DETECTED NOT DETECTED Final   Klebsiella pneumoniae NOT DETECTED NOT DETECTED Final   Proteus species DETECTED (A) NOT DETECTED Final    Comment: CRITICAL  RESULT CALLED TO, READ BACK BY AND VERIFIED WITH: PHARMD J MILLEN [id number] AT 1545 BY CM    Salmonella species NOT DETECTED NOT DETECTED Final   Serratia marcescens NOT DETECTED NOT DETECTED Final   Haemophilus influenzae NOT DETECTED NOT DETECTED Final   Neisseria meningitidis NOT DETECTED NOT DETECTED Final   Pseudomonas aeruginosa NOT DETECTED NOT DETECTED Final   Stenotrophomonas maltophilia NOT DETECTED NOT DETECTED Final   Candida  albicans NOT DETECTED NOT DETECTED Final   Candida auris NOT DETECTED NOT DETECTED Final   Candida glabrata NOT DETECTED NOT DETECTED Final   Candida krusei NOT DETECTED NOT DETECTED Final   Candida parapsilosis NOT DETECTED NOT DETECTED Final   Candida tropicalis NOT DETECTED NOT DETECTED Final   Cryptococcus neoformans/gattii NOT DETECTED NOT DETECTED Final   CTX-M ESBL NOT DETECTED NOT DETECTED Final   Carbapenem resistance IMP NOT DETECTED NOT DETECTED Final   Carbapenem resistance KPC NOT DETECTED NOT DETECTED Final   Carbapenem resistance NDM NOT DETECTED NOT DETECTED Final   Carbapenem resist OXA 48 LIKE NOT DETECTED NOT DETECTED Final   Carbapenem resistance VIM NOT DETECTED NOT DETECTED Final    Comment: Performed at [facility], [address], [place], [place] [address]  Blood Culture (routine x 2)     Status: Abnormal   Collection Time: 10/04/20 10:21 PM   Specimen: BLOOD LEFT HAND  Result Value Ref Range Status   Specimen Description   Final    BLOOD LEFT HAND Performed at [place] [address] [place], [place] [address]    Special Requests   Final    BOTTLES DRAWN AEROBIC AND ANAEROBIC Blood Culture adequate volume Performed at [place] [address] [place], [place] [address]    Culture  Setup Time   Final    GRAM NEGATIVE RODS GRAM POSITIVE COCCI IN CHAINS ANAEROBIC BOTTLE ONLY CRITICAL VALUE NOTED.  VALUE IS CONSISTENT WITH PREVIOUSLY REPORTED AND CALLED VALUE. GRAM  POSITIVE COCCI IN CHAINS AEROBIC BOTTLE ONLY    Culture (A)  Final    PROTEUS MIRABILIS STREPTOCOCCUS CONSTELLATUS SUSCEPTIBILITIES PERFORMED ON PREVIOUS CULTURE WITHIN THE LAST 5 DAYS. Performed at [facility], [place] [address], [place], [place] [address]    Report Status 10/08/2020 FINAL  Final  Wound or Superficial Culture     Status: None (Preliminary result)   Collection Time: 10/04/20 10:30 PM   Specimen: Wound  Result Value Ref Range Status   Specimen Description WOUND  Final   Special Requests LEFT FOOT  Final   Gram Stain   Final    NO WBC SEEN MODERATE GRAM POSITIVE COCCI IN PAIRS MODERATE GRAM NEGATIVE RODS    Culture ABUNDANT PROTEUS MIRABILIS  Final   Report Status PENDING  Incomplete   Organism ID, Bacteria PROTEUS MIRABILIS  Final      Susceptibility   Proteus mirabilis - MIC*    AMPICILLIN >=32 RESISTANT Resistant     CEFAZOLIN 8 SENSITIVE Sensitive     CEFEPIME <=0.12 SENSITIVE Sensitive     CEFTAZIDIME <=1 SENSITIVE Sensitive     CEFTRIAXONE <=0.25 SENSITIVE Sensitive     CIPROFLOXACIN 2 INTERMEDIATE Intermediate     GENTAMICIN <=1 SENSITIVE Sensitive     IMIPENEM 2 SENSITIVE Sensitive     TRIMETH/SULFA >=320 RESISTANT Resistant     AMPICILLIN/SULBACTAM 4 SENSITIVE Sensitive     PIP/TAZO Value in next row Sensitive      <=4 SENSITIVEPerformed at [place] [address] [place], [place] [address]    * ABUNDANT PROTEUS MIRABILIS  Resp Panel by RT-PCR (Flu A&B, Covid) Foot, Left     Status: None   Collection Time: 10/04/20 11:06 PM   Specimen: Foot, Left; Nasopharyngeal(NP) swabs in vial transport medium  Result Value Ref Range Status   SARS Coronavirus 2 by RT PCR NEGATIVE NEGATIVE Final    Comment: (NOTE) SARS-CoV-2 target nucleic acids  are NOT DETECTED.  The SARS-CoV-2 RNA is generally detectable in upper respiratory specimens during the acute phase of infection. The lowest concentration of SARS-CoV-2 viral copies this assay can  detect is 138 copies/mL. A negative result does not preclude SARS-Cov-2 infection and should not be used as the sole basis for treatment or other patient management decisions. A negative result may occur with  improper specimen collection/handling, submission of specimen other than nasopharyngeal swab, presence of viral mutation(s) within the areas targeted by this assay, and inadequate number of viral copies(<138 copies/mL). A negative result must be combined with clinical observations, patient history, and epidemiological information. The expected result is Negative.  Fact Sheet for Patients:  EntrepreneurPulse.com.au  Fact Sheet for Healthcare Providers:  IncredibleEmployment.be  This test is no t yet approved or cleared by the [place] FDA and  has been authorized for detection and/or diagnosis of SARS-CoV-2 by FDA under an Emergency Use Authorization (EUA). This EUA will remain  in effect (meaning this test can be used) for the duration of the COVID-19 declaration under Section 564(b)(1) of the Act, 21 U.S.C.section 360bbb-3(b)(1), unless the authorization is terminated  or revoked sooner.       Influenza A by PCR NEGATIVE NEGATIVE Final   Influenza B by PCR NEGATIVE NEGATIVE Final    Comment: (NOTE) The Xpert Xpress SARS-CoV-2/FLU/RSV plus assay is intended as an aid in the diagnosis of influenza from Nasopharyngeal swab specimens and should not be used as a sole basis for treatment. Nasal washings and aspirates are unacceptable for Xpert Xpress SARS-CoV-2/FLU/RSV testing.  Fact Sheet for Patients: EntrepreneurPulse.com.au  Fact Sheet for Healthcare Providers: IncredibleEmployment.be  This test is not yet approved or cleared by the [place] FDA and has been authorized for detection and/or diagnosis of SARS-CoV-2 by FDA under an Emergency Use Authorization (EUA). This EUA will remain in  effect (meaning this test can be used) for the duration of the COVID-19 declaration under Section 564(b)(1) of the Act, 21 U.S.C. section 360bbb-3(b)(1), unless the authorization is terminated or revoked.  Performed at [facility], [place] [address], [place], [place] [address]   Surgical PCR screen     Status: None   Collection Time: 10/05/20  8:54 PM   Specimen: Nasal Mucosa; Nasal Swab  Result Value Ref Range Status   MRSA, PCR NEGATIVE NEGATIVE Final   Staphylococcus aureus NEGATIVE NEGATIVE Final    Comment: (NOTE) The Xpert SA Assay (FDA approved for NASAL specimens in patients 73 years of age and older), is one component of a comprehensive surveillance program. It is not intended to diagnose infection nor to guide or monitor treatment. Performed at [facility], [place] [address], [place], [place] [address]   Culture, blood (routine x 2)     Status: None (Preliminary result)   Collection Time: 10/07/20  3:45 PM   Specimen: BLOOD  Result Value Ref Range Status   Specimen Description BLOOD RIGHT ANTECUBITAL  Final   Special Requests   Final    BOTTLES DRAWN AEROBIC AND ANAEROBIC Blood Culture adequate volume   Culture   Final    NO GROWTH < 24 HOURS Performed at [facility], [place] [address], [place], [place] [address]    Report Status PENDING  Incomplete  Culture, blood (routine x 2)     Status: None (Preliminary result)   Collection Time: 10/07/20  3:54 PM   Specimen: BLOOD  Result Value Ref Range Status   Specimen Description BLOOD  RIGHT ANTECUBITAL  Final   Special Requests   Final    BOTTLES DRAWN AEROBIC ONLY Blood Culture adequate volume   Culture   Final    NO GROWTH < 24 HOURS Performed at [facility], [address], [place], [place] [address]    Report Status PENDING  Incomplete         Radiology Studies: ECHOCARDIOGRAM COMPLETE  Result Date: 10/07/2020    ECHOCARDIOGRAM REPORT   Patient Name:   EMMARY CULBREATH  Date of Exam: 10/07/2020 Medical Rec #:  [id number]         Height:       63.3 in Accession #:    [id number]        Weight:       313.0 lb Date of Birth:  February 04, 1951         BSA:          2.346 m Patient Age:    48 years          BP:           132/75 mmHg Patient Gender: F                 HR:           84 bpm. Exam Location:  Inpatient Procedure: 2D Echo, 3D Echo, Strain Analysis, Color Doppler, Cardiac Doppler and            Intracardiac Opacification Agent Indications:    Bacteremia 790.7 / R78.81  History:        Patient has no prior history of Echocardiogram examinations.                 Risk Factors:Hypertension and Dyslipidemia. Transtibial                 amNecrotizing cellulitis                 Sepsisputation on the morning of 10/06/2020.  Sonographer:    Darlina Sicilian RDCS Referring Phys: [id number] AVA SWAYZE IMPRESSIONS  1. Left ventricular ejection fraction, by estimation, is 50 to 55%. The left ventricle has low normal function. The left ventricle demonstrates regional wall motion abnormalities (see scoring diagram/findings for description). There is mild left ventricular hypertrophy. Left ventricular diastolic function could not be evaluated. There is mild hypokinesis of the left ventricular, basal inferior wall. The average left ventricular global longitudinal strain is -10.3 %. The global longitudinal strain is abnormal.  2. Right ventricular systolic function is mildly reduced. The right ventricular size is normal. There is mildly elevated pulmonary artery systolic pressure.  3. A small pericardial effusion is present.  4. The mitral valve is grossly normal. Trivial mitral valve regurgitation.  5. The aortic valve is tricuspid. There is mild calcification of the aortic valve. Aortic valve regurgitation is not visualized. Mild aortic valve sclerosis is present, with no evidence of aortic valve stenosis.  6. The inferior vena cava is dilated in size with <50% respiratory variability, suggesting right atrial  pressure of 15 mmHg. Comparison(s): No prior Echocardiogram. Conclusion(s)/Recommendation(s): No evidence of valvular vegetations on this transthoracic echocardiogram. Would recommend a transesophageal echocardiogram to exclude infective endocarditis if clinically indicated. FINDINGS  Left Ventricle: Left ventricular ejection fraction, by estimation, is 50 to 55%. The left ventricle has low normal function. The left ventricle demonstrates regional wall motion abnormalities. Mild hypokinesis of the left ventricular, basal inferior wall. Definity contrast agent was given IV to delineate the left ventricular endocardial  borders. The average left ventricular global longitudinal strain is -10.3 %. The global longitudinal strain is abnormal. The left ventricular internal cavity size was normal in size. There is mild left ventricular hypertrophy. Left ventricular diastolic function could not be evaluated due to atrial fibrillation. Left ventricular diastolic function could not be evaluated. Right Ventricle: The right ventricular size is normal. No increase in right ventricular wall thickness. Right ventricular systolic function is mildly reduced. There is mildly elevated pulmonary artery systolic pressure. The tricuspid regurgitant velocity  is 2.35 m/s, and with an assumed right atrial pressure of 15 mmHg, the estimated right ventricular systolic pressure is 59.1 mmHg. Left Atrium: Left atrial size was normal in size. Right Atrium: Right atrial size was normal in size. Pericardium: A small pericardial effusion is present. Mitral Valve: The mitral valve is grossly normal. There is mild calcification of the mitral valve leaflet(s). Mild mitral annular calcification. Trivial mitral valve regurgitation. Tricuspid Valve: The tricuspid valve is normal in structure. Tricuspid valve regurgitation is mild . No evidence of tricuspid stenosis. Aortic Valve: The aortic valve is tricuspid. There is mild calcification of the aortic  valve. Aortic valve regurgitation is not visualized. Mild aortic valve sclerosis is present, with no evidence of aortic valve stenosis. Pulmonic Valve: The pulmonic valve was grossly normal. Pulmonic valve regurgitation is trivial. No evidence of pulmonic stenosis. Aorta: The aortic root and ascending aorta are structurally normal, with no evidence of dilitation. Venous: The inferior vena cava is dilated in size with less than 50% respiratory variability, suggesting right atrial pressure of 15 mmHg. IAS/Shunts: The atrial septum is grossly normal.  LEFT VENTRICLE PLAX 2D LVIDd:         5.10 cm  Diastology LVIDs:         4.13 cm  LV e' medial:    9.28 cm/s LV PW:         1.20 cm  LV E/e' medial:  13.0 LV IVS:        0.97 cm  LV e' lateral:   8.81 cm/s LVOT diam:     1.80 cm  LV E/e' lateral: 13.7 LV SV:         44 LV SV Index:   19       2D Longitudinal Strain LVOT Area:     2.54 cm 2D Strain GLS (A2C):   -8.2 %                         2D Strain GLS (A3C):   -12.7 %                         2D Strain GLS (A4C):   -10.1 %                         2D Strain GLS Avg:     -10.3 % RIGHT VENTRICLE RV S prime:     11.05 cm/s TAPSE (M-mode): 1.3 cm LEFT ATRIUM             Index       RIGHT ATRIUM           Index LA diam:        4.10 cm 1.75 cm/m  RA Area:     17.90 cm LA Vol (A2C):   35.1 ml 14.96 ml/m RA Volume:   43.00 ml  18.33 ml/m LA Vol (A4C):   32.7  ml 13.94 ml/m LA Biplane Vol: 33.9 ml 14.45 ml/m  AORTIC VALVE LVOT Vmax:   95.30 cm/s LVOT Vmean:  59.900 cm/s LVOT VTI:    0.172 m  AORTA Ao Root diam: 3.10 cm MITRAL VALVE                TRICUSPID VALVE MV Area (PHT): 4.04 cm     TR Peak grad:   22.1 mmHg MV Decel Time: 188 msec     TR Vmax:        235.00 cm/s MR Peak grad: 72.9 mmHg MR Mean grad: 53.0 mmHg     SHUNTS MR Vmax:      427.00 cm/s   Systemic VTI:  0.17 m MR Vmean:     344.0 cm/s    Systemic Diam: 1.80 cm MV E velocity: 120.67 cm/s Buford Dresser MD Electronically signed by Buford Dresser  MD Signature Date/Time: 10/07/2020/1:16:22 PM    Final         Scheduled Meds: . albuterol  1 puff Inhalation BID  . docusate sodium  100 mg Oral BID  . fluticasone furoate-vilanterol  1 puff Inhalation Daily  . gabapentin  300 mg Oral TID  . insulin aspart  0-20 Units Subcutaneous TID WC  . insulin aspart  0-5 Units Subcutaneous QHS  . insulin glargine  50 Units Subcutaneous Daily  . ketorolac  15 mg Intravenous Q6H  . omeprazole  20 mg Oral Daily  . rosuvastatin  20 mg Oral Daily   Continuous Infusions: . sodium chloride Stopped (10/07/20 1903)  . cefTRIAXone (ROCEPHIN)  IV 2 g (10/08/20 1614)  . methocarbamol (ROBAXIN) IV       LOS: 5 days    Time spent:40 min    WOODS, Geraldo Docker, MD [facility] Pager [phone]  If 7PM-7AM, please contact night-coverage [email or web address] Password [email or web address] 10/09/2020, 8:20 AM

## 2020-10-09 DIAGNOSIS — I4891 Unspecified atrial fibrillation: Secondary | ICD-10-CM | POA: Diagnosis not present

## 2020-10-09 DIAGNOSIS — A419 Sepsis, unspecified organism: Secondary | ICD-10-CM | POA: Diagnosis not present

## 2020-10-09 DIAGNOSIS — L02612 Cutaneous abscess of left foot: Secondary | ICD-10-CM | POA: Diagnosis not present

## 2020-10-09 LAB — CULTURE, BLOOD (ROUTINE X 2)

## 2020-10-09 LAB — COMPREHENSIVE METABOLIC PANEL
ALT: 18 U/L (ref 0–44)
AST: 21 U/L (ref 15–41)
Albumin: 1.4 g/dL — ABNORMAL LOW (ref 3.5–5.0)
Alkaline Phosphatase: 111 U/L (ref 38–126)
Anion gap: 8 (ref 5–15)
BUN: 10 mg/dL (ref 8–23)
CO2: 23 mmol/L (ref 22–32)
Calcium: 7.5 mg/dL — ABNORMAL LOW (ref 8.9–10.3)
Chloride: 104 mmol/L (ref 98–111)
Creatinine, Ser: 0.82 mg/dL (ref 0.44–1.00)
GFR, Estimated: >60 mL/min (ref >60)
Glucose, Bld: 179 mg/dL — ABNORMAL HIGH (ref 70–99)
Potassium: 4.1 mmol/L (ref 3.5–5.1)
Sodium: 135 mmol/L (ref 135–145)
Total Bilirubin: 0.6 mg/dL (ref 0.3–1.2)
Total Protein: 5.6 g/dL — ABNORMAL LOW (ref 6.5–8.1)

## 2020-10-09 LAB — CBC WITH DIFFERENTIAL/PLATELET
Abs Immature Granulocytes: 0.56 10*3/uL — ABNORMAL HIGH (ref 0.00–0.07)
Basophils Absolute: 0.1 10*3/uL (ref 0.0–0.1)
Basophils Relative: 1 %
Eosinophils Absolute: 0.4 10*3/uL (ref 0.0–0.5)
Eosinophils Relative: 4 %
HCT: 32 % — ABNORMAL LOW (ref 36.0–46.0)
Hemoglobin: 9.8 g/dL — ABNORMAL LOW (ref 12.0–15.0)
Immature Granulocytes: 5 %
Lymphocytes Relative: 16 %
Lymphs Abs: 1.8 10*3/uL (ref 0.7–4.0)
MCH: 27.8 pg (ref 26.0–34.0)
MCHC: 30.6 g/dL (ref 30.0–36.0)
MCV: 90.7 fL (ref 80.0–100.0)
Monocytes Absolute: 0.5 10*3/uL (ref 0.1–1.0)
Monocytes Relative: 5 %
Neutro Abs: 7.8 10*3/uL — ABNORMAL HIGH (ref 1.7–7.7)
Neutrophils Relative %: 69 %
Platelets: 258 10*3/uL (ref 150–400)
RBC: 3.53 MIL/uL — ABNORMAL LOW (ref 3.87–5.11)
RDW: 15.9 % — ABNORMAL HIGH (ref 11.5–15.5)
WBC: 11.1 10*3/uL — ABNORMAL HIGH (ref 4.0–10.5)
nRBC: 0 % (ref 0.0–0.2)

## 2020-10-09 LAB — GLUCOSE, CAPILLARY
Glucose-Capillary: 131 mg/dL — ABNORMAL HIGH (ref 70–99)
Glucose-Capillary: 144 mg/dL — ABNORMAL HIGH (ref 70–99)
Glucose-Capillary: 124 mg/dL — ABNORMAL HIGH (ref 70–99)
Glucose-Capillary: 140 mg/dL — ABNORMAL HIGH (ref 70–99)

## 2020-10-09 LAB — IRON AND TIBC
Iron: 39 ug/dL (ref 28–170)
Saturation Ratios: 21 % (ref 10.4–31.8)
TIBC: 183 ug/dL — ABNORMAL LOW (ref 250–450)
UIBC: 144 ug/dL

## 2020-10-09 LAB — RETICULOCYTES
Immature Retic Fract: 22.3 % — ABNORMAL HIGH (ref 2.3–15.9)
RBC.: 4.12 MIL/uL (ref 3.87–5.11)
Retic Count, Absolute: 76.5 10*3/uL (ref 19.0–186.0)
Retic Ct Pct: 1.9 % (ref 0.4–3.1)

## 2020-10-09 LAB — VITAMIN B12: Vitamin B-12: 690 pg/mL (ref 180–914)

## 2020-10-09 LAB — FERRITIN: Ferritin: 227 ng/mL (ref 11–307)

## 2020-10-09 LAB — MAGNESIUM: Magnesium: 1.9 mg/dL (ref 1.7–2.4)

## 2020-10-09 LAB — FOLATE: Folate: 9.8 ng/mL (ref >5.9)

## 2020-10-09 LAB — PHOSPHORUS: Phosphorus: 3.8 mg/dL (ref 2.5–4.6)

## 2020-10-09 MED ORDER — METRONIDAZOLE 500 MG PO TABS
500.0000 mg | ORAL_TABLET | Freq: Three times a day (TID) | ORAL | Status: DC
  Administered 2020-10-09 – 2020-10-14 (×17): 500 mg via ORAL
  Filled 2020-10-09 (×17): qty 1

## 2020-10-09 NOTE — Progress Notes (Signed)
PROGRESS NOTE    Yesenia Payne  PFX:[id number] DOB: July 22, 1951 DOA: 10/04/2020 PCP: Carol Ada, MD     Brief Narrative:  69 y.o.WF PMHx HTN, HLD, DM type II   Presents to the ED for chief concern of left lower leg wound that started about two days ago.  She reports she only noticed the wound 2 days ago, although family states that it had been present for a couple of weeks. She presented to the emergency department because the wound burst today.  She states the leg has been red and swollen for about 1-2 weeks. She denies presence of wounds to her knowledgeprior to twodays ago. She denies trauma to the lower extremity.  ROS was negative for headache, vision changes, dysphagia, odynophagia, cough, chest pain, shortness of breath, abdominal pain, dysuria, hematuria, diarrhea, blood in her stool, constipation.  X-ray of the left lower extremity demonstrate gas in the dorsal and plantar aspects of the foot and heel. Started on IV Ancef and Clindamycin. She was transferred to [facility]. Dr. Sharol Given has been consulted. He has evaluated the patient.  S/P transtibial amputation on the morning of 10/06/2020.  The patient has had 2/2 blood cultures grow out Strep species and p. Mirabilis. She had been receiving cefazolin and vancomycin from admission. Echocardiogram was ordered and did not demonstrate vegetations. Repeat blood cultures have been ordered. Infectious disease was consulted. Dr. Juleen China narrowed her antibiotics to Ceftriaxone 2 gm IV daily.   Subjective: 12/2 afebrile overnight, s/p LEFT AKA with wound VAC in place.  Patient in good spirits.  States pain now controlled.  Per daughter and patient they do not want to go to SNF per surgery recommendation.  Daughter states she is a Marine scientist and used to work for Dr. Sharol Given.    Assessment & Plan: Covid vaccination;   Active Problems:   Necrotizing cellulitis   Sepsis ([place])   Cutaneous abscess of left  foot   Anemia   Pressure injury of skin   Morbidly obese (HCC)  Necrotizing cellulitis/cutaneous abscess LEFT foot -S/p 11/29 transtibial amputation see results below -Per Dr. Sharol Given to orthopedic surgery care plan is as follows;  Weightbearing: NWB left  Pain medication: opoid pathway  Dressing care/ Wound HGD:JMEQASTM for 1 week  Discharge to: SNF -Antibiotics per ID  Bacteremia positive Proteus Mirabella's/Strep species/Bacteroides fragilis/Enterobacterales -Continue antibiotics. 10 Days post surgery date. per Dr. Jule Ser ID. Marland Kitchen  Septic shock -On admission patient met criteria for septic shock temp> 38 C, HR> 90, RR> 20, lactic acid> 2 site of infection left lower leg -on admission seen by PCCM secondary to concern for need of vasopressors. -Resolved  DM type II uncontrolled with hyperglycemia -11/27 hemoglobin A1c= 7.2 -Lantus 50 units daily -Resistant SSI  Anemia unspecified -Occult blood pending -Anemia panel pending  HTN -Controlled without medication continue to monitor closely  Orbitally obese (BMI 53.95 kg/m) -After patient recovers would benefit possibly from bariatric surgery  Multiple pressure injuries:  1-2. Bruising noted over the posterior left thigh and left calf unclear etiology  3. Deep tissue &pressure injury right ischial tuberosity; 100% dark purple non blanchable tissue  4. Unstageable pressure injury sacrum; 50% pink/50% black  Pressure Injury POA: Yes  Measurement:  Right ischium: 11cm x 6cm x 0cm  Sacrum: 9cm x 5cm x 0.2cm" Pressure Injury 10/05/20 Stage 2 -  Partial thickness loss of dermis presenting as a shallow open injury with a red, pink wound bed without slough. multiple wounds on buttocks (  Active)  10/05/20 0915  Location:   Location Orientation:   Staging: Stage 2 -  Partial thickness loss of dermis presenting as a shallow open injury with a red, pink wound bed without slough.  Wound Description (Comments): multiple wounds  on buttocks  Present on Admission: Yes   Goals of care -12/2 discussed with patient and daughter per Dr. Jess Barters note he recommended SNF for patient however patient and daughter refusing SNF at this time.  I stated they needed to discuss this with Dr. Sharol Given.    I have seen and examined this patient myself. I have spent 38 minutes in her evaluation and care. More than 50% of this was spent in coordination of care.   DVT prophylaxis:  Code Status: Full Family Communication: 12/1 daughter at bedside discussed plan of care answered all questions Status is: Inpatient    Dispo: The patient is from: Home              Anticipated d/c is to: SNF              Anticipated d/c date is: Per surgery              Patient currently unstable      Consultants:  ID Orthopedic surgery   Procedures/Significant Events:  Xray Left Foot: Superficial ulceration in the plantar soft tissues over the calcaneus. Soft tissue gas over the plantar and dorsal aspects of the foot consistent with cellulitis due to gas-forming organism. No evidence of osteomyelitis.  11/29 LeftTranstibial amputation Application of Prevena wound VAC   I have personally reviewed and interpreted all radiology studies and my findings are as above.  VENTILATOR SETTINGS:    Cultures 10/05/20 Blood culture: 10/05/20 wound culture: 10/05/20 urine culture:   Antimicrobials: Anti-infectives (From admission, onward)   Start     Ordered Stop   10/09/20 1400  metroNIDAZOLE (FLAGYL) tablet 500 mg        10/09/20 1303     10/07/20 1645  cefTRIAXone (ROCEPHIN) 2 g in sodium chloride 0.9 % 100 mL IVPB        10/07/20 1552     10/06/20 0600  ceFAZolin (ANCEF) 3 g in dextrose 5 % 50 mL IVPB  Status:  Discontinued        10/05/20 1040 10/06/20 1142   10/05/20 1200  vancomycin (VANCOCIN) IVPB 1000 mg/200 mL premix  Status:  Discontinued        10/05/20 0022 10/07/20 1552   10/05/20 1115  ceFAZolin (ANCEF) 3 g in dextrose 5 % 50  mL IVPB  Status:  Discontinued        10/05/20 1017 10/05/20 1040   10/05/20 0800  clindamycin (CLEOCIN) IVPB 600 mg  Status:  Discontinued        10/04/20 2359 10/07/20 1552   10/05/20 0600  piperacillin-tazobactam (ZOSYN) IVPB 3.375 g  Status:  Discontinued        10/05/20 0004 10/07/20 1552   10/05/20 0000  piperacillin-tazobactam (ZOSYN) IVPB 3.375 g  Status:  Discontinued        10/04/20 2359 10/05/20 0003   10/04/20 2300  piperacillin-tazobactam (ZOSYN) IVPB 3.375 g        10/04/20 2246 10/04/20 2327   10/04/20 2300  vancomycin (VANCOREADY) IVPB 2000 mg/400 mL        10/04/20 2246 10/05/20 0231   10/04/20 2300  clindamycin (CLEOCIN) IVPB 600 mg        10/04/20 2249 10/05/20 0006  Devices    LINES / TUBES:  Wound VAC 7/29>>    Continuous Infusions: . sodium chloride Stopped (10/07/20 1903)  . cefTRIAXone (ROCEPHIN)  IV 2 g (10/08/20 1614)  . methocarbamol (ROBAXIN) IV       Objective: Vitals:   10/09/20 0535 10/09/20 0758 10/09/20 0800 10/09/20 1100  BP: 116/75  (!) 142/81 137/84  Pulse: 71 69 72 82  Resp: 16 (!) 21 20   Temp: 97.9 F (36.6 C)  97.6 F (36.4 C) (!) 97.4 F (36.3 C)  TempSrc: Oral  Oral Oral  SpO2: 97% 94% 98%   Weight:      Height:        Intake/Output Summary (Last 24 hours) at 10/09/2020 1346 Last data filed at 10/09/2020 0546 Gross per 24 hour  Intake 490 ml  Output 400 ml  Net 90 ml   Filed Weights   10/04/20 2136 10/07/20 0552 10/08/20 0500  Weight: 124.7 kg (!) 142 kg (!) 139.3 kg    Examination:  General: A/O x4, No acute respiratory distress Eyes: negative scleral hemorrhage, negative anisocoria, negative icterus ENT: Negative Runny nose, negative gingival bleeding, Neck:  Negative scars, masses, torticollis, lymphadenopathy, JVD Lungs: Clear to auscultation bilaterally without wheezes or crackles Cardiovascular: Regular rate and rhythm without murmur gallop or rub normal S1 and S2 Abdomen: MORBIDLY OBESE, negative  abdominal pain, nondistended, positive soft, bowel sounds, no rebound, no ascites, no appreciable mass EXTREMITIES: left AKA with wound VAC in place Skin: Negative rashes, lesions, ulcers Psychiatric:  Negative depression, negative anxiety, negative fatigue, negative mania  Central nervous system:  Cranial nerves II through XII intact, tongue/uvula midline, all extremities muscle strength 5/5, sensation intact throughout, negative dysarthria, negative expressive aphasia, negative receptive aphasia.  .     Data Reviewed: Care during the described time interval was provided by me .  I have reviewed this patient's available data, including medical history, events of note, physical examination, and all test results as part of my evaluation.  CBC: Recent Labs  Lab 10/04/20 2220 10/05/20 0517 10/07/20 0139 10/08/20 1014 10/09/20 0252  WBC 29.4* 23.7* 18.6* 13.1* 11.1*  NEUTROABS 25.0*  --  14.6* 10.5* 7.8*  HGB 10.3* 8.0* 10.8* 10.6* 9.8*  HCT 34.1* 25.5* 33.6* 33.0* 32.0*  MCV 91.4 89.5 85.9 86.2 90.7  PLT 344 285 342 293 683   Basic Metabolic Panel: Recent Labs  Lab 10/04/20 2220 10/04/20 2220 10/05/20 0517 10/05/20 0517 10/06/20 0653 10/07/20 0139 10/07/20 0720 10/08/20 0121 10/08/20 1014 10/09/20 0252  NA 134*  --  136  --   --  138  --   --  137 135  K 3.9   < > 3.9  --   --  5.9* 3.8  --  4.3 4.1  CL 101  --  106  --   --  105  --   --  104 104  CO2 22  --  20*  --   --  19*  --   --  22 23  GLUCOSE 103*  --  93  --   --  168*  --   --  97 179*  BUN 28*  --  25*  --   --  17  --   --  10 10  CREATININE 1.09*   < > 0.99   < > 1.00 0.97  --  0.89 0.80 0.82  CALCIUM 8.2*  --  7.4*  --   --  7.9*  --   --  8.0* 7.5*  MG  --   --   --   --   --   --   --   --  1.9 1.9  PHOS  --   --   --   --   --   --   --   --  3.4 3.8   < > = values in this interval not displayed.   GFR: Estimated Creatinine Clearance: 88.2 mL/min (by C-G formula based on SCr of 0.82 mg/dL). Liver  Function Tests: Recent Labs  Lab 10/04/20 2220 10/07/20 0139 10/08/20 1014 10/09/20 0252  AST 65* 41 31 21  ALT 35 26 23 18   ALKPHOS 164* 142* 143* 111  BILITOT 1.0 1.3* 0.6 0.6  PROT 7.4 6.5 6.7 5.6*  ALBUMIN 2.0* 1.5* 1.5* 1.4*   No results for input(s): LIPASE, AMYLASE in the last 168 hours. No results for input(s): AMMONIA in the last 168 hours. Coagulation Profile: Recent Labs  Lab 10/04/20 2220  INR 1.3*   Cardiac Enzymes: No results for input(s): CKTOTAL, CKMB, CKMBINDEX, TROPONINI in the last 168 hours. BNP (last 3 results) No results for input(s): PROBNP in the last 8760 hours. HbA1C: No results for input(s): HGBA1C in the last 72 hours. CBG: Recent Labs  Lab 10/08/20 1114 10/08/20 1551 10/08/20 2120 10/09/20 0631 10/09/20 1243  GLUCAP 129* 145* 185* 131* 144*   Lipid Profile: No results for input(s): CHOL, HDL, LDLCALC, TRIG, CHOLHDL, LDLDIRECT in the last 72 hours. Thyroid Function Tests: No results for input(s): TSH, T4TOTAL, FREET4, T3FREE, THYROIDAB in the last 72 hours. Anemia Panel: Recent Labs    10/07/20 1156 10/09/20 0840  VITAMINB12 845 690  FOLATE 15.5 9.8  FERRITIN 396* 227  TIBC 150* 183*  IRON 32 39  RETICCTPCT 1.3 1.9   Sepsis Labs: Recent Labs  Lab 10/04/20 2220 10/05/20 0016  LATICACIDVEN 2.6* 2.2*    Recent Results (from the past 240 hour(s))  Urine culture     Status: Abnormal   Collection Time: 10/04/20 10:20 PM   Specimen: In/Out Cath Urine  Result Value Ref Range Status   Specimen Description   Final    IN/OUT CATH URINE Performed at [facility], [place] [address], [name], [place] [address]    Special Requests   Final    NONE Performed at [facility], [place] [address], [place], [place] [address]    Culture MULTIPLE SPECIES PRESENT, SUGGEST RECOLLECTION (A)  Final   Report Status 10/06/2020 FINAL  Final  Blood Culture (routine x 2)     Status: Abnormal   Collection Time:  10/04/20 10:20 PM   Specimen: BLOOD  Result Value Ref Range Status   Specimen Description   Final    BLOOD RIGHT ANTECUBITAL Performed at [facility] [address] [place], [place] [address]    Special Requests   Final    BOTTLES DRAWN AEROBIC AND ANAEROBIC Blood Culture results may not be optimal due to an inadequate volume of blood received in culture bottles Performed at [facility] [address], [place], [place] [address]    Culture  Setup Time   Final    GRAM NEGATIVE RODS ANAEROBIC BOTTLE ONLY GRAM POSITIVE COCCI IN PAIRS CRITICAL RESULT CALLED TO, READ BACK BY AND VERIFIED WITH: PHARMD J MILLEN [id number] AT 1544 BY CM GRAM POSITIVE COCCI AEROBIC BOTTLE ONLY    Culture (A)  Final    PROTEUS MIRABILIS STREPTOCOCCUS CONSTELLATUS BACTEROIDES FRAGILIS BETA  LACTAMASE POSITIVE Performed at [facility], [place] [address] [place], [place] [address]    Report Status 10/09/2020 FINAL  Final   Organism ID, Bacteria PROTEUS MIRABILIS  Final   Organism ID, Bacteria STREPTOCOCCUS CONSTELLATUS  Final      Susceptibility   Streptococcus constellatus - MIC*    PENICILLIN 0.25 INTERMEDIATE Intermediate     CEFTRIAXONE 1 SENSITIVE Sensitive     ERYTHROMYCIN <=0.12 SENSITIVE Sensitive     LEVOFLOXACIN 0.5 SENSITIVE Sensitive     VANCOMYCIN 0.5 SENSITIVE Sensitive     * STREPTOCOCCUS CONSTELLATUS   Proteus mirabilis - MIC*    AMPICILLIN >=32 RESISTANT Resistant     CEFAZOLIN 8 SENSITIVE Sensitive     CEFEPIME <=0.12 SENSITIVE Sensitive     CEFTAZIDIME <=1 SENSITIVE Sensitive     CEFTRIAXONE <=0.25 SENSITIVE Sensitive     CIPROFLOXACIN 2 INTERMEDIATE Intermediate     GENTAMICIN <=1 SENSITIVE Sensitive     IMIPENEM 2 SENSITIVE Sensitive     TRIMETH/SULFA >=320 RESISTANT Resistant     AMPICILLIN/SULBACTAM 8 SENSITIVE Sensitive     PIP/TAZO <=4 SENSITIVE Sensitive     * PROTEUS MIRABILIS  Blood Culture ID Panel (Reflexed)     Status:  Abnormal   Collection Time: 10/04/20 10:20 PM  Result Value Ref Range Status   Enterococcus faecalis NOT DETECTED NOT DETECTED Final   Enterococcus Faecium NOT DETECTED NOT DETECTED Final   Listeria monocytogenes NOT DETECTED NOT DETECTED Final   Staphylococcus species NOT DETECTED NOT DETECTED Final   Staphylococcus aureus (BCID) NOT DETECTED NOT DETECTED Final   Staphylococcus epidermidis NOT DETECTED NOT DETECTED Final   Staphylococcus lugdunensis NOT DETECTED NOT DETECTED Final   Streptococcus species DETECTED (A) NOT DETECTED Final    Comment: Not Enterococcus species, Streptococcus agalactiae, Streptococcus pyogenes, or Streptococcus pneumoniae. CRITICAL RESULT CALLED TO, READ BACK BY AND VERIFIED WITH: PHARMD J MILLEN [id number] ST 1545 BY CM    Streptococcus agalactiae NOT DETECTED NOT DETECTED Final   Streptococcus pneumoniae NOT DETECTED NOT DETECTED Final   Streptococcus pyogenes NOT DETECTED NOT DETECTED Final   A.calcoaceticus-baumannii NOT DETECTED NOT DETECTED Final   Bacteroides fragilis DETECTED (A) NOT DETECTED Final    Comment: CRITICAL RESULT CALLED TO, READ BACK BY AND VERIFIED WITH: PHARMD J MILLEN [id number] AT 1545 BY CM    Enterobacterales DETECTED (A) NOT DETECTED Final    Comment: Enterobacterales represent a large order of gram negative bacteria, not a single organism. CRITICAL RESULT CALLED TO, READ BACK BY AND VERIFIED WITH: PHARMD J MILLEN [id number] AT 1545 BY CM    Enterobacter cloacae complex NOT DETECTED NOT DETECTED Final   Escherichia coli NOT DETECTED NOT DETECTED Final   Klebsiella aerogenes NOT DETECTED NOT DETECTED Final   Klebsiella oxytoca NOT DETECTED NOT DETECTED Final   Klebsiella pneumoniae NOT DETECTED NOT DETECTED Final   Proteus species DETECTED (A) NOT DETECTED Final    Comment: CRITICAL RESULT CALLED TO, READ BACK BY AND VERIFIED WITH: PHARMD J MILLEN [id number] AT 1545 BY CM    Salmonella species NOT DETECTED NOT DETECTED Final   Serratia  marcescens NOT DETECTED NOT DETECTED Final   Haemophilus influenzae NOT DETECTED NOT DETECTED Final   Neisseria meningitidis NOT DETECTED NOT DETECTED Final   Pseudomonas aeruginosa NOT DETECTED NOT DETECTED Final   Stenotrophomonas maltophilia NOT DETECTED NOT DETECTED Final   Candida albicans NOT DETECTED NOT DETECTED Final   Candida auris NOT DETECTED NOT DETECTED Final   Candida glabrata NOT DETECTED  NOT DETECTED Final   Candida krusei NOT DETECTED NOT DETECTED Final   Candida parapsilosis NOT DETECTED NOT DETECTED Final   Candida tropicalis NOT DETECTED NOT DETECTED Final   Cryptococcus neoformans/gattii NOT DETECTED NOT DETECTED Final   CTX-M ESBL NOT DETECTED NOT DETECTED Final   Carbapenem resistance IMP NOT DETECTED NOT DETECTED Final   Carbapenem resistance KPC NOT DETECTED NOT DETECTED Final   Carbapenem resistance NDM NOT DETECTED NOT DETECTED Final   Carbapenem resist OXA 48 LIKE NOT DETECTED NOT DETECTED Final   Carbapenem resistance VIM NOT DETECTED NOT DETECTED Final    Comment: Performed at [name], [place] [address], [place], [place] [address]  Blood Culture (routine x 2)     Status: Abnormal   Collection Time: 10/04/20 10:21 PM   Specimen: BLOOD LEFT HAND  Result Value Ref Range Status   Specimen Description   Final    BLOOD LEFT HAND Performed at [facility] [address], [place], [place] [address]    Special Requests   Final    BOTTLES DRAWN AEROBIC AND ANAEROBIC Blood Culture adequate volume Performed at [facility] [address], [place], [place] [address] [address]    Culture  Setup Time   Final    GRAM NEGATIVE RODS GRAM POSITIVE COCCI IN CHAINS ANAEROBIC BOTTLE ONLY CRITICAL VALUE NOTED.  VALUE IS CONSISTENT WITH PREVIOUSLY REPORTED AND CALLED VALUE. GRAM POSITIVE COCCI IN CHAINS AEROBIC BOTTLE ONLY    Culture (A)  Final    PROTEUS MIRABILIS STREPTOCOCCUS CONSTELLATUS SUSCEPTIBILITIES PERFORMED ON PREVIOUS  CULTURE WITHIN THE LAST 5 DAYS. Performed at [facility], [place] [address], [place], [place] [address]    Report Status 10/08/2020 FINAL  Final  Wound or Superficial Culture     Status: None (Preliminary result)   Collection Time: 10/04/20 10:30 PM   Specimen: Wound  Result Value Ref Range Status   Specimen Description WOUND  Final   Special Requests LEFT FOOT  Final   Gram Stain   Final    NO WBC SEEN MODERATE GRAM POSITIVE COCCI IN PAIRS MODERATE GRAM NEGATIVE RODS    Culture ABUNDANT PROTEUS MIRABILIS  Final   Report Status PENDING  Incomplete   Organism ID, Bacteria PROTEUS MIRABILIS  Final      Susceptibility   Proteus mirabilis - MIC*    AMPICILLIN >=32 RESISTANT Resistant     CEFAZOLIN 8 SENSITIVE Sensitive     CEFEPIME <=0.12 SENSITIVE Sensitive     CEFTAZIDIME <=1 SENSITIVE Sensitive     CEFTRIAXONE <=0.25 SENSITIVE Sensitive     CIPROFLOXACIN 2 INTERMEDIATE Intermediate     GENTAMICIN <=1 SENSITIVE Sensitive     IMIPENEM 2 SENSITIVE Sensitive     TRIMETH/SULFA >=320 RESISTANT Resistant     AMPICILLIN/SULBACTAM 4 SENSITIVE Sensitive     PIP/TAZO Value in next row Sensitive      <=4 SENSITIVEPerformed at [facility] [address] [place], [place] [address]    * ABUNDANT PROTEUS MIRABILIS  Resp Panel by RT-PCR (Flu A&B, Covid) Foot, Left     Status: None   Collection Time: 10/04/20 11:06 PM   Specimen: Foot, Left; Nasopharyngeal(NP) swabs in vial transport medium  Result Value Ref Range Status   SARS Coronavirus 2 by RT PCR NEGATIVE NEGATIVE Final    Comment: (NOTE) SARS-CoV-2 target nucleic acids are NOT DETECTED.  The SARS-CoV-2 RNA is generally detectable in upper respiratory specimens during the acute phase of infection. The  lowest concentration of SARS-CoV-2 viral copies this assay can detect is 138 copies/mL. A negative result does not preclude SARS-Cov-2 infection and should not be used as the sole basis for treatment or other patient  management decisions. A negative result may occur with  improper specimen collection/handling, submission of specimen other than nasopharyngeal swab, presence of viral mutation(s) within the areas targeted by this assay, and inadequate number of viral copies(<138 copies/mL). A negative result must be combined with clinical observations, patient history, and epidemiological information. The expected result is Negative.  Fact Sheet for Patients:  EntrepreneurPulse.com.au  Fact Sheet for Healthcare Providers:  IncredibleEmployment.be  This test is no t yet approved or cleared by the [place] FDA and  has been authorized for detection and/or diagnosis of SARS-CoV-2 by FDA under an Emergency Use Authorization (EUA). This EUA will remain  in effect (meaning this test can be used) for the duration of the COVID-19 declaration under Section 564(b)(1) of the Act, 21 U.S.C.section 360bbb-3(b)(1), unless the authorization is terminated  or revoked sooner.       Influenza A by PCR NEGATIVE NEGATIVE Final   Influenza B by PCR NEGATIVE NEGATIVE Final    Comment: (NOTE) The Xpert Xpress SARS-CoV-2/FLU/RSV plus assay is intended as an aid in the diagnosis of influenza from Nasopharyngeal swab specimens and should not be used as a sole basis for treatment. Nasal washings and aspirates are unacceptable for Xpert Xpress SARS-CoV-2/FLU/RSV testing.  Fact Sheet for Patients: EntrepreneurPulse.com.au  Fact Sheet for Healthcare Providers: IncredibleEmployment.be  This test is not yet approved or cleared by the [place] FDA and has been authorized for detection and/or diagnosis of SARS-CoV-2 by FDA under an Emergency Use Authorization (EUA). This EUA will remain in effect (meaning this test can be used) for the duration of the COVID-19 declaration under Section 564(b)(1) of the Act, 21 U.S.C. section 360bbb-3(b)(1),  unless the authorization is terminated or revoked.  Performed at [facility], [place] [address], [place], [place] [address]   Surgical PCR screen     Status: None   Collection Time: 10/05/20  8:54 PM   Specimen: Nasal Mucosa; Nasal Swab  Result Value Ref Range Status   MRSA, PCR NEGATIVE NEGATIVE Final   Staphylococcus aureus NEGATIVE NEGATIVE Final    Comment: (NOTE) The Xpert SA Assay (FDA approved for NASAL specimens in patients 62 years of age and older), is one component of a comprehensive surveillance program. It is not intended to diagnose infection nor to guide or monitor treatment. Performed at [facility], [place] [address], [place], [place] [address]   Culture, blood (routine x 2)     Status: None (Preliminary result)   Collection Time: 10/07/20  3:45 PM   Specimen: BLOOD  Result Value Ref Range Status   Specimen Description BLOOD RIGHT ANTECUBITAL  Final   Special Requests   Final    BOTTLES DRAWN AEROBIC AND ANAEROBIC Blood Culture adequate volume   Culture   Final    NO GROWTH 2 DAYS Performed at [facility], [place] [address] [place], [name] [address]    Report Status PENDING  Incomplete  Culture, blood (routine x 2)     Status: None (Preliminary result)   Collection Time: 10/07/20  3:54 PM   Specimen: BLOOD  Result Value Ref Range Status   Specimen Description BLOOD RIGHT ANTECUBITAL  Final   Special Requests   Final    BOTTLES DRAWN AEROBIC ONLY Blood Culture adequate volume  Culture   Final    NO GROWTH 2 DAYS Performed at [facility], [place] [address], [place], [place] [address]    Report Status PENDING  Incomplete         Radiology Studies: No results found.      Scheduled Meds: . albuterol  1 puff Inhalation BID  . docusate sodium  100 mg Oral BID  . fluticasone furoate-vilanterol  1 puff Inhalation Daily  . gabapentin  300 mg Oral TID  . insulin aspart  0-20 Units Subcutaneous TID WC  . insulin  aspart  0-5 Units Subcutaneous QHS  . insulin glargine  50 Units Subcutaneous Daily  . ketorolac  15 mg Intravenous Q6H  . metroNIDAZOLE  500 mg Oral Q8H  . omeprazole  20 mg Oral Daily  . rosuvastatin  20 mg Oral Daily   Continuous Infusions: . sodium chloride Stopped (10/07/20 1903)  . cefTRIAXone (ROCEPHIN)  IV 2 g (10/08/20 1614)  . methocarbamol (ROBAXIN) IV       LOS: 5 days    Time spent:40 min    Petra Dumler, Geraldo Docker, MD [facility] Pager [phone]  If 7PM-7AM, please contact night-coverage [email or web address] Password [email or web address] 10/09/2020, 1:46 PM

## 2020-10-09 NOTE — Progress Notes (Signed)
ID Pharmacy Note   Ms. Eichholz's blood cultures from 11/27 are now growing bacteroides fragilis in 1/2. Spoke with Dr. Earlene Plater and will add oral flagyl 500 mg Q 8 hours to treat through 10/16/2020.   Sharin Mons, PharmD, BCPS, BCIDP Infectious Diseases Clinical Pharmacist Phone: [phone] 10/09/2020 1:04 PM

## 2020-10-09 NOTE — Progress Notes (Signed)
Physical Therapy Treatment Patient Details Name: Yesenia Payne MRN: [id number] DOB: Aug 11, 1951 Today's Date: 10/09/2020    History of Present Illness 69 y.o.-year-old female medical history significant for hypertension, iddm, hyperlipidemia presented to ED 11/27 when L heel abcess burst. Admitted for treatment of abscess and necrotic tissue left foot and leg s/p 11/29 L transtibial amputation.     PT Comments    Therapist found pt supine in bed. Therapist providing education on purpose of recommendation to SNF due to increased assistance needed for functional mobility tasks. Pt verbalized understanding but stated she will go home. Therapist educated pt on equipment needed to safely d/c home, pt verbalized understanding. Therapist providing education on plan for session and then pt stated she will need bedpan and it will take awhile to go. Pt requiring mod A for rolling L with use of bedrail. Pt demonstrating deficits in strength, endurance, coordination, bed mobility, safety, transfers and knowledge of deficits. Pt will benefit from skilled PT to address deficits to maximize independence with functional mobiltiy. PT is still recommending SNF for d/c for safety     Follow Up Recommendations  SNFvs Supervision/Assistance - 24 hour;Home health PT; pending family training and demonstrating competency with use of equipment. PT recommending SNF for safety due to increased assist needed for all mobility tasks.      Equipment Recommendations  Wheelchair (measurements PT);Wheelchair cushion (measurements PT);3in1 (PT);Hospital bed;Other (comment) (hoyer lift)    Recommendations for Other Services       Precautions / Restrictions Precautions Precautions: Fall Precaution Comments: new L BKA  Restrictions Weight Bearing Restrictions: Yes LLE Weight Bearing: Non weight bearing    Mobility  Bed Mobility Overal bed mobility: Needs Assistance Bed Mobility: Rolling Rolling: Mod assist          General bed mobility comments: mod A for rolling to L for placement of bed pan with use of bedrails  Transfers                    Ambulation/Gait                 Stairs             Wheelchair Mobility    Modified Rankin (Stroke Patients Only)       Balance                                            Cognition                                              Exercises      General Comments        Pertinent Vitals/Pain      Home Living                      Prior Function            PT Goals (current goals can now be found in the care plan section) Acute Rehab PT Goals Patient Stated Goal: feel better PT Goal Formulation: With patient/family Time For Goal Achievement: 10/21/20 Potential to Achieve Goals: Fair Progress towards PT goals: Not progressing toward goals - comment (pt requiring use of bedpan once therapist educated on plan of session)  Frequency    Min 3X/week      PT Plan Frequency needs to be updated;Discharge plan needs to be updated    Co-evaluation              AM-PAC PT "6 Clicks" Mobility   Outcome Measure  Help needed turning from your back to your side while in a flat bed without using bedrails?: A Lot Help needed moving from lying on your back to sitting on the side of a flat bed without using bedrails?: Total Help needed moving to and from a bed to a chair (including a wheelchair)?: Total Help needed standing up from a chair using your arms (e.g., wheelchair or bedside chair)?: Total Help needed to walk in hospital room?: Total Help needed climbing 3-5 steps with a railing? : Total 6 Click Score: 7    End of Session   Activity Tolerance: Other (comment) (pt limited by need to use bedpan as soon as session started) Patient left: in bed;with call bell/phone within reach Nurse Communication: Mobility status PT Visit Diagnosis: Unsteadiness on feet  (R26.81);Other abnormalities of gait and mobility (R26.89);Muscle weakness (generalized) (M62.81);Difficulty in walking, not elsewhere classified (R26.2);Pain Pain - Right/Left: Left Pain - part of body: Leg     Time: 7616-0737 PT Time Calculation (min) (ACUTE ONLY): 11 min  Charges:  $Therapeutic Activity: 8-22 mins                     Yesenia Payne, DPT Acute Rehabilitation Services [phone]   Lucretia Field 10/09/2020, 12:23 PM

## 2020-10-09 NOTE — Progress Notes (Signed)
Patient is status post below-knee amputation postop day #4.  She is lying in bed with TV on sleeping   Wound vacs placed with 2 black x but currently still functioning.  As long as wound VAC functions we will leave it on until discharge to nursing facility.  It will be removed and a dry dressing placed at that time

## 2020-10-09 NOTE — TOC Initial Note (Addendum)
Transition of Care [facility]) - Initial/Assessment Note    Patient Details  Name: Yesenia Payne MRN: [id number] Date of Birth: January 10, 1951  [name] CM/SW Contact:    Eduard Roux, LCSWA Phone Number: 10/09/2020, 11:45 AM  Clinical Narrative:                  CSW visit with patient at bedside along with her daughter,[phone]). Efraim Kaufmann states she lives in the home with the patient and will be with her once she is discharged from the hospital. She explains the patient's spouse is unable to handle/lift and provide the level of care needed for the patient. Patient currently refusing SNF- states she wants to go home. CSW discussed with patient and Melissa the possibility of SNF as back up plan. Melissa states she does not believe they will need SNF, however, they both agreed to SNF as back up plan. CSW explained the SNF process.  Melissa states their HH preference is [facility] or any agency that will accept. CSW answered all questions to their satisfaction.   CSW will provide bed offers once available CSW will continue to follow and assist with discharge planning.  Antony Blackbird, MSW, LCSWA Clinical Social Worker   Expected Discharge Plan: Home w Home Health Services Barriers to Discharge:  (patient currently refusing SNF)   Patient Goals and CMS Choice        Expected Discharge Plan and Services Expected Discharge Plan: Home w Home Health Services In-house Referral: Clinical Social Work                                            Prior Living Arrangements/Services   Lives with:: Self, Spouse, Adult Children Patient language and need for interpreter reviewed:: No Do you feel safe going back to the place where you live?: Yes      Need for Family Participation in Patient Care: No (Comment) Care giver support system in place?: Yes (comment)   Criminal Activity/Legal Involvement Pertinent to Current Situation/Hospitalization: No - Comment as  needed  Activities of Daily Living Home Assistive Devices/Equipment: None ADL Screening (condition at time of admission) Patient's cognitive ability adequate to safely complete daily activities?: Yes Is the patient deaf or have difficulty hearing?: Yes Does the patient have difficulty seeing, even when wearing glasses/contacts?: No Does the patient have difficulty concentrating, remembering, or making decisions?: No Patient able to express need for assistance with ADLs?: Yes Does the patient have difficulty dressing or bathing?: Yes Independently performs ADLs?: No Does the patient have difficulty walking or climbing stairs?: Yes Weakness of Legs: Both Weakness of Arms/Hands: None  Permission Sought/Granted Permission sought to share information with : Family Supports Permission granted to share information with : Yes, Verbal Permission Granted  Share Information with NAME: Melissa  Permission granted to share info w AGENCY: SNFs  Permission granted to share info w Relationship: daughter  Permission granted to share info w Contact Information: [phone]  Emotional Assessment Appearance:: Appears stated age Attitude/Demeanor/Rapport: Engaged Affect (typically observed): Appropriate Orientation: : Oriented to Self, Oriented to Place, Oriented to  Time, Oriented to Situation Alcohol / Substance Use: Not Applicable Psych Involvement: No (comment)  Admission diagnosis:  Necrotizing cellulitis [L03.90] Atrial fibrillation with RVR (HCC) [I48.91] Sepsis (HCC) [A41.9] Sepsis, due to unspecified organism, unspecified whether acute organ dysfunction present [facility]) [A41.9] Patient Active Problem List  Diagnosis Date Noted  . Morbidly obese (HCC) 10/09/2020  . Sepsis (HCC) 10/05/2020  . Pressure injury of skin 10/05/2020  . Cutaneous abscess of left foot   . Anemia   . Necrotizing cellulitis 10/04/2020  . DOE (dyspnea on exertion) 01/18/2017  . Benign essential HTN 01/18/2017  .  PAC (premature atrial contraction) 01/18/2017   PCP:  Merri Brunette, MD Pharmacy:   [facility] [address] ([place]), [place] [address] [place] ([place]) [place] [address] Phone: [phone] Fax: [phone]  [facility] [place] - [address] [place] [address] [address] [place] [address] Phone: [phone] Fax: [phone]     Social Determinants of Health (SDOH) Interventions    Readmission Risk Interventions No flowsheet data found.

## 2020-10-09 NOTE — [place] FL2 (Signed)
[place] MEDICAID FL2 LEVEL OF CARE SCREENING TOOL     IDENTIFICATION  Patient Name: Yesenia Payne Birthdate: [birth year] Sex: female Admission Date (Current Location): 10/04/2020  [facility] and [place] Number:  Producer, television/film/video and Address:  [facility], [address], [place], [place] [address]      Provider Number: [id number]  Attending Physician Name and Address:  Drema Dallas, MD  Relative Name and Phone Number:       Current Level of Care: Hospital Recommended Level of Care: Skilled Nursing Facility Prior Approval Number:    Date Approved/Denied:   PASRR Number: [id number]  Discharge Plan: SNF    Current Diagnoses: Patient Active Problem List   Diagnosis Date Noted  . Morbidly obese (HCC) 10/09/2020  . Sepsis (HCC) 10/05/2020  . Pressure injury of skin 10/05/2020  . Cutaneous abscess of left foot   . Anemia   . Necrotizing cellulitis 10/04/2020  . DOE (dyspnea on exertion) 01/18/2017  . Benign essential HTN 01/18/2017  . PAC (premature atrial contraction) 01/18/2017    Orientation RESPIRATION BLADDER Height & Weight     Self, Time, Situation, Place  Normal External catheter, Continent Weight: (!) 307 lb (139.3 kg) Height:  5' 3.25" (160.7 cm)  BEHAVIORAL SYMPTOMS/MOOD NEUROLOGICAL BOWEL NUTRITION STATUS      Continent Diet (please see discharge summary)  AMBULATORY STATUS COMMUNICATION OF NEEDS Skin     Verbally Surgical wounds (pressure injury stage II, closed incision LFT lower leg, wound incision MASD RT/LFT  breast, wound incision MASD  RT/LFT abdomen, wound incision MASD RT/LFT groin, Neg press Wound therapy LFT prestibial)                       Personal Care Assistance Level of Assistance  Bathing, Feeding, Dressing Bathing Assistance: Limited assistance Feeding assistance: Independent Dressing Assistance: Limited assistance     Functional Limitations Info  Sight, Hearing, Speech Sight Info:  Adequate Hearing Info: Adequate Speech Info: Adequate    SPECIAL CARE FACTORS FREQUENCY  PT (By licensed PT), OT (By licensed OT)     PT Frequency: 5x per week OT Frequency: 5x per week            Contractures Contractures Info: Not present    Additional Factors Info  Code Status, Allergies, Insulin Sliding Scale Code Status Info: FULL Allergies Info: Iodinated Diagnostic Agents,Metrizamide   Insulin Sliding Scale Info: see discharge summary       Current Medications (10/09/2020):  This is the current hospital active medication list Current Facility-Administered Medications  Medication Dose Route Frequency Provider Last Rate Last Admin  . 0.9 %  sodium chloride infusion   Intravenous Continuous [name], MD   Stopped at 10/07/20 1903  . acetaminophen (TYLENOL) tablet 650 mg  650 mg Oral Q6H PRN [name], MD   650 mg at 10/07/20 0904  . albuterol (PROVENTIL) (2.5 MG/3ML) 0.083% nebulizer solution 2.5 mg  2.5 mg Nebulization Q4H PRN [name], MD      . albuterol (VENTOLIN HFA) 108 (90 Base) MCG/ACT inhaler 1 puff  1 puff Inhalation BID [name], MD   1 puff at 10/09/20 0758  . bisacodyl (DULCOLAX) suppository 10 mg  10 mg Rectal Daily PRN [name], MD      . cefTRIAXone (ROCEPHIN) 2 g in sodium chloride 0.9 % 100 mL IVPB  2 g Intravenous Q24H Gwynn Burly N, DO 200 mL/hr at  10/08/20 1614 2 g at 10/08/20 1614  . docusate sodium (COLACE) capsule 100 mg  100 mg Oral BID [name], MD   100 mg at 10/08/20 2205  . fluticasone furoate-vilanterol (BREO ELLIPTA) 200-25 MCG/INH 1 puff  1 puff Inhalation Daily [name], MD   1 puff at 10/05/20 1001  . gabapentin (NEURONTIN) capsule 300 mg  300 mg Oral TID Persons, [place], [place]   300 mg at 10/09/20 5465  . HYDROmorphone (DILAUDID) injection 1 mg  1 mg Intravenous Q2H PRN [name], MD   1 mg at 10/09/20 1009  . insulin aspart (novoLOG) injection 0-20 Units  0-20 Units Subcutaneous TID WC  [name], MD   3 Units at 10/09/20 [phone]  . insulin aspart (novoLOG) injection 0-5 Units  0-5 Units Subcutaneous QHS [name], MD   2 Units at 10/05/20 2218  . insulin glargine (LANTUS) injection 50 Units  50 Units Subcutaneous Daily [name], MD   50 Units at 10/09/20 [phone]  . ketorolac (TORADOL) 15 MG/ML injection 15 mg  15 mg Intravenous Q6H Persons, [place], PA   15 mg at 10/09/20 0542  . magnesium citrate solution 1 Bottle  1 Bottle Oral Once PRN [name], MD      . methocarbamol (ROBAXIN) tablet 500 mg  500 mg Oral Q6H PRN [name], MD   500 mg at 10/08/20 2205   Or  . methocarbamol (ROBAXIN) 500 mg in dextrose 5 % 50 mL IVPB  500 mg Intravenous Q6H PRN [name], MD      . metoCLOPramide (REGLAN) tablet 5-10 mg  5-10 mg Oral Q8H PRN [name], MD       Or  . metoCLOPramide (REGLAN) injection 5-10 mg  5-10 mg Intravenous Q8H PRN [name], MD      . omeprazole (PRILOSEC) capsule 20 mg  20 mg Oral Daily Swayze, Ava, DO   20 mg at 10/09/20 0853  . ondansetron (ZOFRAN) tablet 4 mg  4 mg Oral Q6H PRN [name], MD       Or  . ondansetron [facility]) injection 4 mg  4 mg Intravenous Q6H PRN [name], MD      . oxyCODONE-acetaminophen (PERCOCET/ROXICET) 5-325 MG per tablet 1-2 tablet  1-2 tablet Oral Q4H PRN Persons, [place], [place]   2 tablet at 10/09/20 0542  . polyethylene glycol (MIRALAX / GLYCOLAX) packet 17 g  17 g Oral Daily PRN [name], MD   17 g at 10/07/20 0859  . rosuvastatin (CRESTOR) tablet 20 mg  20 mg Oral Daily [name], MD   20 mg at 10/09/20 1275     Discharge Medications: Please see discharge summary for a list of discharge medications.  Relevant Imaging Results:  Relevant Lab Results:   Additional Information SSN [id number]  Eduard Roux, [place]

## 2020-10-09 NOTE — Progress Notes (Signed)
Spoke with PA Corrie Dandy "Dorothe Pea on call for [facility] orthopedics concerning daughter's request for scheduled percocet and dilaudid. PA does not feel comfortable having these medications scheduled. She stated that we will keep PRN and they will reassess tomorrow. Dr. Joseph Art was made aware of patient/daughter request earlier in day. Will make daughter aware. Thomas Hoff, RN

## 2020-10-09 NOTE — Progress Notes (Signed)
Mobility Specialist: Progress Note   10/09/20 1440  Mobility  Activity  (Bed Exercises)  Level of Assistance Minimal assist, patient does 75% or more  Assistive Device None  Mobility Response Tolerated well  Mobility performed by Mobility specialist  Bed Position Semi-fowlers  $Mobility charge 1 Mobility   Pre-Mobility: 75 HR, 137/76 BP, 100% SpO2 Post-Mobility: 83 HR, 153/87 BP, 97% SpO2  Pt performed hip and knee flexion and extension exercises in bed. Pt was also shown UE exercises she can perform on her own and with her daughter. Pt is very motivated to continue mobility.   [facility] Wyat Infinger Mobility Specialist

## 2020-10-10 DIAGNOSIS — A419 Sepsis, unspecified organism: Secondary | ICD-10-CM | POA: Diagnosis not present

## 2020-10-10 DIAGNOSIS — L02612 Cutaneous abscess of left foot: Secondary | ICD-10-CM | POA: Diagnosis not present

## 2020-10-10 DIAGNOSIS — I4891 Unspecified atrial fibrillation: Secondary | ICD-10-CM | POA: Diagnosis not present

## 2020-10-10 LAB — CBC WITH DIFFERENTIAL/PLATELET
Abs Immature Granulocytes: 0.46 10*3/uL — ABNORMAL HIGH (ref 0.00–0.07)
Basophils Absolute: 0.1 10*3/uL (ref 0.0–0.1)
Basophils Relative: 1 %
Eosinophils Absolute: 0.4 10*3/uL (ref 0.0–0.5)
Eosinophils Relative: 3 %
HCT: 33.3 % — ABNORMAL LOW (ref 36.0–46.0)
Hemoglobin: 10.8 g/dL — ABNORMAL LOW (ref 12.0–15.0)
Immature Granulocytes: 4 %
Lymphocytes Relative: 11 %
Lymphs Abs: 1.3 10*3/uL (ref 0.7–4.0)
MCH: 28.3 pg (ref 26.0–34.0)
MCHC: 32.4 g/dL (ref 30.0–36.0)
MCV: 87.2 fL (ref 80.0–100.0)
Monocytes Absolute: 0.5 10*3/uL (ref 0.1–1.0)
Monocytes Relative: 4 %
Neutro Abs: 9.4 10*3/uL — ABNORMAL HIGH (ref 1.7–7.7)
Neutrophils Relative %: 77 %
Platelets: 243 10*3/uL (ref 150–400)
RBC: 3.82 MIL/uL — ABNORMAL LOW (ref 3.87–5.11)
RDW: 15.5 % (ref 11.5–15.5)
WBC: 12.1 10*3/uL — ABNORMAL HIGH (ref 4.0–10.5)
nRBC: 0 % (ref 0.0–0.2)

## 2020-10-10 LAB — GLUCOSE, CAPILLARY
Glucose-Capillary: 145 mg/dL — ABNORMAL HIGH (ref 70–99)
Glucose-Capillary: 130 mg/dL — ABNORMAL HIGH (ref 70–99)
Glucose-Capillary: 107 mg/dL — ABNORMAL HIGH (ref 70–99)
Glucose-Capillary: 137 mg/dL — ABNORMAL HIGH (ref 70–99)

## 2020-10-10 LAB — COMPREHENSIVE METABOLIC PANEL
ALT: 17 U/L (ref 0–44)
AST: 15 U/L (ref 15–41)
Albumin: 1.4 g/dL — ABNORMAL LOW (ref 3.5–5.0)
Alkaline Phosphatase: 110 U/L (ref 38–126)
Anion gap: 9 (ref 5–15)
BUN: 11 mg/dL (ref 8–23)
CO2: 24 mmol/L (ref 22–32)
Calcium: 7.9 mg/dL — ABNORMAL LOW (ref 8.9–10.3)
Chloride: 105 mmol/L (ref 98–111)
Creatinine, Ser: 0.77 mg/dL (ref 0.44–1.00)
GFR, Estimated: >60 mL/min (ref >60)
Glucose, Bld: 165 mg/dL — ABNORMAL HIGH (ref 70–99)
Potassium: 4.3 mmol/L (ref 3.5–5.1)
Sodium: 138 mmol/L (ref 135–145)
Total Bilirubin: 0.5 mg/dL (ref 0.3–1.2)
Total Protein: 5.9 g/dL — ABNORMAL LOW (ref 6.5–8.1)

## 2020-10-10 LAB — AEROBIC CULTURE W GRAM STAIN (SUPERFICIAL SPECIMEN): Gram Stain: NONE SEEN

## 2020-10-10 LAB — MAGNESIUM: Magnesium: 2 mg/dL (ref 1.7–2.4)

## 2020-10-10 LAB — PHOSPHORUS: Phosphorus: 3.8 mg/dL (ref 2.5–4.6)

## 2020-10-10 LAB — OCCULT BLOOD X 1 CARD TO LAB, STOOL: Fecal Occult Bld: NEGATIVE

## 2020-10-10 MED ORDER — COLLAGENASE 250 UNIT/GM EX OINT
TOPICAL_OINTMENT | Freq: Every day | CUTANEOUS | Status: DC
  Administered 2020-10-11 – 2020-10-14 (×2): 1 via TOPICAL
  Filled 2020-10-10: qty 30

## 2020-10-10 MED ORDER — OXYCODONE HCL ER 10 MG PO T12A
10.0000 mg | EXTENDED_RELEASE_TABLET | Freq: Two times a day (BID) | ORAL | Status: DC
  Administered 2020-10-10 – 2020-10-14 (×10): 10 mg via ORAL
  Filled 2020-10-10 (×10): qty 1

## 2020-10-10 NOTE — Progress Notes (Addendum)
PROGRESS NOTE    Yesenia Payne  HWT:[id number] DOB: [birth year] DOA: 10/04/2020 PCP: Carol Ada, MD     Brief Narrative:  69 y.o.WF PMHx HTN, HLD, DM type II   Presents to the ED for chief concern of left lower leg wound that started about two days ago.  She reports she only noticed the wound 2 days ago, although family states that it had been present for a couple of weeks. She presented to the emergency department because the wound burst today.  She states the leg has been red and swollen for about 1-2 weeks. She denies presence of wounds to her knowledgeprior to twodays ago. She denies trauma to the lower extremity.  ROS was negative for headache, vision changes, dysphagia, odynophagia, cough, chest pain, shortness of breath, abdominal pain, dysuria, hematuria, diarrhea, blood in her stool, constipation.  X-ray of the left lower extremity demonstrate gas in the dorsal and plantar aspects of the foot and heel. Started on IV Ancef and Clindamycin. She was transferred to [facility]. Dr. Sharol Given has been consulted. He has evaluated the patient.  S/P transtibial amputation on the morning of 10/06/2020.  The patient has had 2/2 blood cultures grow out Strep species and p. Mirabilis. She had been receiving cefazolin and vancomycin from admission. Echocardiogram was ordered and did not demonstrate vegetations. Repeat blood cultures have been ordered. Infectious disease was consulted. Dr. Juleen China narrowed her antibiotics to Ceftriaxone 2 gm IV daily.   Subjective: 12/3 afebrile overnight A/O x4.  Positive fatigue secondary to working with physical therapy.  Patient complaining of funny noise coming from wound VAC.  Appears to be air leak.   Assessment & Plan: Covid vaccination;   Active Problems:   Necrotizing cellulitis   Sepsis ([place])   Cutaneous abscess of left foot   Anemia   Pressure injury of skin   Morbidly obese (HCC)  Necrotizing  cellulitis/cutaneous abscess LEFT foot -S/p 11/29 transtibial amputation see results below -Per Dr. Sharol Given to orthopedic surgery care plan is as follows;  Weightbearing: NWB left  Pain medication: opoid pathway  Dressing care/ Wound JZP:[id number] for 1 week  Discharge to: SNF -Antibiotics per ID  Bacteremia positive Proteus Mirabella's/Strep species/Bacteroides fragilis/Enterobacterales -Continue antibiotics. 10 Days post surgery date. per Dr. Jule Ser ID. . -12/3 per ID once blood cultures negative for 48 hours PICC line may be placed  Septic shock -On admission patient met criteria for septic shock temp> 38 C, HR> 90, RR> 20, lactic acid> 2 site of infection left lower leg -on admission seen by PCCM secondary to concern for need of vasopressors. -Resolved  DM type II uncontrolled with hyperglycemia -11/27 hemoglobin A1c= 7.2 -Lantus 50 units daily -Resistant SSI  Anemia unspecified -Occult blood pending -Anemia panel pending  HTN -Controlled without medication continue to monitor closely  Orbitally obese (BMI 53.95 kg/m) -After patient recovers would benefit possibly from bariatric surgery  Multiple pressure injuries:  1-2. Bruising noted over the posterior left thigh and left calf unclear etiology  3. Deep tissue &pressure injury right ischial tuberosity; 100% dark purple non blanchable tissue  4. Unstageable pressure injury sacrum; 50% pink/50% black  Pressure Injury POA: Yes  Measurement:  Right ischium: 11cm x 6cm x 0cm  Sacrum: 9cm x 5cm x 0.2cm" Pressure Injury 10/05/20 Stage 2 -  Partial thickness loss of dermis presenting as a shallow open injury with a red, pink wound bed without slough. multiple wounds on buttocks (Active)  10/05/20 0915  Location:  Location Orientation:   Staging: Stage 2 -  Partial thickness loss of dermis presenting as a shallow open injury with a red, pink wound bed without slough.  Wound Description (Comments): multiple wounds  on buttocks  Present on Admission: Yes   Goals of care -12/2 discussed with patient and daughter per Dr. Jess Barters note he recommended SNF for patient however patient and daughter refusing SNF at this time.  I stated they needed to discuss this with Dr. Sharol Given.    I have seen and examined this patient myself. I have spent 38 minutes in her evaluation and care. More than 50% of this was spent in coordination of care.   DVT prophylaxis:  Code Status: Full Family Communication: 12/1 daughter at bedside discussed plan of care answered all questions Status is: Inpatient    Dispo: The patient is from: Home              Anticipated d/c is to: SNF              Anticipated d/c date is: Per surgery              Patient currently unstable      Consultants:  ID Orthopedic surgery   Procedures/Significant Events:  Xray Left Foot: Superficial ulceration in the plantar soft tissues over the calcaneus. Soft tissue gas over the plantar and dorsal aspects of the foot consistent with cellulitis due to gas-forming organism. No evidence of osteomyelitis.  11/29 LeftTranstibial amputation Application of Prevena wound VAC   I have personally reviewed and interpreted all radiology studies and my findings are as above.  VENTILATOR SETTINGS:    Cultures 10/04/20 blood RIGHT AC positive Proteus Mirabella's/Strep species/Bacteroides fragilis/Enterobacterales  11/27 blood LEFT hand positive Proteus Mirabella's, Streptococcus Constellatus 11/27 wound positive Proteus Mirabilis, Staphylococcus Pseudintermedius  10/07/20 blood RIGHT AC NGTD x2 10/05/20 urine culture:   Antimicrobials: Anti-infectives (From admission, onward)   Start     Ordered Stop   10/09/20 1400  metroNIDAZOLE (FLAGYL) tablet 500 mg        10/09/20 1303     10/07/20 1645  cefTRIAXone (ROCEPHIN) 2 g in sodium chloride 0.9 % 100 mL IVPB        10/07/20 1552     10/06/20 0600  ceFAZolin (ANCEF) 3 g in dextrose 5 % 50 mL IVPB   Status:  Discontinued        10/05/20 1040 10/06/20 1142   10/05/20 1200  vancomycin (VANCOCIN) IVPB 1000 mg/200 mL premix  Status:  Discontinued        10/05/20 0022 10/07/20 1552   10/05/20 1115  ceFAZolin (ANCEF) 3 g in dextrose 5 % 50 mL IVPB  Status:  Discontinued        10/05/20 1017 10/05/20 1040   10/05/20 0800  clindamycin (CLEOCIN) IVPB 600 mg  Status:  Discontinued        10/04/20 2359 10/07/20 1552   10/05/20 0600  piperacillin-tazobactam (ZOSYN) IVPB 3.375 g  Status:  Discontinued        10/05/20 0004 10/07/20 1552   10/05/20 0000  piperacillin-tazobactam (ZOSYN) IVPB 3.375 g  Status:  Discontinued        10/04/20 2359 10/05/20 0003   10/04/20 2300  piperacillin-tazobactam (ZOSYN) IVPB 3.375 g        10/04/20 2246 10/04/20 2327   10/04/20 2300  vancomycin (VANCOREADY) IVPB 2000 mg/400 mL        10/04/20 2246 10/05/20 0231   10/04/20 2300  clindamycin (CLEOCIN)  IVPB 600 mg        10/04/20 2249 10/05/20 0006       Devices    LINES / TUBES:  Wound VAC 7/29>>    Continuous Infusions: . sodium chloride Stopped (10/07/20 1903)  . cefTRIAXone (ROCEPHIN)  IV 2 g (10/09/20 1827)  . methocarbamol (ROBAXIN) IV       Objective: Vitals:   10/10/20 0000 10/10/20 0400 10/10/20 0733 10/10/20 0800  BP: 119/64 126/70  (!) 143/74  Pulse: 71 64  72  Resp: _0 Temp: 97.6 F (36.4 C) 97.8 F (36.6 C)  97.6 F (36.4 C)  TempSrc: Oral Oral  Oral  SpO2: 95% 95% 97% 97%  Weight:      Height:        Intake/Output Summary (Last 24 hours) at 10/10/2020 1137 Last data filed at 10/10/2020 0762 Gross per 24 hour  Intake 480 ml  Output 950 ml  Net -470 ml   Filed Weights   10/04/20 2136 10/07/20 0552 10/08/20 0500  Weight: 124.7 kg (!) 142 kg (!) 139.3 kg    Examination:  General: A/O x4, No acute respiratory distress Eyes: negative scleral hemorrhage, negative anisocoria, negative icterus ENT: Negative Runny nose, negative gingival bleeding, Neck:  Negative  scars, masses, torticollis, lymphadenopathy, JVD Lungs: Clear to auscultation bilaterally without wheezes or crackles Cardiovascular: Regular rate and rhythm without murmur gallop or rub normal S1 and S2 Abdomen: MORBIDLY OBESE, negative abdominal pain, nondistended, positive soft, bowel sounds, no rebound, no ascites, no appreciable mass EXTREMITIES: left AKA with wound VAC in place Skin: Negative rashes, lesions, ulcers Psychiatric:  Negative depression, negative anxiety, negative fatigue, negative mania  Central nervous system:  Cranial nerves II through XII intact, tongue/uvula midline, all extremities muscle strength 5/5, sensation intact throughout, negative dysarthria, negative expressive aphasia, negative receptive aphasia.  .     Data Reviewed: Care during the described time interval was provided by me .  I have reviewed this patient's available data, including medical history, events of note, physical examination, and all test results as part of my evaluation.  CBC: Recent Labs  Lab 10/04/20 2220 10/04/20 2220 10/05/20 0517 10/07/20 0139 10/08/20 1014 10/09/20 0252 10/10/20 0213  WBC 29.4*   < > 23.7* 18.6* 13.1* 11.1* 12.1*  NEUTROABS 25.0*  --   --  14.6* 10.5* 7.8* 9.4*  HGB 10.3*   < > 8.0* 10.8* 10.6* 9.8* 10.8*  HCT 34.1*   < > 25.5* 33.6* 33.0* 32.0* 33.3*  MCV 91.4   < > 89.5 85.9 86.2 90.7 87.2  PLT 344   < > 285 342 293 258 243   < > = values in this interval not displayed.   Basic Metabolic Panel: Recent Labs  Lab 10/05/20 0517 10/06/20 0653 10/07/20 0139 10/07/20 0720 10/08/20 0121 10/08/20 1014 10/09/20 0252 10/10/20 0507  NA 136  --  138  --   --  137 135 138  K 3.9  --  5.9* 3.8  --  4.3 4.1 4.3  CL 106  --  105  --   --  104 104 105  CO2 20*  --  19*  --   --  _1 GLUCOSE 93  --  168*  --   --  97 179* 165*  BUN 25*  --  17  --   --  _2 CREATININE 0.99   < > 0.97  --  0.89 0.80 0.82 0.77  CALCIUM 7.4*  --  7.9*  --   --  8.0*  7.5* 7.9*  MG  --   --   --   --   --  1.9 1.9 2.0  PHOS  --   --   --   --   --  3.4 3.8 3.8   < > = values in this interval not displayed.   GFR: Estimated Creatinine Clearance: 90.4 mL/min (by C-G formula based on SCr of 0.77 mg/dL). Liver Function Tests: Recent Labs  Lab 10/04/20 2220 10/07/20 0139 10/08/20 1014 10/09/20 0252 10/10/20 0507  AST 65* 41 _0 ALT 35 _1 ALKPHOS 164* 142* 143* 111 110  BILITOT 1.0 1.3* 0.6 0.6 0.5  PROT 7.4 6.5 6.7 5.6* 5.9*  ALBUMIN 2.0* 1.5* 1.5* 1.4* 1.4*   No results for input(s): LIPASE, AMYLASE in the last 168 hours. No results for input(s): AMMONIA in the last 168 hours. Coagulation Profile: Recent Labs  Lab 10/04/20 2220  INR 1.3*   Cardiac Enzymes: No results for input(s): CKTOTAL, CKMB, CKMBINDEX, TROPONINI in the last 168 hours. BNP (last 3 results) No results for input(s): PROBNP in the last 8760 hours. HbA1C: No results for input(s): HGBA1C in the last 72 hours. CBG: Recent Labs  Lab 10/09/20 0631 10/09/20 1243 10/09/20 1640 10/09/20 2132 10/10/20 0612  GLUCAP 131* 144* 124* 140* 145*   Lipid Profile: No results for input(s): CHOL, HDL, LDLCALC, TRIG, CHOLHDL, LDLDIRECT in the last 72 hours. Thyroid Function Tests: No results for input(s): TSH, T4TOTAL, FREET4, T3FREE, THYROIDAB in the last 72 hours. Anemia Panel: Recent Labs    10/07/20 1156 10/09/20 0840  VITAMINB12 845 690  FOLATE 15.5 9.8  FERRITIN 396* 227  TIBC 150* 183*  IRON 32 39  RETICCTPCT 1.3 1.9   Sepsis Labs: Recent Labs  Lab 10/04/20 2220 10/05/20 0016  LATICACIDVEN 2.6* 2.2*    Recent Results (from the past 240 hour(s))  Urine culture     Status: Abnormal   Collection Time: 10/04/20 10:20 PM   Specimen: In/Out Cath Urine  Result Value Ref Range Status   Specimen Description   Final    IN/OUT CATH URINE Performed at [facility], [place] [address], [place], [place] [address]    Special Requests    Final    NONE Performed at [facility], [place] [address] [place], [place] [address]    Culture MULTIPLE SPECIES PRESENT, SUGGEST RECOLLECTION (A)  Final   Report Status 10/06/2020 FINAL  Final  Blood Culture (routine x 2)     Status: Abnormal   Collection Time: 10/04/20 10:20 PM   Specimen: BLOOD  Result Value Ref Range Status   Specimen Description   Final    BLOOD RIGHT ANTECUBITAL Performed at [facility] [address] [place], [place] [address]    Special Requests   Final    BOTTLES DRAWN AEROBIC AND ANAEROBIC Blood Culture results may not be optimal due to an inadequate volume of blood received in culture bottles Performed at [facility] [address], [place], [place] [address]    Culture  Setup Time   Final    GRAM NEGATIVE RODS ANAEROBIC BOTTLE ONLY GRAM POSITIVE COCCI IN PAIRS CRITICAL RESULT CALLED TO, READ BACK BY AND VERIFIED WITH: PHARMD J MILLEN [id number] AT 1544 BY CM GRAM POSITIVE COCCI AEROBIC BOTTLE ONLY    Culture (A)  Final    PROTEUS MIRABILIS  STREPTOCOCCUS CONSTELLATUS BACTEROIDES FRAGILIS BETA LACTAMASE POSITIVE Performed at [facility], [place] [address], [place], [place] [address]    Report Status 10/09/2020 FINAL  Final   Organism ID, Bacteria PROTEUS MIRABILIS  Final   Organism ID, Bacteria STREPTOCOCCUS CONSTELLATUS  Final      Susceptibility   Streptococcus constellatus - MIC*    PENICILLIN 0.25 INTERMEDIATE Intermediate     CEFTRIAXONE 1 SENSITIVE Sensitive     ERYTHROMYCIN <=0.12 SENSITIVE Sensitive     LEVOFLOXACIN 0.5 SENSITIVE Sensitive     VANCOMYCIN 0.5 SENSITIVE Sensitive     * STREPTOCOCCUS CONSTELLATUS   Proteus mirabilis - MIC*    AMPICILLIN >=32 RESISTANT Resistant     CEFAZOLIN 8 SENSITIVE Sensitive     CEFEPIME <=0.12 SENSITIVE Sensitive     CEFTAZIDIME <=1 SENSITIVE Sensitive     CEFTRIAXONE <=0.25 SENSITIVE Sensitive     CIPROFLOXACIN 2 INTERMEDIATE  Intermediate     GENTAMICIN <=1 SENSITIVE Sensitive     IMIPENEM 2 SENSITIVE Sensitive     TRIMETH/SULFA >=320 RESISTANT Resistant     AMPICILLIN/SULBACTAM 8 SENSITIVE Sensitive     PIP/TAZO <=4 SENSITIVE Sensitive     * PROTEUS MIRABILIS  Blood Culture ID Panel (Reflexed)     Status: Abnormal   Collection Time: 10/04/20 10:20 PM  Result Value Ref Range Status   Enterococcus faecalis NOT DETECTED NOT DETECTED Final   Enterococcus Faecium NOT DETECTED NOT DETECTED Final   Listeria monocytogenes NOT DETECTED NOT DETECTED Final   Staphylococcus species NOT DETECTED NOT DETECTED Final   Staphylococcus aureus (BCID) NOT DETECTED NOT DETECTED Final   Staphylococcus epidermidis NOT DETECTED NOT DETECTED Final   Staphylococcus lugdunensis NOT DETECTED NOT DETECTED Final   Streptococcus species DETECTED (A) NOT DETECTED Final    Comment: Not Enterococcus species, Streptococcus agalactiae, Streptococcus pyogenes, or Streptococcus pneumoniae. CRITICAL RESULT CALLED TO, READ BACK BY AND VERIFIED WITH: PHARMD J MILLEN [id number] ST 1545 BY CM    Streptococcus agalactiae NOT DETECTED NOT DETECTED Final   Streptococcus pneumoniae NOT DETECTED NOT DETECTED Final   Streptococcus pyogenes NOT DETECTED NOT DETECTED Final   A.calcoaceticus-baumannii NOT DETECTED NOT DETECTED Final   Bacteroides fragilis DETECTED (A) NOT DETECTED Final    Comment: CRITICAL RESULT CALLED TO, READ BACK BY AND VERIFIED WITH: PHARMD J MILLEN [id number] AT 1545 BY CM    Enterobacterales DETECTED (A) NOT DETECTED Final    Comment: Enterobacterales represent a large order of gram negative bacteria, not a single organism. CRITICAL RESULT CALLED TO, READ BACK BY AND VERIFIED WITH: PHARMD J MILLEN [id number] AT 1545 BY CM    Enterobacter cloacae complex NOT DETECTED NOT DETECTED Final   Escherichia coli NOT DETECTED NOT DETECTED Final   Klebsiella aerogenes NOT DETECTED NOT DETECTED Final   Klebsiella oxytoca NOT DETECTED NOT DETECTED  Final   Klebsiella pneumoniae NOT DETECTED NOT DETECTED Final   Proteus species DETECTED (A) NOT DETECTED Final    Comment: CRITICAL RESULT CALLED TO, READ BACK BY AND VERIFIED WITH: PHARMD J MILLEN [id number] AT 1545 BY CM    Salmonella species NOT DETECTED NOT DETECTED Final   Serratia marcescens NOT DETECTED NOT DETECTED Final   Haemophilus influenzae NOT DETECTED NOT DETECTED Final   Neisseria meningitidis NOT DETECTED NOT DETECTED Final   Pseudomonas aeruginosa NOT DETECTED NOT DETECTED Final   Stenotrophomonas maltophilia NOT DETECTED NOT DETECTED Final   Candida albicans NOT DETECTED NOT DETECTED Final   Candida auris NOT DETECTED NOT DETECTED Final  Candida glabrata NOT DETECTED NOT DETECTED Final   Candida krusei NOT DETECTED NOT DETECTED Final   Candida parapsilosis NOT DETECTED NOT DETECTED Final   Candida tropicalis NOT DETECTED NOT DETECTED Final   Cryptococcus neoformans/gattii NOT DETECTED NOT DETECTED Final   CTX-M ESBL NOT DETECTED NOT DETECTED Final   Carbapenem resistance IMP NOT DETECTED NOT DETECTED Final   Carbapenem resistance KPC NOT DETECTED NOT DETECTED Final   Carbapenem resistance NDM NOT DETECTED NOT DETECTED Final   Carbapenem resist OXA 48 LIKE NOT DETECTED NOT DETECTED Final   Carbapenem resistance VIM NOT DETECTED NOT DETECTED Final    Comment: Performed at [facility], [place] [address] [place], [place] [address]  Blood Culture (routine x 2)     Status: Abnormal   Collection Time: 10/04/20 10:21 PM   Specimen: BLOOD LEFT HAND  Result Value Ref Range Status   Specimen Description   Final    BLOOD LEFT HAND Performed at [facility] [address], [place], [place] [address]    Special Requests   Final    BOTTLES DRAWN AEROBIC AND ANAEROBIC Blood Culture adequate volume Performed at [facility] [address], [place], [place] [address]    Culture  Setup Time   Final    GRAM NEGATIVE RODS GRAM POSITIVE  COCCI IN CHAINS ANAEROBIC BOTTLE ONLY CRITICAL VALUE NOTED.  VALUE IS CONSISTENT WITH PREVIOUSLY REPORTED AND CALLED VALUE. GRAM POSITIVE COCCI IN CHAINS AEROBIC BOTTLE ONLY    Culture (A)  Final    PROTEUS MIRABILIS STREPTOCOCCUS CONSTELLATUS SUSCEPTIBILITIES PERFORMED ON PREVIOUS CULTURE WITHIN THE LAST 5 DAYS. Performed at [facility], [place] [address] [place], [place] [address]    Report Status 10/08/2020 FINAL  Final  Wound or Superficial Culture     Status: None   Collection Time: 10/04/20 10:30 PM   Specimen: Wound  Result Value Ref Range Status   Specimen Description   Final    WOUND Performed at [facility] [address], [place], [place] [address]    Special Requests   Final    LEFT FOOT Performed at [facility], [place] [address], [place], [place] [address]    Gram Stain   Final    NO WBC SEEN MODERATE GRAM POSITIVE COCCI IN PAIRS MODERATE GRAM NEGATIVE RODS Performed at [facility], [place] [address], [place], [place] [address]    Culture   Final    ABUNDANT PROTEUS MIRABILIS FEW STAPHYLOCOCCUS PSEUDINTERMEDIUS    Report Status 10/10/2020 FINAL  Final   Organism ID, Bacteria PROTEUS MIRABILIS  Final   Organism ID, Bacteria STAPHYLOCOCCUS PSEUDINTERMEDIUS  Final      Susceptibility   Proteus mirabilis - MIC*    AMPICILLIN >=32 RESISTANT Resistant     CEFAZOLIN 8 SENSITIVE Sensitive     CEFEPIME <=0.12 SENSITIVE Sensitive     CEFTAZIDIME <=1 SENSITIVE Sensitive     CEFTRIAXONE <=0.25 SENSITIVE Sensitive     CIPROFLOXACIN 2 INTERMEDIATE Intermediate     GENTAMICIN <=1 SENSITIVE Sensitive     IMIPENEM 2 SENSITIVE Sensitive     TRIMETH/SULFA >=320 RESISTANT Resistant     AMPICILLIN/SULBACTAM 4 SENSITIVE Sensitive     PIP/TAZO <=4 SENSITIVE Sensitive     * ABUNDANT PROTEUS MIRABILIS   Staphylococcus pseudintermedius - MIC*    CIPROFLOXACIN <=0.5 SENSITIVE Sensitive     ERYTHROMYCIN <=0.25 SENSITIVE Sensitive      GENTAMICIN <=0.5 SENSITIVE Sensitive  OXACILLIN <=0.25 SENSITIVE Sensitive     TETRACYCLINE <=1 SENSITIVE Sensitive     VANCOMYCIN <=0.5 SENSITIVE Sensitive     TRIMETH/SULFA <=10 SENSITIVE Sensitive     CLINDAMYCIN <=0.25 SENSITIVE Sensitive     RIFAMPIN <=0.5 SENSITIVE Sensitive     Inducible Clindamycin NEGATIVE Sensitive     * FEW STAPHYLOCOCCUS PSEUDINTERMEDIUS  Resp Panel by RT-PCR (Flu A&B, Covid) Foot, Left     Status: None   Collection Time: 10/04/20 11:06 PM   Specimen: Foot, Left; Nasopharyngeal(NP) swabs in vial transport medium  Result Value Ref Range Status   SARS Coronavirus 2 by RT PCR NEGATIVE NEGATIVE Final    Comment: (NOTE) SARS-CoV-2 target nucleic acids are NOT DETECTED.  The SARS-CoV-2 RNA is generally detectable in upper respiratory specimens during the acute phase of infection. The lowest concentration of SARS-CoV-2 viral copies this assay can detect is 138 copies/mL. A negative result does not preclude SARS-Cov-2 infection and should not be used as the sole basis for treatment or other patient management decisions. A negative result may occur with  improper specimen collection/handling, submission of specimen other than nasopharyngeal swab, presence of viral mutation(s) within the areas targeted by this assay, and inadequate number of viral copies(<138 copies/mL). A negative result must be combined with clinical observations, patient history, and epidemiological information. The expected result is Negative.  Fact Sheet for Patients:  EntrepreneurPulse.com.au  Fact Sheet for Healthcare Providers:  IncredibleEmployment.be  This test is no t yet approved or cleared by the [place] FDA and  has been authorized for detection and/or diagnosis of SARS-CoV-2 by FDA under an Emergency Use Authorization (EUA). This EUA will remain  in effect (meaning this test can be used) for the duration of the COVID-19  declaration under Section 564(b)(1) of the Act, 21 U.S.C.section 360bbb-3(b)(1), unless the authorization is terminated  or revoked sooner.       Influenza A by PCR NEGATIVE NEGATIVE Final   Influenza B by PCR NEGATIVE NEGATIVE Final    Comment: (NOTE) The Xpert Xpress SARS-CoV-2/FLU/RSV plus assay is intended as an aid in the diagnosis of influenza from Nasopharyngeal swab specimens and should not be used as a sole basis for treatment. Nasal washings and aspirates are unacceptable for Xpert Xpress SARS-CoV-2/FLU/RSV testing.  Fact Sheet for Patients: EntrepreneurPulse.com.au  Fact Sheet for Healthcare Providers: IncredibleEmployment.be  This test is not yet approved or cleared by the [place] FDA and has been authorized for detection and/or diagnosis of SARS-CoV-2 by FDA under an Emergency Use Authorization (EUA). This EUA will remain in effect (meaning this test can be used) for the duration of the COVID-19 declaration under Section 564(b)(1) of the Act, 21 U.S.C. section 360bbb-3(b)(1), unless the authorization is terminated or revoked.  Performed at [facility], [place] [address], [place], [place] [address]   Surgical PCR screen     Status: None   Collection Time: 10/05/20  8:54 PM   Specimen: Nasal Mucosa; Nasal Swab  Result Value Ref Range Status   MRSA, PCR NEGATIVE NEGATIVE Final   Staphylococcus aureus NEGATIVE NEGATIVE Final    Comment: (NOTE) The Xpert SA Assay (FDA approved for NASAL specimens in patients 82 years of age and older), is one component of a comprehensive surveillance program. It is not intended to diagnose infection nor to guide or monitor treatment. Performed at [facility], [place] [address], [place], [place] [address]   Culture, blood (routine x 2)     Status: None (Preliminary  result)   Collection Time: 10/07/20  3:45 PM   Specimen: BLOOD  Result Value Ref Range Status    Specimen Description BLOOD RIGHT ANTECUBITAL  Final   Special Requests   Final    BOTTLES DRAWN AEROBIC AND ANAEROBIC Blood Culture adequate volume   Culture   Final    NO GROWTH 3 DAYS Performed at [facility], [address] [place], [place] [address]    Report Status PENDING  Incomplete  Culture, blood (routine x 2)     Status: None (Preliminary result)   Collection Time: 10/07/20  3:54 PM   Specimen: BLOOD  Result Value Ref Range Status   Specimen Description BLOOD RIGHT ANTECUBITAL  Final   Special Requests   Final    BOTTLES DRAWN AEROBIC ONLY Blood Culture adequate volume   Culture   Final    NO GROWTH 3 DAYS Performed at [facility], [address], [place], [place] [address]    Report Status PENDING  Incomplete         Radiology Studies: No results found.      Scheduled Meds: . albuterol  1 puff Inhalation BID  . docusate sodium  100 mg Oral BID  . fluticasone furoate-vilanterol  1 puff Inhalation Daily  . gabapentin  300 mg Oral TID  . insulin aspart  0-20 Units Subcutaneous TID WC  . insulin aspart  0-5 Units Subcutaneous QHS  . insulin glargine  50 Units Subcutaneous Daily  . ketorolac  15 mg Intravenous Q6H  . metroNIDAZOLE  500 mg Oral Q8H  . omeprazole  20 mg Oral Daily  . oxyCODONE  10 mg Oral Q12H  . rosuvastatin  20 mg Oral Daily   Continuous Infusions: . sodium chloride Stopped (10/07/20 1903)  . cefTRIAXone (ROCEPHIN)  IV 2 g (10/09/20 1827)  . methocarbamol (ROBAXIN) IV       LOS: 6 days    Time spent:40 min    Adrian Specht, Geraldo Docker, MD [facility] Pager [phone]  If 7PM-7AM, please contact night-coverage [email or web address] Password [facility] 10/10/2020, 11:37 AM

## 2020-10-10 NOTE — Progress Notes (Signed)
Physical Therapy Treatment Patient Details Name: Yesenia Payne MRN: [id number] DOB: 04/30/1951 Today's Date: 10/10/2020    History of Present Illness 69 y.o.-year-old female medical history significant for hypertension, iddm, hyperlipidemia presented to ED 11/27 when L heel abcess burst. Admitted for treatment of abscess and necrotic tissue left foot and leg s/p 11/29 L transtibial amputation.     PT Comments    The pt was in bed upon arrival of PT, agreeable to session and eager to participate in OOB mobility facilitated by use of lift. However, upon initiation of movement, the pt was noted to have started having a BM in the bed, and then required assist of 2 to complete rolling and pericare. The pt remains motivated, but was unable to progress to OOB mobility this morning. PT will continue to follow and attempt to progress tolerance for OOB mobility and independence with bed mobility prior to anticipated d/c. Due to level of assist needed for simple bed mobility at this time, I continue to recommend SNF placement following d/c, but if family continues to refuse, pt will benefit from max HHPT and equipment noted below.    Follow Up Recommendations  SNF;Supervision/Assistance - 24 hour;Home health PT     Equipment Recommendations  Wheelchair (measurements PT);Wheelchair cushion (measurements PT);3in1 (PT);Hospital bed;Other (comment) (hoyer lift)    Recommendations for Other Services       Precautions / Restrictions Precautions Precautions: Fall Precaution Comments: new L BKA with wound vac Restrictions Weight Bearing Restrictions: Yes LLE Weight Bearing: Non weight bearing    Mobility  Bed Mobility Overal bed mobility: Needs Assistance Bed Mobility: Rolling Rolling: Mod assist         General bed mobility comments: modA of 1 for rolling with +1 to complete pericare/cleaning and placement of bed pan. Pt assists with rolling to L side, but requires modA due to  size  Transfers                 General transfer comment: deferred, pt using bed pan and RN asked to defer OOB mobility this morning      Balance                                            Cognition Arousal/Alertness: Awake/alert Behavior During Therapy: WFL for tasks assessed/performed Overall Cognitive Status: Impaired/Different from baseline Area of Impairment: Awareness                               General Comments: Pt unable to state needs such as using bathroom/bedpan in time to get assistance, unaware she had already started going.       Exercises      General Comments General comments (skin integrity, edema, etc.): VSS on RA      Pertinent Vitals/Pain Pain Assessment: Faces Faces Pain Scale: Hurts a little bit Pain Location: back and L LE Pain Descriptors / Indicators: Burning;Aching;Sharp;Throbbing Pain Intervention(s): Limited activity within patient's tolerance;Monitored during session;Patient requesting pain meds-RN notified;RN gave pain meds during session           PT Goals (current goals can now be found in the care plan section) Acute Rehab PT Goals Patient Stated Goal: to get OOB to [facility] or recliner PT Goal Formulation: With patient/family Time For Goal Achievement: 10/21/20 Potential to Achieve Goals: Fair Progress  towards PT goals: Progressing toward goals    Frequency    Min 3X/week      PT Plan Current plan remains appropriate       AM-PAC PT "6 Clicks" Mobility   Outcome Measure  Help needed turning from your back to your side while in a flat bed without using bedrails?: A Lot Help needed moving from lying on your back to sitting on the side of a flat bed without using bedrails?: Total Help needed moving to and from a bed to a chair (including a wheelchair)?: Total Help needed standing up from a chair using your arms (e.g., wheelchair or bedside chair)?: Total Help needed to walk in hospital  room?: Total Help needed climbing 3-5 steps with a railing? : Total 6 Click Score: 7    End of Session   Activity Tolerance: Patient tolerated treatment well;Other (comment) (pt need to use bed pan) Patient left: in bed;with call bell/phone within reach;with nursing/sitter in room Nurse Communication: Mobility status PT Visit Diagnosis: Unsteadiness on feet (R26.81);Other abnormalities of gait and mobility (R26.89);Muscle weakness (generalized) (M62.81);Difficulty in walking, not elsewhere classified (R26.2);Pain Pain - Right/Left: Left Pain - part of body: Leg     Time: 1749-4496 PT Time Calculation (min) (ACUTE ONLY): 25 min  Charges:  $Therapeutic Activity: 23-37 mins                     Rolm Baptise, PT, DPT   Acute Rehabilitation Department Pager #: [phone]   Yesenia Payne 10/10/2020, 10:53 AM

## 2020-10-10 NOTE — TOC Progression Note (Signed)
Transition of Care [facility]) - Progression Note    Patient Details  Name: Yesenia Payne MRN: [id number] Date of Birth: 1950-12-01  Transition of Care [facility]) CM/SW Contact  Eduard Roux, [place] Phone Number: 10/10/2020, 11:19 AM  Clinical Narrative:     CSW called spoke with the patient's daughter Efraim Kaufmann, informed of bed offer. She requested CSW stopped SNF search- she advised  She is retired Charity fundraiser and is can care for the patient- she will discharge home when medically stable.  Clinical Social Worker will sign off for now as social work intervention is no longer needed. Please consult [place] if new need arises.   Antony Blackbird, MSW, LCSWA Clinical Social Worker   Expected Discharge Plan: Home w Home Health Services Barriers to Discharge:  (patient currently refusing SNF)  Expected Discharge Plan and Services Expected Discharge Plan: Home w Home Health Services In-house Referral: Clinical Social Work                                             Social Determinants of Health (SDOH) Interventions    Readmission Risk Interventions No flowsheet data found.

## 2020-10-10 NOTE — TOC Progression Note (Addendum)
Transition of Care (TOC) - Progression Note  Marvetta Gibbons RN, BSN Transitions of Care Unit 4E- RN Case Manager See Treatment Team for direct phone #    Patient Details  Name: Yesenia Payne MRN: [id number] Date of Birth: 11-15-1950  Transition of Care [facility]) CM/SW Contact  Yesenia Client Romeo Rabon, RN Phone Number: 10/10/2020, 3:26 PM  Clinical Narrative:    Notified by [place]. That pt and daughter no longer want to pursue SNF and desire to return home with [facility].  This Probation officer met with patient and daughter at the bedside to discuss transition to home needs- Per conversation discussed DME and [place] needs as well as questions they had about finding a new Primary care provider. Also confirmed address and phone contacts in epic. Daughter Yesenia Payne will be contact for DME delivery.  DME needs that pt will need per PT- are hospital bed, lift, wheelchair and 3n1. Pt and daughter agree- they would also like to see if they can get an overbed table- Have spoken with MD regarding DME orders- once orders placed will refer to [facility] for DME needs and delivery.   Choice offered for [facility] needs - per daughter they would like to use [facility] or [facility] for [facility] services- Orders pending- will made referral once orders placed. - noted ID note that pt will need iv abx until 12/9- no PICC line at this time- will need PICC prior to discharge is plan if for IV abx. Discussed with Yesenia Payne from [employer] regarding home IV abx needs- she will reach out to daughter Yesenia Payne for home abx education needs.  Call made to [facility] for [facility] referral- pending return call.   Pt will need to transport home via [facility] once DME has been delivered to home.    Expected Discharge Plan: [place] Barriers to Discharge:  (patient currently refusing SNF)  Expected Discharge Plan and Services Expected Discharge Plan: [place] In-house Referral: Clinical Social Work Discharge Planning Services: CM Consult Post Acute  Care Choice: Durable Medical Equipment, Home Health Living arrangements for the past 2 months: Single Family Home                 DME Arranged: Hospital bed, Lightweight manual wheelchair with seat cushion, Overbed table, 3-N-1, Other see comment (hoyer lift) DME Agency: [facility]       HH Arranged: PT, OT           Social Determinants of Health (SDOH) Interventions    Readmission Risk Interventions Readmission Risk Prevention Plan 10/10/2020  Transportation Screening Complete  Some recent data might be hidden

## 2020-10-10 NOTE — Progress Notes (Signed)
Mobility Specialist: Progress Note   10/10/20 1430  Mobility  Activity  (Bed Exercises)  Level of Assistance Minimal assist, patient does 75% or more  Assistive Device None  Mobility Response Tolerated well  Mobility performed by Mobility specialist  Bed Position Semi-fowlers  $Mobility charge 1 Mobility   Pre-Mobility: 73 HR, 157/73 BP, 95% SpO2 Post-Mobility: 76 HR, 167/71 BP, 97% SpO2  Pt performed hip and knee flexion and extension exercises in the bed. Pt also performed seated row exercise for UE strengthening via hand held assist. Pt very motivated to continue mobility. Will f/u tomorrow.   [facility] Nykeem Citro Mobility Specialist

## 2020-10-10 NOTE — Progress Notes (Signed)
Notes from last evening.  Patient is lying comfortably this morning.  She does state after she moves around she does get significant pain.  Wound VAC is still working 0 cc in canister.  Plan would be to remove it when discharged to home or skilled nursing.  With regards to pain control Dr. Lajoyce Corners does not want to give scheduled Dilaudid or Percocet.  He does think she might do well with a small amount of OxyContin and Percocet for breakthrough.  Discussed with the patient and she is willing to try this

## 2020-10-10 NOTE — Consult Note (Signed)
WOC Nurse Consult Note: Patient receiving care in [facility] 4E15. Reason for Consult: sacral evaluation and treatment recommendations Wound type: unstageable to bilateral buttocks/ischial areas Pressure Injury POA: Yes Measurement: Entire area measures 5.5 cm x 10 cm x unknown depth Wound bed: 75% tan and yellow slough  Drainage (amount, consistency, odor) The area is very moist; the patient is incontinent of urine and stool Periwound: Consistent with chronic tissue injury and moisture damage Dressing procedure/placement/frequency:  Apply Santyl to bilateral buttocks wounds in a nickel thick layer. Cover with a saline moistened gauze, then dry gauze or ABD pad.  Change daily.  Monitor the wound area(s) for worsening of condition such as: Signs/symptoms of infection,  Increase in size,  Development of or worsening of odor, Development of pain, or increased pain at the affected locations.  Notify the medical team if any of these develop.  Thank you for the consult.  Discussed plan of care with the patient.  WOC nurse will not follow at this time.  Please re-consult the WOC team if needed.  Helmut Muster, RN, MSN, CWOCN, CNS-BC, pager [phone]

## 2020-10-11 ENCOUNTER — Inpatient Hospital Stay: Payer: Self-pay

## 2020-10-11 DIAGNOSIS — L02612 Cutaneous abscess of left foot: Secondary | ICD-10-CM | POA: Diagnosis not present

## 2020-10-11 DIAGNOSIS — A419 Sepsis, unspecified organism: Secondary | ICD-10-CM | POA: Diagnosis not present

## 2020-10-11 DIAGNOSIS — I4891 Unspecified atrial fibrillation: Secondary | ICD-10-CM | POA: Diagnosis not present

## 2020-10-11 LAB — CBC WITH DIFFERENTIAL/PLATELET
Abs Immature Granulocytes: 0.47 10*3/uL — ABNORMAL HIGH (ref 0.00–0.07)
Basophils Absolute: 0.1 10*3/uL (ref 0.0–0.1)
Basophils Relative: 1 %
Eosinophils Absolute: 0.3 10*3/uL (ref 0.0–0.5)
Eosinophils Relative: 3 %
HCT: 35.4 % — ABNORMAL LOW (ref 36.0–46.0)
Hemoglobin: 10.7 g/dL — ABNORMAL LOW (ref 12.0–15.0)
Immature Granulocytes: 4 %
Lymphocytes Relative: 13 %
Lymphs Abs: 1.5 10*3/uL (ref 0.7–4.0)
MCH: 27.4 pg (ref 26.0–34.0)
MCHC: 30.2 g/dL (ref 30.0–36.0)
MCV: 90.5 fL (ref 80.0–100.0)
Monocytes Absolute: 0.5 10*3/uL (ref 0.1–1.0)
Monocytes Relative: 4 %
Neutro Abs: 8.7 10*3/uL — ABNORMAL HIGH (ref 1.7–7.7)
Neutrophils Relative %: 75 %
Platelets: 316 10*3/uL (ref 150–400)
RBC: 3.91 MIL/uL (ref 3.87–5.11)
RDW: 15.9 % — ABNORMAL HIGH (ref 11.5–15.5)
WBC: 11.7 10*3/uL — ABNORMAL HIGH (ref 4.0–10.5)
nRBC: 0 % (ref 0.0–0.2)

## 2020-10-11 LAB — COMPREHENSIVE METABOLIC PANEL
ALT: 17 U/L (ref 0–44)
AST: 17 U/L (ref 15–41)
Albumin: 1.5 g/dL — ABNORMAL LOW (ref 3.5–5.0)
Alkaline Phosphatase: 104 U/L (ref 38–126)
Anion gap: 9 (ref 5–15)
BUN: 11 mg/dL (ref 8–23)
CO2: 26 mmol/L (ref 22–32)
Calcium: 8.2 mg/dL — ABNORMAL LOW (ref 8.9–10.3)
Chloride: 106 mmol/L (ref 98–111)
Creatinine, Ser: 0.79 mg/dL (ref 0.44–1.00)
GFR, Estimated: >60 mL/min (ref >60)
Glucose, Bld: 138 mg/dL — ABNORMAL HIGH (ref 70–99)
Potassium: 4.8 mmol/L (ref 3.5–5.1)
Sodium: 141 mmol/L (ref 135–145)
Total Bilirubin: 0.5 mg/dL (ref 0.3–1.2)
Total Protein: 6.1 g/dL — ABNORMAL LOW (ref 6.5–8.1)

## 2020-10-11 LAB — GLUCOSE, CAPILLARY
Glucose-Capillary: 106 mg/dL — ABNORMAL HIGH (ref 70–99)
Glucose-Capillary: 177 mg/dL — ABNORMAL HIGH (ref 70–99)
Glucose-Capillary: 121 mg/dL — ABNORMAL HIGH (ref 70–99)
Glucose-Capillary: 106 mg/dL — ABNORMAL HIGH (ref 70–99)

## 2020-10-11 LAB — PHOSPHORUS: Phosphorus: 3.9 mg/dL (ref 2.5–4.6)

## 2020-10-11 LAB — OCCULT BLOOD X 1 CARD TO LAB, STOOL: Fecal Occult Bld: NEGATIVE

## 2020-10-11 LAB — MAGNESIUM: Magnesium: 2 mg/dL (ref 1.7–2.4)

## 2020-10-11 MED ORDER — OXYCODONE-ACETAMINOPHEN 5-325 MG PO TABS: 1.0000 | ORAL_TABLET | ORAL | 0 refills | Status: AC | PRN

## 2020-10-11 MED ORDER — XTAMPZA ER 9 MG PO C12A: 9.0000 mg | EXTENDED_RELEASE_CAPSULE | Freq: Two times a day (BID) | ORAL | 0 refills | Status: DC | PRN

## 2020-10-11 NOTE — Care Management (Signed)
    Durable Medical Equipment  (From admission, onward)         Start     Ordered   10/11/20 1135  For home use only DME 3 n 1  Once       Comments: Bariatric, weight 306 pounds   10/11/20 1135   10/11/20 1134  For home use only DME standard manual wheelchair with seat cushion  Once       Comments: Patient suffers from weakness which impairs their ability to perform daily activities like walking in the home.  A walker will not resolve issue with performing activities of daily living. A wheelchair will allow patient to safely perform daily activities. Patient can safely propel the wheelchair in the home or has a caregiver who can provide assistance. Length of need lifetime. Accessories: elevating leg rests (ELRs), wheel locks, extensions and anti-tippers.  Bariatric, weight 306 pounds   10/11/20 1135   10/11/20 1132  For home use only DME Hospital bed  Once       Comments: Bariatric bed for patient, weight 306 pounds  Question Answer Comment  Length of Need Lifetime   Patient has (list medical condition): multiple pressure injuries   The above medical condition requires: Patient requires the ability to reposition frequently   Bed type Semi-electric   Hoyer Lift Yes   Support Surface: Low Air loss Mattress      10/11/20 1135   10/11/20 1132  For home use only DME Other see comment  Once       Comments: Bed side table for bariatric bed, weight 306 pounds  Question:  Length of Need  Answer:  Lifetime   10/11/20 1135

## 2020-10-11 NOTE — Progress Notes (Signed)
Mobility Specialist: Progress Note   10/11/20 1409  Mobility  Activity Dangled on edge of bed  Level of Assistance +2 (takes two people)  Mobility Response Tolerated well  Mobility performed by Mobility specialist;Nurse  Bed Position High-fowlers  $Mobility charge 1 Mobility   Pre-Mobility: 73 HR, 152/70 BP, 96% SpO2 Post-Mobility: 78 HR, 156/79 BP, 95% SpO2  Pt sat on EOB with assistance from RN and myself. Pt said she feels more confident moving now after sitting EOB. Pt c/o pain she rated 3/10 during transfer and while sitting EOB. Will f/u tomorrow.   [facility] Herchel Hopkin Mobility Specialist

## 2020-10-11 NOTE — Progress Notes (Signed)
PICC order received.  Per CM/SW notes, pt bed will not be ready until Monday if available. Pt requests to wait until Sunday or Monday to place PICC line per RN.  Dr Joseph Art aware. Per RN, pt currently has PIV working well.

## 2020-10-11 NOTE — TOC Progression Note (Addendum)
Transition of Care [facility]) - Progression Note    Patient Details  Name: Yesenia Payne MRN: [id number] Date of Birth: 1951-06-24  Transition [facility] Care [facility]) CM/SW Contact  Lawerance Sabal, RN Phone Number: 10/11/2020, 11:46 AM  Clinical Narrative:   Per Pearson Grippe, they are able to accept for [facility] services. Pam w [facility] following OPAT order. DME  Bariatric hospital bed, 3/1, WC, hoyer, bedside table ordered through [facility] to be delivered to home either Saturday night or Monday. Spoke w daughter Efraim Kaufmann and updated.    13:00 Notified by [facility] that they do not have any bariatric hospital beds, but will process the rest of the DME.   [facility] does not have any Bariatric beds . [facility] does not have hospital beds.  [facility]- closed   [facility]- closed  Referral made to [facility] [phone]) who took information and will return call Jan Fireman RN CM on Monday to notify if they can deliver.     Expected Discharge Plan: Home w Home Health Services Barriers to Discharge: Continued Medical Work up  Expected Discharge Plan and Services Expected Discharge Plan: Home w Home Health Services In-house Referral: Clinical Social Work Discharge Planning Services: CM Consult Post Acute Care Choice: Durable Medical Equipment, Home Health Living arrangements for the past 2 months: Single Family Home                 DME Arranged: 3-N-1, Wheelchair manual, Hospital bed, Other see comment DME Agency: [facility] Date DME Agency Contacted: 10/11/20 Time DME Agency Contacted: 1146 Representative spoke with at DME Agency: Arnold Long HH Arranged: PT, OT HH Agency: [facility] Date HH Agency Contacted: 10/11/20 Time HH Agency Contacted: 1146 Representative spoke with at [facility] Agency: Pearson Grippe   Social Determinants of Health (SDOH) Interventions    Readmission Risk Interventions Readmission Risk Prevention Plan 10/10/2020  Transportation Screening Complete  Some  recent data might be hidden

## 2020-10-11 NOTE — Progress Notes (Signed)
PROGRESS NOTE    Yesenia Payne  WPY:[id number] DOB: 1951-07-10 DOA: 10/04/2020 PCP: Carol Ada, MD     Brief Narrative:  69 y.o.WF PMHx HTN, HLD, DM type II   Presents to the ED for chief concern of left lower leg wound that started about two days ago.  She reports she only noticed the wound 2 days ago, although family states that it had been present for a couple of weeks. She presented to the emergency department because the wound burst today.  She states the leg has been red and swollen for about 1-2 weeks. She denies presence of wounds to her knowledgeprior to twodays ago. She denies trauma to the lower extremity.  ROS was negative for headache, vision changes, dysphagia, odynophagia, cough, chest pain, shortness of breath, abdominal pain, dysuria, hematuria, diarrhea, blood in her stool, constipation.  X-ray of the left lower extremity demonstrate gas in the dorsal and plantar aspects of the foot and heel. Started on IV Ancef and Clindamycin. She was transferred to [facility]. Dr. Sharol Given has been consulted. He has evaluated the patient.  S/P transtibial amputation on the morning of 10/06/2020.  The patient has had 2/2 blood cultures grow out Strep species and p. Mirabilis. She had been receiving cefazolin and vancomycin from admission. Echocardiogram was ordered and did not demonstrate vegetations. Repeat blood cultures have been ordered. Infectious disease was consulted. Dr. Juleen China narrowed her antibiotics to Ceftriaxone 2 gm IV daily.   Subjective: 12/4 A/O x4, patient in good mood today.  Did some exercising with PT today.  Not as fatigued.   Assessment & Plan: Covid vaccination;   Active Problems:   Necrotizing cellulitis   Sepsis ([place])   Cutaneous abscess of left foot   Anemia   Pressure injury of skin   Morbidly obese (HCC)  Necrotizing cellulitis/cutaneous abscess LEFT foot -S/p 11/29 transtibial amputation see results  below -Per Dr. Sharol Given to orthopedic surgery care plan is as follows;  Weightbearing: NWB left  Pain medication: opoid pathway  Dressing care/ Wound KNL:[id number] for 1 week  Discharge to: SNF -Antibiotics per ID  Bacteremia positive Proteus Mirabella's/Strep species/Bacteroides fragilis/Enterobacterales -Continue antibiotics. 10 Days post surgery date. per Dr. Jule Ser ID. . -12/3 per ID once blood cultures negative for 48 hours PICC line may be placed  Septic shock -On admission patient met criteria for septic shock temp> 38 C, HR> 90, RR> 20, lactic acid> 2 site of infection left lower leg -on admission seen by PCCM secondary to concern for need of vasopressors. -Resolved  DM type II uncontrolled with hyperglycemia -11/27 hemoglobin A1c= 7.2 -Lantus 50 units daily -Resistant SSI  Anemia unspecified -Occult blood pending -Anemia panel pending  HTN -Controlled without medication continue to monitor closely  Orbitally obese (BMI 53.95 kg/m) -After patient recovers would benefit possibly from bariatric surgery  Multiple pressure injuries:  1-2. Bruising noted over the posterior left thigh and left calf unclear etiology  3. Deep tissue &pressure injury right ischial tuberosity; 100% dark purple non blanchable tissue  4. Unstageable pressure injury sacrum; 50% pink/50% black  Pressure Injury POA: Yes  Measurement:  Right ischium: 11cm x 6cm x 0cm  Sacrum: 9cm x 5cm x 0.2cm" Pressure Injury 10/05/20 Stage 2 -  Partial thickness loss of dermis presenting as a shallow open injury with a red, pink wound bed without slough. multiple wounds on buttocks (Active)  10/05/20 0915  Location:   Location Orientation:   Staging: Stage 2 -  Partial  thickness loss of dermis presenting as a shallow open injury with a red, pink wound bed without slough.  Wound Description (Comments): multiple wounds on buttocks  Present on Admission: Yes   Goals of care -12/2 discussed with patient  and daughter per Dr. Jess Barters note he recommended SNF for patient however patient and daughter refusing SNF at this time.  I stated they needed to discuss this with Dr. Sharol Given.    I have seen and examined this patient myself. I have spent 38 minutes in her evaluation and care. More than 50% of this was spent in coordination of care.   DVT prophylaxis:  Code Status: Full Family Communication: 12/1 daughter at bedside discussed plan of care answered all questions Status is: Inpatient    Dispo: The patient is from: Home              Anticipated d/c is to: SNF              Anticipated d/c date is: Per surgery              Patient currently unstable      Consultants:  ID Orthopedic surgery   Procedures/Significant Events:  Xray Left Foot: Superficial ulceration in the plantar soft tissues over the calcaneus. Soft tissue gas over the plantar and dorsal aspects of the foot consistent with cellulitis due to gas-forming organism. No evidence of osteomyelitis.  11/29 LeftTranstibial amputation Application of Prevena wound VAC   I have personally reviewed and interpreted all radiology studies and my findings are as above.  VENTILATOR SETTINGS:    Cultures 10/04/20 blood RIGHT AC positive Proteus Mirabella's/Strep species/Bacteroides fragilis/Enterobacterales  11/27 blood LEFT hand positive Proteus Mirabella's, Streptococcus Constellatus 11/27 wound positive Proteus Mirabilis, Staphylococcus Pseudintermedius  10/07/20 blood RIGHT AC NGTD x2 10/05/20 urine culture:   Antimicrobials: Anti-infectives (From admission, onward)   Start     Ordered Stop   10/09/20 1400  metroNIDAZOLE (FLAGYL) tablet 500 mg        10/09/20 1303     10/07/20 1645  cefTRIAXone (ROCEPHIN) 2 g in sodium chloride 0.9 % 100 mL IVPB        10/07/20 1552     10/06/20 0600  ceFAZolin (ANCEF) 3 g in dextrose 5 % 50 mL IVPB  Status:  Discontinued        10/05/20 1040 10/06/20 1142   10/05/20 1200  vancomycin  (VANCOCIN) IVPB 1000 mg/200 mL premix  Status:  Discontinued        10/05/20 0022 10/07/20 1552   10/05/20 1115  ceFAZolin (ANCEF) 3 g in dextrose 5 % 50 mL IVPB  Status:  Discontinued        10/05/20 1017 10/05/20 1040   10/05/20 0800  clindamycin (CLEOCIN) IVPB 600 mg  Status:  Discontinued        10/04/20 2359 10/07/20 1552   10/05/20 0600  piperacillin-tazobactam (ZOSYN) IVPB 3.375 g  Status:  Discontinued        10/05/20 0004 10/07/20 1552   10/05/20 0000  piperacillin-tazobactam (ZOSYN) IVPB 3.375 g  Status:  Discontinued        10/04/20 2359 10/05/20 0003   10/04/20 2300  piperacillin-tazobactam (ZOSYN) IVPB 3.375 g        10/04/20 2246 10/04/20 2327   10/04/20 2300  vancomycin (VANCOREADY) IVPB 2000 mg/400 mL        10/04/20 2246 10/05/20 0231   10/04/20 2300  clindamycin (CLEOCIN) IVPB 600 mg  10/04/20 2249 10/05/20 0006       Devices    LINES / TUBES:  Wound VAC 7/29>>    Continuous Infusions: . sodium chloride Stopped (10/07/20 1903)  . cefTRIAXone (ROCEPHIN)  IV 2 g (10/10/20 1745)  . methocarbamol (ROBAXIN) IV       Objective: Vitals:   10/11/20 0325 10/11/20 0400 10/11/20 0800 10/11/20 0824  BP: (!) 141/80 131/67 131/81   Pulse: 66 69 68   Resp: _0 Temp: 98.1 F (36.7 C) 98.2 F (36.8 C) 98.1 F (36.7 C)   TempSrc: Oral Oral Oral   SpO2: 94% 95% 94% 97%  Weight:      Height:        Intake/Output Summary (Last 24 hours) at 10/11/2020 2248 Last data filed at 10/11/2020 0601 Gross per 24 hour  Intake 580 ml  Output 700 ml  Net -120 ml   Filed Weights   10/04/20 2136 10/07/20 0552 10/08/20 0500  Weight: 124.7 kg (!) 142 kg (!) 139.3 kg    Examination:  General: A/O x4, No acute respiratory distress Eyes: negative scleral hemorrhage, negative anisocoria, negative icterus ENT: Negative Runny nose, negative gingival bleeding, Neck:  Negative scars, masses, torticollis, lymphadenopathy, JVD Lungs: Clear to auscultation bilaterally  without wheezes or crackles Cardiovascular: Regular rate and rhythm without murmur gallop or rub normal S1 and S2 Abdomen: MORBIDLY OBESE, negative abdominal pain, nondistended, positive soft, bowel sounds, no rebound, no ascites, no appreciable mass EXTREMITIES: left AKA with wound VAC in place Skin: Negative rashes, lesions, ulcers Psychiatric:  Negative depression, negative anxiety, negative fatigue, negative mania  Central nervous system:  Cranial nerves II through XII intact, tongue/uvula midline, all extremities muscle strength 5/5, sensation intact throughout, negative dysarthria, negative expressive aphasia, negative receptive aphasia.  .     Data Reviewed: Care during the described time interval was provided by me .  I have reviewed this patient's available data, including medical history, events of note, physical examination, and all test results as part of my evaluation.  CBC: Recent Labs  Lab 10/07/20 0139 10/08/20 1014 10/09/20 0252 10/10/20 0213 10/11/20 0137  WBC 18.6* 13.1* 11.1* 12.1* 11.7*  NEUTROABS 14.6* 10.5* 7.8* 9.4* 8.7*  HGB 10.8* 10.6* 9.8* 10.8* 10.7*  HCT 33.6* 33.0* 32.0* 33.3* 35.4*  MCV 85.9 86.2 90.7 87.2 90.5  PLT 342 293 258 243 250   Basic Metabolic Panel: Recent Labs  Lab 10/07/20 0139 10/07/20 0139 10/07/20 0720 10/08/20 0121 10/08/20 1014 10/09/20 0252 10/10/20 0507 10/11/20 0137  NA 138  --   --   --  137 135 138 141  K 5.9*   < > 3.8  --  4.3 4.1 4.3 4.8  CL 105  --   --   --  104 104 105 106  CO2 19*  --   --   --  _1 GLUCOSE 168*  --   --   --  97 179* 165* 138*  BUN 17  --   --   --  _2 CREATININE 0.97   < >  --  0.89 0.80 0.82 0.77 0.79  CALCIUM 7.9*  --   --   --  8.0* 7.5* 7.9* 8.2*  MG  --   --   --   --  1.9 1.9 2.0 2.0  PHOS  --   --   --   --  3.4 3.8 3.8 3.9   < > =  values in this interval not displayed.   GFR: Estimated Creatinine Clearance: 90.4 mL/min (by C-G formula based on SCr of 0.79  mg/dL). Liver Function Tests: Recent Labs  Lab 10/07/20 0139 10/08/20 1014 10/09/20 0252 10/10/20 0507 10/11/20 0137  AST 41 _0 ALT _1 ALKPHOS 142* 143* 111 110 104  BILITOT 1.3* 0.6 0.6 0.5 0.5  PROT 6.5 6.7 5.6* 5.9* 6.1*  ALBUMIN 1.5* 1.5* 1.4* 1.4* 1.5*   No results for input(s): LIPASE, AMYLASE in the last 168 hours. No results for input(s): AMMONIA in the last 168 hours. Coagulation Profile: Recent Labs  Lab 10/04/20 2220  INR 1.3*   Cardiac Enzymes: No results for input(s): CKTOTAL, CKMB, CKMBINDEX, TROPONINI in the last 168 hours. BNP (last 3 results) No results for input(s): PROBNP in the last 8760 hours. HbA1C: No results for input(s): HGBA1C in the last 72 hours. CBG: Recent Labs  Lab 10/10/20 0612 10/10/20 1227 10/10/20 1651 10/10/20 2137 10/11/20 0605  GLUCAP 145* 130* 107* 137* 106*   Lipid Profile: No results for input(s): CHOL, HDL, LDLCALC, TRIG, CHOLHDL, LDLDIRECT in the last 72 hours. Thyroid Function Tests: No results for input(s): TSH, T4TOTAL, FREET4, T3FREE, THYROIDAB in the last 72 hours. Anemia Panel: Recent Labs    10/09/20 0840  VITAMINB12 690  FOLATE 9.8  FERRITIN 227  TIBC 183*  IRON 39  RETICCTPCT 1.9   Sepsis Labs: Recent Labs  Lab 10/04/20 2220 10/05/20 0016  LATICACIDVEN 2.6* 2.2*    Recent Results (from the past 240 hour(s))  Urine culture     Status: Abnormal   Collection Time: 10/04/20 10:20 PM   Specimen: In/Out Cath Urine  Result Value Ref Range Status   Specimen Description   Final    IN/OUT CATH URINE Performed at [facility], [place] [address], [place], [place] [address]    Special Requests   Final    NONE Performed at [facility], [place] [address] [place], [place] [address]    Culture MULTIPLE SPECIES PRESENT, SUGGEST RECOLLECTION (A)  Final   Report Status 10/06/2020 FINAL  Final  Blood Culture (routine x 2)     Status: Abnormal    Collection Time: 10/04/20 10:20 PM   Specimen: BLOOD  Result Value Ref Range Status   Specimen Description   Final    BLOOD RIGHT ANTECUBITAL Performed at [place] [address], [place], [place] [address]    Special Requests   Final    BOTTLES DRAWN AEROBIC AND ANAEROBIC Blood Culture results may not be optimal due to an inadequate volume of blood received in culture bottles Performed at [place] [address], [place], [place] [address]    Culture  Setup Time   Final    GRAM NEGATIVE RODS ANAEROBIC BOTTLE ONLY GRAM POSITIVE COCCI IN PAIRS CRITICAL RESULT CALLED TO, READ BACK BY AND VERIFIED WITH: PHARMD J MILLEN [id number] AT 1544 BY CM GRAM POSITIVE COCCI AEROBIC BOTTLE ONLY    Culture (A)  Final    PROTEUS MIRABILIS STREPTOCOCCUS CONSTELLATUS BACTEROIDES FRAGILIS BETA LACTAMASE POSITIVE Performed at [facility], [place] [address], [place], [place] [address]    Report Status 10/09/2020 FINAL  Final   Organism ID, Bacteria PROTEUS MIRABILIS  Final   Organism ID, Bacteria STREPTOCOCCUS CONSTELLATUS  Final      Susceptibility   Streptococcus constellatus - MIC*    PENICILLIN 0.25 INTERMEDIATE Intermediate  CEFTRIAXONE 1 SENSITIVE Sensitive     ERYTHROMYCIN <=0.12 SENSITIVE Sensitive     LEVOFLOXACIN 0.5 SENSITIVE Sensitive     VANCOMYCIN 0.5 SENSITIVE Sensitive     * STREPTOCOCCUS CONSTELLATUS   Proteus mirabilis - MIC*    AMPICILLIN >=32 RESISTANT Resistant     CEFAZOLIN 8 SENSITIVE Sensitive     CEFEPIME <=0.12 SENSITIVE Sensitive     CEFTAZIDIME <=1 SENSITIVE Sensitive     CEFTRIAXONE <=0.25 SENSITIVE Sensitive     CIPROFLOXACIN 2 INTERMEDIATE Intermediate     GENTAMICIN <=1 SENSITIVE Sensitive     IMIPENEM 2 SENSITIVE Sensitive     TRIMETH/SULFA >=320 RESISTANT Resistant     AMPICILLIN/SULBACTAM 8 SENSITIVE Sensitive     PIP/TAZO <=4 SENSITIVE Sensitive     * PROTEUS MIRABILIS  Blood Culture ID Panel (Reflexed)      Status: Abnormal   Collection Time: 10/04/20 10:20 PM  Result Value Ref Range Status   Enterococcus faecalis NOT DETECTED NOT DETECTED Final   Enterococcus Faecium NOT DETECTED NOT DETECTED Final   Listeria monocytogenes NOT DETECTED NOT DETECTED Final   Staphylococcus species NOT DETECTED NOT DETECTED Final   Staphylococcus aureus (BCID) NOT DETECTED NOT DETECTED Final   Staphylococcus epidermidis NOT DETECTED NOT DETECTED Final   Staphylococcus lugdunensis NOT DETECTED NOT DETECTED Final   Streptococcus species DETECTED (A) NOT DETECTED Final    Comment: Not Enterococcus species, Streptococcus agalactiae, Streptococcus pyogenes, or Streptococcus pneumoniae. CRITICAL RESULT CALLED TO, READ BACK BY AND VERIFIED WITH: PHARMD J MILLEN [id number] ST 1545 BY CM    Streptococcus agalactiae NOT DETECTED NOT DETECTED Final   Streptococcus pneumoniae NOT DETECTED NOT DETECTED Final   Streptococcus pyogenes NOT DETECTED NOT DETECTED Final   A.calcoaceticus-baumannii NOT DETECTED NOT DETECTED Final   Bacteroides fragilis DETECTED (A) NOT DETECTED Final    Comment: CRITICAL RESULT CALLED TO, READ BACK BY AND VERIFIED WITH: PHARMD J MILLEN [id number] AT 1545 BY CM    Enterobacterales DETECTED (A) NOT DETECTED Final    Comment: Enterobacterales represent a large order of gram negative bacteria, not a single organism. CRITICAL RESULT CALLED TO, READ BACK BY AND VERIFIED WITH: PHARMD J MILLEN [id number] AT 1545 BY CM    Enterobacter cloacae complex NOT DETECTED NOT DETECTED Final   Escherichia coli NOT DETECTED NOT DETECTED Final   Klebsiella aerogenes NOT DETECTED NOT DETECTED Final   Klebsiella oxytoca NOT DETECTED NOT DETECTED Final   Klebsiella pneumoniae NOT DETECTED NOT DETECTED Final   Proteus species DETECTED (A) NOT DETECTED Final    Comment: CRITICAL RESULT CALLED TO, READ BACK BY AND VERIFIED WITH: PHARMD J MILLEN [id number] AT 1545 BY CM    Salmonella species NOT DETECTED NOT DETECTED Final    Serratia marcescens NOT DETECTED NOT DETECTED Final   Haemophilus influenzae NOT DETECTED NOT DETECTED Final   Neisseria meningitidis NOT DETECTED NOT DETECTED Final   Pseudomonas aeruginosa NOT DETECTED NOT DETECTED Final   Stenotrophomonas maltophilia NOT DETECTED NOT DETECTED Final   Candida albicans NOT DETECTED NOT DETECTED Final   Candida auris NOT DETECTED NOT DETECTED Final   Candida glabrata NOT DETECTED NOT DETECTED Final   Candida krusei NOT DETECTED NOT DETECTED Final   Candida parapsilosis NOT DETECTED NOT DETECTED Final   Candida tropicalis NOT DETECTED NOT DETECTED Final   Cryptococcus neoformans/gattii NOT DETECTED NOT DETECTED Final   CTX-M ESBL NOT DETECTED NOT DETECTED Final   Carbapenem resistance IMP NOT DETECTED NOT DETECTED Final   Carbapenem resistance KPC NOT DETECTED  NOT DETECTED Final   Carbapenem resistance NDM NOT DETECTED NOT DETECTED Final   Carbapenem resist OXA 48 LIKE NOT DETECTED NOT DETECTED Final   Carbapenem resistance VIM NOT DETECTED NOT DETECTED Final    Comment: Performed at [facility], [place] [address], [place], [place] [address]  Blood Culture (routine x 2)     Status: Abnormal   Collection Time: 10/04/20 10:21 PM   Specimen: BLOOD LEFT HAND  Result Value Ref Range Status   Specimen Description   Final    BLOOD LEFT HAND Performed at [place] [address], [place], [place] [address]    Special Requests   Final    BOTTLES DRAWN AEROBIC AND ANAEROBIC Blood Culture adequate volume Performed at [place] [name] [place], [place] [address]    Culture  Setup Time   Final    GRAM NEGATIVE RODS GRAM POSITIVE COCCI IN CHAINS ANAEROBIC BOTTLE ONLY CRITICAL VALUE NOTED.  VALUE IS CONSISTENT WITH PREVIOUSLY REPORTED AND CALLED VALUE. GRAM POSITIVE COCCI IN CHAINS AEROBIC BOTTLE ONLY    Culture (A)  Final    PROTEUS MIRABILIS STREPTOCOCCUS CONSTELLATUS SUSCEPTIBILITIES PERFORMED ON  PREVIOUS CULTURE WITHIN THE LAST 5 DAYS. Performed at [facility], [place] [address] [place], [place] [address]    Report Status 10/08/2020 FINAL  Final  Wound or Superficial Culture     Status: None   Collection Time: 10/04/20 10:30 PM   Specimen: Wound  Result Value Ref Range Status   Specimen Description   Final    WOUND Performed at [place] [address], [place], [place] [address]    Special Requests   Final    LEFT FOOT Performed at [facility], [place] [address], [place],  [address]    Gram Stain   Final    NO WBC SEEN MODERATE GRAM POSITIVE COCCI IN PAIRS MODERATE GRAM NEGATIVE RODS Performed at [facility], [place] [address] [place],  [address]    Culture   Final    ABUNDANT PROTEUS MIRABILIS FEW STAPHYLOCOCCUS PSEUDINTERMEDIUS    Report Status 10/10/2020 FINAL  Final   Organism ID, Bacteria PROTEUS MIRABILIS  Final   Organism ID, Bacteria STAPHYLOCOCCUS PSEUDINTERMEDIUS  Final      Susceptibility   Proteus mirabilis - MIC*    AMPICILLIN >=32 RESISTANT Resistant     CEFAZOLIN 8 SENSITIVE Sensitive     CEFEPIME <=0.12 SENSITIVE Sensitive     CEFTAZIDIME <=1 SENSITIVE Sensitive     CEFTRIAXONE <=0.25 SENSITIVE Sensitive     CIPROFLOXACIN 2 INTERMEDIATE Intermediate     GENTAMICIN <=1 SENSITIVE Sensitive     IMIPENEM 2 SENSITIVE Sensitive     TRIMETH/SULFA >=320 RESISTANT Resistant     AMPICILLIN/SULBACTAM 4 SENSITIVE Sensitive     PIP/TAZO <=4 SENSITIVE Sensitive     * ABUNDANT PROTEUS MIRABILIS   Staphylococcus pseudintermedius - MIC*    CIPROFLOXACIN <=0.5 SENSITIVE Sensitive     ERYTHROMYCIN <=0.25 SENSITIVE Sensitive     GENTAMICIN <=0.5 SENSITIVE Sensitive     OXACILLIN <=0.25 SENSITIVE Sensitive     TETRACYCLINE <=1 SENSITIVE Sensitive     VANCOMYCIN <=0.5 SENSITIVE Sensitive     TRIMETH/SULFA <=10 SENSITIVE Sensitive     CLINDAMYCIN <=0.25 SENSITIVE Sensitive     RIFAMPIN <=0.5  SENSITIVE Sensitive     Inducible Clindamycin NEGATIVE Sensitive     * FEW STAPHYLOCOCCUS PSEUDINTERMEDIUS  Resp Panel by RT-PCR (Flu A&B,  Covid) Foot, Left     Status: None   Collection Time: 10/04/20 11:06 PM   Specimen: Foot, Left; Nasopharyngeal(NP) swabs in vial transport medium  Result Value Ref Range Status   SARS Coronavirus 2 by RT PCR NEGATIVE NEGATIVE Final    Comment: (NOTE) SARS-CoV-2 target nucleic acids are NOT DETECTED.  The SARS-CoV-2 RNA is generally detectable in upper respiratory specimens during the acute phase of infection. The lowest concentration of SARS-CoV-2 viral copies this assay can detect is 138 copies/mL. A negative result does not preclude SARS-Cov-2 infection and should not be used as the sole basis for treatment or other patient management decisions. A negative result may occur with  improper specimen collection/handling, submission of specimen other than nasopharyngeal swab, presence of viral mutation(s) within the areas targeted by this assay, and inadequate number of viral copies(<138 copies/mL). A negative result must be combined with clinical observations, patient history, and epidemiological information. The expected result is Negative.  Fact Sheet for Patients:  EntrepreneurPulse.com.au  Fact Sheet for Healthcare Providers:  IncredibleEmployment.be  This test is no t yet approved or cleared by the [place] FDA and  has been authorized for detection and/or diagnosis of SARS-CoV-2 by FDA under an Emergency Use Authorization (EUA). This EUA will remain  in effect (meaning this test can be used) for the duration of the COVID-19 declaration under Section 564(b)(1) of the Act, 21 U.S.C.section 360bbb-3(b)(1), unless the authorization is terminated  or revoked sooner.       Influenza A by PCR NEGATIVE NEGATIVE Final   Influenza B by PCR NEGATIVE NEGATIVE Final    Comment: (NOTE) The Xpert Xpress  SARS-CoV-2/FLU/RSV plus assay is intended as an aid in the diagnosis of influenza from Nasopharyngeal swab specimens and should not be used as a sole basis for treatment. Nasal washings and aspirates are unacceptable for Xpert Xpress SARS-CoV-2/FLU/RSV testing.  Fact Sheet for Patients: EntrepreneurPulse.com.au  Fact Sheet for Healthcare Providers: IncredibleEmployment.be  This test is not yet approved or cleared by the [place] FDA and has been authorized for detection and/or diagnosis of SARS-CoV-2 by FDA under an Emergency Use Authorization (EUA). This EUA will remain in effect (meaning this test can be used) for the duration of the COVID-19 declaration under Section 564(b)(1) of the Act, 21 U.S.C. section 360bbb-3(b)(1), unless the authorization is terminated or revoked.  Performed at [facility], [place] [address], [place], [place] [address]   Surgical PCR screen     Status: None   Collection Time: 10/05/20  8:54 PM   Specimen: Nasal Mucosa; Nasal Swab  Result Value Ref Range Status   MRSA, PCR NEGATIVE NEGATIVE Final   Staphylococcus aureus NEGATIVE NEGATIVE Final    Comment: (NOTE) The Xpert SA Assay (FDA approved for NASAL specimens in patients 88 years of age and older), is one component of a comprehensive surveillance program. It is not intended to diagnose infection nor to guide or monitor treatment. Performed at [facility], [place] [address], [place], [place] [address]   Culture, blood (routine x 2)     Status: None (Preliminary result)   Collection Time: 10/07/20  3:45 PM   Specimen: BLOOD  Result Value Ref Range Status   Specimen Description BLOOD RIGHT ANTECUBITAL  Final   Special Requests   Final    BOTTLES DRAWN AEROBIC AND ANAEROBIC Blood Culture adequate volume   Culture   Final    NO GROWTH 3 DAYS Performed at [facility], [address], [place], [place] [address]    Report Status  PENDING  Incomplete  Culture, blood (routine x 2)     Status: None (Preliminary result)   Collection Time: 10/07/20  3:54 PM   Specimen: BLOOD  Result Value Ref Range Status   Specimen Description BLOOD RIGHT ANTECUBITAL  Final   Special Requests   Final    BOTTLES DRAWN AEROBIC ONLY Blood Culture adequate volume   Culture   Final    NO GROWTH 3 DAYS Performed at [name], [address] [place], [place] [address]    Report Status PENDING  Incomplete         Radiology Studies: No results found.      Scheduled Meds: . albuterol  1 puff Inhalation BID  . collagenase   Topical Daily  . docusate sodium  100 mg Oral BID  . fluticasone furoate-vilanterol  1 puff Inhalation Daily  . gabapentin  300 mg Oral TID  . insulin aspart  0-20 Units Subcutaneous TID WC  . insulin aspart  0-5 Units Subcutaneous QHS  . insulin glargine  50 Units Subcutaneous Daily  . ketorolac  15 mg Intravenous Q6H  . metroNIDAZOLE  500 mg Oral Q8H  . omeprazole  20 mg Oral Daily  . oxyCODONE  10 mg Oral Q12H  . rosuvastatin  20 mg Oral Daily   Continuous Infusions: . sodium chloride Stopped (10/07/20 1903)  . cefTRIAXone (ROCEPHIN)  IV 2 g (10/10/20 1745)  . methocarbamol (ROBAXIN) IV       LOS: 7 days    Time spent:40 min    Elen Acero, Geraldo Docker, MD [facility] Pager [phone]  If 7PM-7AM, please contact night-coverage [email or web address] Password [facility] 10/11/2020, 8:42 AM

## 2020-10-12 ENCOUNTER — Inpatient Hospital Stay (HOSPITAL_COMMUNITY): Payer: Medicare Other

## 2020-10-12 DIAGNOSIS — L02612 Cutaneous abscess of left foot: Secondary | ICD-10-CM | POA: Diagnosis not present

## 2020-10-12 DIAGNOSIS — I4891 Unspecified atrial fibrillation: Secondary | ICD-10-CM | POA: Diagnosis not present

## 2020-10-12 DIAGNOSIS — A419 Sepsis, unspecified organism: Secondary | ICD-10-CM | POA: Diagnosis not present

## 2020-10-12 LAB — PHOSPHORUS: Phosphorus: 3.9 mg/dL (ref 2.5–4.6)

## 2020-10-12 LAB — OCCULT BLOOD X 1 CARD TO LAB, STOOL: Fecal Occult Bld: NEGATIVE

## 2020-10-12 LAB — COMPREHENSIVE METABOLIC PANEL
ALT: 16 U/L (ref 0–44)
AST: 18 U/L (ref 15–41)
Albumin: 1.6 g/dL — ABNORMAL LOW (ref 3.5–5.0)
Alkaline Phosphatase: 86 U/L (ref 38–126)
Anion gap: 8 (ref 5–15)
BUN: 11 mg/dL (ref 8–23)
CO2: 25 mmol/L (ref 22–32)
Calcium: 7.9 mg/dL — ABNORMAL LOW (ref 8.9–10.3)
Chloride: 107 mmol/L (ref 98–111)
Creatinine, Ser: 0.68 mg/dL (ref 0.44–1.00)
GFR, Estimated: >60 mL/min (ref >60)
Glucose, Bld: 124 mg/dL — ABNORMAL HIGH (ref 70–99)
Potassium: 4.3 mmol/L (ref 3.5–5.1)
Sodium: 140 mmol/L (ref 135–145)
Total Bilirubin: 0.5 mg/dL (ref 0.3–1.2)
Total Protein: 6.1 g/dL — ABNORMAL LOW (ref 6.5–8.1)

## 2020-10-12 LAB — CBC WITH DIFFERENTIAL/PLATELET
Abs Immature Granulocytes: 0.3 10*3/uL — ABNORMAL HIGH (ref 0.00–0.07)
Basophils Absolute: 0.1 10*3/uL (ref 0.0–0.1)
Basophils Relative: 1 %
Eosinophils Absolute: 0.2 10*3/uL (ref 0.0–0.5)
Eosinophils Relative: 3 %
HCT: 33.4 % — ABNORMAL LOW (ref 36.0–46.0)
Hemoglobin: 10.7 g/dL — ABNORMAL LOW (ref 12.0–15.0)
Immature Granulocytes: 3 %
Lymphocytes Relative: 14 %
Lymphs Abs: 1.3 10*3/uL (ref 0.7–4.0)
MCH: 28.6 pg (ref 26.0–34.0)
MCHC: 32 g/dL (ref 30.0–36.0)
MCV: 89.3 fL (ref 80.0–100.0)
Monocytes Absolute: 0.5 10*3/uL (ref 0.1–1.0)
Monocytes Relative: 6 %
Neutro Abs: 7.1 10*3/uL (ref 1.7–7.7)
Neutrophils Relative %: 73 %
Platelets: 301 10*3/uL (ref 150–400)
RBC: 3.74 MIL/uL — ABNORMAL LOW (ref 3.87–5.11)
RDW: 16 % — ABNORMAL HIGH (ref 11.5–15.5)
WBC: 9.5 10*3/uL (ref 4.0–10.5)
nRBC: 0 % (ref 0.0–0.2)

## 2020-10-12 LAB — CULTURE, BLOOD (ROUTINE X 2)
Culture: NO GROWTH
Culture: NO GROWTH
Special Requests: ADEQUATE
Special Requests: ADEQUATE

## 2020-10-12 LAB — GLUCOSE, CAPILLARY
Glucose-Capillary: 84 mg/dL (ref 70–99)
Glucose-Capillary: 134 mg/dL — ABNORMAL HIGH (ref 70–99)
Glucose-Capillary: 88 mg/dL (ref 70–99)
Glucose-Capillary: 107 mg/dL — ABNORMAL HIGH (ref 70–99)

## 2020-10-12 LAB — MAGNESIUM: Magnesium: 1.9 mg/dL (ref 1.7–2.4)

## 2020-10-12 MED ORDER — SODIUM CHLORIDE 0.9% FLUSH
10.0000 mL | Freq: Two times a day (BID) | INTRAVENOUS | Status: DC
  Administered 2020-10-12 – 2020-10-13 (×3): 10 mL

## 2020-10-12 MED ORDER — CHLORHEXIDINE GLUCONATE CLOTH 2 % EX PADS
6.0000 | MEDICATED_PAD | Freq: Every day | CUTANEOUS | Status: DC
  Administered 2020-10-13 – 2020-10-14 (×2): 6 via TOPICAL

## 2020-10-12 MED ORDER — SODIUM CHLORIDE 0.9% FLUSH: 10.0000 mL | INTRAVENOUS | Status: DC | PRN

## 2020-10-12 NOTE — Plan of Care (Signed)
Progressing, will continue to monitor.  

## 2020-10-12 NOTE — Progress Notes (Signed)
Mobility Specialist: Progress Note   10/12/20 1332  Mobility  Activity Dangled on edge of bed  Level of Assistance +2 (takes two people)  Assistive Device None  Mobility Response Tolerated well  Mobility performed by Mobility specialist  $Mobility charge 1 Mobility   Pre-Mobility: 74 HR, 155/85 BP, 98% SpO2 Post-Mobility: 72 HR, 154/89 BP, 98% SpO2  Pt dangled on EOB for 3-4 minutes. Pt was able to get her R foot on the floor today and bear a little weight on her R foot. I encouraged pt to continue working with staff and continue bed exercises on her own and/or with family member.   [facility] Ahlaya Ende Mobility Specialist

## 2020-10-12 NOTE — Progress Notes (Signed)
PROGRESS NOTE    Yesenia Payne  FMB:[id number] DOB: 1951-05-17 DOA: 10/04/2020 PCP: Carol Ada, MD     Brief Narrative:  69 y.o.WF PMHx HTN, HLD, DM type II   Presents to the ED for chief concern of left lower leg wound that started about two days ago.  She reports she only noticed the wound 2 days ago, although family states that it had been present for a couple of weeks. She presented to the emergency department because the wound burst today.  She states the leg has been red and swollen for about 1-2 weeks. She denies presence of wounds to her knowledgeprior to twodays ago. She denies trauma to the lower extremity.  ROS was negative for headache, vision changes, dysphagia, odynophagia, cough, chest pain, shortness of breath, abdominal pain, dysuria, hematuria, diarrhea, blood in her stool, constipation.  X-ray of the left lower extremity demonstrate gas in the dorsal and plantar aspects of the foot and heel. Started on IV Ancef and Clindamycin. She was transferred to [facility]. Dr. Sharol Given has been consulted. He has evaluated the patient.  S/P transtibial amputation on the morning of 10/06/2020.  The patient has had 2/2 blood cultures grow out Strep species and p. Mirabilis. She had been receiving cefazolin and vancomycin from admission. Echocardiogram was ordered and did not demonstrate vegetations. Repeat blood cultures have been ordered. Infectious disease was consulted. Dr. Juleen China narrowed her antibiotics to Ceftriaxone 2 gm IV daily.   Subjective: 12/5 afebrile overnight A/O x4, in good mood today.   Assessment & Plan: Covid vaccination;   Active Problems:   Necrotizing cellulitis   Sepsis ([place])   Cutaneous abscess of left foot   Anemia   Pressure injury of skin   Morbidly obese (HCC)  Necrotizing cellulitis/cutaneous abscess LEFT foot -S/p 11/29 transtibial amputation see results below -Per Dr. Sharol Given to orthopedic surgery care  plan is as follows;  Weightbearing: NWB left  Pain medication: opoid pathway  Dressing care/ Wound TSV:[id number] for 1 week  Discharge to: SNF -Antibiotics per ID  Bacteremia positive Proteus Mirabella's/Strep species/Bacteroides fragilis/Enterobacterales -Continue antibiotics. 10 Days post surgery date. per Dr. Jule Ser ID. . -12/3 per ID once blood cultures negative for 48 hours PICC line may be placed -12/5 s/p right arm PICC  Septic shock -On admission patient met criteria for septic shock temp> 38 C, HR> 90, RR> 20, lactic acid> 2 site of infection left lower leg -on admission seen by PCCM secondary to concern for need of vasopressors. -Resolved  DM type II uncontrolled with hyperglycemia -11/27 hemoglobin A1c= 7.2 -Lantus 50 units daily -Resistant SSI  Anemia unspecified -Results for CAIRA, POCHE (MRN [id number]) as of 10/12/2020 19:46  Ref. Range 10/10/2020 05:00 10/11/2020 10:14 10/12/2020 01:55  Fecal Occult Blood, POC Latest Ref Range: NEGATIVE  NEGATIVE NEGATIVE NEGATIVE  -Anemia panel pending  HTN -Controlled without medication continue to monitor closely  Orbitally obese (BMI 53.95 kg/m) -After patient recovers would benefit possibly from bariatric surgery  Multiple pressure injuries:  1-2. Bruising noted over the posterior left thigh and left calf unclear etiology  3. Deep tissue &pressure injury right ischial tuberosity; 100% dark purple non blanchable tissue  4. Unstageable pressure injury sacrum; 50% pink/50% black  Pressure Injury POA: Yes  Measurement:  Right ischium: 11cm x 6cm x 0cm  Sacrum: 9cm x 5cm x 0.2cm" Pressure Injury 10/05/20 Stage 2 -  Partial thickness loss of dermis presenting as a shallow open injury with a red, pink  wound bed without slough. multiple wounds on buttocks (Active)  10/05/20 0915  Location:   Location Orientation:   Staging: Stage 2 -  Partial thickness loss of dermis presenting as a shallow open injury with a  red, pink wound bed without slough.  Wound Description (Comments): multiple wounds on buttocks  Present on Admission: Yes   Goals of care -12/2 discussed with patient and daughter per Dr. Jess Barters note he recommended SNF for patient however patient and daughter refusing SNF at this time.  I stated they needed to discuss this with Dr. Sharol Given.    I have seen and examined this patient myself. I have spent 38 minutes in her evaluation and care. More than 50% of this was spent in coordination of care.   DVT prophylaxis:  Code Status: Full Family Communication: 12/1 daughter at bedside discussed plan of care answered all questions Status is: Inpatient    Dispo: The patient is from: Home              Anticipated d/c is to: SNF              Anticipated d/c date is: Per surgery              Patient currently unstable      Consultants:  ID Orthopedic surgery   Procedures/Significant Events:  Xray Left Foot: Superficial ulceration in the plantar soft tissues over the calcaneus. Soft tissue gas over the plantar and dorsal aspects of the foot consistent with cellulitis due to gas-forming organism. No evidence of osteomyelitis. 11/29 LeftTranstibial amputation Application of Prevena wound VAC 12/5 s/p right arm PICC  I have personally reviewed and interpreted all radiology studies and my findings are as above.  VENTILATOR SETTINGS:    Cultures 10/04/20 blood RIGHT AC positive Proteus Mirabella's/Strep species/Bacteroides fragilis/Enterobacterales  11/27 blood LEFT hand positive Proteus Mirabella's, Streptococcus Constellatus 11/27 wound positive Proteus Mirabilis, Staphylococcus Pseudintermedius  10/07/20 blood RIGHT AC NGTD x2 10/05/20 urine culture:   Antimicrobials: Anti-infectives (From admission, onward)   Start     Ordered Stop   10/09/20 1400  metroNIDAZOLE (FLAGYL) tablet 500 mg        10/09/20 1303     10/07/20 1645  cefTRIAXone (ROCEPHIN) 2 g in sodium chloride 0.9  % 100 mL IVPB        10/07/20 1552     10/06/20 0600  ceFAZolin (ANCEF) 3 g in dextrose 5 % 50 mL IVPB  Status:  Discontinued        10/05/20 1040 10/06/20 1142   10/05/20 1200  vancomycin (VANCOCIN) IVPB 1000 mg/200 mL premix  Status:  Discontinued        10/05/20 0022 10/07/20 1552   10/05/20 1115  ceFAZolin (ANCEF) 3 g in dextrose 5 % 50 mL IVPB  Status:  Discontinued        10/05/20 1017 10/05/20 1040   10/05/20 0800  clindamycin (CLEOCIN) IVPB 600 mg  Status:  Discontinued        10/04/20 2359 10/07/20 1552   10/05/20 0600  piperacillin-tazobactam (ZOSYN) IVPB 3.375 g  Status:  Discontinued        10/05/20 0004 10/07/20 1552   10/05/20 0000  piperacillin-tazobactam (ZOSYN) IVPB 3.375 g  Status:  Discontinued        10/04/20 2359 10/05/20 0003   10/04/20 2300  piperacillin-tazobactam (ZOSYN) IVPB 3.375 g        10/04/20 2246 10/04/20 2327   10/04/20 2300  vancomycin (VANCOREADY) IVPB 2000 mg/400  mL        10/04/20 2246 10/05/20 0231   10/04/20 2300  clindamycin (CLEOCIN) IVPB 600 mg        10/04/20 2249 10/05/20 0006       Devices    LINES / TUBES:  Wound VAC 7/29>>    Continuous Infusions: . sodium chloride Stopped (10/07/20 1903)  . cefTRIAXone (ROCEPHIN)  IV 2 g (10/11/20 1535)  . methocarbamol (ROBAXIN) IV       Objective: Vitals:   10/11/20 2000 10/12/20 0000 10/12/20 0359 10/12/20 0813  BP: (!) 152/74 (!) 155/68 (!) 147/64   Pulse: 64 71 75   Resp: _0 Temp:  98 F (36.7 C) 98.2 F (36.8 C)   TempSrc:  Oral Oral   SpO2: 95% 98% 94% 92%  Weight:      Height:        Intake/Output Summary (Last 24 hours) at 10/12/2020 1006 Last data filed at 10/12/2020 0515 Gross per 24 hour  Intake 240 ml  Output 300 ml  Net -60 ml   Filed Weights   10/04/20 2136 10/07/20 0552 10/08/20 0500  Weight: 124.7 kg (!) 142 kg (!) 139.3 kg    Examination:  General: A/O x4, No acute respiratory distress Eyes: negative scleral hemorrhage, negative anisocoria,  negative icterus ENT: Negative Runny nose, negative gingival bleeding, Neck:  Negative scars, masses, torticollis, lymphadenopathy, JVD Lungs: Clear to auscultation bilaterally without wheezes or crackles Cardiovascular: Regular rate and rhythm without murmur gallop or rub normal S1 and S2 Abdomen: MORBIDLY OBESE, negative abdominal pain, nondistended, positive soft, bowel sounds, no rebound, no ascites, no appreciable mass EXTREMITIES: left AKA with wound VAC in place Skin: Negative rashes, lesions, ulcers Psychiatric:  Negative depression, negative anxiety, negative fatigue, negative mania  Central nervous system:  Cranial nerves II through XII intact, tongue/uvula midline, all extremities muscle strength 5/5, sensation intact throughout, negative dysarthria, negative expressive aphasia, negative receptive aphasia.  .     Data Reviewed: Care during the described time interval was provided by me .  I have reviewed this patient's available data, including medical history, events of note, physical examination, and all test results as part of my evaluation.  CBC: Recent Labs  Lab 10/08/20 1014 10/09/20 0252 10/10/20 0213 10/11/20 0137 10/12/20 0210  WBC 13.1* 11.1* 12.1* 11.7* 9.5  NEUTROABS 10.5* 7.8* 9.4* 8.7* 7.1  HGB 10.6* 9.8* 10.8* 10.7* 10.7*  HCT 33.0* 32.0* 33.3* 35.4* 33.4*  MCV 86.2 90.7 87.2 90.5 89.3  PLT 293 258 243 316 440   Basic Metabolic Panel: Recent Labs  Lab 10/08/20 1014 10/09/20 0252 10/10/20 0507 10/11/20 0137 10/12/20 0210  NA 137 135 138 141 140  K 4.3 4.1 4.3 4.8 4.3  CL 104 104 105 106 107  CO2 _1 GLUCOSE 97 179* 165* 138* 124*  BUN _2 CREATININE 0.80 0.82 0.77 0.79 0.68  CALCIUM 8.0* 7.5* 7.9* 8.2* 7.9*  MG 1.9 1.9 2.0 2.0 1.9  PHOS 3.4 3.8 3.8 3.9 3.9   GFR: Estimated Creatinine Clearance: 90.4 mL/min (by C-G formula based on SCr of 0.68 mg/dL). Liver Function Tests: Recent Labs  Lab 10/08/20 1014  10/09/20 0252 10/10/20 0507 10/11/20 0137 10/12/20 0210  AST _3 ALT _4 ALKPHOS 143* 111 110 104 86  BILITOT 0.6 0.6 0.5 0.5 0.5  PROT 6.7 5.6* 5.9* 6.1* 6.1*  ALBUMIN 1.5* 1.4*  1.4* 1.5* 1.6*   No results for input(s): LIPASE, AMYLASE in the last 168 hours. No results for input(s): AMMONIA in the last 168 hours. Coagulation Profile: No results for input(s): INR, PROTIME in the last 168 hours. Cardiac Enzymes: No results for input(s): CKTOTAL, CKMB, CKMBINDEX, TROPONINI in the last 168 hours. BNP (last 3 results) No results for input(s): PROBNP in the last 8760 hours. HbA1C: No results for input(s): HGBA1C in the last 72 hours. CBG: Recent Labs  Lab 10/11/20 0605 10/11/20 1157 10/11/20 1615 10/11/20 2056 10/12/20 0625  GLUCAP 106* 177* 121* 106* 84   Lipid Profile: No results for input(s): CHOL, HDL, LDLCALC, TRIG, CHOLHDL, LDLDIRECT in the last 72 hours. Thyroid Function Tests: No results for input(s): TSH, T4TOTAL, FREET4, T3FREE, THYROIDAB in the last 72 hours. Anemia Panel: No results for input(s): VITAMINB12, FOLATE, FERRITIN, TIBC, IRON, RETICCTPCT in the last 72 hours. Sepsis Labs: No results for input(s): PROCALCITON, LATICACIDVEN in the last 168 hours.  Recent Results (from the past 240 hour(s))  Urine culture     Status: Abnormal   Collection Time: 10/04/20 10:20 PM   Specimen: In/Out Cath Urine  Result Value Ref Range Status   Specimen Description   Final    IN/OUT CATH URINE Performed at [facility] [address], [place], [place] [address]    Special Requests   Final    NONE Performed at [facility], [place] [address], [place], [place] [address]    Culture MULTIPLE SPECIES PRESENT, SUGGEST RECOLLECTION (A)  Final   Report Status 10/06/2020 FINAL  Final  Blood Culture (routine x 2)     Status: Abnormal   Collection Time: 10/04/20 10:20 PM   Specimen: BLOOD  Result Value Ref Range  Status   Specimen Description   Final    BLOOD RIGHT ANTECUBITAL Performed at [facility] [address], [place], [place] [address]    Special Requests   Final    BOTTLES DRAWN AEROBIC AND ANAEROBIC Blood Culture results may not be optimal due to an inadequate volume of blood received in culture bottles Performed at [facility] [address], [place], [place] [address]    Culture  Setup Time   Final    GRAM NEGATIVE RODS ANAEROBIC BOTTLE ONLY GRAM POSITIVE COCCI IN PAIRS CRITICAL RESULT CALLED TO, READ BACK BY AND VERIFIED WITH: PHARMD J MILLEN [id number] AT 1544 BY CM GRAM POSITIVE COCCI AEROBIC BOTTLE ONLY    Culture (A)  Final    PROTEUS MIRABILIS STREPTOCOCCUS CONSTELLATUS BACTEROIDES FRAGILIS BETA LACTAMASE POSITIVE Performed at [facility], [place] [address] [place], [place] [address]    Report Status 10/09/2020 FINAL  Final   Organism ID, Bacteria PROTEUS MIRABILIS  Final   Organism ID, Bacteria STREPTOCOCCUS CONSTELLATUS  Final      Susceptibility   Streptococcus constellatus - MIC*    PENICILLIN 0.25 INTERMEDIATE Intermediate     CEFTRIAXONE 1 SENSITIVE Sensitive     ERYTHROMYCIN <=0.12 SENSITIVE Sensitive     LEVOFLOXACIN 0.5 SENSITIVE Sensitive     VANCOMYCIN 0.5 SENSITIVE Sensitive     * STREPTOCOCCUS CONSTELLATUS   Proteus mirabilis - MIC*    AMPICILLIN >=32 RESISTANT Resistant     CEFAZOLIN 8 SENSITIVE Sensitive     CEFEPIME <=0.12 SENSITIVE Sensitive     CEFTAZIDIME <=1 SENSITIVE Sensitive     CEFTRIAXONE <=0.25 SENSITIVE Sensitive     CIPROFLOXACIN 2 INTERMEDIATE Intermediate     GENTAMICIN <=  1 SENSITIVE Sensitive     IMIPENEM 2 SENSITIVE Sensitive     TRIMETH/SULFA >=320 RESISTANT Resistant     AMPICILLIN/SULBACTAM 8 SENSITIVE Sensitive     PIP/TAZO <=4 SENSITIVE Sensitive     * PROTEUS MIRABILIS  Blood Culture ID Panel (Reflexed)     Status: Abnormal   Collection Time: 10/04/20 10:20 PM  Result Value Ref  Range Status   Enterococcus faecalis NOT DETECTED NOT DETECTED Final   Enterococcus Faecium NOT DETECTED NOT DETECTED Final   Listeria monocytogenes NOT DETECTED NOT DETECTED Final   Staphylococcus species NOT DETECTED NOT DETECTED Final   Staphylococcus aureus (BCID) NOT DETECTED NOT DETECTED Final   Staphylococcus epidermidis NOT DETECTED NOT DETECTED Final   Staphylococcus lugdunensis NOT DETECTED NOT DETECTED Final   Streptococcus species DETECTED (A) NOT DETECTED Final    Comment: Not Enterococcus species, Streptococcus agalactiae, Streptococcus pyogenes, or Streptococcus pneumoniae. CRITICAL RESULT CALLED TO, READ BACK BY AND VERIFIED WITH: PHARMD J MILLEN [id number] ST 1545 BY CM    Streptococcus agalactiae NOT DETECTED NOT DETECTED Final   Streptococcus pneumoniae NOT DETECTED NOT DETECTED Final   Streptococcus pyogenes NOT DETECTED NOT DETECTED Final   A.calcoaceticus-baumannii NOT DETECTED NOT DETECTED Final   Bacteroides fragilis DETECTED (A) NOT DETECTED Final    Comment: CRITICAL RESULT CALLED TO, READ BACK BY AND VERIFIED WITH: PHARMD J MILLEN [id number] AT 1545 BY CM    Enterobacterales DETECTED (A) NOT DETECTED Final    Comment: Enterobacterales represent a large order of gram negative bacteria, not a single organism. CRITICAL RESULT CALLED TO, READ BACK BY AND VERIFIED WITH: PHARMD J MILLEN [id number] AT 1545 BY CM    Enterobacter cloacae complex NOT DETECTED NOT DETECTED Final   Escherichia coli NOT DETECTED NOT DETECTED Final   Klebsiella aerogenes NOT DETECTED NOT DETECTED Final   Klebsiella oxytoca NOT DETECTED NOT DETECTED Final   Klebsiella pneumoniae NOT DETECTED NOT DETECTED Final   Proteus species DETECTED (A) NOT DETECTED Final    Comment: CRITICAL RESULT CALLED TO, READ BACK BY AND VERIFIED WITH: PHARMD J MILLEN [id number] AT 1545 BY CM    Salmonella species NOT DETECTED NOT DETECTED Final   Serratia marcescens NOT DETECTED NOT DETECTED Final   Haemophilus influenzae  NOT DETECTED NOT DETECTED Final   Neisseria meningitidis NOT DETECTED NOT DETECTED Final   Pseudomonas aeruginosa NOT DETECTED NOT DETECTED Final   Stenotrophomonas maltophilia NOT DETECTED NOT DETECTED Final   Candida albicans NOT DETECTED NOT DETECTED Final   Candida auris NOT DETECTED NOT DETECTED Final   Candida glabrata NOT DETECTED NOT DETECTED Final   Candida krusei NOT DETECTED NOT DETECTED Final   Candida parapsilosis NOT DETECTED NOT DETECTED Final   Candida tropicalis NOT DETECTED NOT DETECTED Final   Cryptococcus neoformans/gattii NOT DETECTED NOT DETECTED Final   CTX-M ESBL NOT DETECTED NOT DETECTED Final   Carbapenem resistance IMP NOT DETECTED NOT DETECTED Final   Carbapenem resistance KPC NOT DETECTED NOT DETECTED Final   Carbapenem resistance NDM NOT DETECTED NOT DETECTED Final   Carbapenem resist OXA 48 LIKE NOT DETECTED NOT DETECTED Final   Carbapenem resistance VIM NOT DETECTED NOT DETECTED Final    Comment: Performed at [facility], [address] [place], [place] [address]  Blood Culture (routine x 2)     Status: Abnormal   Collection Time: 10/04/20 10:21 PM   Specimen: BLOOD LEFT HAND  Result Value Ref Range Status   Specimen Description   Final  BLOOD LEFT HAND Performed at [facility] [address] [place], [place] [address]    Special Requests   Final    BOTTLES DRAWN AEROBIC AND ANAEROBIC Blood Culture adequate volume Performed at [facility] [address], [place], [name] [address]    Culture  Setup Time   Final    GRAM NEGATIVE RODS GRAM POSITIVE COCCI IN CHAINS ANAEROBIC BOTTLE ONLY CRITICAL VALUE NOTED.  VALUE IS CONSISTENT WITH PREVIOUSLY REPORTED AND CALLED VALUE. GRAM POSITIVE COCCI IN CHAINS AEROBIC BOTTLE ONLY    Culture (A)  Final    PROTEUS MIRABILIS STREPTOCOCCUS CONSTELLATUS SUSCEPTIBILITIES PERFORMED ON PREVIOUS CULTURE WITHIN THE LAST 5 DAYS. Performed at [facility],  [place] [address], [place], [place] [address]    Report Status 10/08/2020 FINAL  Final  Wound or Superficial Culture     Status: None   Collection Time: 10/04/20 10:30 PM   Specimen: Wound  Result Value Ref Range Status   Specimen Description   Final    WOUND Performed at [facility] [address], [place], [place] [address]    Special Requests   Final    LEFT FOOT Performed at [facility], [place] [address], [place], [place] [address]    Gram Stain   Final    NO WBC SEEN MODERATE GRAM POSITIVE COCCI IN PAIRS MODERATE GRAM NEGATIVE RODS Performed at [facility], [place] [address], [place], [place] [address]    Culture   Final    ABUNDANT PROTEUS MIRABILIS FEW STAPHYLOCOCCUS PSEUDINTERMEDIUS    Report Status 10/10/2020 FINAL  Final   Organism ID, Bacteria PROTEUS MIRABILIS  Final   Organism ID, Bacteria STAPHYLOCOCCUS PSEUDINTERMEDIUS  Final      Susceptibility   Proteus mirabilis - MIC*    AMPICILLIN >=32 RESISTANT Resistant     CEFAZOLIN 8 SENSITIVE Sensitive     CEFEPIME <=0.12 SENSITIVE Sensitive     CEFTAZIDIME <=1 SENSITIVE Sensitive     CEFTRIAXONE <=0.25 SENSITIVE Sensitive     CIPROFLOXACIN 2 INTERMEDIATE Intermediate     GENTAMICIN <=1 SENSITIVE Sensitive     IMIPENEM 2 SENSITIVE Sensitive     TRIMETH/SULFA >=320 RESISTANT Resistant     AMPICILLIN/SULBACTAM 4 SENSITIVE Sensitive     PIP/TAZO <=4 SENSITIVE Sensitive     * ABUNDANT PROTEUS MIRABILIS   Staphylococcus pseudintermedius - MIC*    CIPROFLOXACIN <=0.5 SENSITIVE Sensitive     ERYTHROMYCIN <=0.25 SENSITIVE Sensitive     GENTAMICIN <=0.5 SENSITIVE Sensitive     OXACILLIN <=0.25 SENSITIVE Sensitive     TETRACYCLINE <=1 SENSITIVE Sensitive     VANCOMYCIN <=0.5 SENSITIVE Sensitive     TRIMETH/SULFA <=10 SENSITIVE Sensitive     CLINDAMYCIN <=0.25 SENSITIVE Sensitive     RIFAMPIN <=0.5 SENSITIVE Sensitive     Inducible Clindamycin NEGATIVE Sensitive     * FEW  STAPHYLOCOCCUS PSEUDINTERMEDIUS  Resp Panel by RT-PCR (Flu A&B, Covid) Foot, Left     Status: None   Collection Time: 10/04/20 11:06 PM   Specimen: Foot, Left; Nasopharyngeal(NP) swabs in vial transport medium  Result Value Ref Range Status   SARS Coronavirus 2 by RT PCR NEGATIVE NEGATIVE Final    Comment: (NOTE) SARS-CoV-2 target nucleic acids are NOT DETECTED.  The SARS-CoV-2 RNA is generally detectable in upper respiratory specimens during the acute phase of infection. The lowest concentration of SARS-CoV-2 viral copies this assay can detect is 138 copies/mL. A negative result does not preclude  SARS-Cov-2 infection and should not be used as the sole basis for treatment or other patient management decisions. A negative result may occur with  improper specimen collection/handling, submission of specimen other than nasopharyngeal swab, presence of viral mutation(s) within the areas targeted by this assay, and inadequate number of viral copies(<138 copies/mL). A negative result must be combined with clinical observations, patient history, and epidemiological information. The expected result is Negative.  Fact Sheet for Patients:  EntrepreneurPulse.com.au  Fact Sheet for Healthcare Providers:  IncredibleEmployment.be  This test is no t yet approved or cleared by the [place] FDA and  has been authorized for detection and/or diagnosis of SARS-CoV-2 by FDA under an Emergency Use Authorization (EUA). This EUA will remain  in effect (meaning this test can be used) for the duration of the COVID-19 declaration under Section 564(b)(1) of the Act, 21 U.S.C.section 360bbb-3(b)(1), unless the authorization is terminated  or revoked sooner.       Influenza A by PCR NEGATIVE NEGATIVE Final   Influenza B by PCR NEGATIVE NEGATIVE Final    Comment: (NOTE) The Xpert Xpress SARS-CoV-2/FLU/RSV plus assay is intended as an aid in the diagnosis of influenza  from Nasopharyngeal swab specimens and should not be used as a sole basis for treatment. Nasal washings and aspirates are unacceptable for Xpert Xpress SARS-CoV-2/FLU/RSV testing.  Fact Sheet for Patients: EntrepreneurPulse.com.au  Fact Sheet for Healthcare Providers: IncredibleEmployment.be  This test is not yet approved or cleared by the [place] FDA and has been authorized for detection and/or diagnosis of SARS-CoV-2 by FDA under an Emergency Use Authorization (EUA). This EUA will remain in effect (meaning this test can be used) for the duration of the COVID-19 declaration under Section 564(b)(1) of the Act, 21 U.S.C. section 360bbb-3(b)(1), unless the authorization is terminated or revoked.  Performed at [facility], [place] [address], [place], [place] [address]   Surgical PCR screen     Status: None   Collection Time: 10/05/20  8:54 PM   Specimen: Nasal Mucosa; Nasal Swab  Result Value Ref Range Status   MRSA, PCR NEGATIVE NEGATIVE Final   Staphylococcus aureus NEGATIVE NEGATIVE Final    Comment: (NOTE) The Xpert SA Assay (FDA approved for NASAL specimens in patients 32 years of age and older), is one component of a comprehensive surveillance program. It is not intended to diagnose infection nor to guide or monitor treatment. Performed at [facility], [place] [address] [place], [place] [address]   Culture, blood (routine x 2)     Status: None   Collection Time: 10/07/20  3:45 PM   Specimen: BLOOD  Result Value Ref Range Status   Specimen Description BLOOD RIGHT ANTECUBITAL  Final   Special Requests   Final    BOTTLES DRAWN AEROBIC AND ANAEROBIC Blood Culture adequate volume   Culture   Final    NO GROWTH 5 DAYS Performed at [facility], [place] [address] [place], [place] [address]    Report Status 10/12/2020 FINAL  Final  Culture, blood (routine x 2)     Status: None   Collection Time: 10/07/20   3:54 PM   Specimen: BLOOD  Result Value Ref Range Status   Specimen Description BLOOD RIGHT ANTECUBITAL  Final   Special Requests   Final    BOTTLES DRAWN AEROBIC ONLY Blood Culture adequate volume   Culture   Final    NO GROWTH 5 DAYS Performed at [facility], [place]  [address] [place], [place] [address]    Report Status 10/12/2020 FINAL  Final         Radiology Studies: [place] EKG SITE RITE  Result Date: 10/11/2020 If Site Rite image not attached, placement could not be confirmed due to current cardiac rhythm.       Scheduled Meds: . Chlorhexidine Gluconate Cloth  6 each Topical Daily  . collagenase   Topical Daily  . docusate sodium  100 mg Oral BID  . fluticasone furoate-vilanterol  1 puff Inhalation Daily  . gabapentin  300 mg Oral TID  . insulin aspart  0-20 Units Subcutaneous TID WC  . insulin aspart  0-5 Units Subcutaneous QHS  . insulin glargine  50 Units Subcutaneous Daily  . ketorolac  15 mg Intravenous Q6H  . metroNIDAZOLE  500 mg Oral Q8H  . omeprazole  20 mg Oral Daily  . oxyCODONE  10 mg Oral Q12H  . rosuvastatin  20 mg Oral Daily   Continuous Infusions: . sodium chloride Stopped (10/07/20 1903)  . cefTRIAXone (ROCEPHIN)  IV 2 g (10/11/20 1535)  . methocarbamol (ROBAXIN) IV       LOS: 8 days    Time spent:40 min    Sentoria Brent, Geraldo Docker, MD [facility] Pager [phone]  If 7PM-7AM, please contact night-coverage [email or web address] Password [email or web address] 10/12/2020, 10:06 AM

## 2020-10-12 NOTE — Progress Notes (Signed)
     Subjective: 6 Days Post-Op Procedure(s) (LRB): AMPUTATION BELOW KNEE (Left) Left BKA with VAC, morbid obesity. Little or no drainage.  Patient reports pain as mild.    Objective:   VITALS:  Temp:  [97.9 F (36.6 C)-98.7 F (37.1 C)] 97.9 F (36.6 C) (12/05 1136) Pulse Rate:  [64-75] 74 (12/05 1136) Resp:  [16-19] 19 (12/05 1136) BP: (137-155)/(64-74) 138/71 (12/05 1136) SpO2:  [92 %-100 %] 95 % (12/05 1136)  Neurologically intact ABD soft Neurovascular intact Sensation intact distally Incision: dressing C/D/I, no drainage and VAC to negative pressure intact.  No cellulitis present Compartment soft   LABS Recent Labs    10/10/20 0213 10/10/20 0213 10/11/20 0137 10/12/20 0210  HGB 10.8*  --  10.7* 10.7*  WBC 12.1*   < > 11.7* 9.5  PLT 243   < > 316 301   < > = values in this interval not displayed.   Recent Labs    10/11/20 0137 10/12/20 0210  NA 141 140  K 4.8 4.3  CL 106 107  CO2 26 25  BUN 11 11  CREATININE 0.79 0.68  GLUCOSE 138* 124*   No results for input(s): LABPT, INR in the last 72 hours.   Assessment/Plan: 6 Days Post-Op Procedure(s) (LRB): AMPUTATION BELOW KNEE (Left)  Advance diet Up with therapy Discharge to SNF  Vira Browns 10/12/2020, 1:26 PM

## 2020-10-12 NOTE — Progress Notes (Signed)
Peripherally Inserted Central Catheter Placement  The IV Nurse has discussed with the patient and/or persons authorized to consent for the patient, the purpose of this procedure and the potential benefits and risks involved with this procedure.  The benefits include less needle sticks, lab draws from the catheter, and the patient may be discharged home with the catheter. Risks include, but not limited to, infection, bleeding, blood clot (thrombus formation), and puncture of an artery; nerve damage and irregular heartbeat and possibility to perform a PICC exchange if needed/ordered by physician.  Alternatives to this procedure were also discussed.  Bard Power PICC patient education guide, fact sheet on infection prevention and patient information card has been provided to patient /or left at bedside.    PICC Placement Documentation  PICC Single Lumen 10/12/20 PICC Right Basilic 49 cm 0 cm (Active)  Indication for Insertion or Continuance of Line Home intravenous therapies (PICC only) 10/12/20 0948  Exposed Catheter (cm) 0 cm 10/12/20 0948  Site Assessment Clean;Dry;Intact 10/12/20 0948  Line Status Flushed;Saline locked;Blood return noted 10/12/20 0948  Dressing Type Transparent 10/12/20 0948  Dressing Status Clean;Dry;Intact 10/12/20 0948  Antimicrobial disc in place? Yes 10/12/20 0948  Safety Lock Not Applicable 10/12/20 0948  Line Care Connections checked and tightened 10/12/20 0948  Line Adjustment (NICU/IV Team Only) No 10/12/20 0948  Dressing Intervention New dressing 10/12/20 0948  Dressing Change Due 10/19/20 10/12/20 0948       Yesenia Payne 10/12/2020, 9:49 AM

## 2020-10-13 DIAGNOSIS — A419 Sepsis, unspecified organism: Secondary | ICD-10-CM | POA: Diagnosis not present

## 2020-10-13 LAB — CBC WITH DIFFERENTIAL/PLATELET
Abs Immature Granulocytes: 0.12 10*3/uL — ABNORMAL HIGH (ref 0.00–0.07)
Basophils Absolute: 0.1 10*3/uL (ref 0.0–0.1)
Basophils Relative: 1 %
Eosinophils Absolute: 0.2 10*3/uL (ref 0.0–0.5)
Eosinophils Relative: 3 %
HCT: 32.1 % — ABNORMAL LOW (ref 36.0–46.0)
Hemoglobin: 10.2 g/dL — ABNORMAL LOW (ref 12.0–15.0)
Immature Granulocytes: 2 %
Lymphocytes Relative: 18 %
Lymphs Abs: 1.4 10*3/uL (ref 0.7–4.0)
MCH: 28.4 pg (ref 26.0–34.0)
MCHC: 31.8 g/dL (ref 30.0–36.0)
MCV: 89.4 fL (ref 80.0–100.0)
Monocytes Absolute: 0.5 10*3/uL (ref 0.1–1.0)
Monocytes Relative: 6 %
Neutro Abs: 5.4 10*3/uL (ref 1.7–7.7)
Neutrophils Relative %: 70 %
Platelets: 306 10*3/uL (ref 150–400)
RBC: 3.59 MIL/uL — ABNORMAL LOW (ref 3.87–5.11)
RDW: 16 % — ABNORMAL HIGH (ref 11.5–15.5)
WBC: 7.7 10*3/uL (ref 4.0–10.5)
nRBC: 0 % (ref 0.0–0.2)

## 2020-10-13 LAB — COMPREHENSIVE METABOLIC PANEL
ALT: 16 U/L (ref 0–44)
AST: 20 U/L (ref 15–41)
Albumin: 1.6 g/dL — ABNORMAL LOW (ref 3.5–5.0)
Alkaline Phosphatase: 78 U/L (ref 38–126)
Anion gap: 6 (ref 5–15)
BUN: 10 mg/dL (ref 8–23)
CO2: 28 mmol/L (ref 22–32)
Calcium: 8.1 mg/dL — ABNORMAL LOW (ref 8.9–10.3)
Chloride: 106 mmol/L (ref 98–111)
Creatinine, Ser: 0.64 mg/dL (ref 0.44–1.00)
GFR, Estimated: >60 mL/min (ref >60)
Glucose, Bld: 90 mg/dL (ref 70–99)
Potassium: 4.2 mmol/L (ref 3.5–5.1)
Sodium: 140 mmol/L (ref 135–145)
Total Bilirubin: 0.7 mg/dL (ref 0.3–1.2)
Total Protein: 5.9 g/dL — ABNORMAL LOW (ref 6.5–8.1)

## 2020-10-13 LAB — PHOSPHORUS: Phosphorus: 4.3 mg/dL (ref 2.5–4.6)

## 2020-10-13 LAB — MAGNESIUM: Magnesium: 1.9 mg/dL (ref 1.7–2.4)

## 2020-10-13 LAB — GLUCOSE, CAPILLARY
Glucose-Capillary: 80 mg/dL (ref 70–99)
Glucose-Capillary: 166 mg/dL — ABNORMAL HIGH (ref 70–99)
Glucose-Capillary: 108 mg/dL — ABNORMAL HIGH (ref 70–99)
Glucose-Capillary: 100 mg/dL — ABNORMAL HIGH (ref 70–99)

## 2020-10-13 MED ORDER — INSULIN GLARGINE 100 UNIT/ML ~~LOC~~ SOLN
30.0000 [IU] | Freq: Every day | SUBCUTANEOUS | Status: DC
  Administered 2020-10-13 – 2020-10-14 (×2): 30 [IU] via SUBCUTANEOUS
  Filled 2020-10-13 (×3): qty 0.3

## 2020-10-13 NOTE — Progress Notes (Addendum)
Physical Therapy Treatment Patient Details Name: Yesenia Payne MRN: [id number] DOB: 04-08-1951 Today's Date: 10/13/2020    History of Present Illness 69 y.o.-year-old female medical history significant for hypertension, iddm, hyperlipidemia presented to ED 11/27 when L heel abcess burst. Admitted for treatment of abscess and necrotic tissue left foot and leg s/p 11/29 L transtibial amputation.     PT Comments    Pt encouraged that she was able to sit EoB over the weekend and is agreeable to try again today. Pt requires modAx2 for coming to EoB. Able to sit for 7-8 minutes able to progress to min guard for safety with balance for about 30 sec. Otherwise blocked R knee to keep from sliding off bed. With knee blocked pt able to perform UE exercises within her BoS. D/c plans remain appropriate at this time although pt is refusing as such pt will need PTAR home and hoyer lift and hospital bed for mobility. PT will continue to follow acutely.    Follow Up Recommendations  SNF     Equipment Recommendations  Wheelchair (measurements PT);Wheelchair cushion (measurements PT);3in1 (PT);Hospital bed;Other (comment) (hoyer lift)       Precautions / Restrictions Precautions Precautions: Fall Precaution Comments: new L BKA with wound vac Restrictions Weight Bearing Restrictions: Yes LLE Weight Bearing: Non weight bearing    Mobility  Bed Mobility Overal bed mobility: Needs Assistance Bed Mobility: Rolling Rolling: Mod assist   Supine to sit: +2 for physical assistance;Mod assist;HOB elevated Sit to supine: Max assist;+2 for physical assistance   General bed mobility comments: modA for rolling for clean pad placement, modA x2 for coming to EoB, vc for use of bedrail and management of LE off bed, maxAx2 for returning to supine mainly to get LE back into bed  Transfers                 General transfer comment: working to get bariatric recliner to be able to [facility] pt to  chair      Balance Overall balance assessment: Needs assistance Sitting-balance support: Feet supported;Bilateral upper extremity supported;No upper extremity supported;Single extremity supported Sitting balance-Leahy Scale: Poor Sitting balance - Comments: pt able to progress to no UE support for about a minute, however generally requires UE support                                     Cognition Arousal/Alertness: Awake/alert Behavior During Therapy: WFL for tasks assessed/performed Overall Cognitive Status: Within Functional Limits for tasks assessed                                        Exercises General Exercises - Lower Extremity Long Arc Quad: Left;AROM;10 reps;Seated Other Exercises Other Exercises: cross body reaching each side x5  Other Exercises: shoulder flexion x10     General Comments General comments (skin integrity, edema, etc.): VSS on RA      Pertinent Vitals/Pain Pain Assessment: Faces Faces Pain Scale: Hurts little more Pain Location: back and L LE Pain Descriptors / Indicators: Burning;Aching;Sharp;Throbbing Pain Intervention(s): Limited activity within patient's tolerance;Monitored during session;Repositioned           PT Goals (current goals can now be found in the care plan section) Acute Rehab PT Goals Patient Stated Goal: to get OOB to [facility] or recliner PT Goal Formulation: With  patient/family Time For Goal Achievement: 10/21/20 Potential to Achieve Goals: Fair Progress towards PT goals: Progressing toward goals    Frequency    Min 3X/week      PT Plan Current plan remains appropriate       AM-PAC PT "6 Clicks" Mobility   Outcome Measure  Help needed turning from your back to your side while in a flat bed without using bedrails?: A Lot Help needed moving from lying on your back to sitting on the side of a flat bed without using bedrails?: Total Help needed moving to and from a bed to a chair  (including a wheelchair)?: Total Help needed standing up from a chair using your arms (e.g., wheelchair or bedside chair)?: Total Help needed to walk in hospital room?: Total Help needed climbing 3-5 steps with a railing? : Total 6 Click Score: 7    End of Session   Activity Tolerance: Patient tolerated treatment well Patient left: in bed;with call bell/phone within reach Nurse Communication: Mobility status PT Visit Diagnosis: Unsteadiness on feet (R26.81);Other abnormalities of gait and mobility (R26.89);Muscle weakness (generalized) (M62.81);Difficulty in walking, not elsewhere classified (R26.2);Pain Pain - Right/Left: Left Pain - part of body: Leg     Time: 3704-8889 PT Time Calculation (min) (ACUTE ONLY): 29 min  Charges:  $Therapeutic Exercise: 8-22 mins $Therapeutic Activity: 8-22 mins                     Yesenia Payne B. Beverely Risen PT, DPT Acute Rehabilitation Services Pager [phone] Office [phone]    Yesenia Payne [facility] 10/13/2020, 4:34 PM

## 2020-10-13 NOTE — Progress Notes (Signed)
Confirmed with [employer] - hospital bed should be delivered to the home tomorrow morning- 10/14/20- all other DME has already been delivered to the home.

## 2020-10-13 NOTE — Progress Notes (Signed)
Mobility Specialist - Progress Note   10/13/20 1528  Mobility  Activity  (Cancel)   Pt being seen by PT, will f/u as able.   Mamie Levers Mobility Specialist Mobility Specialist Phone: [phone]

## 2020-10-13 NOTE — Care Management Important Message (Signed)
Important Message  Patient Details  Name: Yesenia Payne MRN: [id number] Date of Birth: 08/07/1951   Medicare Important Message Given:  Yes     [name] 10/13/2020, 10:09 AM

## 2020-10-13 NOTE — Progress Notes (Signed)
PROGRESS NOTE   Yesenia Payne  YPP:[id number] DOB: 1951/08/22 DOA: 10/04/2020 PCP: Merri Brunette, MD  Brief Narrative:  69 year old white female known history HTN HLD DM TY 2 cognitive impairment admitted 10/04/2020 lower extremity wound that has been red and swollen for the prior 2 weeks Sepsis on admission 2/2 Right lower extremity infection (strep, Proteus) diabetic heel ulcer gas gangrene as well as new onset A. fib RVR this admission Infectious disease consulted-on cefazolin vancomycin-->ceftriaxone--echocardiogram ordered no vegetations Dr. Lajoyce Corners consulted underwent transtibial amputation 10/06/2020  Assessment & Plan:   Active Problems:   Necrotizing cellulitis   Sepsis (HCC)   Cutaneous abscess of left foot   Anemia   Pressure injury of skin   Morbidly obese (HCC)   1. Necrotizing abscess left foot status post transtibial amputation a. Further outpatient management per Dr. Lajoyce Corners b. Seems to be stabilizing and likely can discharge complete treatment and follow-up with him in 2 weeks 2. Bacteremia from prior to admission secondary to Proteus Mirabella's, strep species + Bacteroides a. PICC line placed 12/5 continue antibiotics 10 days status post surgery ending 12/8 b. Continue ceftriaxone every 24 until 12/8 c. Continue flagyll 500 3 times daily in addition until that time 3. Diabetes mellitus type 2 A1c 7.2 a. CBGs ranging 80-120 therefore cut back Lantus from 50 to 30 units b. Continue resistant coverage and at bedtime scale c. As an outpatient use.  Apparently 47 units 3 times daily?,  90 units Lantus daily? d. Continue gabapentin 800 4 times daily (extremely high dose) caution and may need to be cut back 4. Anemia of acute blood loss a. Not transfused this hospitalization 5. HTN a. Amlodipine 10 held, lisinopril 20 held, labetalol 100 held and resume slowly 6. HLD  a. Cont crestor 20 qd 7. Morbidly obese BMI 53 8. Multiple pressure injuries from prior to  admission  DVT prophylaxis: scd Code Status: full Family Communication:  Disposition:  Status is: Inpatient  Remains inpatient appropriate because:Persistent severe electrolyte disturbances and Ongoing diagnostic testing needed not appropriate for outpatient work up   Dispo: The patient is from: Home              Anticipated d/c is to: Home              Anticipated d/c date is: 1 day              Patient currently is not medically stable to d/c.       Consultants:   Dr. Lajoyce Corners of orthopedics  Procedures: Transtibial amputation 11/29  Antimicrobials: Ceftriaxone and Flagyl   Subjective: Seen and examined doing well no issues pain is moderately controlled with movement She has a VAC in place She has no chest pain fever or chills Categorically patient states that she wants to discharge home and does not wish to go to rehab  Objective: Vitals:   10/12/20 2347 10/13/20 0000 10/13/20 0350 10/13/20 0400  BP: (!) 167/76 (!) 151/76 134/70 128/70  Pulse: 71 73 68 63  Resp: 18 19 16 17   Temp: 98.1 F (36.7 C)   98.1 F (36.7 C)  TempSrc: Oral   Oral  SpO2: 98% 98% 95% 97%  Weight:      Height:        Intake/Output Summary (Last 24 hours) at 10/13/2020 0729 Last data filed at 10/12/2020 1500 Gross per 24 hour  Intake 480 ml  Output 600 ml  Net -120 ml   Filed Weights   10/04/20 2136  10/07/20 0552 10/08/20 0500  Weight: 124.7 kg (!) 142 kg (!) 139.3 kg    Examination:  EOMI NCAT no focal deficit pleasant thick neck poor dentition Neck soft supple Chest clear anteriorly discharge extremities Large hematoma right upper extremity forearm S1-S2 irregularly irregular A. fib on monitoring Abdomen obese nontender no rebound no guarding  Data Reviewed: I have personally reviewed following labs and imaging studies Potassium 4.2 BUNs/creatinine 10/0.6 Calcium 8.1 WBC 7.7, hemoglobin 10.2, platelet 306   Radiology Studies: DG CHEST PORT 1 VIEW  Result Date:  10/12/2020 CLINICAL DATA:  PICC line placement EXAM: PORTABLE CHEST 1 VIEW COMPARISON:  Chest x-ray dated 10/04/2020 FINDINGS: RIGHT-sided PICC line appears well positioned with tip at the level of the lower SVC/cavoatrial junction. Stable cardiomegaly. Lungs are clear. No pleural effusion or pneumothorax is seen. Osseous structures about the chest are unremarkable. IMPRESSION: 1. RIGHT-sided PICC line well positioned with tip at the level of the lower SVC/cavoatrial junction. 2. Stable cardiomegaly. No evidence of pneumonia or pulmonary edema. Electronically Signed   By: Bary Richard M.D.   On: 10/12/2020 10:10   [place] EKG SITE RITE  Result Date: 10/11/2020 If Site Rite image not attached, placement could not be confirmed due to current cardiac rhythm.    Scheduled Meds: . Chlorhexidine Gluconate Cloth  6 each Topical Daily  . collagenase   Topical Daily  . docusate sodium  100 mg Oral BID  . fluticasone furoate-vilanterol  1 puff Inhalation Daily  . gabapentin  300 mg Oral TID  . insulin aspart  0-20 Units Subcutaneous TID WC  . insulin aspart  0-5 Units Subcutaneous QHS  . insulin glargine  50 Units Subcutaneous Daily  . ketorolac  15 mg Intravenous Q6H  . metroNIDAZOLE  500 mg Oral Q8H  . omeprazole  20 mg Oral Daily  . oxyCODONE  10 mg Oral Q12H  . rosuvastatin  20 mg Oral Daily  . sodium chloride flush  10-40 mL Intracatheter Q12H   Continuous Infusions: . sodium chloride Stopped (10/07/20 1903)  . cefTRIAXone (ROCEPHIN)  IV 2 g (10/12/20 1830)  . methocarbamol (ROBAXIN) IV       LOS: 9 days    Time spent: 2  Rhetta Mura, MD [facility] To contact the attending provider between 7A-7P or the covering provider during after hours 7P-7A, please log into the web site [email or web address] and access using universal [place] password for that web site. If you do not have the password, please call the hospital operator.  10/13/2020, 7:29 AM

## 2020-10-14 ENCOUNTER — Telehealth: Payer: Self-pay | Admitting: Orthopedic Surgery

## 2020-10-14 DIAGNOSIS — L0390 Cellulitis, unspecified: Secondary | ICD-10-CM | POA: Diagnosis not present

## 2020-10-14 LAB — CBC WITH DIFFERENTIAL/PLATELET
Abs Immature Granulocytes: 0.09 10*3/uL — ABNORMAL HIGH (ref 0.00–0.07)
Basophils Absolute: 0.1 10*3/uL (ref 0.0–0.1)
Basophils Relative: 1 %
Eosinophils Absolute: 0.3 10*3/uL (ref 0.0–0.5)
Eosinophils Relative: 3 %
HCT: 34 % — ABNORMAL LOW (ref 36.0–46.0)
Hemoglobin: 10.3 g/dL — ABNORMAL LOW (ref 12.0–15.0)
Immature Granulocytes: 1 %
Lymphocytes Relative: 15 %
Lymphs Abs: 1.5 10*3/uL (ref 0.7–4.0)
MCH: 27.5 pg (ref 26.0–34.0)
MCHC: 30.3 g/dL (ref 30.0–36.0)
MCV: 90.9 fL (ref 80.0–100.0)
Monocytes Absolute: 0.6 10*3/uL (ref 0.1–1.0)
Monocytes Relative: 6 %
Neutro Abs: 7.5 10*3/uL (ref 1.7–7.7)
Neutrophils Relative %: 74 %
Platelets: 330 10*3/uL (ref 150–400)
RBC: 3.74 MIL/uL — ABNORMAL LOW (ref 3.87–5.11)
RDW: 16.2 % — ABNORMAL HIGH (ref 11.5–15.5)
WBC: 9.9 10*3/uL (ref 4.0–10.5)
nRBC: 0 % (ref 0.0–0.2)

## 2020-10-14 LAB — GLUCOSE, CAPILLARY
Glucose-Capillary: 66 mg/dL — ABNORMAL LOW (ref 70–99)
Glucose-Capillary: 78 mg/dL (ref 70–99)
Glucose-Capillary: 130 mg/dL — ABNORMAL HIGH (ref 70–99)
Glucose-Capillary: 88 mg/dL (ref 70–99)

## 2020-10-14 LAB — CREATININE, SERUM
Creatinine, Ser: 0.66 mg/dL (ref 0.44–1.00)
GFR, Estimated: >60 mL/min (ref >60)

## 2020-10-14 LAB — MAGNESIUM: Magnesium: 1.7 mg/dL (ref 1.7–2.4)

## 2020-10-14 LAB — PHOSPHORUS: Phosphorus: 4.1 mg/dL (ref 2.5–4.6)

## 2020-10-14 MED ORDER — METHOCARBAMOL 500 MG PO TABS: 500.0000 mg | ORAL_TABLET | Freq: Four times a day (QID) | ORAL | 0 refills | Status: DC | PRN

## 2020-10-14 MED ORDER — COLLAGENASE 250 UNIT/GM EX OINT: TOPICAL_OINTMENT | Freq: Every day | CUTANEOUS | 0 refills | Status: AC

## 2020-10-14 MED ORDER — CEFTRIAXONE IV (FOR PTA / DISCHARGE USE ONLY): 2.0000 g | INTRAVENOUS | 0 refills | Status: AC

## 2020-10-14 MED ORDER — GABAPENTIN 300 MG PO CAPS: 300.0000 mg | ORAL_CAPSULE | Freq: Three times a day (TID) | ORAL | 0 refills | Status: AC

## 2020-10-14 MED ORDER — METRONIDAZOLE 500 MG PO TABS: 500.0000 mg | ORAL_TABLET | Freq: Three times a day (TID) | ORAL | 0 refills | Status: AC

## 2020-10-14 MED ORDER — INSULIN GLARGINE 100 UNIT/ML ~~LOC~~ SOLN: 30.0000 [IU] | Freq: Every day | SUBCUTANEOUS | 11 refills | Status: AC

## 2020-10-14 MED ORDER — METOPROLOL SUCCINATE ER 50 MG PO TB24: 50.0000 mg | ORAL_TABLET | Freq: Every day | ORAL | 3 refills | Status: DC

## 2020-10-14 NOTE — Progress Notes (Signed)
Discharge instructions reviewed with patient. Medications and follow-up reviewed. All questions answered. PICC line to be left in for home abx. Patient to be escorted home by patient transport.   Allegra Grana RN

## 2020-10-14 NOTE — Telephone Encounter (Signed)
Patient's daughter Efraim Kaufmann called stating patient is being released from [facility] today and the nurse [facility] (Discharge Coordinated) asked  Her to contact Dr Lajoyce Corners for an order. Neysa Bonito said the order need to read  Patient will need transport to all medical appointments as she is now an amputee. The diag code need to be on the order as well and signed by Dr Lajoyce Corners per [place] and the insurance company. Melissa said she will pick the order up when it is ready. The number to contact Efraim Kaufmann is [phone]

## 2020-10-14 NOTE — TOC Transition Note (Addendum)
Transition of Care (TOC) - CM/SW Discharge Note Donn Pierini RN, BSN Transitions of Care Unit 4E- RN Case Manager See Treatment Team for direct phone #    Patient Details  Name: Yesenia Payne MRN: [id number] Date of Birth: 03/22/1951  Transition of Care [facility]) CM/SW Contact:  Darrold Span, RN Phone Number: 10/14/2020, 12:27 PM   Clinical Narrative:    Pt stable for transition home today, Per [employer] hospital bed to be delivered to home this am. Spoke with Yesenia Payne from [place] regarding home IV abx- education has been completed with daughter- pt will need to get today's dose here prior to discharge- pharmacy will reschedule for noon.- bedside RN aware. Have notified [facility] of discharge home today for start of care HH needs.  1120- spoke with daughter - hospital bed is still pending delivery- daughter states she has been in touch with [employer] and is awaiting their call. Daughter to call this writer once bed has arrived to home- so PTAR transport can be called.   13- spoke with daughter Yesenia Payne- confirmed [employer] is at the home now to deliver bed- PTAR called to schedule transport home- paperwork placed on shadow chart- bedside RN updated.    Final next level of care: Home w Home Health Services Barriers to Discharge: Barriers Resolved   Patient Goals and CMS Choice Patient states their goals for this hospitalization and ongoing recovery are:: return home CMS Medicare.gov Compare Post Acute Care list provided to:: Patient Choice offered to / list presented to : Patient, Adult Children  Discharge Placement                 Home with [facility]      Discharge Plan and Services In-house Referral: Clinical Social Work Discharge Planning Services: CM Consult Post Acute Care Choice: Durable Medical Equipment, Home Health          DME Arranged: 3-N-1, Wheelchair manual, Hospital bed, Other see comment DME Agency: [employer] Date DME Agency Contacted: 10/13/20 Time DME Agency  Contacted: 1000 Representative spoke with at DME Agency: Ian Malkin HH Arranged: PT, OT HH Agency: [facility] Date HH Agency Contacted: 10/11/20 Time HH Agency Contacted: 1146 Representative spoke with at [facility] Agency: Pearson Grippe  Social Determinants of Health (SDOH) Interventions     Readmission Risk Interventions Readmission Risk Prevention Plan 10/14/2020 10/10/2020  Transportation Screening - Complete  PCP or Specialist Appt within 5-7 Days Complete -  Home Care Screening Complete -  Medication Review (RN CM) Complete -  Some recent data might be hidden

## 2020-10-14 NOTE — Discharge Summary (Addendum)
Physician Discharge Summary  Yesenia Payne AST:[id number] DOB: 07/11/1951 DOA: 10/04/2020  PCP: Carol Ada, MD  Admit date: 10/04/2020 Discharge date: 10/14/2020  Time spent: 40 minutes  Recommendations for Outpatient Follow-up:  1. End date of antibiotics 10/16/2020-PICC line to be removed by home health agency subsequently 2. Follow-up for transtibial amputation left side with Dr. Sharol Given in 1 to 2 weeks 3. Needs Chem-12, CBC in 1 week 4. Requires frequent turning as has bedsore and high risk of breakdown therefore DME ordered 5. Will require discussion about anticoagulation at PCP office in 1 to 2 weeks once hemoglobin stabilizes CHADS2 score is about 4  Discharge Diagnoses:  Active Problems:   Necrotizing cellulitis   Sepsis ([place])   Cutaneous abscess of left foot   Anemia   Pressure injury of skin   Morbidly obese [facility])   Discharge Condition: Improved  Diet recommendation: Diabetic heart healthy  Filed Weights   10/04/20 2136 10/07/20 0552 10/08/20 0500  Weight: 124.7 kg (!) 142 kg (!) 139.3 kg    History of present illness:  69 year old white female known history HTN HLD DM TY 2 cognitive impairment admitted 10/04/2020 lower extremity wound that has been red and swollen for the prior 2 weeks Sepsis on admission 2/2 Right lower extremity infection (strep, Proteus) diabetic heel ulcer gas gangrene as well as new onset A. fib RVR this admission Infectious disease consulted-on cefazolin vancomycin-->ceftriaxone--echocardiogram ordered no vegetations Dr. Sharol Given consulted underwent transtibial amputation 10/06/2020  Hospital Course:  1. Necrotizing abscess left foot status post transtibial amputation a. Further outpatient management per Dr. Sharol Given --- wound VAC was removed this admission staples and sutures were in place b. Oxycodone and OxyContin have been prescribed and she will get a muscle relaxer c. Seems to be stabilizing and likely can discharge complete treatment and  follow-up with him in 1 weeks 2. Bacteremia from prior to admission secondary to Proteus Mirabella's, strep species + Bacteroides a. PICC line placed 12/5 continue antibiotics 10 days status post surgery ending 12/8 b. Continue ceftriaxone every 24 until 12/9 c. Continue flagyll 500 3 times daily in addition until that time d. PICC line to be removed by home health agency on discharge 3. Atrial fibrillation this admission-CHADS2 score >4 1. New onset 2. Resume metoprolol XL 50 this admission 3. Holding off on anticoagulation for the time being given recent surgery and will need discussion in the outpatient setting once all labs and procedures as well as line removals are performed 4. Diabetes mellitus type 2 A1c 7.2 a. CBGs ranging 80-120 therefore cut back Lantus from 50 to 30 units b. Continue resistant coverage and at bedtime scale c. As an outpatient uses  Apparently 47 units 3 times daily?,  90 units Lantus daily? d. Continue gabapentin 800 4 times daily (extremely high dose) caution and may need to be cut back in the outpatient setting if not done already 5. Anemia of acute blood loss a. Not transfused this hospitalization 6. HTN a. Amlodipine 10 held, lisinopril 20 held, labetalol 100 held and resume slowly 7. HLD  a. Cont crestor 20 qd 8. Morbidly obese BMI 53 9. Multiple pressure injuries on sacrum from prior to admission 1. Can have Santyl dressings to sacrum 2. Home health ordered  Procedures: Left transtibial amputation Consultations:  Orthopedics  Discharge Exam: Vitals:   10/14/20 0400 10/14/20 0944  BP: (!) 151/75 (!) 153/79  Pulse: 77   Resp: 18 19  Temp: 97.6 F (36.4 C) 98 F (36.7 C)  SpO2: 92% 96%    General: Awake coherent no distress pain is moderately controlled--wound VAC was taken off earlier today Cardiovascular: S1-S2 no murmur no rub no gallop on monitors seems to be rate controlled A. fib Respiratory: Clinically clear no rales no  rhonchi Abdomen soft no rebound no guarding  Discharge Instructions   Discharge Instructions    [facility] pharmacist to adjust dose for Vancomycin, Aminoglycosides and other anti-infective therapies as requested by physician.   Complete by: As directed    [facility] to provide Cath Flo 32m   Complete by: As directed    Administer for PICC line occlusion and as ordered by physician for other access device issues.   Anaphylaxis Kit: Provided to treat any anaphylactic reaction to the medication being provided to the patient if First Dose or when requested by physician   Complete by: As directed    Epinephrine 192mml vial / amp: Administer 0.48m6m0.48ml31mubcutaneously once for moderate to severe anaphylaxis, nurse to call physician and pharmacy when reaction occurs and call 911 if needed for immediate care   Diphenhydramine 50mg42mIV vial: Administer 25-50mg 90mM PRN for first dose reaction, rash, itching, mild reaction, nurse to call physician and pharmacy when reaction occurs   Sodium Chloride 0.9% NS 500ml I71mdminister if needed for hypovolemic blood pressure drop or as ordered by physician after call to physician with anaphylactic reaction   Change dressing   Complete by: As directed    Change dressing daily. Dry dressing and ace wrap. May cleanse wound with antibacterial soap such as dial and water.   Change dressing on IV access line weekly and PRN   Complete by: As directed    Diet - low sodium heart healthy   Complete by: As directed    Discharge instructions   Complete by: As directed    Make sure that you complete the therapy completely of all of the antibiotics that you get-you should complete the IV antibiotics and oral antibiotics on 12/9-the special IV or PICC line will be removed subsequently He will need to follow-up with Dr. Duda inSharol Given outpatient setting with regards to your amputation get a shrinker and hopefully we will be able to fit you so  that you were able to use a prosthesis You will need labs in about 1 week and we will order home health to help you To set you will be prescribed a lot less insulin than you previously were on and that your gabapentin dose has been cut back substantially as this has a risk of causing confusion although this will help with your nervelike pain You will need to keep a close check on your blood pressure-this admission we have discontinued a bunch of blood pressure medications but only resumed your metoprolol XL which you can continue-your lisinopril and other medications may need to be resumed if we feel this is necessary on discharge after recheck at your primary physician office Wound care and dressings as per the orthopedics doctors who remove the vacuum and only recommend you get a dry dressing Because of the severity of infection this hospital admission you were diagnosed with an irregular heart rate called atrial fibrillation-metoprolol we will treat this however you may require being on a blood thinner once all of your postop surgical issues in addition to your bleeding and hematomas recover-we have not placed you on a blood thinner because of your anemia this admission and the bleeding that you have had so  please follow-up with your primary care physician to discuss the same   Discharge wound care:   Complete by: As directed    Wound Care Orders (From admission, onward)    Start     Ordered   10/15/20 0500   Apply dressing  Daily      Comments: Dry dressing. May cleanse wound with antibacterial sopa and water  10/14/20 0742   10/10/20 0827   Apply dressing  Every shift      Comments: Remove wound vac prior to discharge.  10/10/20 0827   10/06/20 1543   Reinforce dressing  Until discontinued       10/06/20 1542   10/06/20 1543   Negative Pressure Wound Therapy With Incisional  Until discontinued      Question Answer Comment Amount of suction? 125 mm/Hg  Suction  Type? Continuous  Remove Dressing? Do not remove dressing    10/06/20 1542   10/06/20 1000   Foam dressing  Every 3 days     Comments: 1. Foam dressing to the right ischium (deep tissue injury); lift to inspect q shift each shift for evolution 2. Foam dressing to the left posterior thigh (bruising) for protection  10/05/20 1638   Flush IV access with Sodium Chloride 0.9% and Heparin 10 units/ml or 100 units/ml   Complete by: As directed    Home infusion instructions - [facility]   Complete by: As directed    Instructions: Flush IV access with Sodium Chloride 0.9% and Heparin 10units/ml or 100units/ml   Change dressing on IV access line: Weekly and PRN   Instructions Cath Flo 37m: Administer for PICC Line occlusion and as ordered by physician for other access device   [facility] pharmacist to adjust dose for: Vancomycin, Aminoglycosides and other anti-infective therapies as requested by physician   Increase activity slowly   Complete by: As directed    Method of administration may be changed at the discretion of home infusion pharmacist based upon assessment of the patient and/or caregiver's ability to self-administer the medication ordered   Complete by: As directed      Allergies as of 10/14/2020      Reactions   Iodinated Diagnostic Agents Hives   Other reaction(s): Hives   Metrizamide Hives      Medication List    STOP taking these medications   gabapentin 800 MG tablet Commonly known as: NEURONTIN Replaced by: gabapentin 300 MG capsule   insulin lispro 100 UNIT/ML injection Commonly known as: HUMALOG   labetalol 100 MG tablet Commonly known as: NORMODYNE   lisinopril 20 MG tablet Commonly known as: ZESTRIL   potassium chloride 10 MEQ tablet Commonly known as: KLOR-CON     TAKE these medications   albuterol (2.5 MG/3ML) 0.083% nebulizer solution Commonly known as: PROVENTIL Take 2.5 mg by nebulization 3 (three) times daily as needed.  For shortness of breath. What changed: Another medication with the same name was removed. Continue taking this medication, and follow the directions you see here.   amLODipine 10 MG tablet Commonly known as: NORVASC Take 10 mg by mouth daily.   cefTRIAXone  IVPB Commonly known as: ROCEPHIN Inject 2 g into the vein daily for 8 days. Indication:  Strep and proteus  bacteremia First Dose: Yes Last Day of Therapy:  10/16/20 Labs - Once weekly:  CBC/D and BMP, Labs - Every other week:  ESR and CRP Method of administration: IV Push Method of administration may be changed at the  discretion of home infusion pharmacist based upon assessment of the patient and/or caregiver's ability to self-administer the medication ordered.   collagenase ointment Commonly known as: SANTYL Apply topically daily. Start taking on: October 15, 2020   desloratadine 5 MG tablet Commonly known as: CLARINEX Take 5 mg by mouth daily as needed.   Fluticasone-Salmeterol 250-50 MCG/DOSE Aepb Commonly known as: ADVAIR Inhale 1 puff into the lungs daily.   furosemide 20 MG tablet Commonly known as: LASIX Take 40 mg by mouth 2 (two) times daily.   gabapentin 300 MG capsule Commonly known as: NEURONTIN Take 1 capsule (300 mg total) by mouth 3 (three) times daily. Replaces: gabapentin 800 MG tablet   insulin glargine 100 UNIT/ML injection Commonly known as: LANTUS Inject 0.3 mLs (30 Units total) into the skin daily. What changed: how much to take   methocarbamol 500 MG tablet Commonly known as: ROBAXIN Take 1 tablet (500 mg total) by mouth every 6 (six) hours as needed for muscle spasms.   metoprolol succinate 50 MG 24 hr tablet Commonly known as: TOPROL-XL Take 1 tablet (50 mg total) by mouth daily. Take with or immediately following a meal.   metroNIDAZOLE 500 MG tablet Commonly known as: FLAGYL Take 1 tablet (500 mg total) by mouth 3 (three) times daily for 2 days. Treat through 10/16/2020    oxyCODONE-acetaminophen 5-325 MG tablet Commonly known as: PERCOCET/ROXICET Take 1 tablet by mouth every 4 (four) hours as needed for moderate pain.   rosuvastatin 20 MG tablet Commonly known as: CRESTOR 1 tablet 2 times daily.   Xtampza ER 9 MG C12a Generic drug: oxyCODONE ER Take 9 mg by mouth every 12 (twelve) hours as needed.            Durable Medical Equipment  (From admission, onward)         Start     Ordered   10/11/20 1135  For home use only DME 3 n 1  Once       Comments: Bariatric, weight 306 pounds   10/11/20 1135   10/11/20 1134  For home use only DME standard manual wheelchair with seat cushion  Once       Comments: Patient suffers from weakness which impairs their ability to perform daily activities like walking in the home.  A walker will not resolve issue with performing activities of daily living. A wheelchair will allow patient to safely perform daily activities. Patient can safely propel the wheelchair in the home or has a caregiver who can provide assistance. Length of need lifetime. Accessories: elevating leg rests (ELRs), wheel locks, extensions and anti-tippers.  Bariatric, weight 306 pounds   10/11/20 1135   10/11/20 1132  For home use only DME Hospital bed  Once       Comments: Bariatric bed for patient, weight 306 pounds  Question Answer Comment  Length of Need Lifetime   Patient has (list medical condition): multiple pressure injuries   The above medical condition requires: Patient requires the ability to reposition frequently   Bed type Semi-electric   Hoyer Lift Yes   Support Surface: Low Air loss Mattress      10/11/20 1135   10/11/20 1132  For home use only DME Other see comment  Once       Comments: Bed side table for bariatric bed, weight 306 pounds  Question:  Length of Need  Answer:  Lifetime   10/11/20 1135           Discharge Care  Instructions  (From admission, onward)         Start     Ordered   10/14/20 0000  Change  dressing on IV access line weekly and PRN  (Home infusion instructions - [facility] )        10/14/20 1002   10/14/20 0000  Change dressing       Comments: Change dressing daily. Dry dressing and ace wrap. May cleanse wound with antibacterial soap such as dial and water.   10/14/20 0741   10/14/20 0000  Discharge wound care:       Comments: Wound Care Orders (From admission, onward)    Start     Ordered   10/15/20 0500   Apply dressing  Daily      Comments: Dry dressing. May cleanse wound with antibacterial sopa and water  10/14/20 0742   10/10/20 0827   Apply dressing  Every shift      Comments: Remove wound vac prior to discharge.  10/10/20 0827   10/06/20 1543   Reinforce dressing  Until discontinued       10/06/20 1542   10/06/20 1543   Negative Pressure Wound Therapy With Incisional  Until discontinued      Question Answer Comment Amount of suction? 125 mm/Hg  Suction Type? Continuous  Remove Dressing? Do not remove dressing    10/06/20 1542   10/06/20 1000   Foam dressing  Every 3 days     Comments: 1. Foam dressing to the right ischium (deep tissue injury); lift to inspect q shift each shift for evolution 2. Foam dressing to the left posterior thigh (bruising) for protection  10/05/20 1638   10/14/20 1002         Allergies  Allergen Reactions  . Iodinated Diagnostic Agents Hives    Other reaction(s): Hives   . Metrizamide Hives    Follow-up Information    Newt Minion, MD In 1 week.   Specialty: Orthopedic Surgery Contact information: [address] [place] [address] [place]. Call.   Why: check with insurance for in-network providers (online or toll free # on back of card) can also use [facility] for [place] providers- click on Find a doctor. or can call [facility] for assistance in finding providers that are accepting new pt.  Contact information: [phone] Physician  referral line for assistance in finding a new primary provider               The results of significant diagnostics from this hospitalization (including imaging, microbiology, ancillary and laboratory) are listed below for reference.    Significant Diagnostic Studies: DG CHEST PORT 1 VIEW  Result Date: 10/12/2020 CLINICAL DATA:  PICC line placement EXAM: PORTABLE CHEST 1 VIEW COMPARISON:  Chest x-ray dated 10/04/2020 FINDINGS: RIGHT-sided PICC line appears well positioned with tip at the level of the lower SVC/cavoatrial junction. Stable cardiomegaly. Lungs are clear. No pleural effusion or pneumothorax is seen. Osseous structures about the chest are unremarkable. IMPRESSION: 1. RIGHT-sided PICC line well positioned with tip at the level of the lower SVC/cavoatrial junction. 2. Stable cardiomegaly. No evidence of pneumonia or pulmonary edema. Electronically Signed   By: Franki Cabot M.D.   On: 10/12/2020 10:10   DG Chest Port 1 View  Result Date: 10/04/2020 CLINICAL DATA:  Diabetic ulcer to the left foot with redness radiating up the left leg. Generalized weakness. Concern for sepsis. EXAM: PORTABLE CHEST 1 VIEW COMPARISON:  12/25/2015 FINDINGS: Cardiac enlargement. Normal pulmonary vascularity. Lungs are clear. No pleural effusions. No pneumothorax. Mediastinal contours appear intact. IMPRESSION: Cardiac enlargement.  No active disease. Electronically Signed   By: Lucienne Capers M.D.   On: 10/04/2020 22:35   DG Foot Complete Left  Result Date: 10/04/2020 CLINICAL DATA:  Diabetic ulcer over the left heel with redness radiating up the left leg. EXAM: LEFT FOOT - COMPLETE 3+ VIEW COMPARISON:  02/17/2011 FINDINGS: Previous amputation of the left third toe at the metatarsal phalangeal level. Degenerative changes in the interphalangeal, metatarsal-phalangeal, and intertarsal joints. No acute fracture or dislocation. Superficial ulceration is suggested in the plantar soft tissues over the  calcaneus consistent with history of ulceration. No bone changes to suggest osteomyelitis. Prominent diffuse soft tissue swelling over the left foot with soft tissue gas over the plantar and dorsal aspects. Changes are consistent with cellulitis due to gas-forming organism. IMPRESSION: Superficial ulceration in the plantar soft tissues over the calcaneus. Soft tissue gas over the plantar and dorsal aspects of the foot consistent with cellulitis due to gas-forming organism. No evidence of osteomyelitis. Electronically Signed   By: Lucienne Capers M.D.   On: 10/04/2020 22:37   ECHOCARDIOGRAM COMPLETE  Result Date: 10/07/2020    ECHOCARDIOGRAM REPORT   Patient Name:   Yesenia Payne Date of Exam: 10/07/2020 Medical Rec #:  [id number]         Height:       63.3 in Accession #:    [id number]        Weight:       313.0 lb Date of Birth:  [birth year]         BSA:          2.346 m Patient Age:    8 years          BP:           132/75 mmHg Patient Gender: F                 HR:           84 bpm. Exam Location:  Inpatient Procedure: 2D Echo, 3D Echo, Strain Analysis, Color Doppler, Cardiac Doppler and            Intracardiac Opacification Agent Indications:    Bacteremia 790.7 / R78.81  History:        Patient has no prior history of Echocardiogram examinations.                 Risk Factors:Hypertension and Dyslipidemia. Transtibial                 amNecrotizing cellulitis                 Sepsisputation on the morning of 10/06/2020.  Sonographer:    Darlina Sicilian RDCS Referring Phys: [id number] AVA SWAYZE IMPRESSIONS  1. Left ventricular ejection fraction, by estimation, is 50 to 55%. The left ventricle has low normal function. The left ventricle demonstrates regional wall motion abnormalities (see scoring diagram/findings for description). There is mild left ventricular hypertrophy. Left ventricular diastolic function could not be evaluated. There is mild hypokinesis of the left ventricular, basal inferior wall. The  average left ventricular global longitudinal strain is -10.3 %. The global longitudinal strain is abnormal.  2. Right ventricular systolic function is mildly reduced. The right ventricular size is normal. There is mildly elevated pulmonary artery systolic pressure.  3. A small pericardial effusion is present.  4. The mitral valve is  grossly normal. Trivial mitral valve regurgitation.  5. The aortic valve is tricuspid. There is mild calcification of the aortic valve. Aortic valve regurgitation is not visualized. Mild aortic valve sclerosis is present, with no evidence of aortic valve stenosis.  6. The inferior vena cava is dilated in size with <50% respiratory variability, suggesting right atrial pressure of 15 mmHg. Comparison(s): No prior Echocardiogram. Conclusion(s)/Recommendation(s): No evidence of valvular vegetations on this transthoracic echocardiogram. Would recommend a transesophageal echocardiogram to exclude infective endocarditis if clinically indicated. FINDINGS  Left Ventricle: Left ventricular ejection fraction, by estimation, is 50 to 55%. The left ventricle has low normal function. The left ventricle demonstrates regional wall motion abnormalities. Mild hypokinesis of the left ventricular, basal inferior wall. Definity contrast agent was given IV to delineate the left ventricular endocardial borders. The average left ventricular global longitudinal strain is -10.3 %. The global longitudinal strain is abnormal. The left ventricular internal cavity size was normal in size. There is mild left ventricular hypertrophy. Left ventricular diastolic function could not be evaluated due to atrial fibrillation. Left ventricular diastolic function could not be evaluated. Right Ventricle: The right ventricular size is normal. No increase in right ventricular wall thickness. Right ventricular systolic function is mildly reduced. There is mildly elevated pulmonary artery systolic pressure. The tricuspid regurgitant  velocity  is 2.35 m/s, and with an assumed right atrial pressure of 15 mmHg, the estimated right ventricular systolic pressure is 58.0 mmHg. Left Atrium: Left atrial size was normal in size. Right Atrium: Right atrial size was normal in size. Pericardium: A small pericardial effusion is present. Mitral Valve: The mitral valve is grossly normal. There is mild calcification of the mitral valve leaflet(s). Mild mitral annular calcification. Trivial mitral valve regurgitation. Tricuspid Valve: The tricuspid valve is normal in structure. Tricuspid valve regurgitation is mild . No evidence of tricuspid stenosis. Aortic Valve: The aortic valve is tricuspid. There is mild calcification of the aortic valve. Aortic valve regurgitation is not visualized. Mild aortic valve sclerosis is present, with no evidence of aortic valve stenosis. Pulmonic Valve: The pulmonic valve was grossly normal. Pulmonic valve regurgitation is trivial. No evidence of pulmonic stenosis. Aorta: The aortic root and ascending aorta are structurally normal, with no evidence of dilitation. Venous: The inferior vena cava is dilated in size with less than 50% respiratory variability, suggesting right atrial pressure of 15 mmHg. IAS/Shunts: The atrial septum is grossly normal.  LEFT VENTRICLE PLAX 2D LVIDd:         5.10 cm  Diastology LVIDs:         4.13 cm  LV e' medial:    9.28 cm/s LV PW:         1.20 cm  LV E/e' medial:  13.0 LV IVS:        0.97 cm  LV e' lateral:   8.81 cm/s LVOT diam:     1.80 cm  LV E/e' lateral: 13.7 LV SV:         44 LV SV Index:   19       2D Longitudinal Strain LVOT Area:     2.54 cm 2D Strain GLS (A2C):   -8.2 %                         2D Strain GLS (A3C):   -12.7 %  2D Strain GLS (A4C):   -10.1 %                         2D Strain GLS Avg:     -10.3 % RIGHT VENTRICLE RV S prime:     11.05 cm/s TAPSE (M-mode): 1.3 cm LEFT ATRIUM             Index       RIGHT ATRIUM           Index LA diam:        4.10 cm  1.75 cm/m  RA Area:     17.90 cm LA Vol (A2C):   35.1 ml 14.96 ml/m RA Volume:   43.00 ml  18.33 ml/m LA Vol (A4C):   32.7 ml 13.94 ml/m LA Biplane Vol: 33.9 ml 14.45 ml/m  AORTIC VALVE LVOT Vmax:   95.30 cm/s LVOT Vmean:  59.900 cm/s LVOT VTI:    0.172 m  AORTA Ao Root diam: 3.10 cm MITRAL VALVE                TRICUSPID VALVE MV Area (PHT): 4.04 cm     TR Peak grad:   22.1 mmHg MV Decel Time: 188 msec     TR Vmax:        235.00 cm/s MR Peak grad: 72.9 mmHg MR Mean grad: 53.0 mmHg     SHUNTS MR Vmax:      427.00 cm/s   Systemic VTI:  0.17 m MR Vmean:     344.0 cm/s    Systemic Diam: 1.80 cm MV E velocity: 120.67 cm/s Buford Dresser MD Electronically signed by Buford Dresser MD Signature Date/Time: 10/07/2020/1:16:22 PM    Final    [place] EKG SITE RITE  Result Date: 10/11/2020 If Site Rite image not attached, placement could not be confirmed due to current cardiac rhythm.   Microbiology: Recent Results (from the past 240 hour(s))  Urine culture     Status: Abnormal   Collection Time: 10/04/20 10:20 PM   Specimen: In/Out Cath Urine  Result Value Ref Range Status   Specimen Description   Final    IN/OUT CATH URINE Performed at [facility] [address] [place], [place] [address]    Special Requests   Final    NONE Performed at [facility], [place] [address] [place], [place] [address]    Culture MULTIPLE SPECIES PRESENT, SUGGEST RECOLLECTION (A)  Final   Report Status 10/06/2020 FINAL  Final  Blood Culture (routine x 2)     Status: Abnormal   Collection Time: 10/04/20 10:20 PM   Specimen: BLOOD  Result Value Ref Range Status   Specimen Description   Final    BLOOD RIGHT ANTECUBITAL Performed at [facility] [address], [place], [place] [address]    Special Requests   Final    BOTTLES DRAWN AEROBIC AND ANAEROBIC Blood Culture results may not be optimal due to an inadequate volume of blood received in culture  bottles Performed at [facility] [address], [place], [place] [address]    Culture  Setup Time   Final    GRAM NEGATIVE RODS ANAEROBIC BOTTLE ONLY GRAM POSITIVE COCCI IN PAIRS CRITICAL RESULT CALLED TO, READ BACK BY AND VERIFIED WITH: PHARMD J MILLEN [id number] AT 1544 BY CM GRAM POSITIVE COCCI AEROBIC BOTTLE ONLY    Culture (A)  Final    PROTEUS MIRABILIS STREPTOCOCCUS CONSTELLATUS  BACTEROIDES FRAGILIS BETA LACTAMASE POSITIVE Performed at [facility], [place] [address] [place],  [address]    Report Status 10/09/2020 FINAL  Final   Organism ID, Bacteria PROTEUS MIRABILIS  Final   Organism ID, Bacteria STREPTOCOCCUS CONSTELLATUS  Final      Susceptibility   Streptococcus constellatus - MIC*    PENICILLIN 0.25 INTERMEDIATE Intermediate     CEFTRIAXONE 1 SENSITIVE Sensitive     ERYTHROMYCIN <=0.12 SENSITIVE Sensitive     LEVOFLOXACIN 0.5 SENSITIVE Sensitive     VANCOMYCIN 0.5 SENSITIVE Sensitive     * STREPTOCOCCUS CONSTELLATUS   Proteus mirabilis - MIC*    AMPICILLIN >=32 RESISTANT Resistant     CEFAZOLIN 8 SENSITIVE Sensitive     CEFEPIME <=0.12 SENSITIVE Sensitive     CEFTAZIDIME <=1 SENSITIVE Sensitive     CEFTRIAXONE <=0.25 SENSITIVE Sensitive     CIPROFLOXACIN 2 INTERMEDIATE Intermediate     GENTAMICIN <=1 SENSITIVE Sensitive     IMIPENEM 2 SENSITIVE Sensitive     TRIMETH/SULFA >=320 RESISTANT Resistant     AMPICILLIN/SULBACTAM 8 SENSITIVE Sensitive     PIP/TAZO <=4 SENSITIVE Sensitive     * PROTEUS MIRABILIS  Blood Culture ID Panel (Reflexed)     Status: Abnormal   Collection Time: 10/04/20 10:20 PM  Result Value Ref Range Status   Enterococcus faecalis NOT DETECTED NOT DETECTED Final   Enterococcus Faecium NOT DETECTED NOT DETECTED Final   Listeria monocytogenes NOT DETECTED NOT DETECTED Final   Staphylococcus species NOT DETECTED NOT DETECTED Final   Staphylococcus aureus (BCID) NOT DETECTED NOT DETECTED Final   Staphylococcus  epidermidis NOT DETECTED NOT DETECTED Final   Staphylococcus lugdunensis NOT DETECTED NOT DETECTED Final   Streptococcus species DETECTED (A) NOT DETECTED Final    Comment: Not Enterococcus species, Streptococcus agalactiae, Streptococcus pyogenes, or Streptococcus pneumoniae. CRITICAL RESULT CALLED TO, READ BACK BY AND VERIFIED WITH: PHARMD J MILLEN [id number] ST 1545 BY CM    Streptococcus agalactiae NOT DETECTED NOT DETECTED Final   Streptococcus pneumoniae NOT DETECTED NOT DETECTED Final   Streptococcus pyogenes NOT DETECTED NOT DETECTED Final   A.calcoaceticus-baumannii NOT DETECTED NOT DETECTED Final   Bacteroides fragilis DETECTED (A) NOT DETECTED Final    Comment: CRITICAL RESULT CALLED TO, READ BACK BY AND VERIFIED WITH: PHARMD J MILLEN [id number] AT 1545 BY CM    Enterobacterales DETECTED (A) NOT DETECTED Final    Comment: Enterobacterales represent a large order of gram negative bacteria, not a single organism. CRITICAL RESULT CALLED TO, READ BACK BY AND VERIFIED WITH: PHARMD J MILLEN [id number] AT 1545 BY CM    Enterobacter cloacae complex NOT DETECTED NOT DETECTED Final   Escherichia coli NOT DETECTED NOT DETECTED Final   Klebsiella aerogenes NOT DETECTED NOT DETECTED Final   Klebsiella oxytoca NOT DETECTED NOT DETECTED Final   Klebsiella pneumoniae NOT DETECTED NOT DETECTED Final   Proteus species DETECTED (A) NOT DETECTED Final    Comment: CRITICAL RESULT CALLED TO, READ BACK BY AND VERIFIED WITH: PHARMD J MILLEN [id number] AT 1545 BY CM    Salmonella species NOT DETECTED NOT DETECTED Final   Serratia marcescens NOT DETECTED NOT DETECTED Final   Haemophilus influenzae NOT DETECTED NOT DETECTED Final   Neisseria meningitidis NOT DETECTED NOT DETECTED Final   Pseudomonas aeruginosa NOT DETECTED NOT DETECTED Final   Stenotrophomonas maltophilia NOT DETECTED NOT DETECTED Final   Candida albicans NOT DETECTED NOT DETECTED Final   Candida auris NOT DETECTED NOT DETECTED Final   Candida  glabrata NOT DETECTED NOT DETECTED Final   Candida krusei NOT DETECTED NOT DETECTED Final   Candida parapsilosis NOT DETECTED NOT DETECTED Final   Candida tropicalis NOT DETECTED NOT DETECTED Final   Cryptococcus neoformans/gattii NOT DETECTED NOT DETECTED Final   CTX-M ESBL NOT DETECTED NOT DETECTED Final   Carbapenem resistance IMP NOT DETECTED NOT DETECTED Final   Carbapenem resistance KPC NOT DETECTED NOT DETECTED Final   Carbapenem resistance NDM NOT DETECTED NOT DETECTED Final   Carbapenem resist OXA 48 LIKE NOT DETECTED NOT DETECTED Final   Carbapenem resistance VIM NOT DETECTED NOT DETECTED Final    Comment: Performed at [facility], [place] [address] [place], [place] [address]  Blood Culture (routine x 2)     Status: Abnormal   Collection Time: 10/04/20 10:21 PM   Specimen: BLOOD LEFT HAND  Result Value Ref Range Status   Specimen Description   Final    BLOOD LEFT HAND Performed at [facility] [address], [place], [name] [address]    Special Requests   Final    BOTTLES DRAWN AEROBIC AND ANAEROBIC Blood Culture adequate volume Performed at [facility] [address] [place], [place] [address]    Culture  Setup Time   Final    GRAM NEGATIVE RODS GRAM POSITIVE COCCI IN CHAINS ANAEROBIC BOTTLE ONLY CRITICAL VALUE NOTED.  VALUE IS CONSISTENT WITH PREVIOUSLY REPORTED AND CALLED VALUE. GRAM POSITIVE COCCI IN CHAINS AEROBIC BOTTLE ONLY    Culture (A)  Final    PROTEUS MIRABILIS STREPTOCOCCUS CONSTELLATUS SUSCEPTIBILITIES PERFORMED ON PREVIOUS CULTURE WITHIN THE LAST 5 DAYS. Performed at [facility], [place] [address], [place], [place] [address]    Report Status 10/08/2020 FINAL  Final  Wound or Superficial Culture     Status: None   Collection Time: 10/04/20 10:30 PM   Specimen: Wound  Result Value Ref Range Status   Specimen Description   Final    WOUND Performed at [facility] [address], [place], [place] [address]    Special Requests   Final    LEFT FOOT Performed at [facility], [place] [address], [place], [place] [address]    Gram Stain   Final    NO WBC SEEN MODERATE GRAM POSITIVE COCCI IN PAIRS MODERATE GRAM NEGATIVE RODS Performed at [facility], [place] [address] [place], [place] [address]    Culture   Final    ABUNDANT PROTEUS MIRABILIS FEW STAPHYLOCOCCUS PSEUDINTERMEDIUS    Report Status 10/10/2020 FINAL  Final   Organism ID, Bacteria PROTEUS MIRABILIS  Final   Organism ID, Bacteria STAPHYLOCOCCUS PSEUDINTERMEDIUS  Final      Susceptibility   Proteus mirabilis - MIC*    AMPICILLIN >=32 RESISTANT Resistant     CEFAZOLIN 8 SENSITIVE Sensitive     CEFEPIME <=0.12 SENSITIVE Sensitive     CEFTAZIDIME <=1 SENSITIVE Sensitive     CEFTRIAXONE <=0.25 SENSITIVE Sensitive     CIPROFLOXACIN 2 INTERMEDIATE Intermediate     GENTAMICIN <=1 SENSITIVE Sensitive     IMIPENEM 2 SENSITIVE Sensitive     TRIMETH/SULFA >=320 RESISTANT Resistant     AMPICILLIN/SULBACTAM 4 SENSITIVE Sensitive     PIP/TAZO <=4 SENSITIVE Sensitive     * ABUNDANT PROTEUS MIRABILIS   Staphylococcus pseudintermedius - MIC*    CIPROFLOXACIN <=0.5 SENSITIVE Sensitive     ERYTHROMYCIN <=0.25 SENSITIVE Sensitive     GENTAMICIN <=0.5 SENSITIVE Sensitive  OXACILLIN <=0.25 SENSITIVE Sensitive     TETRACYCLINE <=1 SENSITIVE Sensitive     VANCOMYCIN <=0.5 SENSITIVE Sensitive     TRIMETH/SULFA <=10 SENSITIVE Sensitive     CLINDAMYCIN <=0.25 SENSITIVE Sensitive     RIFAMPIN <=0.5 SENSITIVE Sensitive     Inducible Clindamycin NEGATIVE Sensitive     * FEW STAPHYLOCOCCUS PSEUDINTERMEDIUS  Resp Panel by RT-PCR (Flu A&B, Covid) Foot, Left     Status: None   Collection Time: 10/04/20 11:06 PM   Specimen: Foot, Left; Nasopharyngeal(NP) swabs in vial transport medium  Result Value Ref Range Status   SARS Coronavirus 2 by RT PCR NEGATIVE NEGATIVE Final    Comment: (NOTE) SARS-CoV-2  target nucleic acids are NOT DETECTED.  The SARS-CoV-2 RNA is generally detectable in upper respiratory specimens during the acute phase of infection. The lowest concentration of SARS-CoV-2 viral copies this assay can detect is 138 copies/mL. A negative result does not preclude SARS-Cov-2 infection and should not be used as the sole basis for treatment or other patient management decisions. A negative result may occur with  improper specimen collection/handling, submission of specimen other than nasopharyngeal swab, presence of viral mutation(s) within the areas targeted by this assay, and inadequate number of viral copies(<138 copies/mL). A negative result must be combined with clinical observations, patient history, and epidemiological information. The expected result is Negative.  Fact Sheet for Patients:  EntrepreneurPulse.com.au  Fact Sheet for Healthcare Providers:  IncredibleEmployment.be  This test is no t yet approved or cleared by the [place] FDA and  has been authorized for detection and/or diagnosis of SARS-CoV-2 by FDA under an Emergency Use Authorization (EUA). This EUA will remain  in effect (meaning this test can be used) for the duration of the COVID-19 declaration under Section 564(b)(1) of the Act, 21 U.S.C.section 360bbb-3(b)(1), unless the authorization is terminated  or revoked sooner.       Influenza A by PCR NEGATIVE NEGATIVE Final   Influenza B by PCR NEGATIVE NEGATIVE Final    Comment: (NOTE) The Xpert Xpress SARS-CoV-2/FLU/RSV plus assay is intended as an aid in the diagnosis of influenza from Nasopharyngeal swab specimens and should not be used as a sole basis for treatment. Nasal washings and aspirates are unacceptable for Xpert Xpress SARS-CoV-2/FLU/RSV testing.  Fact Sheet for Patients: EntrepreneurPulse.com.au  Fact Sheet for Healthcare  Providers: IncredibleEmployment.be  This test is not yet approved or cleared by the [place] FDA and has been authorized for detection and/or diagnosis of SARS-CoV-2 by FDA under an Emergency Use Authorization (EUA). This EUA will remain in effect (meaning this test can be used) for the duration of the COVID-19 declaration under Section 564(b)(1) of the Act, 21 U.S.C. section 360bbb-3(b)(1), unless the authorization is terminated or revoked.  Performed at [facility], [place] [address], [place], [place] [address]   Surgical PCR screen     Status: None   Collection Time: 10/05/20  8:54 PM   Specimen: Nasal Mucosa; Nasal Swab  Result Value Ref Range Status   MRSA, PCR NEGATIVE NEGATIVE Final   Staphylococcus aureus NEGATIVE NEGATIVE Final    Comment: (NOTE) The Xpert SA Assay (FDA approved for NASAL specimens in patients 45 years of age and older), is one component of a comprehensive surveillance program. It is not intended to diagnose infection nor to guide or monitor treatment. Performed at [facility], [place] [address], [place], [place] [address]   Culture, blood (routine x 2)     Status: None  Collection Time: 10/07/20  3:45 PM   Specimen: BLOOD  Result Value Ref Range Status   Specimen Description BLOOD RIGHT ANTECUBITAL  Final   Special Requests   Final    BOTTLES DRAWN AEROBIC AND ANAEROBIC Blood Culture adequate volume   Culture   Final    NO GROWTH 5 DAYS Performed at [facility], [address] [place], [place] [address]    Report Status 10/12/2020 FINAL  Final  Culture, blood (routine x 2)     Status: None   Collection Time: 10/07/20  3:54 PM   Specimen: BLOOD  Result Value Ref Range Status   Specimen Description BLOOD RIGHT ANTECUBITAL  Final   Special Requests   Final    BOTTLES DRAWN AEROBIC ONLY Blood Culture adequate volume   Culture   Final    NO GROWTH 5 DAYS Performed at [facility],  [place] [address] [place], [place] [address]    Report Status 10/12/2020 FINAL  Final     Labs: Basic Metabolic Panel: Recent Labs  Lab 10/09/20 0252 10/09/20 0252 10/10/20 0507 10/11/20 0137 10/12/20 0210 10/13/20 0529 10/14/20 0250  NA 135  --  138 141 140 140  --   K 4.1  --  4.3 4.8 4.3 4.2  --   CL 104  --  105 106 107 106  --   CO2 23  --  24 26 25 28   --   GLUCOSE 179*  --  165* 138* 124* 90  --   BUN 10  --  11 11 11 10   --   CREATININE 0.82   < > 0.77 0.79 0.68 0.64 0.66  CALCIUM 7.5*  --  7.9* 8.2* 7.9* 8.1*  --   MG 1.9   < > 2.0 2.0 1.9 1.9 1.7  PHOS 3.8   < > 3.8 3.9 3.9 4.3 4.1   < > = values in this interval not displayed.   Liver Function Tests: Recent Labs  Lab 10/09/20 0252 10/10/20 0507 10/11/20 0137 10/12/20 0210 10/13/20 0529  AST 21 15 17 18 20   ALT 18 17 17 16 16   ALKPHOS 111 110 104 86 78  BILITOT 0.6 0.5 0.5 0.5 0.7  PROT 5.6* 5.9* 6.1* 6.1* 5.9*  ALBUMIN 1.4* 1.4* 1.5* 1.6* 1.6*   No results for input(s): LIPASE, AMYLASE in the last 168 hours. No results for input(s): AMMONIA in the last 168 hours. CBC: Recent Labs  Lab 10/10/20 0213 10/11/20 0137 10/12/20 0210 10/13/20 0529 10/14/20 0250  WBC 12.1* 11.7* 9.5 7.7 9.9  NEUTROABS 9.4* 8.7* 7.1 5.4 7.5  HGB 10.8* 10.7* 10.7* 10.2* 10.3*  HCT 33.3* 35.4* 33.4* 32.1* 34.0*  MCV 87.2 90.5 89.3 89.4 90.9  PLT 243 316 301 306 330   Cardiac Enzymes: No results for input(s): CKTOTAL, CKMB, CKMBINDEX, TROPONINI in the last 168 hours. BNP: BNP (last 3 results) No results for input(s): BNP in the last 8760 hours.  ProBNP (last 3 results) No results for input(s): PROBNP in the last 8760 hours.  CBG: Recent Labs  Lab 10/13/20 1154 10/13/20 1623 10/13/20 2134 10/14/20 0605 10/14/20 0645  GLUCAP 166* 108* 100* 66* 78       Signed:  Nita Sells MD   [facility] 10/14/2020, 10:02 AM  \

## 2020-10-14 NOTE — Progress Notes (Signed)
Patient is postop day 8 status post left below-knee amputation.  She is in good spirits today and tells me she plans on being discharged to her home that she lives with her daughter.  She states she is just waiting for a hospital bed to be delivered  Wound VAC had 0 cc in the canister.  Wound VAC dressing was removed today.  She has well apposed wound edges.  Sutures and staples are in place.  Swelling is well controlled.  No drainage is noted.  No cellulitis no foul odor.  No areas of necrosis.  Discussed with the patient pain management.  After reviewing chart she has already been prescribed OxyContin and oxycodone.  She is asking for the muscle relaxer which is Robaxin.  I have sent this as well to her pharmacy.  Have placed orders for dressing changes.  Patient will follow up in our office in 1 week after discharge

## 2020-10-15 ENCOUNTER — Other Ambulatory Visit: Payer: Self-pay

## 2020-10-15 NOTE — Telephone Encounter (Signed)
Called and sw daughter to advise note is ready for pick up at the front desk.

## 2020-10-16 ENCOUNTER — Telehealth: Payer: Self-pay

## 2020-10-16 NOTE — Telephone Encounter (Signed)
Received call from [facility] RN confused about current plan with PICC. Per the patient, PICC is to remain in place until labs result to ensure further medication isn't needed. Per OPAT orders, PICC to be pulled after last dose (today). Patient's family is refusing to have PICC removed. RN to draw labs today. Spoke with provider, Earlene Plater, who approved PICC to remain in place until Monday 12/13 for labs to result and then it will be pulled. Mervin Kung, [facility] RN to notify pharmacy of change.   Angalina Ante Loyola Mast, RN

## 2020-10-20 ENCOUNTER — Telehealth: Payer: Self-pay | Admitting: Orthopedic Surgery

## 2020-10-20 NOTE — Telephone Encounter (Signed)
Please see below and call daughter s/p BKA

## 2020-10-20 NOTE — Telephone Encounter (Signed)
Patient does not need further antibiotics.  Recommend patient come in for follow-up in the office whenever it is convenient.  We do need to see the incision to determine appropriate treatment.

## 2020-10-20 NOTE — Telephone Encounter (Signed)
Patient's daughter Yesenia Payne called requesting a call back from Dr. Lajoyce Corners or Autumn F. Has questions if patient needs to continue ceftriaxone and if so please [facility] ref[facility]. Melissa also is requesting to have an other the phone visit due to no transportation. Melissa states she is still waiting to set up transportation for patient. Please call Melissa at [phone].

## 2020-10-21 ENCOUNTER — Other Ambulatory Visit: Payer: Self-pay | Admitting: Physician Assistant

## 2020-10-21 MED ORDER — METHOCARBAMOL 500 MG PO TABS: 500.0000 mg | ORAL_TABLET | Freq: Four times a day (QID) | ORAL | 0 refills | Status: DC | PRN

## 2020-10-21 MED ORDER — XTAMPZA ER 9 MG PO C12A: 9.0000 mg | EXTENDED_RELEASE_CAPSULE | Freq: Two times a day (BID) | ORAL | 0 refills | Status: AC | PRN

## 2020-10-21 NOTE — Telephone Encounter (Signed)
I called and advised pt's daughter to let her know PA advised she could do one last refill on the Oxycodone ER she rethought the first message and advised bc she had been on the medication long term and pain management will follow up with her. Voiced understanding and was very thankful for the reconsideration

## 2020-10-21 NOTE — Telephone Encounter (Signed)
Can only do percocet

## 2020-10-21 NOTE — Telephone Encounter (Signed)
Changed my mind. I refilled both but this is the only refill I can do of the oxycodone ER

## 2020-10-21 NOTE — Telephone Encounter (Signed)
Also I had asked about robaxin refill ok to do this?

## 2020-10-21 NOTE — Telephone Encounter (Signed)
I called and sw pt's daughter to advise of message below. Also wanted to know if her mother could have one more refill of the Oxycodone  ER as she is not set up with her regular pain management doctor for several more weeks. [facility]. She also would like refill on robaxin. Please advise.

## 2020-10-22 ENCOUNTER — Telehealth: Payer: Self-pay

## 2020-10-22 NOTE — Telephone Encounter (Signed)
Peter from [facility] called he is requesting orders for patient to receive pt for 1w2, 2w3 CB:[phone]

## 2020-10-22 NOTE — Telephone Encounter (Signed)
Called and sw PT to advise verbal ok for orders as requested.

## 2020-11-05 ENCOUNTER — Encounter: Payer: Self-pay | Admitting: Orthopedic Surgery

## 2020-11-05 ENCOUNTER — Ambulatory Visit (INDEPENDENT_AMBULATORY_CARE_PROVIDER_SITE_OTHER): Payer: Medicare Other | Admitting: Physician Assistant

## 2020-11-05 VITALS — Ht 63.25 in | Wt 307.0 lb

## 2020-11-05 DIAGNOSIS — S88119A Complete traumatic amputation at level between knee and ankle, unspecified lower leg, initial encounter: Secondary | ICD-10-CM

## 2020-11-05 MED ORDER — METHOCARBAMOL 500 MG PO TABS: 500.0000 mg | ORAL_TABLET | Freq: Four times a day (QID) | ORAL | 0 refills | Status: DC | PRN

## 2020-11-05 NOTE — Progress Notes (Signed)
Office Visit Note   Patient: Yesenia Payne           Date of Birth: January 06, 1951           MRN: [id number] Visit Date: 11/05/2020              Requested by: Merri Brunette, MD [phone] [address] [place],  [place] [address] PCP: Merri Brunette, MD  Chief Complaint  Patient presents with  . Left Leg - Routine Post Op    10/06/2020 Left BKA      HPI: Patient is 1 month status post left below-knee amputation.  She is being cared for by her daughter and home health at home.  She is doing well.  She is doing extremely well with her current pain management her daughter is only requesting a refill of her Robaxin  Assessment & Plan: Visit Diagnoses: No diagnosis found.  Plan: Follow-up in 2 weeks.  She has been provided a prescription for prosthetic and shrinkers with [employer] Patient is a new left transtibial  amputee.  Patient's current comorbidities are not expected to impact the ability to function with the prescribed prosthesis. Patient verbally communicates a strong desire to use a prosthesis. Patient currently requires mobility aids to ambulate without a prosthesis.  Expects not to use mobility aids with a new prosthesis.  Patient is a K2 level ambulator that will use a prosthesis to walk around their home and the community over low level environmental barriers.     Follow-Up Instructions: No follow-ups on file.   Ortho Exam  Patient is alert, oriented, no adenopathy, well-dressed, normal affect, normal respiratory effort. Examination well apposed wound edges no drainage no cellulitis.  Sutures and staples are in place.  Swelling is well controlled.  No evidence of any infection Imaging: No results found. No images are attached to the encounter.  Labs: Lab Results  Component Value Date   HGBA1C 7.2 (H) 10/04/2020   ESRSEDRATE 105 (H) 10/04/2020   CRP 26.7 (H) 10/04/2020   REPTSTATUS 10/12/2020 FINAL 10/07/2020   GRAMSTAIN  10/04/2020    NO WBC  SEEN MODERATE GRAM POSITIVE COCCI IN PAIRS MODERATE GRAM NEGATIVE RODS Performed at [facility], [address], [place], [place] [address]    CULT  10/07/2020    NO GROWTH 5 DAYS Performed at [facility], [address], [place], [place] [address]    LABORGA PROTEUS MIRABILIS 10/04/2020   LABORGA STAPHYLOCOCCUS PSEUDINTERMEDIUS 10/04/2020     Lab Results  Component Value Date   ALBUMIN 1.6 (L) 10/13/2020   ALBUMIN 1.6 (L) 10/12/2020   ALBUMIN 1.5 (L) 10/11/2020    Lab Results  Component Value Date   MG 1.7 10/14/2020   MG 1.9 10/13/2020   MG 1.9 10/12/2020   No results found for: VD25OH  No results found for: PREALBUMIN CBC EXTENDED Latest Ref Rng & Units 10/14/2020 10/13/2020 10/12/2020  WBC 4.0 - 10.5 K/uL 9.9 7.7 9.5  RBC 3.87 - 5.11 MIL/uL 3.74(L) 3.59(L) 3.74(L)  HGB 12.0 - 15.0 g/dL 10.3(L) 10.2(L) 10.7(L)  HCT 36.0 - 46.0 % 34.0(L) 32.1(L) 33.4(L)  PLT 150 - 400 K/uL 330 306 301  NEUTROABS 1.7 - 7.7 K/uL 7.5 5.4 7.1  LYMPHSABS 0.7 - 4.0 K/uL 1.5 1.4 1.3     Body mass index is 53.95 kg/m.  Orders:  No orders of the defined types were placed in this encounter.  No orders of the defined types were placed in this  encounter.    Procedures: No procedures performed  Clinical Data: No additional findings.  ROS:  All other systems negative, except as noted in the HPI. Review of Systems  Objective: Vital Signs: Ht 5' 3.25" (1.607 m)   Wt (!) 307 lb (139.3 kg)   BMI 53.95 kg/m   Specialty Comments:  No specialty comments available.  PMFS History: Patient Active Problem List   Diagnosis Date Noted  . Morbidly obese (HCC) 10/09/2020  . Sepsis (HCC) 10/05/2020  . Pressure injury of skin 10/05/2020  . Cutaneous abscess of left foot   . Anemia   . Necrotizing cellulitis 10/04/2020  . DOE (dyspnea on exertion) 01/18/2017  . Benign essential HTN 01/18/2017  . PAC (premature atrial contraction) 01/18/2017   Past Medical History:   Diagnosis Date  . Acid reflux   . Asthma   . Chronic headache   . Diabetes mellitus without complication (HCC)   . Diverticular disease   . Diverticulitis   . Dyslipidemia   . Fibromyalgia   . Hyperlipidemia   . Hypertension   . IBS (irritable bowel syndrome)   . Insomnia   . Irregular heartbeat   . Low back pain   . Morbid obesity (HCC)   . Neuropathy    distal peripheral  . Osteoarthritis   . PAC (premature atrial contraction) 01/18/2017  . Urinary incontinence   . Vitamin D deficiency     Family History  Problem Relation Age of Onset  . Hypertension Other   . Hypertension Mother   . Hyperlipidemia Mother   . Heart attack Father   . Diabetes Father   . CAD Father   . Neuropathy Father   . Diabetes Paternal Grandfather   . Cancer - Other Sister        uterine cancer  . Mental retardation Brother   . Leukemia Sister   . Heart attack Sister   . Other Daughter        ruptured colon x2    Past Surgical History:  Procedure Laterality Date  . AMPUTATION Left 10/06/2020   Procedure: AMPUTATION BELOW KNEE;  Surgeon: [name], MD;  Location: [facility] OR;  Service: Orthopedics;  Laterality: Left;  . arthoscopics    . BACK SURGERY     L4 & L5  . CESAREAN SECTION    . CESAREAN SECTION    . colonscopy  2009  . Left Knee Surgery  1993  . Right knee surgery  1990  . TOTAL KNEE ARTHROPLASTY     Social History   Occupational History  . Not on file  Tobacco Use  . Smoking status: Never Smoker  . Smokeless tobacco: Never Used  Substance and Sexual Activity  . Alcohol use: No    Alcohol/week: 0.0 standard drinks  . Drug use: No  . Sexual activity: Never

## 2020-11-11 DIAGNOSIS — I10 Essential (primary) hypertension: Secondary | ICD-10-CM | POA: Diagnosis not present

## 2020-11-11 DIAGNOSIS — L8990 Pressure ulcer of unspecified site, unspecified stage: Secondary | ICD-10-CM | POA: Diagnosis not present

## 2020-11-11 DIAGNOSIS — I872 Venous insufficiency (chronic) (peripheral): Secondary | ICD-10-CM | POA: Diagnosis not present

## 2020-11-11 DIAGNOSIS — N179 Acute kidney failure, unspecified: Secondary | ICD-10-CM | POA: Diagnosis not present

## 2020-11-11 DIAGNOSIS — Z452 Encounter for adjustment and management of vascular access device: Secondary | ICD-10-CM | POA: Diagnosis not present

## 2020-11-11 DIAGNOSIS — I4891 Unspecified atrial fibrillation: Secondary | ICD-10-CM | POA: Diagnosis not present

## 2020-11-11 DIAGNOSIS — R6521 Severe sepsis with septic shock: Secondary | ICD-10-CM | POA: Diagnosis not present

## 2020-11-11 DIAGNOSIS — L89150 Pressure ulcer of sacral region, unstageable: Secondary | ICD-10-CM | POA: Diagnosis not present

## 2020-11-11 DIAGNOSIS — E1142 Type 2 diabetes mellitus with diabetic polyneuropathy: Secondary | ICD-10-CM | POA: Diagnosis not present

## 2020-11-11 DIAGNOSIS — L02612 Cutaneous abscess of left foot: Secondary | ICD-10-CM | POA: Diagnosis not present

## 2020-11-11 DIAGNOSIS — Z4781 Encounter for orthopedic aftercare following surgical amputation: Secondary | ICD-10-CM | POA: Diagnosis not present

## 2020-11-11 DIAGNOSIS — E785 Hyperlipidemia, unspecified: Secondary | ICD-10-CM | POA: Diagnosis not present

## 2020-11-11 DIAGNOSIS — A4189 Other specified sepsis: Secondary | ICD-10-CM | POA: Diagnosis not present

## 2020-11-11 DIAGNOSIS — L03116 Cellulitis of left lower limb: Secondary | ICD-10-CM | POA: Diagnosis not present

## 2020-11-11 DIAGNOSIS — E1151 Type 2 diabetes mellitus with diabetic peripheral angiopathy without gangrene: Secondary | ICD-10-CM | POA: Diagnosis not present

## 2020-11-13 ENCOUNTER — Telehealth: Payer: Self-pay | Admitting: *Deleted

## 2020-11-13 DIAGNOSIS — E1151 Type 2 diabetes mellitus with diabetic peripheral angiopathy without gangrene: Secondary | ICD-10-CM | POA: Diagnosis not present

## 2020-11-13 DIAGNOSIS — R6521 Severe sepsis with septic shock: Secondary | ICD-10-CM | POA: Diagnosis not present

## 2020-11-13 DIAGNOSIS — I872 Venous insufficiency (chronic) (peripheral): Secondary | ICD-10-CM | POA: Diagnosis not present

## 2020-11-13 DIAGNOSIS — L89150 Pressure ulcer of sacral region, unstageable: Secondary | ICD-10-CM | POA: Diagnosis not present

## 2020-11-13 DIAGNOSIS — Z452 Encounter for adjustment and management of vascular access device: Secondary | ICD-10-CM | POA: Diagnosis not present

## 2020-11-13 DIAGNOSIS — A4189 Other specified sepsis: Secondary | ICD-10-CM | POA: Diagnosis not present

## 2020-11-13 DIAGNOSIS — E1142 Type 2 diabetes mellitus with diabetic polyneuropathy: Secondary | ICD-10-CM | POA: Diagnosis not present

## 2020-11-13 DIAGNOSIS — I4891 Unspecified atrial fibrillation: Secondary | ICD-10-CM | POA: Diagnosis not present

## 2020-11-13 DIAGNOSIS — I10 Essential (primary) hypertension: Secondary | ICD-10-CM | POA: Diagnosis not present

## 2020-11-13 DIAGNOSIS — N179 Acute kidney failure, unspecified: Secondary | ICD-10-CM | POA: Diagnosis not present

## 2020-11-13 DIAGNOSIS — E785 Hyperlipidemia, unspecified: Secondary | ICD-10-CM | POA: Diagnosis not present

## 2020-11-13 DIAGNOSIS — Z4781 Encounter for orthopedic aftercare following surgical amputation: Secondary | ICD-10-CM | POA: Diagnosis not present

## 2020-11-13 DIAGNOSIS — L03116 Cellulitis of left lower limb: Secondary | ICD-10-CM | POA: Diagnosis not present

## 2020-11-13 NOTE — Telephone Encounter (Signed)
I called to advise that we would need to see pt in the office for eval. Pt has been advised to float the heel while at rest per nurse and that this is actually looking better. Avoiding all pressure. Pt has an appt on 11/18/20 advised will call if needs to move the appt up. Wants to know if at appt she can get a referral for sacral wound. Advised will address all at appt and nurse feels that the pt is stable to wait until then. Will call with any other questions.

## 2020-11-13 NOTE — Telephone Encounter (Signed)
Home health nurse called stating pt has injury to R heel, had deep tissue bruise, no open areas. Daughter wants to know what your suggestions are in taking care of the injury. Please advise.   CB [phone]

## 2020-11-14 DIAGNOSIS — E785 Hyperlipidemia, unspecified: Secondary | ICD-10-CM | POA: Diagnosis not present

## 2020-11-14 DIAGNOSIS — I872 Venous insufficiency (chronic) (peripheral): Secondary | ICD-10-CM | POA: Diagnosis not present

## 2020-11-14 DIAGNOSIS — R6521 Severe sepsis with septic shock: Secondary | ICD-10-CM | POA: Diagnosis not present

## 2020-11-14 DIAGNOSIS — Z452 Encounter for adjustment and management of vascular access device: Secondary | ICD-10-CM | POA: Diagnosis not present

## 2020-11-14 DIAGNOSIS — K219 Gastro-esophageal reflux disease without esophagitis: Secondary | ICD-10-CM | POA: Diagnosis not present

## 2020-11-14 DIAGNOSIS — E1142 Type 2 diabetes mellitus with diabetic polyneuropathy: Secondary | ICD-10-CM | POA: Diagnosis not present

## 2020-11-14 DIAGNOSIS — E1151 Type 2 diabetes mellitus with diabetic peripheral angiopathy without gangrene: Secondary | ICD-10-CM | POA: Diagnosis not present

## 2020-11-14 DIAGNOSIS — A4189 Other specified sepsis: Secondary | ICD-10-CM | POA: Diagnosis not present

## 2020-11-14 DIAGNOSIS — L8990 Pressure ulcer of unspecified site, unspecified stage: Secondary | ICD-10-CM | POA: Diagnosis not present

## 2020-11-14 DIAGNOSIS — I4891 Unspecified atrial fibrillation: Secondary | ICD-10-CM | POA: Diagnosis not present

## 2020-11-14 DIAGNOSIS — L03116 Cellulitis of left lower limb: Secondary | ICD-10-CM | POA: Diagnosis not present

## 2020-11-14 DIAGNOSIS — L89150 Pressure ulcer of sacral region, unstageable: Secondary | ICD-10-CM | POA: Diagnosis not present

## 2020-11-14 DIAGNOSIS — I10 Essential (primary) hypertension: Secondary | ICD-10-CM | POA: Diagnosis not present

## 2020-11-14 DIAGNOSIS — N179 Acute kidney failure, unspecified: Secondary | ICD-10-CM | POA: Diagnosis not present

## 2020-11-14 DIAGNOSIS — Z4781 Encounter for orthopedic aftercare following surgical amputation: Secondary | ICD-10-CM | POA: Diagnosis not present

## 2020-11-18 ENCOUNTER — Ambulatory Visit (INDEPENDENT_AMBULATORY_CARE_PROVIDER_SITE_OTHER): Payer: Medicare Other | Admitting: Orthopedic Surgery

## 2020-11-18 ENCOUNTER — Encounter: Payer: Self-pay | Admitting: Orthopedic Surgery

## 2020-11-18 DIAGNOSIS — S88919A Complete traumatic amputation of unspecified lower leg, level unspecified, initial encounter: Secondary | ICD-10-CM | POA: Diagnosis not present

## 2020-11-18 DIAGNOSIS — Z89512 Acquired absence of left leg below knee: Secondary | ICD-10-CM

## 2020-11-18 DIAGNOSIS — Z743 Need for continuous supervision: Secondary | ICD-10-CM | POA: Diagnosis not present

## 2020-11-18 DIAGNOSIS — S88119A Complete traumatic amputation at level between knee and ankle, unspecified lower leg, initial encounter: Secondary | ICD-10-CM

## 2020-11-18 DIAGNOSIS — R0902 Hypoxemia: Secondary | ICD-10-CM | POA: Diagnosis not present

## 2020-11-18 DIAGNOSIS — R279 Unspecified lack of coordination: Secondary | ICD-10-CM | POA: Diagnosis not present

## 2020-11-18 MED ORDER — METHOCARBAMOL 500 MG PO TABS: 500.0000 mg | ORAL_TABLET | Freq: Four times a day (QID) | ORAL | 0 refills | Status: DC | PRN

## 2020-11-18 NOTE — Progress Notes (Signed)
Office Visit Note   Patient: Yesenia Payne           Date of Birth: 1951-05-12           MRN: [id number] Visit Date: 11/18/2020              Requested by: Merri Brunette, MD [phone] [address],  [place] [address] PCP: Merri Brunette, MD  Chief Complaint  Patient presents with  . Left Leg - Routine Post Op    10/06/20 left BKA       HPI: Patient is a 70 year old woman status post left below-knee amputation about 6 weeks ago.  Assessment & Plan: Visit Diagnoses:  1. Below knee amputation (HCC)     Plan: A refill prescription for Robaxin was provided.  Paperwork was completed for patient to be transported by EMS patient is unable to get into a car unable to sit in a wheelchair.  EMS transport is medically necessary.  Follow-Up Instructions: Return in about 4 weeks (around 12/16/2020).   Ortho Exam  Patient is alert, oriented, no adenopathy, well-dressed, normal affect, normal respiratory effort. Examination patient's residual limb is healing nicely there is some hypergranulation tissue this was touched with silver nitrate there is no deep wound there is no cellulitis the wound bed has essentially 100% healthy granulation tissue.  There is no sign of infection.  Imaging: No results found. No images are attached to the encounter.  Labs: Lab Results  Component Value Date   HGBA1C 7.2 (H) 10/04/2020   ESRSEDRATE 105 (H) 10/04/2020   CRP 26.7 (H) 10/04/2020   REPTSTATUS 10/12/2020 FINAL 10/07/2020   GRAMSTAIN  10/04/2020    NO WBC SEEN MODERATE GRAM POSITIVE COCCI IN PAIRS MODERATE GRAM NEGATIVE RODS Performed at [facility], [address] [place], [place] [address]    CULT  10/07/2020    NO GROWTH 5 DAYS Performed at [facility], [address], [place], [place] [address]    LABORGA PROTEUS MIRABILIS 10/04/2020   LABORGA STAPHYLOCOCCUS PSEUDINTERMEDIUS 10/04/2020     Lab Results  Component Value Date   ALBUMIN 1.6 (L)  10/13/2020   ALBUMIN 1.6 (L) 10/12/2020   ALBUMIN 1.5 (L) 10/11/2020    Lab Results  Component Value Date   MG 1.7 10/14/2020   MG 1.9 10/13/2020   MG 1.9 10/12/2020   No results found for: VD25OH  No results found for: PREALBUMIN CBC EXTENDED Latest Ref Rng & Units 10/14/2020 10/13/2020 10/12/2020  WBC 4.0 - 10.5 K/uL 9.9 7.7 9.5  RBC 3.87 - 5.11 MIL/uL 3.74(L) 3.59(L) 3.74(L)  HGB 12.0 - 15.0 g/dL 10.3(L) 10.2(L) 10.7(L)  HCT 36.0 - 46.0 % 34.0(L) 32.1(L) 33.4(L)  PLT 150 - 400 K/uL 330 306 301  NEUTROABS 1.7 - 7.7 K/uL 7.5 5.4 7.1  LYMPHSABS 0.7 - 4.0 K/uL 1.5 1.4 1.3     There is no height or weight on file to calculate BMI.  Orders:  No orders of the defined types were placed in this encounter.  Meds ordered this encounter  Medications  . methocarbamol (ROBAXIN) 500 MG tablet    Sig: Take 1 tablet (500 mg total) by mouth every 6 (six) hours as needed for muscle spasms.    Dispense:  30 tablet    Refill:  0     Procedures: No procedures performed  Clinical Data: No additional findings.  ROS:  All other systems negative, except as noted in the HPI. Review of  Systems  Objective: Vital Signs: There were no vitals taken for this visit.  Specialty Comments:  No specialty comments available.  PMFS History: Patient Active Problem List   Diagnosis Date Noted  . Morbidly obese (HCC) 10/09/2020  . Sepsis (HCC) 10/05/2020  . Pressure injury of skin 10/05/2020  . Cutaneous abscess of left foot   . Anemia   . Necrotizing cellulitis 10/04/2020  . DOE (dyspnea on exertion) 01/18/2017  . Benign essential HTN 01/18/2017  . PAC (premature atrial contraction) 01/18/2017   Past Medical History:  Diagnosis Date  . Acid reflux   . Asthma   . Chronic headache   . Diabetes mellitus without complication (HCC)   . Diverticular disease   . Diverticulitis   . Dyslipidemia   . Fibromyalgia   . Hyperlipidemia   . Hypertension   . IBS (irritable bowel syndrome)   .  Insomnia   . Irregular heartbeat   . Low back pain   . Morbid obesity (HCC)   . Neuropathy    distal peripheral  . Osteoarthritis   . PAC (premature atrial contraction) 01/18/2017  . Urinary incontinence   . Vitamin D deficiency     Family History  Problem Relation Age of Onset  . Hypertension Other   . Hypertension Mother   . Hyperlipidemia Mother   . Heart attack Father   . Diabetes Father   . CAD Father   . Neuropathy Father   . Diabetes Paternal Grandfather   . Cancer - Other Sister        uterine cancer  . Mental retardation Brother   . Leukemia Sister   . Heart attack Sister   . Other Daughter        ruptured colon x2    Past Surgical History:  Procedure Laterality Date  . AMPUTATION Left 10/06/2020   Procedure: AMPUTATION BELOW KNEE;  Surgeon: [name], MD;  Location: [facility] OR;  Service: Orthopedics;  Laterality: Left;  . arthoscopics    . BACK SURGERY     L4 & L5  . CESAREAN SECTION    . CESAREAN SECTION    . colonscopy  2009  . Left Knee Surgery  1993  . Right knee surgery  1990  . TOTAL KNEE ARTHROPLASTY     Social History   Occupational History  . Not on file  Tobacco Use  . Smoking status: Never Smoker  . Smokeless tobacco: Never Used  Substance and Sexual Activity  . Alcohol use: No    Alcohol/week: 0.0 standard drinks  . Drug use: No  . Sexual activity: Never

## 2020-11-21 DIAGNOSIS — L03116 Cellulitis of left lower limb: Secondary | ICD-10-CM | POA: Diagnosis not present

## 2020-11-21 DIAGNOSIS — Z452 Encounter for adjustment and management of vascular access device: Secondary | ICD-10-CM | POA: Diagnosis not present

## 2020-11-21 DIAGNOSIS — R6521 Severe sepsis with septic shock: Secondary | ICD-10-CM | POA: Diagnosis not present

## 2020-11-21 DIAGNOSIS — I872 Venous insufficiency (chronic) (peripheral): Secondary | ICD-10-CM | POA: Diagnosis not present

## 2020-11-21 DIAGNOSIS — I10 Essential (primary) hypertension: Secondary | ICD-10-CM | POA: Diagnosis not present

## 2020-11-21 DIAGNOSIS — A4189 Other specified sepsis: Secondary | ICD-10-CM | POA: Diagnosis not present

## 2020-11-21 DIAGNOSIS — E785 Hyperlipidemia, unspecified: Secondary | ICD-10-CM | POA: Diagnosis not present

## 2020-11-21 DIAGNOSIS — E1142 Type 2 diabetes mellitus with diabetic polyneuropathy: Secondary | ICD-10-CM | POA: Diagnosis not present

## 2020-11-21 DIAGNOSIS — Z4781 Encounter for orthopedic aftercare following surgical amputation: Secondary | ICD-10-CM | POA: Diagnosis not present

## 2020-11-21 DIAGNOSIS — L89150 Pressure ulcer of sacral region, unstageable: Secondary | ICD-10-CM | POA: Diagnosis not present

## 2020-11-21 DIAGNOSIS — E1151 Type 2 diabetes mellitus with diabetic peripheral angiopathy without gangrene: Secondary | ICD-10-CM | POA: Diagnosis not present

## 2020-11-21 DIAGNOSIS — N179 Acute kidney failure, unspecified: Secondary | ICD-10-CM | POA: Diagnosis not present

## 2020-11-21 DIAGNOSIS — I4891 Unspecified atrial fibrillation: Secondary | ICD-10-CM | POA: Diagnosis not present

## 2020-11-28 ENCOUNTER — Encounter: Payer: Self-pay | Admitting: Orthopedic Surgery

## 2020-11-28 DIAGNOSIS — N390 Urinary tract infection, site not specified: Secondary | ICD-10-CM | POA: Diagnosis not present

## 2020-11-28 DIAGNOSIS — Z452 Encounter for adjustment and management of vascular access device: Secondary | ICD-10-CM | POA: Diagnosis not present

## 2020-11-28 DIAGNOSIS — I872 Venous insufficiency (chronic) (peripheral): Secondary | ICD-10-CM | POA: Diagnosis not present

## 2020-11-28 DIAGNOSIS — N179 Acute kidney failure, unspecified: Secondary | ICD-10-CM | POA: Diagnosis not present

## 2020-11-28 DIAGNOSIS — Z4781 Encounter for orthopedic aftercare following surgical amputation: Secondary | ICD-10-CM | POA: Diagnosis not present

## 2020-11-28 DIAGNOSIS — A4189 Other specified sepsis: Secondary | ICD-10-CM | POA: Diagnosis not present

## 2020-11-28 DIAGNOSIS — R6521 Severe sepsis with septic shock: Secondary | ICD-10-CM | POA: Diagnosis not present

## 2020-11-28 DIAGNOSIS — E1151 Type 2 diabetes mellitus with diabetic peripheral angiopathy without gangrene: Secondary | ICD-10-CM | POA: Diagnosis not present

## 2020-11-28 DIAGNOSIS — L89150 Pressure ulcer of sacral region, unstageable: Secondary | ICD-10-CM | POA: Diagnosis not present

## 2020-11-28 DIAGNOSIS — I10 Essential (primary) hypertension: Secondary | ICD-10-CM | POA: Diagnosis not present

## 2020-11-28 DIAGNOSIS — E785 Hyperlipidemia, unspecified: Secondary | ICD-10-CM | POA: Diagnosis not present

## 2020-11-28 DIAGNOSIS — L03116 Cellulitis of left lower limb: Secondary | ICD-10-CM | POA: Diagnosis not present

## 2020-11-28 DIAGNOSIS — E1142 Type 2 diabetes mellitus with diabetic polyneuropathy: Secondary | ICD-10-CM | POA: Diagnosis not present

## 2020-11-28 DIAGNOSIS — I4891 Unspecified atrial fibrillation: Secondary | ICD-10-CM | POA: Diagnosis not present

## 2020-12-03 DIAGNOSIS — M5030 Other cervical disc degeneration, unspecified cervical region: Secondary | ICD-10-CM | POA: Diagnosis not present

## 2020-12-03 DIAGNOSIS — G894 Chronic pain syndrome: Secondary | ICD-10-CM | POA: Diagnosis not present

## 2020-12-03 DIAGNOSIS — E1142 Type 2 diabetes mellitus with diabetic polyneuropathy: Secondary | ICD-10-CM | POA: Diagnosis not present

## 2020-12-03 DIAGNOSIS — M961 Postlaminectomy syndrome, not elsewhere classified: Secondary | ICD-10-CM | POA: Diagnosis not present

## 2020-12-04 ENCOUNTER — Telehealth: Payer: Self-pay | Admitting: Orthopedic Surgery

## 2020-12-04 NOTE — Telephone Encounter (Signed)
Verbal order given  

## 2020-12-04 NOTE — Telephone Encounter (Signed)
Received call from [place] (PT) with [facility] needing orders to extend PT  1 Wk 5.  The number to contact Theron Arista is [phone]

## 2020-12-05 ENCOUNTER — Telehealth: Payer: Self-pay

## 2020-12-05 DIAGNOSIS — L89150 Pressure ulcer of sacral region, unstageable: Secondary | ICD-10-CM | POA: Diagnosis not present

## 2020-12-05 DIAGNOSIS — A4189 Other specified sepsis: Secondary | ICD-10-CM | POA: Diagnosis not present

## 2020-12-05 DIAGNOSIS — N179 Acute kidney failure, unspecified: Secondary | ICD-10-CM | POA: Diagnosis not present

## 2020-12-05 DIAGNOSIS — R6521 Severe sepsis with septic shock: Secondary | ICD-10-CM | POA: Diagnosis not present

## 2020-12-05 DIAGNOSIS — E1142 Type 2 diabetes mellitus with diabetic polyneuropathy: Secondary | ICD-10-CM | POA: Diagnosis not present

## 2020-12-05 DIAGNOSIS — E785 Hyperlipidemia, unspecified: Secondary | ICD-10-CM | POA: Diagnosis not present

## 2020-12-05 DIAGNOSIS — E1151 Type 2 diabetes mellitus with diabetic peripheral angiopathy without gangrene: Secondary | ICD-10-CM | POA: Diagnosis not present

## 2020-12-05 DIAGNOSIS — I4891 Unspecified atrial fibrillation: Secondary | ICD-10-CM | POA: Diagnosis not present

## 2020-12-05 DIAGNOSIS — Z452 Encounter for adjustment and management of vascular access device: Secondary | ICD-10-CM | POA: Diagnosis not present

## 2020-12-05 DIAGNOSIS — L03116 Cellulitis of left lower limb: Secondary | ICD-10-CM | POA: Diagnosis not present

## 2020-12-05 DIAGNOSIS — I10 Essential (primary) hypertension: Secondary | ICD-10-CM | POA: Diagnosis not present

## 2020-12-05 DIAGNOSIS — I872 Venous insufficiency (chronic) (peripheral): Secondary | ICD-10-CM | POA: Diagnosis not present

## 2020-12-05 DIAGNOSIS — Z4781 Encounter for orthopedic aftercare following surgical amputation: Secondary | ICD-10-CM | POA: Diagnosis not present

## 2020-12-05 MED ORDER — METHOCARBAMOL 500 MG PO TABS: 500.0000 mg | ORAL_TABLET | Freq: Four times a day (QID) | ORAL | 0 refills | Status: DC | PRN

## 2020-12-05 NOTE — Addendum Note (Signed)
Addended by: Rogers Seeds on: 12/05/2020 05:16 PM   Modules accepted: Orders

## 2020-12-05 NOTE — Telephone Encounter (Signed)
Patient daughter called she is requesting rx refill for robaxin. [phone]

## 2020-12-05 NOTE — Telephone Encounter (Signed)
Can you please advise since Dr. Duda is out of the office? °

## 2020-12-05 NOTE — Telephone Encounter (Signed)
OK refill thanks 

## 2020-12-05 NOTE — Telephone Encounter (Signed)
Sent to [facility]. I called patient's daughter and advised.

## 2020-12-08 ENCOUNTER — Telehealth: Payer: Self-pay | Admitting: Orthopedic Surgery

## 2020-12-08 NOTE — Telephone Encounter (Signed)
Pt is s/p a BKA on 10/06/20 I called and sw pt's daughter and she states that the pt had a UA obtained last week and it is positive for infection and that she also has a yeast infection and is requesting treatment for this an ABX for UTI. I advised that she should consult her PCP for this and pt's daughter states that she does not have one and that Dr. Lajoyce Corners gave approval for UA to be obtained and said that he would treat ( Dr. Lajoyce Corners has not been in the office and there is no note that states ok for UA) advised that we do not have the results and that Dr. Lajoyce Corners is not in the office this afternoon I will hold to discuss with him tomorrow if she can have results sent to the office. The pt's daughter states that transportation has been an issue and she has not been able to establish pt with pcp. I advised that this needs follow up with a primary doctor and that I will refer message to Dr. Lajoyce Corners tomorrow.

## 2020-12-08 NOTE — Telephone Encounter (Signed)
Patient's daughter called. Says she has a UTI per home health nurse. Would like an antibiotic called in for that. Her call back number is [phone]

## 2020-12-09 ENCOUNTER — Telehealth: Payer: Self-pay

## 2020-12-09 DIAGNOSIS — N179 Acute kidney failure, unspecified: Secondary | ICD-10-CM | POA: Diagnosis not present

## 2020-12-09 DIAGNOSIS — L89150 Pressure ulcer of sacral region, unstageable: Secondary | ICD-10-CM | POA: Diagnosis not present

## 2020-12-09 DIAGNOSIS — E1151 Type 2 diabetes mellitus with diabetic peripheral angiopathy without gangrene: Secondary | ICD-10-CM | POA: Diagnosis not present

## 2020-12-09 DIAGNOSIS — I4891 Unspecified atrial fibrillation: Secondary | ICD-10-CM | POA: Diagnosis not present

## 2020-12-09 DIAGNOSIS — I872 Venous insufficiency (chronic) (peripheral): Secondary | ICD-10-CM | POA: Diagnosis not present

## 2020-12-09 DIAGNOSIS — A4189 Other specified sepsis: Secondary | ICD-10-CM | POA: Diagnosis not present

## 2020-12-09 DIAGNOSIS — R6521 Severe sepsis with septic shock: Secondary | ICD-10-CM | POA: Diagnosis not present

## 2020-12-09 DIAGNOSIS — Z4781 Encounter for orthopedic aftercare following surgical amputation: Secondary | ICD-10-CM | POA: Diagnosis not present

## 2020-12-09 DIAGNOSIS — E1142 Type 2 diabetes mellitus with diabetic polyneuropathy: Secondary | ICD-10-CM | POA: Diagnosis not present

## 2020-12-09 DIAGNOSIS — I10 Essential (primary) hypertension: Secondary | ICD-10-CM | POA: Diagnosis not present

## 2020-12-09 DIAGNOSIS — L03116 Cellulitis of left lower limb: Secondary | ICD-10-CM | POA: Diagnosis not present

## 2020-12-09 DIAGNOSIS — Z452 Encounter for adjustment and management of vascular access device: Secondary | ICD-10-CM | POA: Diagnosis not present

## 2020-12-09 DIAGNOSIS — E785 Hyperlipidemia, unspecified: Secondary | ICD-10-CM | POA: Diagnosis not present

## 2020-12-09 NOTE — Telephone Encounter (Signed)
Lets get the sensitivities to the culture and we will call in an antibiotic.

## 2020-12-09 NOTE — Telephone Encounter (Signed)
Please see below.

## 2020-12-09 NOTE — Telephone Encounter (Signed)
STARLA would like nystatin powder sent in for PT  And would like to advise that  Her culture came back that she can Pseudomonas aeruginosan in her urine

## 2020-12-09 NOTE — Telephone Encounter (Signed)
Message was left on vm for HHn to advise to fax results and call me with questions. Will hold pending fax.

## 2020-12-10 ENCOUNTER — Telehealth: Payer: Self-pay

## 2020-12-10 ENCOUNTER — Other Ambulatory Visit: Payer: Self-pay | Admitting: Physician Assistant

## 2020-12-10 MED ORDER — SULFAMETHOXAZOLE-TRIMETHOPRIM 800-160 MG PO TABS: 1.0000 | ORAL_TABLET | Freq: Two times a day (BID) | ORAL | 0 refills | Status: DC

## 2020-12-10 MED ORDER — NYSTATIN 100000 UNIT/GM EX POWD: 1.0000 "application " | Freq: Three times a day (TID) | CUTANEOUS | 0 refills | Status: AC

## 2020-12-10 NOTE — Telephone Encounter (Signed)
Left message for Yesenia Payne. Called in Bactrim and Nystatin. Patient will need to have follow up with PCP for UTI or ER if symptoms do not improve

## 2020-12-10 NOTE — Telephone Encounter (Signed)
This has been addressed.

## 2020-12-10 NOTE — Telephone Encounter (Signed)
I called and sw Yesenia Payne the pt's HHN pt is sensitive  to all but Cipro for her UTI and needs rx for abx and nystatin powder please. Also advised that the pt has an appt on 12/16/20 and will have Dr. Lajoyce Corners discuss that we can not take care of PCP on going treatment and medication refills she needs eval. Pt does need direction for heel ulcer and gluteal  ulcer using santyl and betadine currently and the pt's daughter changes what is used to treat every few days.documented this in the chart to address at office visit on Tuesday.

## 2020-12-10 NOTE — Telephone Encounter (Signed)
Spoke to the patient's daughter by phone today.  We did receive the printed sensitivities to the urine  culture.  Bactrim was not included.  Many of these medications were IV.  I spoke with the daughter that we did not order the UA.  Given her mother's age and multiple medical problems she was again told that her mother be needs to engage with a primary care physician.  She said that Dr. Lajoyce Corners would "help her out ".  It is now been over 2 months since her surgery and she still has not gotten her mother her primary care provider.  She said to me "transportation is difficult and am I supposed my supposed to do let my mother be home and die from a urinary tract infection?.  I told her that her mother needed to have this treated by an appropriate practitioner as well as managing her multiple other medical problems.  I recommended taking her mother to the emergency room for treatment or an urgent care until she can find a primary care provider.  She was under the assumption that Dr. Lajoyce Corners would manage her mother's diabetes and other medical problems because of her difficulty with transportation.  I explained to her the reason that this would not be appropriate.  She did understand.

## 2020-12-10 NOTE — Telephone Encounter (Signed)
labs for review

## 2020-12-11 ENCOUNTER — Encounter (HOSPITAL_COMMUNITY): Payer: Self-pay | Admitting: Emergency Medicine

## 2020-12-11 ENCOUNTER — Telehealth: Payer: Self-pay | Admitting: Orthopedic Surgery

## 2020-12-11 ENCOUNTER — Inpatient Hospital Stay (HOSPITAL_COMMUNITY)
Admission: EM | Admit: 2020-12-11 | Discharge: 2020-12-12 | DRG: 690 | Disposition: A | Discharge status: Home / Self Care | Payer: Medicare Other | Attending: Internal Medicine | Admitting: Internal Medicine

## 2020-12-11 DIAGNOSIS — E785 Hyperlipidemia, unspecified: Secondary | ICD-10-CM | POA: Diagnosis present

## 2020-12-11 DIAGNOSIS — Z6841 Body Mass Index (BMI) 40.0 and over, adult: Secondary | ICD-10-CM

## 2020-12-11 DIAGNOSIS — Z79899 Other long term (current) drug therapy: Secondary | ICD-10-CM | POA: Diagnosis not present

## 2020-12-11 DIAGNOSIS — Z7951 Long term (current) use of inhaled steroids: Secondary | ICD-10-CM | POA: Diagnosis not present

## 2020-12-11 DIAGNOSIS — M797 Fibromyalgia: Secondary | ICD-10-CM | POA: Diagnosis not present

## 2020-12-11 DIAGNOSIS — K219 Gastro-esophageal reflux disease without esophagitis: Secondary | ICD-10-CM | POA: Diagnosis present

## 2020-12-11 DIAGNOSIS — Z89512 Acquired absence of left leg below knee: Secondary | ICD-10-CM | POA: Diagnosis not present

## 2020-12-11 DIAGNOSIS — J45909 Unspecified asthma, uncomplicated: Secondary | ICD-10-CM | POA: Insufficient documentation

## 2020-12-11 DIAGNOSIS — L89619 Pressure ulcer of right heel, unspecified stage: Secondary | ICD-10-CM | POA: Diagnosis present

## 2020-12-11 DIAGNOSIS — B965 Pseudomonas (aeruginosa) (mallei) (pseudomallei) as the cause of diseases classified elsewhere: Secondary | ICD-10-CM | POA: Diagnosis present

## 2020-12-11 DIAGNOSIS — Z96653 Presence of artificial knee joint, bilateral: Secondary | ICD-10-CM | POA: Diagnosis not present

## 2020-12-11 DIAGNOSIS — N390 Urinary tract infection, site not specified: Principal | ICD-10-CM | POA: Diagnosis present

## 2020-12-11 DIAGNOSIS — Z794 Long term (current) use of insulin: Secondary | ICD-10-CM

## 2020-12-11 DIAGNOSIS — E1140 Type 2 diabetes mellitus with diabetic neuropathy, unspecified: Secondary | ICD-10-CM | POA: Diagnosis not present

## 2020-12-11 DIAGNOSIS — E1165 Type 2 diabetes mellitus with hyperglycemia: Secondary | ICD-10-CM | POA: Diagnosis present

## 2020-12-11 DIAGNOSIS — E8809 Other disorders of plasma-protein metabolism, not elsewhere classified: Secondary | ICD-10-CM | POA: Diagnosis not present

## 2020-12-11 DIAGNOSIS — L8990 Pressure ulcer of unspecified site, unspecified stage: Secondary | ICD-10-CM | POA: Diagnosis present

## 2020-12-11 DIAGNOSIS — Z888 Allergy status to other drugs, medicaments and biological substances status: Secondary | ICD-10-CM

## 2020-12-11 DIAGNOSIS — B373 Candidiasis of vulva and vagina: Secondary | ICD-10-CM | POA: Diagnosis not present

## 2020-12-11 DIAGNOSIS — G8929 Other chronic pain: Secondary | ICD-10-CM | POA: Diagnosis not present

## 2020-12-11 DIAGNOSIS — Z91041 Radiographic dye allergy status: Secondary | ICD-10-CM | POA: Diagnosis not present

## 2020-12-11 DIAGNOSIS — E119 Type 2 diabetes mellitus without complications: Secondary | ICD-10-CM | POA: Insufficient documentation

## 2020-12-11 DIAGNOSIS — R0902 Hypoxemia: Secondary | ICD-10-CM | POA: Diagnosis not present

## 2020-12-11 DIAGNOSIS — I10 Essential (primary) hypertension: Secondary | ICD-10-CM | POA: Diagnosis not present

## 2020-12-11 DIAGNOSIS — D72829 Elevated white blood cell count, unspecified: Secondary | ICD-10-CM | POA: Diagnosis present

## 2020-12-11 DIAGNOSIS — Z8249 Family history of ischemic heart disease and other diseases of the circulatory system: Secondary | ICD-10-CM

## 2020-12-11 DIAGNOSIS — Z20822 Contact with and (suspected) exposure to covid-19: Secondary | ICD-10-CM | POA: Diagnosis present

## 2020-12-11 DIAGNOSIS — Z743 Need for continuous supervision: Secondary | ICD-10-CM | POA: Diagnosis not present

## 2020-12-11 DIAGNOSIS — Z1623 Resistance to quinolones and fluoroquinolones: Secondary | ICD-10-CM | POA: Diagnosis not present

## 2020-12-11 DIAGNOSIS — Z833 Family history of diabetes mellitus: Secondary | ICD-10-CM

## 2020-12-11 LAB — URINALYSIS, ROUTINE W REFLEX MICROSCOPIC
Bilirubin Urine: NEGATIVE
Glucose, UA: NEGATIVE mg/dL
Ketones, ur: NEGATIVE mg/dL
Nitrite: NEGATIVE
Protein, ur: NEGATIVE mg/dL
Specific Gravity, Urine: 1.01 (ref 1.005–1.030)
pH: 7 (ref 5.0–8.0)

## 2020-12-11 LAB — CBC
HCT: 45.3 % (ref 36.0–46.0)
Hemoglobin: 14.7 g/dL (ref 12.0–15.0)
MCH: 28.9 pg (ref 26.0–34.0)
MCHC: 32.5 g/dL (ref 30.0–36.0)
MCV: 89 fL (ref 80.0–100.0)
Platelets: 277 10*3/uL (ref 150–400)
RBC: 5.09 MIL/uL (ref 3.87–5.11)
RDW: 14.8 % (ref 11.5–15.5)
WBC: 14.2 10*3/uL — ABNORMAL HIGH (ref 4.0–10.5)
nRBC: 0 % (ref 0.0–0.2)

## 2020-12-11 LAB — COMPREHENSIVE METABOLIC PANEL
ALT: 8 U/L (ref 0–44)
AST: 11 U/L — ABNORMAL LOW (ref 15–41)
Albumin: 2.9 g/dL — ABNORMAL LOW (ref 3.5–5.0)
Alkaline Phosphatase: 57 U/L (ref 38–126)
Anion gap: 14 (ref 5–15)
BUN: 12 mg/dL (ref 8–23)
CO2: 25 mmol/L (ref 22–32)
Calcium: 9.1 mg/dL (ref 8.9–10.3)
Chloride: 99 mmol/L (ref 98–111)
Creatinine, Ser: 0.64 mg/dL (ref 0.44–1.00)
GFR, Estimated: >60 mL/min (ref >60)
Glucose, Bld: 195 mg/dL — ABNORMAL HIGH (ref 70–99)
Potassium: 4 mmol/L (ref 3.5–5.1)
Sodium: 138 mmol/L (ref 135–145)
Total Bilirubin: 0.5 mg/dL (ref 0.3–1.2)
Total Protein: 7.4 g/dL (ref 6.5–8.1)

## 2020-12-11 LAB — LIPASE, BLOOD: Lipase: 21 U/L (ref 11–51)

## 2020-12-11 NOTE — Telephone Encounter (Signed)
Please call. Thank you. 

## 2020-12-11 NOTE — ED Triage Notes (Signed)
Pt arrives via [facility] ems from home after being told to come from a + urine culture , pt states this was 2 weeks ago and currently having no sxs.

## 2020-12-11 NOTE — Telephone Encounter (Signed)
Starla a nurse from Home health Care called about this pt. Please call her back [phone]

## 2020-12-11 NOTE — Telephone Encounter (Signed)
I spoke to Yesenia Payne.  She was contacted by Efraim Kaufmann the patient's daughter who insisted she call us with regards to an antibiotic for the urinary tract infection.  We did review our notes and we did review new PT orders we did not have any documentation of a UA order.  That being a side as I told the daughter yesterday most of the antibiotics to treat this particular bacteria are IV.  I recommended that she take her mother to the emergency room.  Mervin Kung has also recommended this.  She is also concerned because she states the patient is in a hospital bed all day and is developing pressure ulcers.  She is trying to arrange a home physician visit but patient's daughter has been resistant in the past.  Dr. Lajoyce Corners was aware of my conversation and was here when I spoke with Melissa yesterday.  Originally we had said we would help out with some of the pain medication orders for her chronic pain until Melissa could get in touch with her chronic pain management physician and we did do this.  I have no documentation that we would manage her complex medical problems.  Again emphasis on to take her mother to the emergency room for her urinary tract infection.  Also home health will make 1 last effort to see if they can get a home physician into the home.  They have said that the daughter has been difficult and they may need to withdraw care of the patient.

## 2020-12-12 DIAGNOSIS — Z96653 Presence of artificial knee joint, bilateral: Secondary | ICD-10-CM | POA: Diagnosis not present

## 2020-12-12 DIAGNOSIS — Z6841 Body Mass Index (BMI) 40.0 and over, adult: Secondary | ICD-10-CM | POA: Diagnosis not present

## 2020-12-12 DIAGNOSIS — Z89512 Acquired absence of left leg below knee: Secondary | ICD-10-CM

## 2020-12-12 DIAGNOSIS — Z8249 Family history of ischemic heart disease and other diseases of the circulatory system: Secondary | ICD-10-CM | POA: Diagnosis not present

## 2020-12-12 DIAGNOSIS — Z7951 Long term (current) use of inhaled steroids: Secondary | ICD-10-CM | POA: Diagnosis not present

## 2020-12-12 DIAGNOSIS — R5381 Other malaise: Secondary | ICD-10-CM | POA: Diagnosis not present

## 2020-12-12 DIAGNOSIS — E8809 Other disorders of plasma-protein metabolism, not elsewhere classified: Secondary | ICD-10-CM | POA: Diagnosis not present

## 2020-12-12 DIAGNOSIS — Z7401 Bed confinement status: Secondary | ICD-10-CM | POA: Diagnosis not present

## 2020-12-12 DIAGNOSIS — Z794 Long term (current) use of insulin: Secondary | ICD-10-CM | POA: Diagnosis not present

## 2020-12-12 DIAGNOSIS — E785 Hyperlipidemia, unspecified: Secondary | ICD-10-CM | POA: Diagnosis not present

## 2020-12-12 DIAGNOSIS — L02612 Cutaneous abscess of left foot: Secondary | ICD-10-CM | POA: Diagnosis not present

## 2020-12-12 DIAGNOSIS — B965 Pseudomonas (aeruginosa) (mallei) (pseudomallei) as the cause of diseases classified elsewhere: Secondary | ICD-10-CM | POA: Diagnosis not present

## 2020-12-12 DIAGNOSIS — L89624 Pressure ulcer of left heel, stage 4: Secondary | ICD-10-CM

## 2020-12-12 DIAGNOSIS — I10 Essential (primary) hypertension: Secondary | ICD-10-CM

## 2020-12-12 DIAGNOSIS — M797 Fibromyalgia: Secondary | ICD-10-CM | POA: Diagnosis not present

## 2020-12-12 DIAGNOSIS — L8990 Pressure ulcer of unspecified site, unspecified stage: Secondary | ICD-10-CM | POA: Diagnosis not present

## 2020-12-12 DIAGNOSIS — Z79899 Other long term (current) drug therapy: Secondary | ICD-10-CM | POA: Diagnosis not present

## 2020-12-12 DIAGNOSIS — Z20822 Contact with and (suspected) exposure to covid-19: Secondary | ICD-10-CM | POA: Diagnosis not present

## 2020-12-12 DIAGNOSIS — B373 Candidiasis of vulva and vagina: Secondary | ICD-10-CM | POA: Diagnosis not present

## 2020-12-12 DIAGNOSIS — M2550 Pain in unspecified joint: Secondary | ICD-10-CM | POA: Diagnosis not present

## 2020-12-12 DIAGNOSIS — D72829 Elevated white blood cell count, unspecified: Secondary | ICD-10-CM

## 2020-12-12 DIAGNOSIS — N390 Urinary tract infection, site not specified: Secondary | ICD-10-CM | POA: Diagnosis not present

## 2020-12-12 DIAGNOSIS — K219 Gastro-esophageal reflux disease without esophagitis: Secondary | ICD-10-CM | POA: Diagnosis not present

## 2020-12-12 DIAGNOSIS — Z91041 Radiographic dye allergy status: Secondary | ICD-10-CM | POA: Diagnosis not present

## 2020-12-12 DIAGNOSIS — Z888 Allergy status to other drugs, medicaments and biological substances status: Secondary | ICD-10-CM | POA: Diagnosis not present

## 2020-12-12 DIAGNOSIS — Z1623 Resistance to quinolones and fluoroquinolones: Secondary | ICD-10-CM | POA: Diagnosis not present

## 2020-12-12 DIAGNOSIS — G8929 Other chronic pain: Secondary | ICD-10-CM | POA: Diagnosis not present

## 2020-12-12 DIAGNOSIS — E1165 Type 2 diabetes mellitus with hyperglycemia: Secondary | ICD-10-CM | POA: Diagnosis not present

## 2020-12-12 DIAGNOSIS — L89619 Pressure ulcer of right heel, unspecified stage: Secondary | ICD-10-CM | POA: Diagnosis not present

## 2020-12-12 DIAGNOSIS — Z743 Need for continuous supervision: Secondary | ICD-10-CM | POA: Diagnosis not present

## 2020-12-12 DIAGNOSIS — E1140 Type 2 diabetes mellitus with diabetic neuropathy, unspecified: Secondary | ICD-10-CM | POA: Diagnosis not present

## 2020-12-12 LAB — CBG MONITORING, ED
Glucose-Capillary: 173 mg/dL — ABNORMAL HIGH (ref 70–99)
Glucose-Capillary: 230 mg/dL — ABNORMAL HIGH (ref 70–99)
Glucose-Capillary: 213 mg/dL — ABNORMAL HIGH (ref 70–99)

## 2020-12-12 LAB — LACTIC ACID, PLASMA: Lactic Acid, Venous: 1.3 mmol/L (ref 0.5–1.9)

## 2020-12-12 LAB — SARS CORONAVIRUS 2 (TAT 6-24 HRS): SARS Coronavirus 2: NEGATIVE

## 2020-12-12 MED ORDER — PANTOPRAZOLE SODIUM 40 MG PO TBEC
40.0000 mg | DELAYED_RELEASE_TABLET | Freq: Two times a day (BID) | ORAL | Status: DC
  Administered 2020-12-12 (×2): 40 mg via ORAL
  Filled 2020-12-12 (×2): qty 1

## 2020-12-12 MED ORDER — DOCUSATE SODIUM 100 MG PO CAPS
100.0000 mg | ORAL_CAPSULE | Freq: Two times a day (BID) | ORAL | Status: DC
Start: 2020-12-12 — End: 2020-12-13
  Administered 2020-12-12: 100 mg via ORAL
  Filled 2020-12-12: qty 1

## 2020-12-12 MED ORDER — ONDANSETRON HCL 4 MG PO TABS: 4.0000 mg | ORAL_TABLET | Freq: Four times a day (QID) | ORAL | Status: DC | PRN

## 2020-12-12 MED ORDER — COLLAGENASE 250 UNIT/GM EX OINT
TOPICAL_OINTMENT | Freq: Every day | CUTANEOUS | Status: DC
  Filled 2020-12-12: qty 30

## 2020-12-12 MED ORDER — GABAPENTIN 300 MG PO CAPS
300.0000 mg | ORAL_CAPSULE | Freq: Three times a day (TID) | ORAL | Status: DC
  Administered 2020-12-12 (×2): 300 mg via ORAL
  Filled 2020-12-12 (×2): qty 1

## 2020-12-12 MED ORDER — ALBUTEROL SULFATE (2.5 MG/3ML) 0.083% IN NEBU: 2.5000 mg | INHALATION_SOLUTION | Freq: Three times a day (TID) | RESPIRATORY_TRACT | Status: DC | PRN

## 2020-12-12 MED ORDER — DORZOLAMIDE HCL 2 % OP SOLN
1.0000 [drp] | Freq: Two times a day (BID) | OPHTHALMIC | Status: DC
  Filled 2020-12-12: qty 10

## 2020-12-12 MED ORDER — MAGNESIUM OXIDE 400 (241.3 MG) MG PO TABS
400.0000 mg | ORAL_TABLET | Freq: Every day | ORAL | Status: DC
  Administered 2020-12-12: 400 mg via ORAL
  Filled 2020-12-12 (×3): qty 1

## 2020-12-12 MED ORDER — FLUTICASONE PROPIONATE 50 MCG/ACT NA SUSP: 1.0000 | Freq: Two times a day (BID) | NASAL | Status: DC | PRN

## 2020-12-12 MED ORDER — OXYCODONE HCL ER 10 MG PO T12A
10.0000 mg | EXTENDED_RELEASE_TABLET | Freq: Two times a day (BID) | ORAL | Status: DC
  Administered 2020-12-12: 10 mg via ORAL
  Filled 2020-12-12: qty 1

## 2020-12-12 MED ORDER — INSULIN ASPART 100 UNIT/ML ~~LOC~~ SOLN
0.0000 [IU] | Freq: Three times a day (TID) | SUBCUTANEOUS | Status: DC
  Administered 2020-12-12: 2 [IU] via SUBCUTANEOUS

## 2020-12-12 MED ORDER — SUMATRIPTAN SUCCINATE 25 MG PO TABS
25.0000 mg | ORAL_TABLET | Freq: Every day | ORAL | Status: DC | PRN
  Filled 2020-12-12: qty 1

## 2020-12-12 MED ORDER — LEVOFLOXACIN 500 MG PO TABS: 500.0000 mg | ORAL_TABLET | Freq: Every day | ORAL | 0 refills | Status: DC

## 2020-12-12 MED ORDER — NYSTATIN 100000 UNIT/GM EX POWD
1.0000 "application " | Freq: Three times a day (TID) | CUTANEOUS | Status: DC
  Filled 2020-12-12: qty 15

## 2020-12-12 MED ORDER — ACETAMINOPHEN 650 MG RE SUPP: 650.0000 mg | Freq: Four times a day (QID) | RECTAL | Status: DC | PRN

## 2020-12-12 MED ORDER — ONDANSETRON HCL 4 MG/2ML IJ SOLN: 4.0000 mg | Freq: Four times a day (QID) | INTRAMUSCULAR | Status: DC | PRN

## 2020-12-12 MED ORDER — INSULIN GLARGINE 100 UNIT/ML ~~LOC~~ SOLN: 30.0000 [IU] | Freq: Every day | SUBCUTANEOUS | Status: DC

## 2020-12-12 MED ORDER — ACETAMINOPHEN 325 MG PO TABS: 650.0000 mg | ORAL_TABLET | Freq: Four times a day (QID) | ORAL | Status: DC | PRN

## 2020-12-12 MED ORDER — METHOCARBAMOL 500 MG PO TABS: 500.0000 mg | ORAL_TABLET | Freq: Four times a day (QID) | ORAL | Status: DC | PRN

## 2020-12-12 MED ORDER — LORATADINE 10 MG PO TABS: 10.0000 mg | ORAL_TABLET | Freq: Every day | ORAL | Status: DC | PRN

## 2020-12-12 MED ORDER — FLUTICASONE FUROATE-VILANTEROL 200-25 MCG/INH IN AEPB
1.0000 | INHALATION_SPRAY | Freq: Every day | RESPIRATORY_TRACT | Status: DC
  Administered 2020-12-12: 1 via RESPIRATORY_TRACT
  Filled 2020-12-12: qty 28

## 2020-12-12 MED ORDER — LEVOFLOXACIN 500 MG PO TABS
500.0000 mg | ORAL_TABLET | Freq: Every day | ORAL | Status: DC
  Administered 2020-12-12: 500 mg via ORAL
  Filled 2020-12-12: qty 1

## 2020-12-12 MED ORDER — OXYCODONE-ACETAMINOPHEN 5-325 MG PO TABS: 1.0000 | ORAL_TABLET | ORAL | Status: DC | PRN

## 2020-12-12 MED ORDER — OXYCODONE ER 9 MG PO C12A: 9.0000 mg | EXTENDED_RELEASE_CAPSULE | Freq: Two times a day (BID) | ORAL | Status: DC

## 2020-12-12 MED ORDER — PIPERACILLIN-TAZOBACTAM 3.375 G IVPB
3.3750 g | Freq: Three times a day (TID) | INTRAVENOUS | Status: DC
  Administered 2020-12-12: 3.375 g via INTRAVENOUS
  Filled 2020-12-12: qty 50

## 2020-12-12 MED ORDER — ENOXAPARIN SODIUM 40 MG/0.4ML ~~LOC~~ SOLN: 40.0000 mg | SUBCUTANEOUS | Status: DC

## 2020-12-12 NOTE — H&P (Deleted)
History and Physical    Yesenia Payne GMW:[id number] DOB: April 22, 1951 DOA: 12/11/2020  Referring MD/NP/PA: Addison Lank, MD PCP: Patient, No Pcp Per  Patient coming from: Home   Chief Complaint: Urinary tract infection  I have personally briefly reviewed patient's old medical records in [place]   HPI: Yesenia Payne is a 70 y.o. female with medical history significant of hypertension, hyperlipidemia, insulin-dependent diabetes mellitus, chronic pain and s/p left BKA 09/2020 presents with complaints of having a urinary tract infection.  Patient complained of having a foul odor to her urine that started approximately 2 to 3 weeks ago.  Denies having any dysuria or increased frequency.  Her daughter had asked home health nurse to check the patient's urine.  They can contact with Dr. Jess Barters office who agreed to check a urinalysis.  Patient reports that it took a little bit of time for them to check the urine and get reports back pain.  They ended up prescribing Bactrim, but later told the patient not to pick this medication up after sensitivities showed that it was resistant to ciprofloxacin.  She was advised to come into the hospital for need of IV antibiotics.  After talks with the patient's home health nurse it appears urine culture was sensitive to Levaquin, Zosyn, cefepime, gentamicin, and amikacin.  ED Course: Upon admission into the emergency department patient was seen to be afebrile with blood pressure 132/78-163/70, and all other vital signs maintained.  Labs significant for WBC 14.2, glucose 195, and albumin 2.9.  Urinalysis positive for large leukocytes, small hemoglobin, rare bacteria, and 21-50 WBCs.  Urine culture was collected and patient was started on Zosyn.  Review of Systems  Constitutional: Negative for fever and malaise/fatigue.  HENT: Negative for ear pain.   Eyes: Negative for photophobia and pain.  Respiratory: Negative for cough and shortness of breath.    Cardiovascular: Negative for leg swelling.  Gastrointestinal: Negative for nausea and vomiting.  Genitourinary: Negative for hematuria.       Positive for odor  Musculoskeletal: Positive for back pain and joint pain.  Skin:       Pressure sore of right heel  Neurological: Negative for loss of consciousness and weakness.  Endo/Heme/Allergies: Negative for polydipsia.  Psychiatric/Behavioral: Negative for substance abuse. The patient does not have insomnia.     Past Medical History:  Diagnosis Date  . Acid reflux   . Asthma   . Chronic headache   . Diabetes mellitus without complication ([place])   . Diverticular disease   . Diverticulitis   . Dyslipidemia   . Fibromyalgia   . Hyperlipidemia   . Hypertension   . IBS (irritable bowel syndrome)   . Insomnia   . Irregular heartbeat   . Low back pain   . Morbid obesity ([place])   . Neuropathy    distal peripheral  . Osteoarthritis   . PAC (premature atrial contraction) 01/18/2017  . Urinary incontinence   . Vitamin D deficiency     Past Surgical History:  Procedure Laterality Date  . AMPUTATION Left 10/06/2020   Procedure: AMPUTATION BELOW KNEE;  Surgeon: Newt Minion, MD;  Location: [facility];  Service: Orthopedics;  Laterality: Left;  . arthoscopics    . BACK SURGERY     L4 & L5  . CESAREAN SECTION    . CESAREAN SECTION    . colonscopy  2009  . Left Knee Surgery  1993  . Right knee surgery  1990  . TOTAL KNEE  ARTHROPLASTY       reports that she has never smoked. She has never used smokeless tobacco. She reports that she does not drink alcohol and does not use drugs.  Allergies  Allergen Reactions  . Iodinated Diagnostic Agents Hives    Other reaction(s): Hives   . Metrizamide Hives    Family History  Problem Relation Age of Onset  . Hypertension Other   . Hypertension Mother   . Hyperlipidemia Mother   . Heart attack Father   . Diabetes Father   . CAD Father   . Neuropathy Father   . Diabetes Paternal  Grandfather   . Cancer - Other Sister        uterine cancer  . Mental retardation Brother   . Leukemia Sister   . Heart attack Sister   . Other Daughter        ruptured colon x2    Prior to Admission medications   Medication Sig Start Date End Date Taking? Authorizing Provider  albuterol (PROVENTIL) (2.5 MG/3ML) 0.083% nebulizer solution Take 2.5 mg by nebulization 3 (three) times daily as needed. For shortness of breath.   Yes [provider]  Cholecalciferol (DIALYVITE VITAMIN D 5000 PO) Take 10,000 Units by mouth daily.   Yes [provider]  collagenase (SANTYL) ointment Apply topically daily. 10/15/20  Yes Nita Sells, MD  desloratadine (CLARINEX) 5 MG tablet Take 5 mg by mouth daily as needed (allergies).   Yes [provider]  docusate sodium (COLACE) 100 MG capsule Take 100 mg by mouth 2 (two) times daily.   Yes [provider]  dorzolamide (TRUSOPT) 2 % ophthalmic solution Place 1 drop into both eyes in the morning and at bedtime.   Yes [provider]  fluticasone (FLONASE) 50 MCG/ACT nasal spray Place 1 spray into both nostrils 2 (two) times daily as needed for allergies.   Yes [provider]  Fluticasone-Salmeterol (ADVAIR) 250-50 MCG/DOSE AEPB Inhale 1 puff into the lungs daily.   Yes [provider]  furosemide (LASIX) 20 MG tablet Take 40 mg by mouth 2 (two) times daily.   Yes [provider]  gabapentin (NEURONTIN) 300 MG capsule Take 1 capsule (300 mg total) by mouth 3 (three) times daily. 10/14/20  Yes Nita Sells, MD  insulin glargine (LANTUS) 100 UNIT/ML injection Inject 0.3 mLs (30 Units total) into the skin daily. Patient taking differently: Inject 30 Units into the skin at bedtime. 10/14/20  Yes Nita Sells, MD  Magnesium Oxide 420 MG TABS Take 420 mg by mouth daily.   Yes [provider]  methocarbamol (ROBAXIN) 500 MG tablet Take 1 tablet (500 mg total) by mouth  every 6 (six) hours as needed for muscle spasms. 12/05/20  Yes Marybelle Killings, MD  nystatin (MYCOSTATIN/NYSTOP) powder Apply 1 application topically 3 (three) times daily. 12/10/20  Yes Persons, Bevely Palmer, PA  omeprazole (PRILOSEC) 20 MG capsule Take 20 mg by mouth 2 (two) times daily before a meal.   Yes [provider]  oxyCODONE ER (XTAMPZA ER) 9 MG C12A Take 9 mg by mouth every 12 (twelve) hours as needed. Patient taking differently: Take 9 mg by mouth in the morning and at bedtime. 10/21/20  Yes Persons, Bevely Palmer, [place]  oxyCODONE-acetaminophen (PERCOCET/ROXICET) 5-325 MG tablet Take 1 tablet by mouth every 4 (four) hours as needed for moderate pain. 10/11/20  Yes Newt Minion, MD  potassium chloride (KLOR-CON) 10 MEQ tablet Take 10 mEq by mouth daily. 08/20/15  Yes [provider]  SUMAtriptan (IMITREX) 25 MG tablet Take 25 mg by mouth daily as needed for migraine.   Yes [provider]  metoprolol succinate (TOPROL-XL) 50 MG 24 hr tablet Take 1 tablet (50 mg total) by mouth daily. Take with or immediately following a meal. Patient not taking: Reported on 10/04/2020 01/18/17 01/13/18  Sueanne Margarita, MD  metoprolol succinate (TOPROL-XL) 50 MG 24 hr tablet Take 1 tablet (50 mg total) by mouth daily. Take with or immediately following a meal. Patient not taking: No sig reported 10/14/20 10/09/21  Nita Sells, MD    Physical Exam:  Constitutional: Obese elderly female currently in NAD, calm, comfortable Vitals:   12/11/20 1755 12/11/20 2116 12/12/20 0130 12/12/20 0456  BP: 132/78 (!) 155/82 (!) 163/70 (!) 141/72  Pulse: 63 76 78 69  Resp: 16 16 17 17   Temp: 98.7 F (37.1 C)     TempSrc: Oral     SpO2: 99% 97% 95% 96%   Eyes: PERRL, lids and conjunctivae normal ENMT: Mucous membranes are moist. Posterior pharynx clear of any exudate or lesions.poor dentition Neck: normal, supple, no masses, no thyromegaly Respiratory: clear to auscultation bilaterally, no  wheezing, no crackles. Normal respiratory effort. No accessory muscle use.  Cardiovascular: Regular rate and rhythm, no murmurs / rubs / gallops. No extremity edema. 2+ pedal pulses. No carotid bruits.  Abdomen: no tenderness, no masses palpated. No hepatosplenomegaly. Bowel sounds positive.  Musculoskeletal: no clubbing / cyanosis.  Left below knee amputation.  Right second digit amputation Skin: Pressure ulcer of the right heel Neurologic: CN 2-12 grossly intact. Sensation intact, DTR normal. Strength 5/5 in all 4.  Psychiatric: Normal judgment and insight. Alert and oriented x 3. Normal mood.     Labs on Admission: I have personally reviewed following labs and imaging studies  CBC: Recent Labs  Lab 12/11/20 1813  WBC 14.2*  HGB 14.7  HCT 45.3  MCV 89.0  PLT 927   Basic Metabolic Panel: Recent Labs  Lab 12/11/20 1813  NA 138  K 4.0  CL 99  CO2 25  GLUCOSE 195*  BUN 12  CREATININE 0.64  CALCIUM 9.1   GFR: CrCl cannot be calculated (Unknown ideal weight.). Liver Function Tests: Recent Labs  Lab 12/11/20 1813  AST 11*  ALT 8  ALKPHOS 57  BILITOT 0.5  PROT 7.4  ALBUMIN 2.9*   Recent Labs  Lab 12/11/20 1813  LIPASE 21   No results for input(s): AMMONIA in the last 168 hours. Coagulation Profile: No results for input(s): INR, PROTIME in the last 168 hours. Cardiac Enzymes: No results for input(s): CKTOTAL, CKMB, CKMBINDEX, TROPONINI in the last 168 hours. BNP (last 3 results) No results for input(s): PROBNP in the last 8760 hours. HbA1C: No results for input(s): HGBA1C in the last 72 hours. CBG: No results for input(s): GLUCAP in the last 168 hours. Lipid Profile: No results for input(s): CHOL, HDL, LDLCALC, TRIG, CHOLHDL, LDLDIRECT in the last 72 hours. Thyroid Function Tests: No results for input(s): TSH, T4TOTAL, FREET4, T3FREE, THYROIDAB in the last 72 hours. Anemia Panel: No results for input(s): VITAMINB12, FOLATE, FERRITIN, TIBC, IRON,  RETICCTPCT in the last 72 hours. Urine analysis:    Component Value Date/Time   COLORURINE YELLOW 12/11/2020 1800   APPEARANCEUR CLEAR 12/11/2020 1800   LABSPEC 1.010 12/11/2020 1800   PHURINE 7.0 12/11/2020 1800   GLUCOSEU NEGATIVE 12/11/2020 1800   HGBUR SMALL (A) 12/11/2020 1800   BILIRUBINUR NEGATIVE 12/11/2020 1800  KETONESUR NEGATIVE 12/11/2020 1800   PROTEINUR NEGATIVE 12/11/2020 1800   UROBILINOGEN 0.2 11/11/2011 1443   NITRITE NEGATIVE 12/11/2020 1800   LEUKOCYTESUR LARGE (A) 12/11/2020 1800   Sepsis Labs: No results found for this or any previous visit (from the past 240 hour(s)).   Radiological Exams on Admission: No results found.    Assessment/Plan Pseudomonas urinary tract infection: Acute.  Patient had recently had urinalysis and culture performed at orthopedics.  Urine culture was positive for Pseudomonas aerogenes and they were told that it required IV antibiotics.  Urinalysis on admission positive for large leukocytes, rare bacteria, small hemoglobin, and 21-50 WBCs.  Patient has been started empirically on Zosyn.  Urine cultures have only reported only been resistant only to ciprofloxacin after talks with the patient's home health nurse -Admit to MedSurg bed -Follow-up urine culture -Continue empiric antibiotics with Zosyn   -Consider de-escalating and switching to oral antibiotics with possible discharge home in a.m.  Leukocytosis: Acute.  WBC elevated up to 14.2.  No other SIRS criteria met to give concern for sepsis.  Suspect secondary to above. -Continue to monitor  Diabetes mellitus type 2, uncontrolled: Patient presents with glucose elevated up to 195.  Last hemoglobin A1c 7.2 on 10/04/2020.  Home regimen includes Lantus 30 units nightly -Hypoglycemic protocols -Continue home regimen of Lantus -CBGs before every meal with sensitive SSI -Adjust insulin regimen as needed  Hypoalbuminemia: Acute.  Albumin 2.9 on admission. -Check prealbumin in  a.m.  S/p left BKA: Patient status post left below-knee amputation and November 2021 by Dr. Sharol Given. -Continue outpatient follow-up as needed  Chronic pain -Continue home pain regimen   GERD: Home medication regimen includes omeprazole 20 mg before every meal-Continue pharmacy substitution of  DVT prophylaxis:   Lovenox Code Status: Full Family Communication: Daughter updated over Disposition Plan: Likely discharge home Consults called: None Admission status: Inpatient, requiring more than 2 midnight stay for need of IV antibiotics  Norval Maroney MD [facility]   If 7PM-7AM, please contact night-coverage   12/12/2020, 7:09 AM

## 2020-12-12 NOTE — Progress Notes (Signed)
Pharmacy Antibiotic Note  Yesenia Payne is a 70 y.o. female admitted on 12/11/2020 with UTI.  Pharmacy has been consulted for zosyn dosing.  Plan: Zosyn 3.375g IV q8h (4 hour infusion).  F/u renal function, cultures and clinical course     Temp (24hrs), Avg:98.7 F (37.1 C), Min:98.7 F (37.1 C), Max:98.7 F (37.1 C)  Recent Labs  Lab 12/11/20 1813  WBC 14.2*  CREATININE 0.64    CrCl cannot be calculated (Unknown ideal weight.).    Allergies  Allergen Reactions  . Iodinated Diagnostic Agents Hives    Other reaction(s): Hives   . Metrizamide Hives    Thank you for allowing pharmacy to be a part of this patient's care.  Talbert Cage [place] 12/12/2020 6:52 AM

## 2020-12-12 NOTE — ED Notes (Signed)
Secretary to call [facility] to arrange transportation home for pt.

## 2020-12-12 NOTE — Consult Note (Signed)
Consult note   Yesenia Payne WCB:[id number] DOB: 1951-01-31 DOA: 12/11/2020  Referring MD/NP/PA: Addison Lank, MD PCP: Patient, No Pcp Per  Patient coming from: Home   Chief Complaint: Urinary tract infection  I have personally briefly reviewed patient's old medical records in [place]   HPI: Yesenia Payne is a 70 y.o. female with medical history significant of hypertension, hyperlipidemia, insulin-dependent diabetes mellitus, chronic pain and s/p left BKA 09/2020 presents with complaints of having a urinary tract infection.  Patient complained of having a foul odor to her urine that started approximately 2 to 3 weeks ago.  Denies having any dysuria or increased frequency.  Her daughter had asked home health nurse to check the patient's urine.  They can contact with Dr. Jess Barters office who agreed to check a urinalysis.  Patient reports that it took a little bit of time for them to check the urine and get reports back pain.  They ended up prescribing Bactrim, but later told the patient not to pick this medication up after sensitivities showed that it was resistant to ciprofloxacin.  She was advised to come into the hospital for need of IV antibiotics.  She was no longer being followed by Dr. Carol Ada of [place].  Her daughter was in the process of looking for a new primary care provider.  After talks with the patient's home health nurse it appears urine culture was sensitive to Levaquin, Zosyn, cefepime, gentamicin, and amikacin.  ED Course: Upon admission into the emergency department patient was seen to be afebrile with blood pressure 132/78-163/70, and all other vital signs maintained.  Labs significant for WBC 14.2, glucose 195, and albumin 2.9.  Urinalysis positive for large leukocytes, small hemoglobin, rare bacteria, and 21-50 WBCs.  Urine culture was collected and patient was started on Zosyn.  Review of Systems  Constitutional: Negative for fever and  malaise/fatigue.  HENT: Negative for ear pain.   Eyes: Negative for photophobia and pain.  Respiratory: Negative for cough and shortness of breath.   Cardiovascular: Negative for leg swelling.  Gastrointestinal: Negative for nausea and vomiting.  Genitourinary: Negative for hematuria.       Positive for odor  Musculoskeletal: Positive for back pain and joint pain.  Skin:       Pressure sore of right heel  Neurological: Negative for loss of consciousness and weakness.  Endo/Heme/Allergies: Negative for polydipsia.  Psychiatric/Behavioral: Negative for substance abuse. The patient does not have insomnia.         Past Medical History:  Diagnosis Date  . Acid reflux   . Asthma   . Chronic headache   . Diabetes mellitus without complication ([place])   . Diverticular disease   . Diverticulitis   . Dyslipidemia   . Fibromyalgia   . Hyperlipidemia   . Hypertension   . IBS (irritable bowel syndrome)   . Insomnia   . Irregular heartbeat   . Low back pain   . Morbid obesity ([place])   . Neuropathy    distal peripheral  . Osteoarthritis   . PAC (premature atrial contraction) 01/18/2017  . Urinary incontinence   . Vitamin D deficiency          Past Surgical History:  Procedure Laterality Date  . AMPUTATION Left 10/06/2020   Procedure: AMPUTATION BELOW KNEE;  Surgeon: Newt Minion, MD;  Location: [facility];  Service: Orthopedics;  Laterality: Left;  . arthoscopics    . BACK SURGERY     L4 & L5  .  CESAREAN SECTION    . CESAREAN SECTION    . colonscopy  2009  . Left Knee Surgery  1993  . Right knee surgery  1990  . TOTAL KNEE ARTHROPLASTY       reports that she has never smoked. She has never used smokeless tobacco. She reports that she does not drink alcohol and does not use drugs.       Allergies  Allergen Reactions  . Iodinated Diagnostic Agents Hives    Other reaction(s): Hives   . Metrizamide Hives         Family History   Problem Relation Age of Onset  . Hypertension Other   . Hypertension Mother   . Hyperlipidemia Mother   . Heart attack Father   . Diabetes Father   . CAD Father   . Neuropathy Father   . Diabetes Paternal Grandfather   . Cancer - Other Sister        uterine cancer  . Mental retardation Brother   . Leukemia Sister   . Heart attack Sister   . Other Daughter        ruptured colon x2           Prior to Admission medications   Medication Sig Start Date End Date Taking? Authorizing Provider  albuterol (PROVENTIL) (2.5 MG/3ML) 0.083% nebulizer solution Take 2.5 mg by nebulization 3 (three) times daily as needed. For shortness of breath.   Yes [provider]  Cholecalciferol (DIALYVITE VITAMIN D 5000 PO) Take 10,000 Units by mouth daily.   Yes [provider]  collagenase (SANTYL) ointment Apply topically daily. 10/15/20  Yes Nita Sells, MD  desloratadine (CLARINEX) 5 MG tablet Take 5 mg by mouth daily as needed (allergies).   Yes [provider]  docusate sodium (COLACE) 100 MG capsule Take 100 mg by mouth 2 (two) times daily.   Yes [provider]  dorzolamide (TRUSOPT) 2 % ophthalmic solution Place 1 drop into both eyes in the morning and at bedtime.   Yes [provider]  fluticasone (FLONASE) 50 MCG/ACT nasal spray Place 1 spray into both nostrils 2 (two) times daily as needed for allergies.   Yes [provider]  Fluticasone-Salmeterol (ADVAIR) 250-50 MCG/DOSE AEPB Inhale 1 puff into the lungs daily.   Yes [provider]  furosemide (LASIX) 20 MG tablet Take 40 mg by mouth 2 (two) times daily.   Yes [provider]  gabapentin (NEURONTIN) 300 MG capsule Take 1 capsule (300 mg total) by mouth 3 (three) times daily. 10/14/20  Yes Nita Sells, MD  insulin glargine (LANTUS) 100 UNIT/ML injection Inject 0.3 mLs (30 Units total) into the skin daily. Patient taking  differently: Inject 30 Units into the skin at bedtime. 10/14/20  Yes Nita Sells, MD  Magnesium Oxide 420 MG TABS Take 420 mg by mouth daily.   Yes [provider]  methocarbamol (ROBAXIN) 500 MG tablet Take 1 tablet (500 mg total) by mouth every 6 (six) hours as needed for muscle spasms. 12/05/20  Yes Marybelle Killings, MD  nystatin (MYCOSTATIN/NYSTOP) powder Apply 1 application topically 3 (three) times daily. 12/10/20  Yes Persons, Bevely Palmer, PA  omeprazole (PRILOSEC) 20 MG capsule Take 20 mg by mouth 2 (two) times daily before a meal.   Yes [provider]  oxyCODONE ER (XTAMPZA ER) 9 MG C12A Take 9 mg by mouth every 12 (twelve) hours as needed. Patient taking differently: Take 9 mg by mouth in the morning and at  bedtime. 10/21/20  Yes Persons, Bevely Palmer, [place]  oxyCODONE-acetaminophen (PERCOCET/ROXICET) 5-325 MG tablet Take 1 tablet by mouth every 4 (four) hours as needed for moderate pain. 10/11/20  Yes Newt Minion, MD  potassium chloride (KLOR-CON) 10 MEQ tablet Take 10 mEq by mouth daily. 08/20/15  Yes [provider]  SUMAtriptan (IMITREX) 25 MG tablet Take 25 mg by mouth daily as needed for migraine.   Yes [provider]  metoprolol succinate (TOPROL-XL) 50 MG 24 hr tablet Take 1 tablet (50 mg total) by mouth daily. Take with or immediately following a meal. Patient not taking: Reported on 10/04/2020 01/18/17 01/13/18  Sueanne Margarita, MD  metoprolol succinate (TOPROL-XL) 50 MG 24 hr tablet Take 1 tablet (50 mg total) by mouth daily. Take with or immediately following a meal. Patient not taking: No sig reported 10/14/20 10/09/21  Nita Sells, MD    Physical Exam:  Constitutional: Obese elderly female currently in NAD, calm, comfortable       Vitals:   12/11/20 1755 12/11/20 2116 12/12/20 0130 12/12/20 0456  BP: 132/78 (!) 155/82 (!) 163/70 (!) 141/72  Pulse: 63 76 78 69  Resp: 16 16 17 17   Temp: 98.7 F (37.1 C)      TempSrc: Oral     SpO2: 99% 97% 95% 96%   Eyes: PERRL, lids and conjunctivae normal ENMT: Mucous membranes are moist. Posterior pharynx clear of any exudate or lesions.poor dentition Neck: normal, supple, no masses, no thyromegaly Respiratory: clear to auscultation bilaterally, no wheezing, no crackles. Normal respiratory effort. No accessory muscle use.  Cardiovascular: Regular rate and rhythm, no murmurs / rubs / gallops. No extremity edema. 2+ pedal pulses. No carotid bruits.  Abdomen: no tenderness, no masses palpated. No hepatosplenomegaly. Bowel sounds positive.  Musculoskeletal: no clubbing / cyanosis.  Left below knee amputation.  Right second digit amputation Skin: Pressure ulcer of the right heel Neurologic: CN 2-12 grossly intact. Sensation intact, DTR normal. Strength 5/5 in all 4.  Psychiatric: Normal judgment and insight. Alert and oriented x 3. Normal mood.     Labs on Admission: I have personally reviewed following labs and imaging studies  CBC: Last Labs      Recent Labs  Lab 12/11/20 1813  WBC 14.2*  HGB 14.7  HCT 45.3  MCV 89.0  PLT 277     Basic Metabolic Panel: Last Labs      Recent Labs  Lab 12/11/20 1813  NA 138  K 4.0  CL 99  CO2 25  GLUCOSE 195*  BUN 12  CREATININE 0.64  CALCIUM 9.1     GFR: CrCl cannot be calculated (Unknown ideal weight.). Liver Function Tests: Last Labs      Recent Labs  Lab 12/11/20 1813  AST 11*  ALT 8  ALKPHOS 57  BILITOT 0.5  PROT 7.4  ALBUMIN 2.9*     Last Labs      Recent Labs  Lab 12/11/20 1813  LIPASE 21     Last Labs   No results for input(s): AMMONIA in the last 168 hours.   Coagulation Profile: Last Labs   No results for input(s): INR, PROTIME in the last 168 hours.   Cardiac Enzymes: Last Labs   No results for input(s): CKTOTAL, CKMB, CKMBINDEX, TROPONINI in the last 168 hours.   BNP (last 3 results) Recent Labs (within last 365 days)  No results for input(s):  PROBNP in the last 8760 hours.   HbA1C: Recent Labs (last 2 labs)  No results for input(s): HGBA1C in the last 72 hours.   CBG: Last Labs   No results for input(s): GLUCAP in the last 168 hours.   Lipid Profile: Recent Labs (last 2 labs)   No results for input(s): CHOL, HDL, LDLCALC, TRIG, CHOLHDL, LDLDIRECT in the last 72 hours.   Thyroid Function Tests: Recent Labs (last 2 labs)   No results for input(s): TSH, T4TOTAL, FREET4, T3FREE, THYROIDAB in the last 72 hours.   Anemia Panel: Recent Labs (last 2 labs)   No results for input(s): VITAMINB12, FOLATE, FERRITIN, TIBC, IRON, RETICCTPCT in the last 72 hours.   Urine analysis: Labs (Brief)          Component Value Date/Time   COLORURINE YELLOW 12/11/2020 1800   APPEARANCEUR CLEAR 12/11/2020 1800   LABSPEC 1.010 12/11/2020 1800   PHURINE 7.0 12/11/2020 1800   GLUCOSEU NEGATIVE 12/11/2020 1800   HGBUR SMALL (A) 12/11/2020 1800   BILIRUBINUR NEGATIVE 12/11/2020 1800   KETONESUR NEGATIVE 12/11/2020 1800   PROTEINUR NEGATIVE 12/11/2020 1800   UROBILINOGEN 0.2 11/11/2011 1443   NITRITE NEGATIVE 12/11/2020 1800   LEUKOCYTESUR LARGE (A) 12/11/2020 1800     Sepsis Labs: No results found for this or any previous visit (from the past 240 hour(s)).   Radiological Exams on Admission: Imaging Results (Last 48 hours)  No results found.      Assessment/Plan Pseudomonas urinary tract infection: Acute.  Patient had recently had urinalysis and culture performed at orthopedics.  Urine culture was positive for Pseudomonas aerogenes and they were told that it required IV antibiotics.  Urinalysis on admission positive for large leukocytes, rare bacteria, small hemoglobin, and 21-50 WBCs.  Patient has been started empirically on Zosyn.  Urine cultures have only reported only been resistant only to ciprofloxacin after talks with the patient's home health nurse -Admit to MedSurg bed -Follow-up urine culture -Continue  empiric antibiotics with Zosyn   -De-escalate antibiotics to Levaquin 500 mg p.o. daily to complete 3 days  Leukocytosis: Acute.  WBC elevated up to 14.2.  No other SIRS criteria met to give concern for sepsis.  Suspect secondary to above.   Diabetes mellitus type 2, uncontrolled: Patient presents with glucose elevated up to 195.  Last hemoglobin A1c 7.2 on 10/04/2020.  Home regimen includes Lantus 30 units nightly.  While hospitalized patient was placed on CBGs before every meal with sensitive SSI but recommended to continue Lantus 30 mg daily  Hypoalbuminemia: Acute.  Albumin 2.9 on admission.  Patient can be followed up by primary care provider once established in the outpatient setting.  S/p left BKA: Patient status post left below-knee amputation and November 2021 by Dr. Sharol Given. -Continue outpatient follow-up with Dr. Sharol Given  Chronic pain -Continued home pain regimen   GERD: Home medication regimen includes omeprazole 20 mg before every meal. -Continue at discharge  Morbid obesity: BMI 42 kg/m  DVT prophylaxis:  Lovenox Code Status: Full Family Communication: Daughter updated over Consults called: None Admission status: Okay to be discharged home.  Social work consulted for helping arrange transport.  Norval Bartha MD [facility]   If 7PM-7AM, please contact night-coverage

## 2020-12-12 NOTE — ED Provider Notes (Signed)
[facility] EMERGENCY DEPARTMENT Provider Note  CSN: [id number] Arrival date & time: 12/11/20 1755  Chief Complaint(s) Urinary Tract Infection  HPI Yesenia Payne is a 70 y.o. female with a past medical history listed below including diabetes requiring recent amputation presents to the ED after being called back from the orthopedics clinic for a positive urine culture that grew out Pseudomonas requiring IV antibiotics.  The sample was taken 2 weeks ago.  Patient denies any urinary symptoms.  Denies any fevers or chills. No abdominal pain. No N/V.  Her wound has been healing well. She denies discharge or pain.  currenlty being treated for perineal yeast infection.   HPI  Past Medical History Past Medical History:  Diagnosis Date  . Acid reflux   . Asthma   . Chronic headache   . Diabetes mellitus without complication ([facility])   . Diverticular disease   . Diverticulitis   . Dyslipidemia   . Fibromyalgia   . Hyperlipidemia   . Hypertension   . IBS (irritable bowel syndrome)   . Insomnia   . Irregular heartbeat   . Low back pain   . Morbid obesity ([facility])   . Neuropathy    distal peripheral  . Osteoarthritis   . PAC (premature atrial contraction) 01/18/2017  . Urinary incontinence   . Vitamin D deficiency    Patient Active Problem List   Diagnosis Date Noted  . Pseudomonas urinary tract infection 12/12/2020  . Morbidly obese ([facility]) 10/09/2020  . Sepsis ([facility]) 10/05/2020  . Pressure injury of skin 10/05/2020  . Cutaneous abscess of left foot   . Anemia   . Necrotizing cellulitis 10/04/2020  . DOE (dyspnea on exertion) 01/18/2017  . Benign essential HTN 01/18/2017  . PAC (premature atrial contraction) 01/18/2017   Home Medication(s) Prior to Admission medications   Medication Sig Start Date End Date Taking? Authorizing Provider  albuterol (PROVENTIL) (2.5 MG/3ML) 0.083% nebulizer solution Take 2.5 mg by nebulization 3 (three) times daily as needed.  For shortness of breath.   Yes [provider]  Cholecalciferol (DIALYVITE VITAMIN D 5000 PO) Take 10,000 Units by mouth daily.   Yes [provider]  collagenase (SANTYL) ointment Apply topically daily. 10/15/20  Yes Rhetta Mura, MD  desloratadine (CLARINEX) 5 MG tablet Take 5 mg by mouth daily as needed (allergies).   Yes [provider]  docusate sodium (COLACE) 100 MG capsule Take 100 mg by mouth 2 (two) times daily.   Yes [provider]  dorzolamide (TRUSOPT) 2 % ophthalmic solution Place 1 drop into both eyes in the morning and at bedtime.   Yes [provider]  fluticasone (FLONASE) 50 MCG/ACT nasal spray Place 1 spray into both nostrils 2 (two) times daily as needed for allergies.   Yes [provider]  Fluticasone-Salmeterol (ADVAIR) 250-50 MCG/DOSE AEPB Inhale 1 puff into the lungs daily.   Yes [provider]  furosemide (LASIX) 20 MG tablet Take 40 mg by mouth 2 (two) times daily.   Yes [provider]  gabapentin (NEURONTIN) 300 MG capsule Take 1 capsule (300 mg total) by mouth 3 (three) times daily. 10/14/20  Yes Rhetta Mura, MD  insulin glargine (LANTUS) 100 UNIT/ML injection Inject 0.3 mLs (30 Units total) into the skin daily. Patient taking differently: Inject 30 Units into the skin at bedtime. 10/14/20  Yes Rhetta Mura, MD  Magnesium Oxide 420 MG TABS Take 420 mg by mouth daily.   Yes [provider]  methocarbamol (ROBAXIN)  500 MG tablet Take 1 tablet (500 mg total) by mouth every 6 (six) hours as needed for muscle spasms. 12/05/20  Yes Eldred Manges, MD  nystatin (MYCOSTATIN/NYSTOP) powder Apply 1 application topically 3 (three) times daily. 12/10/20  Yes Persons, [place], [place]  omeprazole (PRILOSEC) 20 MG capsule Take 20 mg by mouth 2 (two) times daily before a meal.   Yes [provider]  oxyCODONE ER (XTAMPZA ER) 9 MG C12A Take 9 mg by mouth every 12 (twelve) hours as  needed. Patient taking differently: Take 9 mg by mouth in the morning and at bedtime. 10/21/20  Yes Persons, [place], [place]  oxyCODONE-acetaminophen (PERCOCET/ROXICET) 5-325 MG tablet Take 1 tablet by mouth every 4 (four) hours as needed for moderate pain. 10/11/20  Yes [name], MD  potassium chloride (KLOR-CON) 10 MEQ tablet Take 10 mEq by mouth daily. 08/20/15  Yes [provider]  SUMAtriptan (IMITREX) 25 MG tablet Take 25 mg by mouth daily as needed for migraine.   Yes [provider]  metoprolol succinate (TOPROL-XL) 50 MG 24 hr tablet Take 1 tablet (50 mg total) by mouth daily. Take with or immediately following a meal. Patient not taking: Reported on 10/04/2020 01/18/17 01/13/18  Quintella Reichert, MD  metoprolol succinate (TOPROL-XL) 50 MG 24 hr tablet Take 1 tablet (50 mg total) by mouth daily. Take with or immediately following a meal. Patient not taking: No sig reported 10/14/20 10/09/21  Rhetta Mura, MD                                                                                                                                    Past Surgical History Past Surgical History:  Procedure Laterality Date  . AMPUTATION Left 10/06/2020   Procedure: AMPUTATION BELOW KNEE;  Surgeon: [name], MD;  Location: [facility] OR;  Service: Orthopedics;  Laterality: Left;  . arthoscopics    . BACK SURGERY     L4 & L5  . CESAREAN SECTION    . CESAREAN SECTION    . colonscopy  2009  . Left Knee Surgery  1993  . Right knee surgery  1990  . TOTAL KNEE ARTHROPLASTY     Family History Family History  Problem Relation Age of Onset  . Hypertension Other   . Hypertension Mother   . Hyperlipidemia Mother   . Heart attack Father   . Diabetes Father   . CAD Father   . Neuropathy Father   . Diabetes Paternal Grandfather   . Cancer - Other Sister        uterine cancer  . Mental retardation Brother   . Leukemia Sister   . Heart attack Sister   . Other Daughter         ruptured colon x2    Social History Social History   Tobacco Use  . Smoking status: Never Smoker  . Smokeless tobacco: Never Used  Substance Use Topics  . Alcohol use: No    Alcohol/week: 0.0 standard drinks  . Drug use: No   Allergies Iodinated diagnostic agents and Metrizamide  Review of Systems Review of Systems All other systems are reviewed and are negative for acute change except as noted in the HPI  Physical Exam Vital Signs  I have reviewed the triage vital signs BP (!) 141/72 (BP Location: Right Arm)   Pulse 69   Temp 98.7 F (37.1 C) (Oral)   Resp 17   SpO2 96%   Physical Exam Vitals reviewed.  Constitutional:      General: She is not in acute distress.    Appearance: She is well-developed and well-nourished. She is not diaphoretic.  HENT:     Head: Normocephalic and atraumatic.     Right Ear: External ear normal.     Left Ear: External ear normal.     Nose: Nose normal.  Eyes:     General: No scleral icterus.    Extraocular Movements: EOM normal.     Conjunctiva/sclera: Conjunctivae normal.  Neck:     Trachea: Phonation normal.  Cardiovascular:     Rate and Rhythm: Normal rate and regular rhythm.  Pulmonary:     Effort: Pulmonary effort is normal. No respiratory distress.     Breath sounds: No stridor.  Abdominal:     General: There is no distension.  Musculoskeletal:        General: No edema. Normal range of motion.     Cervical back: Normal range of motion.  Neurological:     Mental Status: She is alert and oriented to person, place, and time.  Psychiatric:        Mood and Affect: Mood and affect normal.        Behavior: Behavior normal.     ED Results and Treatments Labs (all labs ordered are listed, but only abnormal results are displayed) Labs Reviewed  COMPREHENSIVE METABOLIC PANEL - Abnormal; Notable for the following components:      Result Value   Glucose, Bld 195 (*)    Albumin 2.9 (*)    AST 11 (*)    All other components  within normal limits  CBC - Abnormal; Notable for the following components:   WBC 14.2 (*)    All other components within normal limits  URINALYSIS, ROUTINE W REFLEX MICROSCOPIC - Abnormal; Notable for the following components:   Hgb urine dipstick SMALL (*)    Leukocytes,Ua LARGE (*)    Bacteria, UA RARE (*)    All other components within normal limits  URINE CULTURE  SARS CORONAVIRUS 2 (TAT 6-24 HRS)  LIPASE, BLOOD  LACTIC ACID, PLASMA                                                                                                                         EKG  EKG Interpretation  Date/Time:    Ventricular Rate:    PR Interval:    QRS  Duration:   QT Interval:    QTC Calculation:   R Axis:     Text Interpretation:        Radiology No results found.  Pertinent labs & imaging results that were available during my care of the patient were reviewed by me and considered in my medical decision making (see chart for details).  Medications Ordered in ED Medications  piperacillin-tazobactam (ZOSYN) IVPB 3.375 g (has no administration in time range)                                                                                                                                    Procedures Procedures  (including critical care time)  Medical Decision Making / ED Course I have reviewed the nursing notes for this encounter and the patient's prior records (if available in EHR or on provided paperwork).   Yesenia Payne was evaluated in Emergency Department on 12/12/2020 for the symptoms described in the history of present illness. She was evaluated in the context of the global COVID-19 pandemic, which necessitated consideration that the patient might be at risk for infection with the SARS-CoV-2 virus that causes COVID-19. Institutional protocols and algorithms that pertain to the evaluation of patients at risk for COVID-19 are in a state of rapid change based on information released by  regulatory bodies including the CDC and federal and state organizations. These policies and algorithms were followed during the patient's care in the ED.  UA nor urine culture available in the system. Not from Ortho PA in Epic does not name bacteria nor susceptibilities, but daughter remembers hearing pseudomonas.  UA here is consistent with infection. She has leukocytosis. She is AFVSS. Doubt sepsis. Will given zosyn and admit to treat while culture results.      Final Clinical Impression(s) / ED Diagnoses Final diagnoses:  Lower urinary tract infectious disease      This chart was dictated using voice recognition software.  Despite best efforts to proofread,  errors can occur which can change the documentation meaning.   Nira Conn, MD 12/12/20 [phone]

## 2020-12-12 NOTE — Progress Notes (Signed)
..   Transition of Care [facility]) - Emergency Department Mini Assessment   Patient Details  Name: Yesenia Payne MRN: [id number] Date of Birth: 08-19-1951  Transition of Care [facility]) CM/SW Contact:    Lossie Faes Tarpley-Carter, LCSWA Phone Number: 12/12/2020, 12:24 PM   Clinical Narrative: [facility] CM/CSW spoke with pt, Morrie Sheldon, RN was at bedside.  Pt verified address and gave permission for CSW to speak with pts daughter/Melissa Burgin.  CSW reached out to [place].  Melissa stated that she would be at pts home awaiting her arrival.    CSW spoke with Morrie Sheldon, RN who will assist with PTAR arrangements and pt will be transport back to residence safely.  Rakeb Kibble Tarpley-Carter, MSW, LCSW-A Pronouns:  She, Her, Hers                  [facility] ED Transitions of CareClinical Social Worker [email or web address] [phone]   ED Mini Assessment: What brought you to the Emergency Department? : UTI  Barriers to Discharge: No Barriers Identified     Means of departure: Ambulance  Interventions which prevented an admission or readmission: Transportation Screening    Patient Contact and Communications Key Contact 1: Sharlette Dense   Spoke with: Harley Hallmark Date: 12/12/20,     Contact Phone Number: [phone] Call outcome: Lylah Lantis will be at pts home to recieve her, but she isn't equipped with proper transportation to transport pt.  Patient states their goals for this hospitalization and ongoing recovery are:: To return home. CMS Medicare.gov Compare Post Acute Care list provided to:: Patient Choice offered to / list presented to : Patient  Admission diagnosis:  Pseudomonas urinary tract infection [N39.0, B96.5] UTI (urinary tract infection) [N39.0] Patient Active Problem List   Diagnosis Date Noted  . Pseudomonas urinary tract infection 12/12/2020  . S/P BKA (below knee amputation) unilateral, left (HCC) 12/12/2020  . Leukocytosis 12/12/2020   . Lower urinary tract infectious disease 12/12/2020  . Morbidly obese (HCC) 10/09/2020  . Sepsis (HCC) 10/05/2020  . Pressure injury of skin 10/05/2020  . Cutaneous abscess of left foot   . Anemia   . Necrotizing cellulitis 10/04/2020  . DOE (dyspnea on exertion) 01/18/2017  . Benign essential HTN 01/18/2017  . PAC (premature atrial contraction) 01/18/2017   PCP:  Patient, No Pcp Per Pharmacy:   [facility] - [place] - [address] [place] [address] [place] [address] [place] [address] Phone: [phone] Fax: [phone]

## 2020-12-12 NOTE — Discharge Instructions (Signed)
Urinary Tract Infection, Adult  A urinary tract infection (UTI) is an infection of any part of the urinary tract. The urinary tract includes the kidneys, ureters, bladder, and urethra. These organs make, store, and get rid of urine in the body. An upper UTI affects the ureters and kidneys. A lower UTI affects the bladder and urethra. What are the causes? Most urinary tract infections are caused by bacteria in your genital area around your urethra, where urine leaves your body. These bacteria grow and cause inflammation of your urinary tract. What increases the risk? You are more likely to develop this condition if:  You have a urinary catheter that stays in place.  You are not able to control when you urinate or have a bowel movement (incontinence).  You are female and you: ? Use a spermicide or diaphragm for birth control. ? Have low estrogen levels. ? Are pregnant.  You have certain genes that increase your risk.  You are sexually active.  You take antibiotic medicines.  You have a condition that causes your flow of urine to slow down, such as: ? An enlarged prostate, if you are female. ? Blockage in your urethra. ? A kidney stone. ? A nerve condition that affects your bladder control (neurogenic bladder). ? Not getting enough to drink, or not urinating often.  You have certain medical conditions, such as: ? Diabetes. ? A weak disease-fighting system (immunesystem). ? Sickle cell disease. ? Gout. ? Spinal cord injury. What are the signs or symptoms? Symptoms of this condition include:  Needing to urinate right away (urgency).  Frequent urination. This may include small amounts of urine each time you urinate.  Pain or burning with urination.  Blood in the urine.  Urine that smells bad or unusual.  Trouble urinating.  Cloudy urine.  Vaginal discharge, if you are female.  Pain in the abdomen or the lower back. You may also have:  Vomiting or a decreased  appetite.  Confusion.  Irritability or tiredness.  A fever or chills.  Diarrhea. The first symptom in older adults may be confusion. In some cases, they may not have any symptoms until the infection has worsened. How is this diagnosed? This condition is diagnosed based on your medical history and a physical exam. You may also have other tests, including:  Urine tests.  Blood tests.  Tests for STIs (sexually transmitted infections). If you have had more than one UTI, a cystoscopy or imaging studies may be done to determine the cause of the infections. How is this treated? Treatment for this condition includes:  Antibiotic medicine.  Over-the-counter medicines to treat discomfort.  Drinking enough water to stay hydrated. If you have frequent infections or have other conditions such as a kidney stone, you may need to see a health care provider who specializes in the urinary tract (urologist). In rare cases, urinary tract infections can cause sepsis. Sepsis is a life-threatening condition that occurs when the body responds to an infection. Sepsis is treated in the hospital with IV antibiotics, fluids, and other medicines. Follow these instructions at home: Medicines  Take over-the-counter and prescription medicines only as told by your health care provider.  If you were prescribed an antibiotic medicine, take it as told by your health care provider. Do not stop using the antibiotic even if you start to feel better. General instructions  Make sure you: ? Empty your bladder often and completely. Do not hold urine for long periods of time. ? Empty your bladder after   sex. ? Wipe from front to back after urinating or having a bowel movement if you are female. Use each tissue only one time when you wipe.  Drink enough fluid to keep your urine pale yellow.  Keep all follow-up visits. This is important.   Contact a health care provider if:  Your symptoms do not get better after 1-2  days.  Your symptoms go away and then return. Get help right away if:  You have severe pain in your back or your lower abdomen.  You have a fever or chills.  You have nausea or vomiting. Summary  A urinary tract infection (UTI) is an infection of any part of the urinary tract, which includes the kidneys, ureters, bladder, and urethra.  Most urinary tract infections are caused by bacteria in your genital area.  Treatment for this condition often includes antibiotic medicines.  If you were prescribed an antibiotic medicine, take it as told by your health care provider. Do not stop using the antibiotic even if you start to feel better.  Keep all follow-up visits. This is important. This information is not intended to replace advice given to you by your health care provider. Make sure you discuss any questions you have with your health care provider. Document Revised: 06/06/2020 Document Reviewed: 06/06/2020 Elsevier Patient Education  2021 Elsevier Inc.  

## 2020-12-12 NOTE — ED Notes (Signed)
This RN unsuccessful at obtaining IV access. IV team consult placed.

## 2020-12-13 DIAGNOSIS — L03116 Cellulitis of left lower limb: Secondary | ICD-10-CM | POA: Diagnosis not present

## 2020-12-13 DIAGNOSIS — E785 Hyperlipidemia, unspecified: Secondary | ICD-10-CM | POA: Diagnosis not present

## 2020-12-13 DIAGNOSIS — N179 Acute kidney failure, unspecified: Secondary | ICD-10-CM | POA: Diagnosis not present

## 2020-12-13 DIAGNOSIS — I4891 Unspecified atrial fibrillation: Secondary | ICD-10-CM | POA: Diagnosis not present

## 2020-12-13 DIAGNOSIS — R6521 Severe sepsis with septic shock: Secondary | ICD-10-CM | POA: Diagnosis not present

## 2020-12-13 DIAGNOSIS — I10 Essential (primary) hypertension: Secondary | ICD-10-CM | POA: Diagnosis not present

## 2020-12-13 DIAGNOSIS — A4189 Other specified sepsis: Secondary | ICD-10-CM | POA: Diagnosis not present

## 2020-12-13 DIAGNOSIS — I872 Venous insufficiency (chronic) (peripheral): Secondary | ICD-10-CM | POA: Diagnosis not present

## 2020-12-13 DIAGNOSIS — Z452 Encounter for adjustment and management of vascular access device: Secondary | ICD-10-CM | POA: Diagnosis not present

## 2020-12-13 DIAGNOSIS — Z4781 Encounter for orthopedic aftercare following surgical amputation: Secondary | ICD-10-CM | POA: Diagnosis not present

## 2020-12-13 DIAGNOSIS — L89150 Pressure ulcer of sacral region, unstageable: Secondary | ICD-10-CM | POA: Diagnosis not present

## 2020-12-13 DIAGNOSIS — E1151 Type 2 diabetes mellitus with diabetic peripheral angiopathy without gangrene: Secondary | ICD-10-CM | POA: Diagnosis not present

## 2020-12-13 DIAGNOSIS — E1142 Type 2 diabetes mellitus with diabetic polyneuropathy: Secondary | ICD-10-CM | POA: Diagnosis not present

## 2020-12-13 LAB — URINE CULTURE

## 2020-12-15 DIAGNOSIS — K219 Gastro-esophageal reflux disease without esophagitis: Secondary | ICD-10-CM | POA: Diagnosis not present

## 2020-12-15 DIAGNOSIS — L8990 Pressure ulcer of unspecified site, unspecified stage: Secondary | ICD-10-CM | POA: Diagnosis not present

## 2020-12-16 ENCOUNTER — Ambulatory Visit: Payer: Medicare Other | Admitting: Orthopedic Surgery

## 2020-12-17 DIAGNOSIS — E785 Hyperlipidemia, unspecified: Secondary | ICD-10-CM | POA: Diagnosis not present

## 2020-12-17 DIAGNOSIS — E1142 Type 2 diabetes mellitus with diabetic polyneuropathy: Secondary | ICD-10-CM | POA: Diagnosis not present

## 2020-12-17 DIAGNOSIS — M1990 Unspecified osteoarthritis, unspecified site: Secondary | ICD-10-CM | POA: Diagnosis not present

## 2020-12-17 DIAGNOSIS — Z4781 Encounter for orthopedic aftercare following surgical amputation: Secondary | ICD-10-CM | POA: Diagnosis not present

## 2020-12-17 DIAGNOSIS — I4891 Unspecified atrial fibrillation: Secondary | ICD-10-CM | POA: Diagnosis not present

## 2020-12-17 DIAGNOSIS — L89616 Pressure-induced deep tissue damage of right heel: Secondary | ICD-10-CM | POA: Diagnosis not present

## 2020-12-17 DIAGNOSIS — E1151 Type 2 diabetes mellitus with diabetic peripheral angiopathy without gangrene: Secondary | ICD-10-CM | POA: Diagnosis not present

## 2020-12-17 DIAGNOSIS — M797 Fibromyalgia: Secondary | ICD-10-CM | POA: Diagnosis not present

## 2020-12-17 DIAGNOSIS — I10 Essential (primary) hypertension: Secondary | ICD-10-CM | POA: Diagnosis not present

## 2020-12-17 DIAGNOSIS — I872 Venous insufficiency (chronic) (peripheral): Secondary | ICD-10-CM | POA: Diagnosis not present

## 2020-12-17 DIAGNOSIS — L89320 Pressure ulcer of left buttock, unstageable: Secondary | ICD-10-CM | POA: Diagnosis not present

## 2020-12-17 DIAGNOSIS — K219 Gastro-esophageal reflux disease without esophagitis: Secondary | ICD-10-CM | POA: Diagnosis not present

## 2020-12-17 DIAGNOSIS — E559 Vitamin D deficiency, unspecified: Secondary | ICD-10-CM | POA: Diagnosis not present

## 2020-12-18 ENCOUNTER — Other Ambulatory Visit: Payer: Self-pay | Admitting: Physician Assistant

## 2020-12-18 ENCOUNTER — Telehealth: Payer: Self-pay | Admitting: Orthopedic Surgery

## 2020-12-18 DIAGNOSIS — E1142 Type 2 diabetes mellitus with diabetic polyneuropathy: Secondary | ICD-10-CM | POA: Diagnosis not present

## 2020-12-18 DIAGNOSIS — E559 Vitamin D deficiency, unspecified: Secondary | ICD-10-CM | POA: Diagnosis not present

## 2020-12-18 DIAGNOSIS — I872 Venous insufficiency (chronic) (peripheral): Secondary | ICD-10-CM | POA: Diagnosis not present

## 2020-12-18 DIAGNOSIS — E785 Hyperlipidemia, unspecified: Secondary | ICD-10-CM | POA: Diagnosis not present

## 2020-12-18 DIAGNOSIS — M797 Fibromyalgia: Secondary | ICD-10-CM | POA: Diagnosis not present

## 2020-12-18 DIAGNOSIS — Z4781 Encounter for orthopedic aftercare following surgical amputation: Secondary | ICD-10-CM | POA: Diagnosis not present

## 2020-12-18 DIAGNOSIS — L89150 Pressure ulcer of sacral region, unstageable: Secondary | ICD-10-CM | POA: Diagnosis not present

## 2020-12-18 DIAGNOSIS — K219 Gastro-esophageal reflux disease without esophagitis: Secondary | ICD-10-CM | POA: Diagnosis not present

## 2020-12-18 DIAGNOSIS — L03116 Cellulitis of left lower limb: Secondary | ICD-10-CM | POA: Diagnosis not present

## 2020-12-18 DIAGNOSIS — E1151 Type 2 diabetes mellitus with diabetic peripheral angiopathy without gangrene: Secondary | ICD-10-CM | POA: Diagnosis not present

## 2020-12-18 DIAGNOSIS — M1990 Unspecified osteoarthritis, unspecified site: Secondary | ICD-10-CM | POA: Diagnosis not present

## 2020-12-18 DIAGNOSIS — I4891 Unspecified atrial fibrillation: Secondary | ICD-10-CM | POA: Diagnosis not present

## 2020-12-18 DIAGNOSIS — I10 Essential (primary) hypertension: Secondary | ICD-10-CM | POA: Diagnosis not present

## 2020-12-18 DIAGNOSIS — L89320 Pressure ulcer of left buttock, unstageable: Secondary | ICD-10-CM | POA: Diagnosis not present

## 2020-12-18 DIAGNOSIS — L89616 Pressure-induced deep tissue damage of right heel: Secondary | ICD-10-CM | POA: Diagnosis not present

## 2020-12-18 MED ORDER — METHOCARBAMOL 500 MG PO TABS: 500.0000 mg | ORAL_TABLET | Freq: Four times a day (QID) | ORAL | 0 refills | Status: DC | PRN

## 2020-12-18 NOTE — Telephone Encounter (Signed)
Pt needs a refill of her robaxin sent to her [facility] please; and would like to updated when it's sent.   [phone]

## 2020-12-18 NOTE — Telephone Encounter (Signed)
Pt is s/p a BKA 10/06/20 please see below and advise. Thanks

## 2020-12-18 NOTE — Telephone Encounter (Signed)
Robaxin refilled

## 2020-12-19 DIAGNOSIS — E782 Mixed hyperlipidemia: Secondary | ICD-10-CM | POA: Diagnosis not present

## 2020-12-19 DIAGNOSIS — J309 Allergic rhinitis, unspecified: Secondary | ICD-10-CM | POA: Diagnosis not present

## 2020-12-19 DIAGNOSIS — I10 Essential (primary) hypertension: Secondary | ICD-10-CM | POA: Diagnosis not present

## 2020-12-19 DIAGNOSIS — E1165 Type 2 diabetes mellitus with hyperglycemia: Secondary | ICD-10-CM | POA: Diagnosis not present

## 2020-12-19 NOTE — Telephone Encounter (Signed)
I called and sw daughter to advise rx has been sent to pharm.

## 2020-12-23 ENCOUNTER — Ambulatory Visit (INDEPENDENT_AMBULATORY_CARE_PROVIDER_SITE_OTHER): Payer: Medicare Other | Admitting: Orthopedic Surgery

## 2020-12-23 ENCOUNTER — Telehealth: Payer: Self-pay

## 2020-12-23 DIAGNOSIS — S88119A Complete traumatic amputation at level between knee and ankle, unspecified lower leg, initial encounter: Secondary | ICD-10-CM

## 2020-12-23 NOTE — Telephone Encounter (Signed)
[facility]  [facility] called she would like a call back about establishing pcp call [phone]

## 2020-12-24 NOTE — Telephone Encounter (Signed)
I called and sw [facility] she advised that pt did not keep the tel health visit that was scheduled for her. She also cx the appt for a social worker to come to the home and week on getting a provider service to come to the house. Pt and daughter advised that Dr. Lajoyce Corners agreed to take care of her PCP needs. I spoke with Dr. Lajoyce Corners and he did not agree to this. He gave an order for [facility] and HHPT also gave an order for clean catch urine and had advised that pt that she would require IV ABX and that she will have to do this through the hospital that he will not manage this for them. She needs to establish with a PCP. Did give an order for [facility] to pack sacral wound with santyl and 4x4. This was shared with [facility] she will call with any questions.

## 2020-12-25 ENCOUNTER — Encounter: Payer: Self-pay | Admitting: Orthopedic Surgery

## 2020-12-25 DIAGNOSIS — L89616 Pressure-induced deep tissue damage of right heel: Secondary | ICD-10-CM | POA: Diagnosis not present

## 2020-12-25 DIAGNOSIS — I10 Essential (primary) hypertension: Secondary | ICD-10-CM | POA: Diagnosis not present

## 2020-12-25 DIAGNOSIS — K219 Gastro-esophageal reflux disease without esophagitis: Secondary | ICD-10-CM | POA: Diagnosis not present

## 2020-12-25 DIAGNOSIS — M1990 Unspecified osteoarthritis, unspecified site: Secondary | ICD-10-CM | POA: Diagnosis not present

## 2020-12-25 DIAGNOSIS — E559 Vitamin D deficiency, unspecified: Secondary | ICD-10-CM | POA: Diagnosis not present

## 2020-12-25 DIAGNOSIS — M797 Fibromyalgia: Secondary | ICD-10-CM | POA: Diagnosis not present

## 2020-12-25 DIAGNOSIS — Z4781 Encounter for orthopedic aftercare following surgical amputation: Secondary | ICD-10-CM | POA: Diagnosis not present

## 2020-12-25 DIAGNOSIS — Z7689 Persons encountering health services in other specified circumstances: Secondary | ICD-10-CM | POA: Diagnosis not present

## 2020-12-25 DIAGNOSIS — I872 Venous insufficiency (chronic) (peripheral): Secondary | ICD-10-CM | POA: Diagnosis not present

## 2020-12-25 DIAGNOSIS — L89320 Pressure ulcer of left buttock, unstageable: Secondary | ICD-10-CM | POA: Diagnosis not present

## 2020-12-25 DIAGNOSIS — E785 Hyperlipidemia, unspecified: Secondary | ICD-10-CM | POA: Diagnosis not present

## 2020-12-25 DIAGNOSIS — E1151 Type 2 diabetes mellitus with diabetic peripheral angiopathy without gangrene: Secondary | ICD-10-CM | POA: Diagnosis not present

## 2020-12-25 DIAGNOSIS — I4891 Unspecified atrial fibrillation: Secondary | ICD-10-CM | POA: Diagnosis not present

## 2020-12-25 DIAGNOSIS — E1142 Type 2 diabetes mellitus with diabetic polyneuropathy: Secondary | ICD-10-CM | POA: Diagnosis not present

## 2020-12-26 ENCOUNTER — Encounter: Payer: Self-pay | Admitting: Orthopedic Surgery

## 2020-12-26 NOTE — Progress Notes (Signed)
Office Visit Note   Patient: Yesenia Payne           Date of Birth: Mar 27, 1951           MRN: [id number] Visit Date: 12/23/2020              Requested by: Merri Brunette, MD [phone] [place],  [place] [address] PCP: Patient, No Pcp Per  Chief Complaint  Patient presents with  . Left Leg - Routine Post Op    10/06/20 left BKA       HPI: Patient is a 70 year old woman who is 2-1/2 months status post left transtibial amputation she is transported by EMS on a stretcher patient previously has had a heel ulcer on the right.  Assessment & Plan: Visit Diagnoses:  1. Below knee amputation [facility])     Plan: Orders were written for home health nursing to continue wound care for the sacral ulcer and physical therapy for progressive ambulation.  Discussed that if she can stand on her right leg we could set her up for a prosthesis.  Orders were also written for a repeat urine analysis.  Discussed with patient's daughter if the UTI requires IV antibiotics she would need to be hospitalized for this treatment.  Patient was recently treated with Levaquin and Zosyn for a urinary tract infection that included Pseudomonas and multiple organisms.  Follow-Up Instructions: Return in about 4 weeks (around 01/20/2021).   Ortho Exam  Patient is alert, oriented, no adenopathy, well-dressed, normal affect, normal respiratory effort. Examination of the right heel ulcer has healed the left transtibial amputation has healed as well.  Patient's daughter states that she is having cloudy discolored urine.  Imaging: No results found. No images are attached to the encounter.  Labs: Lab Results  Component Value Date   HGBA1C 7.2 (H) 10/04/2020   ESRSEDRATE 105 (H) 10/04/2020   CRP 26.7 (H) 10/04/2020   REPTSTATUS 12/13/2020 FINAL 12/11/2020   GRAMSTAIN  10/04/2020    NO WBC SEEN MODERATE GRAM POSITIVE COCCI IN PAIRS MODERATE GRAM NEGATIVE RODS Performed at [facility]  Lab, [address], [place], [place] [address]    CULT MULTIPLE SPECIES PRESENT, SUGGEST RECOLLECTION (A) 12/11/2020   LABORGA PROTEUS MIRABILIS 10/04/2020   LABORGA STAPHYLOCOCCUS PSEUDINTERMEDIUS 10/04/2020     Lab Results  Component Value Date   ALBUMIN 2.9 (L) 12/11/2020   ALBUMIN 1.6 (L) 10/13/2020   ALBUMIN 1.6 (L) 10/12/2020    Lab Results  Component Value Date   MG 1.7 10/14/2020   MG 1.9 10/13/2020   MG 1.9 10/12/2020   No results found for: VD25OH  No results found for: PREALBUMIN CBC EXTENDED Latest Ref Rng & Units 12/11/2020 10/14/2020 10/13/2020  WBC 4.0 - 10.5 K/uL 14.2(H) 9.9 7.7  RBC 3.87 - 5.11 MIL/uL 5.09 3.74(L) 3.59(L)  HGB 12.0 - 15.0 g/dL 54.6 10.3(L) 10.2(L)  HCT 36.0 - 46.0 % 45.3 34.0(L) 32.1(L)  PLT 150 - 400 K/uL 277 330 306  NEUTROABS 1.7 - 7.7 K/uL - 7.5 5.4  LYMPHSABS 0.7 - 4.0 K/uL - 1.5 1.4     There is no height or weight on file to calculate BMI.  Orders:  No orders of the defined types were placed in this encounter.  No orders of the defined types were placed in this encounter.    Procedures: No procedures performed  Clinical Data: No additional findings.  ROS:  All other systems negative, except as noted in the HPI.  Review of Systems  Objective: Vital Signs: There were no vitals taken for this visit.  Specialty Comments:  No specialty comments available.  PMFS History: Patient Active Problem List   Diagnosis Date Noted  . Pseudomonas urinary tract infection 12/12/2020  . S/P BKA (below knee amputation) unilateral, left (HCC) 12/12/2020  . Leukocytosis 12/12/2020  . Lower urinary tract infectious disease 12/12/2020  . Morbidly obese (HCC) 10/09/2020  . Sepsis (HCC) 10/05/2020  . Pressure injury of skin 10/05/2020  . Cutaneous abscess of left foot   . Anemia   . Necrotizing cellulitis 10/04/2020  . DOE (dyspnea on exertion) 01/18/2017  . Benign essential HTN 01/18/2017  . PAC (premature atrial contraction) 01/18/2017    Past Medical History:  Diagnosis Date  . Acid reflux   . Asthma   . Chronic headache   . Diabetes mellitus without complication (HCC)   . Diverticular disease   . Diverticulitis   . Dyslipidemia   . Fibromyalgia   . Hyperlipidemia   . Hypertension   . IBS (irritable bowel syndrome)   . Insomnia   . Irregular heartbeat   . Low back pain   . Morbid obesity (HCC)   . Neuropathy    distal peripheral  . Osteoarthritis   . PAC (premature atrial contraction) 01/18/2017  . Urinary incontinence   . Vitamin D deficiency     Family History  Problem Relation Age of Onset  . Hypertension Other   . Hypertension Mother   . Hyperlipidemia Mother   . Heart attack Father   . Diabetes Father   . CAD Father   . Neuropathy Father   . Diabetes Paternal Grandfather   . Cancer - Other Sister        uterine cancer  . Mental retardation Brother   . Leukemia Sister   . Heart attack Sister   . Other Daughter        ruptured colon x2    Past Surgical History:  Procedure Laterality Date  . AMPUTATION Left 10/06/2020   Procedure: AMPUTATION BELOW KNEE;  Surgeon: [name], MD;  Location[facility] OR;  Service: Orthopedics;  Laterality: Left;  . arthoscopics    . BACK SURGERY     L4 & L5  . CESAREAN SECTION    . CESAREAN SECTION    . colonscopy  2009  . Left Knee Surgery  1993  . Right knee surgery  1990  . TOTAL KNEE ARTHROPLASTY     Social History   Occupational History  . Not on file  Tobacco Use  . Smoking status: Never Smoker  . Smokeless tobacco: Never Used  Substance and Sexual Activity  . Alcohol use: No    Alcohol/week: 0.0 standard drinks  . Drug use: No  . Sexual activity: Never

## 2021-01-01 DIAGNOSIS — E1151 Type 2 diabetes mellitus with diabetic peripheral angiopathy without gangrene: Secondary | ICD-10-CM | POA: Diagnosis not present

## 2021-01-01 DIAGNOSIS — I872 Venous insufficiency (chronic) (peripheral): Secondary | ICD-10-CM | POA: Diagnosis not present

## 2021-01-01 DIAGNOSIS — E1142 Type 2 diabetes mellitus with diabetic polyneuropathy: Secondary | ICD-10-CM | POA: Diagnosis not present

## 2021-01-01 DIAGNOSIS — I10 Essential (primary) hypertension: Secondary | ICD-10-CM | POA: Diagnosis not present

## 2021-01-01 DIAGNOSIS — E785 Hyperlipidemia, unspecified: Secondary | ICD-10-CM | POA: Diagnosis not present

## 2021-01-01 DIAGNOSIS — L89320 Pressure ulcer of left buttock, unstageable: Secondary | ICD-10-CM | POA: Diagnosis not present

## 2021-01-01 DIAGNOSIS — M797 Fibromyalgia: Secondary | ICD-10-CM | POA: Diagnosis not present

## 2021-01-01 DIAGNOSIS — L89616 Pressure-induced deep tissue damage of right heel: Secondary | ICD-10-CM | POA: Diagnosis not present

## 2021-01-01 DIAGNOSIS — Z4781 Encounter for orthopedic aftercare following surgical amputation: Secondary | ICD-10-CM | POA: Diagnosis not present

## 2021-01-01 DIAGNOSIS — M1990 Unspecified osteoarthritis, unspecified site: Secondary | ICD-10-CM | POA: Diagnosis not present

## 2021-01-01 DIAGNOSIS — E559 Vitamin D deficiency, unspecified: Secondary | ICD-10-CM | POA: Diagnosis not present

## 2021-01-01 DIAGNOSIS — K219 Gastro-esophageal reflux disease without esophagitis: Secondary | ICD-10-CM | POA: Diagnosis not present

## 2021-01-01 DIAGNOSIS — I4891 Unspecified atrial fibrillation: Secondary | ICD-10-CM | POA: Diagnosis not present

## 2021-01-02 ENCOUNTER — Other Ambulatory Visit: Payer: Self-pay | Admitting: Orthopedic Surgery

## 2021-01-02 ENCOUNTER — Telehealth: Payer: Self-pay | Admitting: Orthopedic Surgery

## 2021-01-02 MED ORDER — LEVOFLOXACIN 500 MG PO TABS: 500.0000 mg | ORAL_TABLET | Freq: Every day | ORAL | 0 refills | Status: AC

## 2021-01-02 NOTE — Telephone Encounter (Signed)
HHN is making family aware. Will call with any questions.

## 2021-01-02 NOTE — Telephone Encounter (Signed)
Called and sw HHN urine culture came back with proteus mirabilis and is sensitive to Levaquin which the pt has taken and tolerated before. Can you please call in rx for this to [facility] for pt?

## 2021-01-02 NOTE — Telephone Encounter (Signed)
Rx sent 

## 2021-01-02 NOTE — Telephone Encounter (Signed)
Mervin Kung (RN) from [facility] called asking for a call back concerning patient. Mervin Kung states they did cultures and UA on patient and it came back with bacteria traces in urine. Mervin Kung needs to know how Dr. Lajoyce Corners would like to treat patient. Please call Starla at [phone].

## 2021-01-03 DIAGNOSIS — E119 Type 2 diabetes mellitus without complications: Secondary | ICD-10-CM | POA: Diagnosis not present

## 2021-01-03 DIAGNOSIS — E785 Hyperlipidemia, unspecified: Secondary | ICD-10-CM | POA: Diagnosis not present

## 2021-01-03 DIAGNOSIS — I10 Essential (primary) hypertension: Secondary | ICD-10-CM | POA: Diagnosis not present

## 2021-01-03 DIAGNOSIS — Z89519 Acquired absence of unspecified leg below knee: Secondary | ICD-10-CM | POA: Diagnosis not present

## 2021-01-03 DIAGNOSIS — Z0000 Encounter for general adult medical examination without abnormal findings: Secondary | ICD-10-CM | POA: Diagnosis not present

## 2021-01-08 ENCOUNTER — Telehealth: Payer: Self-pay

## 2021-01-08 ENCOUNTER — Telehealth: Payer: Self-pay | Admitting: Orthopedic Surgery

## 2021-01-08 ENCOUNTER — Other Ambulatory Visit: Payer: Self-pay | Admitting: Physician Assistant

## 2021-01-08 DIAGNOSIS — L89150 Pressure ulcer of sacral region, unstageable: Secondary | ICD-10-CM | POA: Diagnosis not present

## 2021-01-08 DIAGNOSIS — Z4781 Encounter for orthopedic aftercare following surgical amputation: Secondary | ICD-10-CM | POA: Diagnosis not present

## 2021-01-08 DIAGNOSIS — L03116 Cellulitis of left lower limb: Secondary | ICD-10-CM | POA: Diagnosis not present

## 2021-01-08 MED ORDER — METHOCARBAMOL 500 MG PO TABS: 500.0000 mg | ORAL_TABLET | Freq: Four times a day (QID) | ORAL | 0 refills | Status: DC | PRN

## 2021-01-08 NOTE — Telephone Encounter (Signed)
done

## 2021-01-08 NOTE — Telephone Encounter (Signed)
Patient's daughter Efraim Kaufmann called requesting refill on behalf of her mother for a refill of methocarbamol. Please send to [facility]. Daughter's phone number is [phone].

## 2021-01-08 NOTE — Telephone Encounter (Signed)
Olegario Messier with [facility] called concerning Home Health plan of care and physicians order needing to be signed.  Stated that it was faxed to the office 3 times starting back in December, 2021.  CB#[phone].  Please Advise.  Thank you.

## 2021-01-09 DIAGNOSIS — L8990 Pressure ulcer of unspecified site, unspecified stage: Secondary | ICD-10-CM | POA: Diagnosis not present

## 2021-01-09 DIAGNOSIS — L02612 Cutaneous abscess of left foot: Secondary | ICD-10-CM | POA: Diagnosis not present

## 2021-01-09 NOTE — Telephone Encounter (Signed)
IC, she needed TELMR#[id number] & [id number]. Both signed and faxed to her ph/fax [phone]

## 2021-01-09 NOTE — Telephone Encounter (Signed)
He signs everything that we get and I have nothing pending for him can you help with this?

## 2021-01-12 DIAGNOSIS — L8990 Pressure ulcer of unspecified site, unspecified stage: Secondary | ICD-10-CM | POA: Diagnosis not present

## 2021-01-12 DIAGNOSIS — K219 Gastro-esophageal reflux disease without esophagitis: Secondary | ICD-10-CM | POA: Diagnosis not present

## 2021-01-15 ENCOUNTER — Telehealth: Payer: Self-pay | Admitting: Orthopedic Surgery

## 2021-01-15 DIAGNOSIS — L89320 Pressure ulcer of left buttock, unstageable: Secondary | ICD-10-CM | POA: Diagnosis not present

## 2021-01-15 DIAGNOSIS — I872 Venous insufficiency (chronic) (peripheral): Secondary | ICD-10-CM | POA: Diagnosis not present

## 2021-01-15 DIAGNOSIS — L89616 Pressure-induced deep tissue damage of right heel: Secondary | ICD-10-CM | POA: Diagnosis not present

## 2021-01-15 DIAGNOSIS — E785 Hyperlipidemia, unspecified: Secondary | ICD-10-CM | POA: Diagnosis not present

## 2021-01-15 DIAGNOSIS — M1990 Unspecified osteoarthritis, unspecified site: Secondary | ICD-10-CM | POA: Diagnosis not present

## 2021-01-15 DIAGNOSIS — E559 Vitamin D deficiency, unspecified: Secondary | ICD-10-CM | POA: Diagnosis not present

## 2021-01-15 DIAGNOSIS — K219 Gastro-esophageal reflux disease without esophagitis: Secondary | ICD-10-CM | POA: Diagnosis not present

## 2021-01-15 DIAGNOSIS — E1151 Type 2 diabetes mellitus with diabetic peripheral angiopathy without gangrene: Secondary | ICD-10-CM | POA: Diagnosis not present

## 2021-01-15 DIAGNOSIS — Z4781 Encounter for orthopedic aftercare following surgical amputation: Secondary | ICD-10-CM | POA: Diagnosis not present

## 2021-01-15 DIAGNOSIS — I10 Essential (primary) hypertension: Secondary | ICD-10-CM | POA: Diagnosis not present

## 2021-01-15 DIAGNOSIS — M797 Fibromyalgia: Secondary | ICD-10-CM | POA: Diagnosis not present

## 2021-01-15 DIAGNOSIS — I4891 Unspecified atrial fibrillation: Secondary | ICD-10-CM | POA: Diagnosis not present

## 2021-01-15 DIAGNOSIS — E1142 Type 2 diabetes mellitus with diabetic polyneuropathy: Secondary | ICD-10-CM | POA: Diagnosis not present

## 2021-01-15 NOTE — Telephone Encounter (Signed)
Called and sw Theron Arista to advise verbal ok for orders as requested below. To call with any questions.

## 2021-01-15 NOTE — Telephone Encounter (Signed)
Peter from [facility] called. He would like verbal orders for 2x wk 4wks. His call back number is [phone]

## 2021-01-19 ENCOUNTER — Telehealth: Payer: Self-pay | Admitting: Orthopedic Surgery

## 2021-01-19 NOTE — Telephone Encounter (Signed)
Pt would like to know if she could get a medical transport order? She lost the other one. She was just going to get it when she came in tomorrow!

## 2021-01-20 ENCOUNTER — Ambulatory Visit: Payer: Medicare Other | Admitting: Orthopedic Surgery

## 2021-01-20 NOTE — Telephone Encounter (Signed)
Will address at office visit today

## 2021-01-22 DIAGNOSIS — I4891 Unspecified atrial fibrillation: Secondary | ICD-10-CM | POA: Diagnosis not present

## 2021-01-22 DIAGNOSIS — M1990 Unspecified osteoarthritis, unspecified site: Secondary | ICD-10-CM | POA: Diagnosis not present

## 2021-01-22 DIAGNOSIS — L89320 Pressure ulcer of left buttock, unstageable: Secondary | ICD-10-CM | POA: Diagnosis not present

## 2021-01-22 DIAGNOSIS — E1142 Type 2 diabetes mellitus with diabetic polyneuropathy: Secondary | ICD-10-CM | POA: Diagnosis not present

## 2021-01-22 DIAGNOSIS — E785 Hyperlipidemia, unspecified: Secondary | ICD-10-CM | POA: Diagnosis not present

## 2021-01-22 DIAGNOSIS — I10 Essential (primary) hypertension: Secondary | ICD-10-CM | POA: Diagnosis not present

## 2021-01-22 DIAGNOSIS — Z4781 Encounter for orthopedic aftercare following surgical amputation: Secondary | ICD-10-CM | POA: Diagnosis not present

## 2021-01-22 DIAGNOSIS — M797 Fibromyalgia: Secondary | ICD-10-CM | POA: Diagnosis not present

## 2021-01-22 DIAGNOSIS — L89616 Pressure-induced deep tissue damage of right heel: Secondary | ICD-10-CM | POA: Diagnosis not present

## 2021-01-22 DIAGNOSIS — E559 Vitamin D deficiency, unspecified: Secondary | ICD-10-CM | POA: Diagnosis not present

## 2021-01-22 DIAGNOSIS — K219 Gastro-esophageal reflux disease without esophagitis: Secondary | ICD-10-CM | POA: Diagnosis not present

## 2021-01-22 DIAGNOSIS — E1151 Type 2 diabetes mellitus with diabetic peripheral angiopathy without gangrene: Secondary | ICD-10-CM | POA: Diagnosis not present

## 2021-01-22 DIAGNOSIS — I872 Venous insufficiency (chronic) (peripheral): Secondary | ICD-10-CM | POA: Diagnosis not present

## 2021-01-26 ENCOUNTER — Encounter: Payer: Self-pay | Admitting: Orthopedic Surgery

## 2021-01-26 ENCOUNTER — Ambulatory Visit (INDEPENDENT_AMBULATORY_CARE_PROVIDER_SITE_OTHER): Payer: Medicare Other | Admitting: Orthopedic Surgery

## 2021-01-26 DIAGNOSIS — S7012XA Contusion of left thigh, initial encounter: Secondary | ICD-10-CM | POA: Diagnosis not present

## 2021-01-26 DIAGNOSIS — Z89512 Acquired absence of left leg below knee: Secondary | ICD-10-CM | POA: Diagnosis not present

## 2021-01-26 DIAGNOSIS — S88119A Complete traumatic amputation at level between knee and ankle, unspecified lower leg, initial encounter: Secondary | ICD-10-CM

## 2021-01-26 MED ORDER — METHOCARBAMOL 500 MG PO TABS: 500.0000 mg | ORAL_TABLET | Freq: Three times a day (TID) | ORAL | 0 refills | Status: DC | PRN

## 2021-01-26 NOTE — Progress Notes (Signed)
Office Visit Note   Patient: Yesenia Payne           Date of Birth: Aug 16, 1951           MRN: [id number] Visit Date: 01/26/2021              Requested by: No referring provider defined for this encounter. PCP: Patient, No Pcp Per  Chief Complaint  Patient presents with  . Left Knee - Follow-up  . Left Thigh - Pain      HPI: Patient is a 70 year old woman who is cared for by family at home.  She is currently a Nurse, adult for all transfers.  Patient has had some difficulty working with physical therapy at home and has developed a large bruise over the medial aspect of her left thigh.  Assessment & Plan: Visit Diagnoses:  1. Below knee amputation (HCC)   2. Contusion of left thigh, initial encounter     Plan: Recommended observation patient does not have any symptoms consistent with infection or DVT.  Area of bruising most likely secondary to the massive swelling of the left thigh.  Continue with current care work on therapy for upper extremity strengthening on her own at home.  Prescription sent for Robaxin.  Follow-Up Instructions: Return if symptoms worsen or fail to improve.   Ortho Exam  Patient is alert, oriented, no adenopathy, well-dressed, normal affect, normal respiratory effort. Examination patient's left below the knee amputation is healed well there is no drainage no cellulitis there is minimal swelling.  Patient has developed massive swelling in the left thigh there is bruising medially.  There is no tenderness to palpation there is no cellulitis no drainage no odor no clinical signs of DVT or infection.  Patient complains of muscle spasms that have not been relieved with Neurontin she states the Robaxin does help.  Imaging: No results found. No images are attached to the encounter.  Labs: Lab Results  Component Value Date   HGBA1C 7.2 (H) 10/04/2020   ESRSEDRATE 105 (H) 10/04/2020   CRP 26.7 (H) 10/04/2020   REPTSTATUS 12/13/2020 FINAL 12/11/2020    GRAMSTAIN  10/04/2020    NO WBC SEEN MODERATE GRAM POSITIVE COCCI IN PAIRS MODERATE GRAM NEGATIVE RODS Performed at [facility], [address], [place], [place] [address]    CULT MULTIPLE SPECIES PRESENT, SUGGEST RECOLLECTION (A) 12/11/2020   LABORGA PROTEUS MIRABILIS 10/04/2020   LABORGA STAPHYLOCOCCUS PSEUDINTERMEDIUS 10/04/2020     Lab Results  Component Value Date   ALBUMIN 2.9 (L) 12/11/2020   ALBUMIN 1.6 (L) 10/13/2020   ALBUMIN 1.6 (L) 10/12/2020    Lab Results  Component Value Date   MG 1.7 10/14/2020   MG 1.9 10/13/2020   MG 1.9 10/12/2020   No results found for: VD25OH  No results found for: PREALBUMIN CBC EXTENDED Latest Ref Rng & Units 12/11/2020 10/14/2020 10/13/2020  WBC 4.0 - 10.5 K/uL 14.2(H) 9.9 7.7  RBC 3.87 - 5.11 MIL/uL 5.09 3.74(L) 3.59(L)  HGB 12.0 - 15.0 g/dL 06.2 10.3(L) 10.2(L)  HCT 36.0 - 46.0 % 45.3 34.0(L) 32.1(L)  PLT 150 - 400 K/uL 277 330 306  NEUTROABS 1.7 - 7.7 K/uL - 7.5 5.4  LYMPHSABS 0.7 - 4.0 K/uL - 1.5 1.4     There is no height or weight on file to calculate BMI.  Orders:  No orders of the defined types were placed in this encounter.  No orders of the defined types were placed in this encounter.  Procedures: No procedures performed  Clinical Data: No additional findings.  ROS:  All other systems negative, except as noted in the HPI. Review of Systems  Objective: Vital Signs: There were no vitals taken for this visit.  Specialty Comments:  No specialty comments available.  PMFS History: Patient Active Problem List   Diagnosis Date Noted  . Pseudomonas urinary tract infection 12/12/2020  . S/P BKA (below knee amputation) unilateral, left (HCC) 12/12/2020  . Leukocytosis 12/12/2020  . Lower urinary tract infectious disease 12/12/2020  . Morbidly obese (HCC) 10/09/2020  . Sepsis (HCC) 10/05/2020  . Pressure injury of skin 10/05/2020  . Cutaneous abscess of left foot   . Anemia   . Necrotizing  cellulitis 10/04/2020  . DOE (dyspnea on exertion) 01/18/2017  . Benign essential HTN 01/18/2017  . PAC (premature atrial contraction) 01/18/2017   Past Medical History:  Diagnosis Date  . Acid reflux   . Asthma   . Chronic headache   . Diabetes mellitus without complication (HCC)   . Diverticular disease   . Diverticulitis   . Dyslipidemia   . Fibromyalgia   . Hyperlipidemia   . Hypertension   . IBS (irritable bowel syndrome)   . Insomnia   . Irregular heartbeat   . Low back pain   . Morbid obesity (HCC)   . Neuropathy    distal peripheral  . Osteoarthritis   . PAC (premature atrial contraction) 01/18/2017  . Urinary incontinence   . Vitamin D deficiency     Family History  Problem Relation Age of Onset  . Hypertension Other   . Hypertension Mother   . Hyperlipidemia Mother   . Heart attack Father   . Diabetes Father   . CAD Father   . Neuropathy Father   . Diabetes Paternal Grandfather   . Cancer - Other Sister        uterine cancer  . Mental retardation Brother   . Leukemia Sister   . Heart attack Sister   . Other Daughter        ruptured colon x2    Past Surgical History:  Procedure Laterality Date  . AMPUTATION Left 10/06/2020   Procedure: AMPUTATION BELOW KNEE;  Surgeon: [name], MD;  Location[facility];  Service: Orthopedics;  Laterality: Left;  . arthoscopics    . BACK SURGERY     L4 & L5  . CESAREAN SECTION    . CESAREAN SECTION    . colonscopy  2009  . Left Knee Surgery  1993  . Right knee surgery  1990  . TOTAL KNEE ARTHROPLASTY     Social History   Occupational History  . Not on file  Tobacco Use  . Smoking status: Never Smoker  . Smokeless tobacco: Never Used  Substance and Sexual Activity  . Alcohol use: No    Alcohol/week: 0.0 standard drinks  . Drug use: No  . Sexual activity: Never

## 2021-01-27 ENCOUNTER — Telehealth: Payer: Self-pay | Admitting: Orthopedic Surgery

## 2021-01-27 NOTE — Telephone Encounter (Signed)
Noted  

## 2021-01-27 NOTE — Telephone Encounter (Signed)
Deana from home health called state daughter wants her mother to stop doing physical therapy so they discharged her. Cb [phone]

## 2021-01-28 ENCOUNTER — Telehealth: Payer: Self-pay | Admitting: Orthopedic Surgery

## 2021-01-28 NOTE — Telephone Encounter (Signed)
Noted  

## 2021-01-28 NOTE — Telephone Encounter (Signed)
[facility] called stating the pt refused PT and so she was discharged on 01/27/21 and if Dr. Lajoyce Corners has any questions Theron Arista can be reached @   [phone]

## 2021-01-31 DIAGNOSIS — E119 Type 2 diabetes mellitus without complications: Secondary | ICD-10-CM | POA: Diagnosis not present

## 2021-01-31 DIAGNOSIS — I10 Essential (primary) hypertension: Secondary | ICD-10-CM | POA: Diagnosis not present

## 2021-01-31 DIAGNOSIS — E785 Hyperlipidemia, unspecified: Secondary | ICD-10-CM | POA: Diagnosis not present

## 2021-02-04 DIAGNOSIS — L02612 Cutaneous abscess of left foot: Secondary | ICD-10-CM | POA: Diagnosis not present

## 2021-02-04 DIAGNOSIS — L8990 Pressure ulcer of unspecified site, unspecified stage: Secondary | ICD-10-CM | POA: Diagnosis not present

## 2021-02-09 DIAGNOSIS — L8990 Pressure ulcer of unspecified site, unspecified stage: Secondary | ICD-10-CM | POA: Diagnosis not present

## 2021-02-09 DIAGNOSIS — L02612 Cutaneous abscess of left foot: Secondary | ICD-10-CM | POA: Diagnosis not present

## 2021-02-12 ENCOUNTER — Telehealth: Payer: Self-pay | Admitting: Orthopedic Surgery

## 2021-02-12 DIAGNOSIS — K219 Gastro-esophageal reflux disease without esophagitis: Secondary | ICD-10-CM | POA: Diagnosis not present

## 2021-02-12 DIAGNOSIS — L8990 Pressure ulcer of unspecified site, unspecified stage: Secondary | ICD-10-CM | POA: Diagnosis not present

## 2021-02-12 NOTE — Telephone Encounter (Signed)
Received call from [facility]. She stated she never received orders that I faxed on 3/4. Also, she needed order# [id number] 3/10 re-eval. I got her email address since apparent issue receiving my faxes. email [email or web address]. also did fax as she requested [phone]/which is also phone

## 2021-02-13 DIAGNOSIS — E1165 Type 2 diabetes mellitus with hyperglycemia: Secondary | ICD-10-CM | POA: Diagnosis not present

## 2021-02-13 DIAGNOSIS — L89159 Pressure ulcer of sacral region, unspecified stage: Secondary | ICD-10-CM | POA: Diagnosis not present

## 2021-02-13 DIAGNOSIS — Z7189 Other specified counseling: Secondary | ICD-10-CM | POA: Diagnosis not present

## 2021-02-13 DIAGNOSIS — L03119 Cellulitis of unspecified part of limb: Secondary | ICD-10-CM | POA: Diagnosis not present

## 2021-02-13 DIAGNOSIS — L299 Pruritus, unspecified: Secondary | ICD-10-CM | POA: Diagnosis not present

## 2021-03-03 DIAGNOSIS — G894 Chronic pain syndrome: Secondary | ICD-10-CM | POA: Diagnosis not present

## 2021-03-03 DIAGNOSIS — E1142 Type 2 diabetes mellitus with diabetic polyneuropathy: Secondary | ICD-10-CM | POA: Diagnosis not present

## 2021-03-03 DIAGNOSIS — M961 Postlaminectomy syndrome, not elsewhere classified: Secondary | ICD-10-CM | POA: Diagnosis not present

## 2021-03-07 DIAGNOSIS — L8990 Pressure ulcer of unspecified site, unspecified stage: Secondary | ICD-10-CM | POA: Diagnosis not present

## 2021-03-07 DIAGNOSIS — L02612 Cutaneous abscess of left foot: Secondary | ICD-10-CM | POA: Diagnosis not present

## 2021-03-11 ENCOUNTER — Other Ambulatory Visit: Payer: Self-pay | Admitting: Physician Assistant

## 2021-03-11 ENCOUNTER — Telehealth: Payer: Self-pay | Admitting: Orthopedic Surgery

## 2021-03-11 DIAGNOSIS — L02612 Cutaneous abscess of left foot: Secondary | ICD-10-CM | POA: Diagnosis not present

## 2021-03-11 DIAGNOSIS — L8990 Pressure ulcer of unspecified site, unspecified stage: Secondary | ICD-10-CM | POA: Diagnosis not present

## 2021-03-11 MED ORDER — METHOCARBAMOL 500 MG PO TABS: 500.0000 mg | ORAL_TABLET | Freq: Three times a day (TID) | ORAL | 0 refills | Status: DC | PRN

## 2021-03-11 NOTE — Telephone Encounter (Signed)
Pt called and needs a refill on methocarbamol (ROBAXIN) 500 MG tablet [[id number]]

## 2021-03-11 NOTE — Telephone Encounter (Signed)
done

## 2021-03-14 DIAGNOSIS — I10 Essential (primary) hypertension: Secondary | ICD-10-CM | POA: Diagnosis not present

## 2021-03-14 DIAGNOSIS — E1165 Type 2 diabetes mellitus with hyperglycemia: Secondary | ICD-10-CM | POA: Diagnosis not present

## 2021-03-14 DIAGNOSIS — B372 Candidiasis of skin and nail: Secondary | ICD-10-CM | POA: Diagnosis not present

## 2021-03-14 DIAGNOSIS — L89159 Pressure ulcer of sacral region, unspecified stage: Secondary | ICD-10-CM | POA: Diagnosis not present

## 2021-03-14 DIAGNOSIS — L8990 Pressure ulcer of unspecified site, unspecified stage: Secondary | ICD-10-CM | POA: Diagnosis not present

## 2021-03-14 DIAGNOSIS — K219 Gastro-esophageal reflux disease without esophagitis: Secondary | ICD-10-CM | POA: Diagnosis not present

## 2021-03-21 DIAGNOSIS — G894 Chronic pain syndrome: Secondary | ICD-10-CM | POA: Diagnosis not present

## 2021-03-21 DIAGNOSIS — I10 Essential (primary) hypertension: Secondary | ICD-10-CM | POA: Diagnosis not present

## 2021-03-21 DIAGNOSIS — L89159 Pressure ulcer of sacral region, unspecified stage: Secondary | ICD-10-CM | POA: Diagnosis not present

## 2021-03-21 DIAGNOSIS — E1165 Type 2 diabetes mellitus with hyperglycemia: Secondary | ICD-10-CM | POA: Diagnosis not present

## 2021-03-27 ENCOUNTER — Encounter (HOSPITAL_COMMUNITY): Payer: Self-pay | Admitting: Emergency Medicine

## 2021-03-27 ENCOUNTER — Emergency Department (HOSPITAL_COMMUNITY): Payer: Medicare Other

## 2021-03-27 ENCOUNTER — Other Ambulatory Visit: Payer: Self-pay

## 2021-03-27 ENCOUNTER — Emergency Department (HOSPITAL_COMMUNITY)
Admission: EM | Admit: 2021-03-27 | Discharge: 2021-03-28 | Disposition: A | Discharge status: Home / Self Care | Payer: Medicare Other | Attending: Emergency Medicine | Admitting: Emergency Medicine

## 2021-03-27 DIAGNOSIS — K5900 Constipation, unspecified: Secondary | ICD-10-CM | POA: Diagnosis not present

## 2021-03-27 DIAGNOSIS — R110 Nausea: Secondary | ICD-10-CM | POA: Diagnosis not present

## 2021-03-27 DIAGNOSIS — I10 Essential (primary) hypertension: Secondary | ICD-10-CM | POA: Diagnosis not present

## 2021-03-27 DIAGNOSIS — N390 Urinary tract infection, site not specified: Secondary | ICD-10-CM | POA: Diagnosis not present

## 2021-03-27 DIAGNOSIS — Z794 Long term (current) use of insulin: Secondary | ICD-10-CM | POA: Insufficient documentation

## 2021-03-27 DIAGNOSIS — J45909 Unspecified asthma, uncomplicated: Secondary | ICD-10-CM | POA: Diagnosis not present

## 2021-03-27 DIAGNOSIS — R109 Unspecified abdominal pain: Secondary | ICD-10-CM | POA: Diagnosis not present

## 2021-03-27 DIAGNOSIS — Z743 Need for continuous supervision: Secondary | ICD-10-CM | POA: Diagnosis not present

## 2021-03-27 DIAGNOSIS — Z7951 Long term (current) use of inhaled steroids: Secondary | ICD-10-CM | POA: Diagnosis not present

## 2021-03-27 DIAGNOSIS — R10814 Left lower quadrant abdominal tenderness: Secondary | ICD-10-CM | POA: Insufficient documentation

## 2021-03-27 DIAGNOSIS — Z96652 Presence of left artificial knee joint: Secondary | ICD-10-CM | POA: Diagnosis not present

## 2021-03-27 DIAGNOSIS — E119 Type 2 diabetes mellitus without complications: Secondary | ICD-10-CM | POA: Insufficient documentation

## 2021-03-27 DIAGNOSIS — R5381 Other malaise: Secondary | ICD-10-CM | POA: Diagnosis not present

## 2021-03-27 LAB — CBC WITH DIFFERENTIAL/PLATELET
Abs Immature Granulocytes: 0.56 10*3/uL — ABNORMAL HIGH (ref 0.00–0.07)
Basophils Absolute: 0.2 10*3/uL — ABNORMAL HIGH (ref 0.0–0.1)
Basophils Relative: 1 %
Eosinophils Absolute: 0.1 10*3/uL (ref 0.0–0.5)
Eosinophils Relative: 1 %
HCT: 40.9 % (ref 36.0–46.0)
Hemoglobin: 13.3 g/dL (ref 12.0–15.0)
Immature Granulocytes: 3 %
Lymphocytes Relative: 8 %
Lymphs Abs: 1.5 10*3/uL (ref 0.7–4.0)
MCH: 28.9 pg (ref 26.0–34.0)
MCHC: 32.5 g/dL (ref 30.0–36.0)
MCV: 88.7 fL (ref 80.0–100.0)
Monocytes Absolute: 0.8 10*3/uL (ref 0.1–1.0)
Monocytes Relative: 4 %
Neutro Abs: 14.8 10*3/uL — ABNORMAL HIGH (ref 1.7–7.7)
Neutrophils Relative %: 83 %
Platelets: 476 10*3/uL — ABNORMAL HIGH (ref 150–400)
RBC: 4.61 MIL/uL (ref 3.87–5.11)
RDW: 13 % (ref 11.5–15.5)
WBC: 17.8 10*3/uL — ABNORMAL HIGH (ref 4.0–10.5)
nRBC: 0 % (ref 0.0–0.2)

## 2021-03-27 LAB — URINALYSIS, ROUTINE W REFLEX MICROSCOPIC
Bilirubin Urine: NEGATIVE
Glucose, UA: 50 mg/dL — AB
Ketones, ur: NEGATIVE mg/dL
Nitrite: NEGATIVE
Protein, ur: 100 mg/dL — AB
Specific Gravity, Urine: 1.008 (ref 1.005–1.030)
pH: 9 — ABNORMAL HIGH (ref 5.0–8.0)

## 2021-03-27 LAB — COMPREHENSIVE METABOLIC PANEL
ALT: 8 U/L (ref 0–44)
AST: 14 U/L — ABNORMAL LOW (ref 15–41)
Albumin: 2.9 g/dL — ABNORMAL LOW (ref 3.5–5.0)
Alkaline Phosphatase: 67 U/L (ref 38–126)
Anion gap: 11 (ref 5–15)
BUN: 8 mg/dL (ref 8–23)
CO2: 27 mmol/L (ref 22–32)
Calcium: 8.6 mg/dL — ABNORMAL LOW (ref 8.9–10.3)
Chloride: 99 mmol/L (ref 98–111)
Creatinine, Ser: 0.59 mg/dL (ref 0.44–1.00)
GFR, Estimated: >60 mL/min (ref >60)
Glucose, Bld: 206 mg/dL — ABNORMAL HIGH (ref 70–99)
Potassium: 4.1 mmol/L (ref 3.5–5.1)
Sodium: 137 mmol/L (ref 135–145)
Total Bilirubin: 0.7 mg/dL (ref 0.3–1.2)
Total Protein: 7.5 g/dL (ref 6.5–8.1)

## 2021-03-27 LAB — LIPASE, BLOOD: Lipase: 21 U/L (ref 11–51)

## 2021-03-27 MED ORDER — SORBITOL 70 % SOLN
960.0000 mL | TOPICAL_OIL | Freq: Once | ORAL | Status: AC
  Administered 2021-03-27: 960 mL via RECTAL
  Filled 2021-03-27: qty 473

## 2021-03-27 MED ORDER — CEPHALEXIN 500 MG PO CAPS: 500.0000 mg | ORAL_CAPSULE | Freq: Three times a day (TID) | ORAL | 0 refills | Status: AC

## 2021-03-27 MED ORDER — LORAZEPAM 2 MG/ML IJ SOLN
0.5000 mg | Freq: Once | INTRAMUSCULAR | Status: AC
  Administered 2021-03-27: 0.5 mg via INTRAVENOUS
  Filled 2021-03-27: qty 1

## 2021-03-27 MED ORDER — LACTULOSE 10 GM/15ML PO SOLN
30.0000 g | Freq: Once | ORAL | Status: AC
  Administered 2021-03-27: 30 g via ORAL
  Filled 2021-03-27: qty 60

## 2021-03-27 MED ORDER — SODIUM CHLORIDE 0.9 % IV BOLUS
1000.0000 mL | Freq: Once | INTRAVENOUS | Status: AC
  Administered 2021-03-27: 1000 mL via INTRAVENOUS

## 2021-03-27 MED ORDER — HYDROMORPHONE HCL 1 MG/ML IJ SOLN
1.0000 mg | Freq: Once | INTRAMUSCULAR | Status: AC
  Administered 2021-03-27: 1 mg via INTRAVENOUS
  Filled 2021-03-27: qty 1

## 2021-03-27 MED ORDER — SODIUM CHLORIDE 0.9 % IV SOLN
1.0000 g | Freq: Once | INTRAVENOUS | Status: AC
  Administered 2021-03-27: 1 g via INTRAVENOUS
  Filled 2021-03-27: qty 10

## 2021-03-27 NOTE — ED Triage Notes (Signed)
BIBA Per EMS: Pt coming from home with complaints of abd pain and constipation x7 days. Pt newly started on oxycodone which has caused the constipation. Pt has tried miralax with no relief L BKA  212 CBG  114/76  78 HR  98% RA

## 2021-03-27 NOTE — ED Provider Notes (Signed)
[facility]-EMERGENCY DEPT Provider Note   CSN: [id number] Arrival date & time: 03/27/21  1244     History Chief Complaint  Patient presents with  . Constipation  . Abdominal Pain    Yesenia Payne is a 70 y.o. female hx of DM, diverticulitis, HL, here presenting with constipation and nausea.  Patient states that she is on chronic pain meds after her left BKA last year. Patient states that she has a pain management doctor and she is on oxycodone and xtampsa.  Patient states that she has not been having a bowel movement for the last week or so.  She tried MiraLAX and Dulcolax and enemas.  She states that she is nauseated today.  She states that she has diffuse abdominal cramps.  Denies any fevers or vomiting.   The history is provided by the patient.       Past Medical History:  Diagnosis Date  . Acid reflux   . Asthma   . Chronic headache   . Diabetes mellitus without complication (HCC)   . Diverticular disease   . Diverticulitis   . Dyslipidemia   . Fibromyalgia   . Hyperlipidemia   . Hypertension   . IBS (irritable bowel syndrome)   . Insomnia   . Irregular heartbeat   . Low back pain   . Morbid obesity (HCC)   . Neuropathy    distal peripheral  . Osteoarthritis   . PAC (premature atrial contraction) 01/18/2017  . Urinary incontinence   . Vitamin D deficiency     Patient Active Problem List   Diagnosis Date Noted  . Pseudomonas urinary tract infection 12/12/2020  . S/P BKA (below knee amputation) unilateral, left (HCC) 12/12/2020  . Leukocytosis 12/12/2020  . Lower urinary tract infectious disease 12/12/2020  . Morbidly obese (HCC) 10/09/2020  . Sepsis (HCC) 10/05/2020  . Pressure injury of skin 10/05/2020  . Cutaneous abscess of left foot   . Anemia   . Necrotizing cellulitis 10/04/2020  . DOE (dyspnea on exertion) 01/18/2017  . Benign essential HTN 01/18/2017  . PAC (premature atrial contraction) 01/18/2017    Past Surgical  History:  Procedure Laterality Date  . AMPUTATION Left 10/06/2020   Procedure: AMPUTATION BELOW KNEE;  Surgeon: [name], MD;  Location: [facility] OR;  Service: Orthopedics;  Laterality: Left;  . arthoscopics    . BACK SURGERY     L4 & L5  . CESAREAN SECTION    . CESAREAN SECTION    . colonscopy  2009  . Left Knee Surgery  1993  . Right knee surgery  1990  . TOTAL KNEE ARTHROPLASTY       OB History   No obstetric history on file.     Family History  Problem Relation Age of Onset  . Hypertension Other   . Hypertension Mother   . Hyperlipidemia Mother   . Heart attack Father   . Diabetes Father   . CAD Father   . Neuropathy Father   . Diabetes Paternal Grandfather   . Cancer - Other Sister        uterine cancer  . Mental retardation Brother   . Leukemia Sister   . Heart attack Sister   . Other Daughter        ruptured colon x2    Social History   Tobacco Use  . Smoking status: Never Smoker  . Smokeless tobacco: Never Used  Substance Use Topics  . Alcohol use: No  Alcohol/week: 0.0 standard drinks  . Drug use: No    Home Medications Prior to Admission medications   Medication Sig Start Date End Date Taking? Authorizing Provider  albuterol (PROVENTIL) (2.5 MG/3ML) 0.083% nebulizer solution Take 2.5 mg by nebulization 3 (three) times daily as needed. For shortness of breath.    [provider]  Cholecalciferol (DIALYVITE VITAMIN D 5000 PO) Take 10,000 Units by mouth daily.    [provider]  collagenase (SANTYL) ointment Apply topically daily. 10/15/20   Rhetta Mura, MD  desloratadine (CLARINEX) 5 MG tablet Take 5 mg by mouth daily as needed (allergies).    [provider]  docusate sodium (COLACE) 100 MG capsule Take 100 mg by mouth 2 (two) times daily.    [provider]  dorzolamide (TRUSOPT) 2 % ophthalmic solution Place 1 drop into both eyes in the morning and at bedtime.    [provider]  fluticasone  (FLONASE) 50 MCG/ACT nasal spray Place 1 spray into both nostrils 2 (two) times daily as needed for allergies.    [provider]  Fluticasone-Salmeterol (ADVAIR) 250-50 MCG/DOSE AEPB Inhale 1 puff into the lungs daily.    [provider]  furosemide (LASIX) 20 MG tablet Take 40 mg by mouth 2 (two) times daily.    [provider]  gabapentin (NEURONTIN) 300 MG capsule Take 1 capsule (300 mg total) by mouth 3 (three) times daily. 10/14/20   Rhetta Mura, MD  insulin glargine (LANTUS) 100 UNIT/ML injection Inject 0.3 mLs (30 Units total) into the skin daily. Patient taking differently: Inject 30 Units into the skin at bedtime. 10/14/20   Rhetta Mura, MD  levofloxacin (LEVAQUIN) 500 MG tablet Take 1 tablet (500 mg total) by mouth daily. 01/02/21   [name], MD  Magnesium Oxide 420 MG TABS Take 420 mg by mouth daily.    [provider]  methocarbamol (ROBAXIN) 500 MG tablet Take 1 tablet (500 mg total) by mouth every 6 (six) hours as needed for muscle spasms. 01/08/21   Persons, [place], [place]  methocarbamol (ROBAXIN) 500 MG tablet Take 1 tablet (500 mg total) by mouth every 8 (eight) hours as needed for muscle spasms. 03/11/21   Persons, [place], [place]  nystatin (MYCOSTATIN/NYSTOP) powder Apply 1 application topically 3 (three) times daily. 12/10/20   Persons, [place], [place]  omeprazole (PRILOSEC) 20 MG capsule Take 20 mg by mouth 2 (two) times daily before a meal.    [provider]  oxyCODONE ER (XTAMPZA ER) 9 MG C12A Take 9 mg by mouth every 12 (twelve) hours as needed. Patient taking differently: Take 9 mg by mouth in the morning and at bedtime. 10/21/20   Persons, [place], [place]  oxyCODONE-acetaminophen (PERCOCET/ROXICET) 5-325 MG tablet Take 1 tablet by mouth every 4 (four) hours as needed for moderate pain. 10/11/20   [name], MD  potassium chloride (KLOR-CON) 10 MEQ tablet Take 10 mEq by mouth daily. 08/20/15   [provider]   SUMAtriptan (IMITREX) 25 MG tablet Take 25 mg by mouth daily as needed for migraine.    [provider]    Allergies    Iodinated diagnostic agents and Metrizamide  Review of Systems   Review of Systems  Gastrointestinal: Positive for abdominal pain and constipation.  All other systems reviewed and are negative.   Physical Exam Updated Vital Signs BP (!) 163/105 (BP Location: Right Wrist)   Pulse 86   Temp 98.7 F (37.1 C) (Oral)  Resp 16   SpO2 97%   Physical Exam Vitals and nursing note reviewed.  Constitutional:      Comments: Chronically ill, uncomfortable   HENT:     Head: Normocephalic.     Mouth/Throat:     Pharynx: Oropharynx is clear.  Eyes:     Extraocular Movements: Extraocular movements intact.  Cardiovascular:     Rate and Rhythm: Normal rate and regular rhythm.     Heart sounds: Normal heart sounds.  Pulmonary:     Effort: Pulmonary effort is normal.     Breath sounds: Normal breath sounds.  Abdominal:     Comments: Obese and mild left lower quadrant tenderness  Genitourinary:    Comments: Rectal-some soft stool at the vault.  Small hemorrhoid. Skin:    General: Skin is warm.  Neurological:     General: No focal deficit present.     Mental Status: She is oriented to person, place, and time.  Psychiatric:        Mood and Affect: Mood normal.        Behavior: Behavior normal.     ED Results / Procedures / Treatments   Labs (all labs ordered are listed, but only abnormal results are displayed) Labs Reviewed  CBC WITH DIFFERENTIAL/PLATELET  COMPREHENSIVE METABOLIC PANEL  LIPASE, BLOOD  URINALYSIS, ROUTINE W REFLEX MICROSCOPIC    EKG None  Radiology No results found.  Procedures Procedures   Medications Ordered in ED Medications  sodium chloride 0.9 % bolus 1,000 mL (has no administration in time range)  sorbitol, milk of mag, mineral oil, glycerin (SMOG) enema (has no administration in time range)    ED Course  I have  reviewed the triage vital signs and the nursing notes.  Pertinent labs & imaging results that were available during my care of the patient were reviewed by me and considered in my medical decision making (see chart for details).    MDM Rules/Calculators/A&P                         Yesenia Payne is a 70 y.o. female here presenting with constipation.  Patient has a lot of stool in the rectal vault on my exam.  Plan to get KUB to rule out obstruction.  We will try smog enema and lactulose.  5:15 PM  WBC is 18.  UA showed many bacteria.  X-ray showed constipation.  Large bowel movement after given smog enema. Patient also was given antibiotics for UTI.  She has been having ongoing UTI symptoms for several months now.  Her previous urine culture showed multiple species.  Patient was given Rocephin.  Will discharge patient on Keflex.  Final Clinical Impression(s) / ED Diagnoses Final diagnoses:  None    Rx / DC Orders ED Discharge Orders    None       Charlynne Pander, MD 03/27/21 [phone]

## 2021-03-27 NOTE — Discharge Instructions (Signed)
Take Keflex 3 times daily for UTI  You can continue your MiraLAX as prescribed.  Talk to your doctor about good bowel regimen.  Return to ER if you have worse abdominal pain, vomiting, constipation, fever, dehydration

## 2021-03-28 DIAGNOSIS — R5381 Other malaise: Secondary | ICD-10-CM | POA: Diagnosis not present

## 2021-03-28 DIAGNOSIS — R69 Illness, unspecified: Secondary | ICD-10-CM | POA: Diagnosis not present

## 2021-03-28 DIAGNOSIS — Z743 Need for continuous supervision: Secondary | ICD-10-CM | POA: Diagnosis not present

## 2021-03-29 LAB — URINE CULTURE

## 2021-04-02 ENCOUNTER — Telehealth: Payer: Self-pay

## 2021-04-02 NOTE — Telephone Encounter (Signed)
Pt called and would like a refill on her robaxin sent to the [facility]

## 2021-04-03 ENCOUNTER — Telehealth: Payer: Self-pay

## 2021-04-03 ENCOUNTER — Other Ambulatory Visit: Payer: Self-pay | Admitting: Physician Assistant

## 2021-04-03 MED ORDER — METHOCARBAMOL 500 MG PO TABS: 500.0000 mg | ORAL_TABLET | Freq: Four times a day (QID) | ORAL | 0 refills | Status: AC | PRN

## 2021-04-03 NOTE — Telephone Encounter (Signed)
Patients daughter Efraim Kaufmann called she is requesting rx refill for methocarbamol call back:[phone]

## 2021-04-03 NOTE — Telephone Encounter (Signed)
done

## 2021-04-06 DIAGNOSIS — L8990 Pressure ulcer of unspecified site, unspecified stage: Secondary | ICD-10-CM | POA: Diagnosis not present

## 2021-04-06 DIAGNOSIS — L02612 Cutaneous abscess of left foot: Secondary | ICD-10-CM | POA: Diagnosis not present

## 2021-04-07 ENCOUNTER — Other Ambulatory Visit: Payer: Self-pay | Admitting: Orthopedic Surgery

## 2021-04-07 MED ORDER — METHOCARBAMOL 500 MG PO TABS: 500.0000 mg | ORAL_TABLET | Freq: Three times a day (TID) | ORAL | 0 refills | Status: AC | PRN

## 2021-04-11 DIAGNOSIS — L8990 Pressure ulcer of unspecified site, unspecified stage: Secondary | ICD-10-CM | POA: Diagnosis not present

## 2021-04-11 DIAGNOSIS — L02612 Cutaneous abscess of left foot: Secondary | ICD-10-CM | POA: Diagnosis not present

## 2021-04-14 DIAGNOSIS — L8990 Pressure ulcer of unspecified site, unspecified stage: Secondary | ICD-10-CM | POA: Diagnosis not present

## 2021-04-14 DIAGNOSIS — K219 Gastro-esophageal reflux disease without esophagitis: Secondary | ICD-10-CM | POA: Diagnosis not present

## 2021-04-25 DIAGNOSIS — T148XXA Other injury of unspecified body region, initial encounter: Secondary | ICD-10-CM | POA: Diagnosis not present

## 2021-04-25 DIAGNOSIS — I10 Essential (primary) hypertension: Secondary | ICD-10-CM | POA: Diagnosis not present

## 2021-04-25 DIAGNOSIS — E1165 Type 2 diabetes mellitus with hyperglycemia: Secondary | ICD-10-CM | POA: Diagnosis not present

## 2021-04-25 DIAGNOSIS — G894 Chronic pain syndrome: Secondary | ICD-10-CM | POA: Diagnosis not present

## 2021-04-30 ENCOUNTER — Other Ambulatory Visit: Payer: Self-pay

## 2021-04-30 ENCOUNTER — Encounter (HOSPITAL_BASED_OUTPATIENT_CLINIC_OR_DEPARTMENT_OTHER): Payer: Medicare Other | Attending: Physician Assistant | Admitting: Internal Medicine

## 2021-04-30 DIAGNOSIS — Z89512 Acquired absence of left leg below knee: Secondary | ICD-10-CM | POA: Diagnosis not present

## 2021-04-30 DIAGNOSIS — I10 Essential (primary) hypertension: Secondary | ICD-10-CM | POA: Diagnosis not present

## 2021-04-30 DIAGNOSIS — Z743 Need for continuous supervision: Secondary | ICD-10-CM | POA: Diagnosis not present

## 2021-04-30 DIAGNOSIS — E11622 Type 2 diabetes mellitus with other skin ulcer: Secondary | ICD-10-CM | POA: Diagnosis not present

## 2021-04-30 DIAGNOSIS — L97829 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ity: Secondary | ICD-10-CM | POA: Diagnosis not present

## 2021-04-30 DIAGNOSIS — Z7401 Bed confinement status: Secondary | ICD-10-CM | POA: Diagnosis not present

## 2021-04-30 DIAGNOSIS — L97819 Non-pressure chronic ulcer of other part of right lower leg with unspecified severity: Secondary | ICD-10-CM

## 2021-04-30 DIAGNOSIS — E119 Type 2 diabetes mellitus without complications: Secondary | ICD-10-CM | POA: Diagnosis not present

## 2021-04-30 DIAGNOSIS — Z794 Long term (current) use of insulin: Secondary | ICD-10-CM | POA: Diagnosis not present

## 2021-04-30 DIAGNOSIS — E1142 Type 2 diabetes mellitus with diabetic polyneuropathy: Secondary | ICD-10-CM | POA: Diagnosis not present

## 2021-04-30 DIAGNOSIS — R69 Illness, unspecified: Secondary | ICD-10-CM | POA: Diagnosis not present

## 2021-05-01 DIAGNOSIS — S71102A Unspecified open wound, left thigh, initial encounter: Secondary | ICD-10-CM | POA: Diagnosis not present

## 2021-05-01 DIAGNOSIS — S71101A Unspecified open wound, right thigh, initial encounter: Secondary | ICD-10-CM | POA: Diagnosis not present

## 2021-05-01 DIAGNOSIS — E119 Type 2 diabetes mellitus without complications: Secondary | ICD-10-CM | POA: Diagnosis not present

## 2021-05-07 DIAGNOSIS — L8990 Pressure ulcer of unspecified site, unspecified stage: Secondary | ICD-10-CM | POA: Diagnosis not present

## 2021-05-07 DIAGNOSIS — L02612 Cutaneous abscess of left foot: Secondary | ICD-10-CM | POA: Diagnosis not present

## 2021-05-11 DIAGNOSIS — L8990 Pressure ulcer of unspecified site, unspecified stage: Secondary | ICD-10-CM | POA: Diagnosis not present

## 2021-05-11 DIAGNOSIS — L02612 Cutaneous abscess of left foot: Secondary | ICD-10-CM | POA: Diagnosis not present

## 2021-05-12 ENCOUNTER — Other Ambulatory Visit: Payer: Self-pay

## 2021-05-12 ENCOUNTER — Encounter (HOSPITAL_BASED_OUTPATIENT_CLINIC_OR_DEPARTMENT_OTHER): Payer: Medicare Other | Attending: Internal Medicine | Admitting: Internal Medicine

## 2021-05-12 DIAGNOSIS — E1142 Type 2 diabetes mellitus with diabetic polyneuropathy: Secondary | ICD-10-CM | POA: Diagnosis not present

## 2021-05-12 DIAGNOSIS — R531 Weakness: Secondary | ICD-10-CM | POA: Diagnosis not present

## 2021-05-12 DIAGNOSIS — L97829 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ity: Secondary | ICD-10-CM | POA: Diagnosis not present

## 2021-05-12 DIAGNOSIS — L97819 Non-pressure chronic ulcer of other part of right lower leg with unspecified severity: Secondary | ICD-10-CM | POA: Insufficient documentation

## 2021-05-12 DIAGNOSIS — E11622 Type 2 diabetes mellitus with other skin ulcer: Secondary | ICD-10-CM | POA: Diagnosis not present

## 2021-05-12 DIAGNOSIS — Z89512 Acquired absence of left leg below knee: Secondary | ICD-10-CM | POA: Diagnosis not present

## 2021-05-12 DIAGNOSIS — Z743 Need for continuous supervision: Secondary | ICD-10-CM | POA: Diagnosis not present

## 2021-05-12 DIAGNOSIS — Z833 Family history of diabetes mellitus: Secondary | ICD-10-CM | POA: Diagnosis not present

## 2021-05-12 NOTE — Progress Notes (Addendum)
Yesenia Payne, Yesenia Payne ([id number]) Visit Report for 05/12/2021 Arrival Information Details Patient Name: Date of Service: Yesenia Payne, [name] 05/12/2021 3:15 PM Medical Record Number: [id number] Patient Account Number: [id number] Date of Birth/Sex: Treating RN: 1951-08-10 (70 y.o. Yesenia Payne Primary Care Ellianah Cordy: Yesenia Payne Other Clinician: Referring Braeton Wolgamott: Treating Dashana Guizar/Extender: Alysia Penna in Treatment: 1 Visit Information History Since Last Visit Added or deleted any medications: No Patient Arrived: Stretcher Any new allergies or adverse reactions: No Arrival Time: 16:11 Had a fall or experienced change in No Accompanied By: daughter activities of daily living that may affect Transfer Assistance: Stretcher risk of falls: Patient Identification Verified: Yes Signs or symptoms of abuse/neglect since last visito No Secondary Verification Process Completed: Yes Hospitalized since last visit: No Patient Requires Transmission-Based Precautions: No Implantable device outside of the clinic excluding No Patient Has Alerts: No cellular tissue based products placed in the center since last visit: Has Dressing in Place as Prescribed: Yes Pain Present Now: Yes Electronic Signature(s) Signed: 05/12/2021 7:10:16 PM By: Baruch Gouty RN, BSN Entered By: Baruch Gouty on 05/12/2021 16:12:19 -------------------------------------------------------------------------------- Lower Extremity Assessment Details Patient Name: Date of Service: Yesenia Payne, [name] 05/12/2021 3:15 PM Medical Record Number: [id number] Patient Account Number: [id number] Date of Birth/Sex: Treating RN: 26-Sep-1951 (70 y.o. Yesenia Payne Primary Care Yesenia Payne: Yesenia Payne Other Clinician: Referring Aleicia Kenagy: Treating Deante Blough/Extender: Alysia Penna in Treatment: 1 Electronic Signature(s) Signed: 05/12/2021 7:10:16 PM By: Baruch Gouty  RN, BSN Entered By: Baruch Gouty on 05/12/2021 16:26:04 -------------------------------------------------------------------------------- Multi Wound Chart Details Patient Name: Date of Service: Yesenia [name], [name] 05/12/2021 3:15 PM Medical Record Number: [id number] Patient Account Number: [id number] Date of Birth/Sex: Treating RN: 12/09/1950 (70 y.o. Yesenia Payne, Yesenia Payne Primary Care Nykole Matos: Yesenia Payne Other Clinician: Referring Jasmeen Fritsch: Treating Toma Erichsen/Extender: Alysia Penna in Treatment: 1 Vital Signs Height(in): 80 Pulse(bpm): 66 Weight(lbs): 63 Blood Pressure(mmHg): 141/100 Body Mass Index(BMI): 49 Temperature(F): 99.1 Respiratory Rate(breaths/min): 18 Photos: [6:No Photos Right Upper Leg] [7:No Photos Left, Medial Upper Leg] [8:No Photos Left, Lateral Upper Leg] Wound Location: [6:Gradually Appeared] [7:Gradually Appeared] [8:Gradually Appeared] Wounding Event: [6:T be determined o] [7:T be determined] [8:o] Primary Etiology: [6:Cataracts, Glaucoma, Chronic sinus] [7:Cataracts, Glaucoma, Chronic sinus] [8:Cataracts, Glaucoma, Chronic sinus] Comorbid History: [6:problems/congestion, Asthma, Hypertension, Type II Diabetes, Osteoarthritis, Neuropathy 11/06/2020] [7:problems/congestion, Asthma, Hypertension, Type II Diabetes, Osteoarthritis, Neuropathy 01/06/2021] [8:problems/congestion, Asthma,  Hypertension, Type II Diabetes, Osteoarthritis, Neuropathy 01/06/2021] Date Acquired: [6:1] [7:1] [8:1] Weeks of Treatment: [6:Open] [7:Open] [8:Open] Wound Status: [6:No] [7:Yes] [8:No] Clustered Wound: [6:N/A] [7:2] [8:N/A] Clustered Quantity: [6:1.8x2.3x0.5] [7:2.5x1x0.1] [8:15x10.5x2.8] Measurements L x W x D (cm) [6:3.252] [7:1.963] [8:123.7] A (cm) : rea [6:1.626] [7:0.196] [8:346.361] Volume (cm) : [6:88.50%] [7:95.40%] [8:-9.40%] % Reduction in Area: [6:94.20%] [7:97.70%] [8:-22.50%] % Reduction in Volume: [6:Full Thickness Without Exposed]  [7:Full Thickness Without Exposed] [8:Full Thickness With Exposed Support] Classification: [6:Support Structures Medium] [7:Support Structures Medium] [8:Structures Large] Exudate A mount: [6:Purulent] [7:Serosanguineous] [8:Serosanguineous] Exudate Type: [6:yellow, brown, green] [7:red, brown] [8:red, brown] Exudate Color: [6:No] [7:No] [8:Yes] Foul Odor A Cleansing: [6:fter N/A] [7:N/A] [8:No] Odor Anticipated Due to Product Use: [6:Flat and Intact] [7:Flat and Intact] [8:Thickened] Wound Margin: [6:Large (67-100%)] [7:Large (67-100%)] [8:None Present (0%)] Granulation A mount: [6:Red, Pink] [7:Red, Pink] [8:N/A] Granulation Quality: [6:Small (1-33%)] [7:None Present (0%)] [8:Large (67-100%)] Necrotic Amount: [6:Adherent Slough] [7:N/A] [8:Eschar, Adherent Slough] Necrotic Tissue: [6:Fat Layer (Subcutaneous Tissue): Yes Fat Layer (Subcutaneous Tissue): Yes Fat Layer (  Subcutaneous Tissue): Yes] Exposed Structures: [6:Fascia: No Tendon: No Muscle: No Joint: No Bone: No None] [7:Fascia: No Tendon: No Muscle: No Joint: No Bone: No Medium (34-66%)] [8:Muscle: Yes Fascia: No Tendon: No Joint: No Bone: No None] Epithelialization: [6:N/A] [7:Chemical/Enzymatic/Mechanical] [8:Debridement - Selective/Open Wound] Debridement: Pre-procedure Verification/Time Out N/A [7:N/A] [8:16:30] Taken: [6:N/A] [7:N/A] [8:Lidocaine 4% Topical Solution] Pain Control: [6:N/A] [7:N/A] [8:Necrotic/Eschar, Slough] Tissue Debrided: [6:N/A] [7:N/A] [8:Non-Viable Tissue] Level: [6:N/A] [7:N/A] [8:157.5] Debridement A (sq cm): [6:rea N/A] [7:N/A] [8:Blade, Forceps] Instrument: [6:N/A] [7:Minimum] [8:Minimum] Bleeding: [6:N/A] [7:Pressure] [8:Pressure] Hemostasis A chieved: [6:N/A] [7:0] [8:0] Procedural Pain: [6:N/A] [7:0] [8:0] Post Procedural Pain: [6:N/A] [7:Procedure was tolerated well] [8:Procedure was tolerated well] Debridement Treatment Response: [6:N/A] [7:2.5x1x0.1] [8:15x10.5x2.8] Post Debridement  Measurements L x W x D (cm) [6:N/A] [7:0.196] [8:250.539] Post Debridement Volume: (cm) [6:N/A] [7:Debridement] [8:Debridement] Treatment Notes Electronic Signature(s) Signed: 05/12/2021 5:03:41 PM By: Kalman Shan DO Signed: 05/12/2021 6:55:28 PM By: Deon Pilling Entered By: Kalman Shan on 05/12/2021 16:56:14 -------------------------------------------------------------------------------- Multi-Disciplinary Care Plan Details Patient Name: Date of Service: Yesenia [name], [name] 05/12/2021 3:15 PM Medical Record Number: [id number] Patient Account Number: [id number] Date of Birth/Sex: Treating RN: [birth year] (70 y.o. Yesenia Payne, Tammi Klippel Primary Care Joane Postel: Yesenia Payne Other Clinician: Referring Cathren Sween: Treating Jalaya Sarver/Extender: Alysia Penna in Treatment: 1 Active Inactive Wound/Skin Impairment Nursing Diagnoses: Impaired tissue integrity Knowledge deficit related to ulceration/compromised skin integrity Goals: Patient will demonstrate a reduced rate of smoking or cessation of smoking Date Initiated: 04/30/2021 Target Resolution Date: 06/05/2021 Goal Status: Active Patient will have a decrease in wound volume by X% from date: (specify in notes) Date Initiated: 04/30/2021 Target Resolution Date: 06/04/2021 Goal Status: Active Patient/caregiver will verbalize understanding of skin care regimen Date Initiated: 04/30/2021 Target Resolution Date: 06/05/2021 Goal Status: Active Interventions: Assess patient/caregiver ability to obtain necessary supplies Assess patient/caregiver ability to perform ulcer/skin care regimen upon admission and as needed Assess ulceration(s) every visit Notes: Electronic Signature(s) Signed: 05/12/2021 6:55:28 PM By: Deon Pilling Entered By: Deon Pilling on 05/12/2021 16:46:54 -------------------------------------------------------------------------------- Pain Assessment Details Patient Name: Date of Service: Yesenia  [name], [name] 05/12/2021 3:15 PM Medical Record Number: [id number] Patient Account Number: [id number] Date of Birth/Sex: Treating RN: 1951-05-25 (70 y.o. Yesenia Payne Primary Care Calton Harshfield: Yesenia Payne Other Clinician: Referring Himani Corona: Treating Bryker Fletchall/Extender: Alysia Penna in Treatment: 1 Active Problems Location of Pain Severity and Description of Pain Patient Has Paino Yes Site Locations Pain Location: Pain Location: Pain in Ulcers With Dressing Change: Yes Duration of the Pain. Constant / Intermittento Intermittent Rate the pain. Current Pain Level: 2 Worst Pain Level: 5 Least Pain Level: 0 Character of Pain Describe the Pain: Aching, Tender Pain Management and Medication Current Pain Management: Medication: Yes Is the Current Pain Management Adequate: Adequate How does your wound impact your activities of daily livingo Sleep: No Bathing: No Appetite: No Relationship With Others: No Bladder Continence: No Emotions: No Bowel Continence: No Work: No Toileting: No Drive: No Dressing: No Hobbies: No Electronic Signature(s) Signed: 05/12/2021 7:10:16 PM By: Baruch Gouty RN, BSN Entered By: Baruch Gouty on 05/12/2021 16:25:48 -------------------------------------------------------------------------------- Patient/Caregiver Education Details Patient Name: Date of Service: Yesenia [name], [name] 7/5/2022andnbsp3:15 PM Medical Record Number: [id number] Patient Account Number: [id number] Date of Birth/Gender: Treating RN: 1951/10/02 (70 y.o. [name] Primary Care Physician: Yesenia Payne Other Clinician: Referring Physician: Treating Physician/Extender: Alysia Penna in Treatment: 1 Education Assessment Education Provided To: Patient  Education Topics Provided Wound/Skin Impairment: Handouts: Skin Care Do's and Dont's Methods: Explain/Verbal Responses: Reinforcements  needed Electronic Signature(s) Signed: 05/12/2021 6:55:28 PM By: Deon Pilling Entered By: Deon Pilling on 05/12/2021 16:47:14 -------------------------------------------------------------------------------- Wound Assessment Details Patient Name: Date of Service: Yesenia [name], [name] 05/12/2021 3:15 PM Medical Record Number: [id number] Patient Account Number: [id number] Date of Birth/Sex: Treating RN: 11/03/1951 (70 y.o. Yesenia Payne Primary Care Tacha Manni: Yesenia Payne Other Clinician: Referring Daksh Coates: Treating Shritha Bresee/Extender: Alysia Penna in Treatment: 1 Wound Status Wound Number: 6 Primary T be determined o Etiology: Wound Location: Right Upper Leg Wound Open Wounding Event: Gradually Appeared Status: Date Acquired: 11/06/2020 Comorbid Cataracts, Glaucoma, Chronic sinus problems/congestion, Asthma, Weeks Of Treatment: 1 History: Hypertension, Type II Diabetes, Osteoarthritis, Neuropathy Clustered Wound: No Photos Wound Measurements Length: (cm) 1.8 Width: (cm) 2.3 Depth: (cm) 0.5 Area: (cm) 3.252 Volume: (cm) 1.626 % Reduction in Area: 88.5% % Reduction in Volume: 94.2% Epithelialization: None Tunneling: No Undermining: No Wound Description Classification: Full Thickness Without Exposed Support Structures Wound Margin: Flat and Intact Exudate Amount: Medium Exudate Type: Purulent Exudate Color: yellow, brown, green Foul Odor After Cleansing: No Slough/Fibrino No Wound Bed Granulation Amount: Large (67-100%) Exposed Structure Granulation Quality: Red, Pink Fascia Exposed: No Necrotic Amount: Small (1-33%) Fat Layer (Subcutaneous Tissue) Exposed: Yes Necrotic Quality: Adherent Slough Tendon Exposed: No Muscle Exposed: No Joint Exposed: No Bone Exposed: No Electronic Signature(s) Signed: 05/13/2021 5:22:20 PM By: Sandre Kitty Signed: 05/13/2021 6:17:15 PM By: Baruch Gouty RN, BSN Previous Signature: 05/12/2021  7:10:16 PM Version By: Baruch Gouty RN, BSN Entered By: Sandre Kitty on 05/13/2021 14:50:53 -------------------------------------------------------------------------------- Wound Assessment Details Patient Name: Date of Service: Yesenia [name], [name] 05/12/2021 3:15 PM Medical Record Number: [id number] Patient Account Number: [id number] Date of Birth/Sex: Treating RN: 04/17/1951 (70 y.o. Yesenia Payne Primary Care Gaynelle Pastrana: Yesenia Payne Other Clinician: Referring Aydee Mcnew: Treating Benny Deutschman/Extender: Alysia Penna in Treatment: 1 Wound Status Wound Number: 7 Primary T be determined o Etiology: Wound Location: Left, Medial Upper Leg Wound Open Wounding Event: Gradually Appeared Status: Date Acquired: 01/06/2021 Comorbid Cataracts, Glaucoma, Chronic sinus problems/congestion, Asthma, Weeks Of Treatment: 1 History: Hypertension, Type II Diabetes, Osteoarthritis, Neuropathy Clustered Wound: Yes Photos Wound Measurements Length: (cm) 2.5 Width: (cm) 1 Depth: (cm) 0.1 Clustered Quantity: 2 Area: (cm) 1.9 Volume: (cm) 0.1 % Reduction in Area: 95.4% % Reduction in Volume: 97.7% Epithelialization: Medium (34-66%) Tunneling: No 63 Undermining: No 96 Wound Description Classification: Full Thickness Without Exposed Support Stru Wound Margin: Flat and Intact Exudate Amount: Medium Exudate Type: Serosanguineous Exudate Color: red, brown ctures Foul Odor After Cleansing: No Slough/Fibrino Yes Wound Bed Granulation Amount: Large (67-100%) Exposed Structure Granulation Quality: Red, Pink Fascia Exposed: No Necrotic Amount: None Present (0%) Fat Layer (Subcutaneous Tissue) Exposed: Yes Tendon Exposed: No Muscle Exposed: No Joint Exposed: No Bone Exposed: No Electronic Signature(s) Signed: 05/13/2021 5:22:20 PM By: Sandre Kitty Signed: 05/13/2021 6:17:15 PM By: Baruch Gouty RN, BSN Previous Signature: 05/12/2021 7:10:16 PM Version By:  Baruch Gouty RN, BSN Entered By: Sandre Kitty on 05/13/2021 14:49:34 -------------------------------------------------------------------------------- Wound Assessment Details Patient Name: Date of Service: Yesenia [name], [name] 05/12/2021 3:15 PM Medical Record Number: [id number] Patient Account Number: [id number] Date of Birth/Sex: Treating RN: May 22, 1951 (70 y.o. Yesenia Payne Primary Care Yurika Pereda: Other Clinician: Clovia Payne Referring Woodruff Skirvin: Treating Jozalynn Noyce/Extender: Alysia Penna in Treatment: 1 Wound Status Wound Number: 8 Primary T be determined o  Etiology: Wound Location: Left, Lateral Upper Leg Wound Open Wounding Event: Gradually Appeared Status: Date Acquired: 01/06/2021 Comorbid Cataracts, Glaucoma, Chronic sinus problems/congestion, Asthma, Weeks Of Treatment: 1 History: Hypertension, Type II Diabetes, Osteoarthritis, Neuropathy Clustered Wound: No Photos Wound Measurements Length: (cm) 15 Width: (cm) 10.5 Depth: (cm) 2.8 Area: (cm) 123.7 Volume: (cm) 346.361 % Reduction in Area: -9.4% % Reduction in Volume: -22.5% Epithelialization: None Tunneling: No Undermining: No Wound Description Classification: Full Thickness With Exposed Support Structures Wound Margin: Thickened Exudate Amount: Large Exudate Type: Serosanguineous Exudate Color: red, brown Foul Odor After Cleansing: Yes Due to Product Use: No Slough/Fibrino Yes Wound Bed Granulation Amount: None Present (0%) Exposed Structure Necrotic Amount: Large (67-100%) Fascia Exposed: No Necrotic Quality: Eschar, Adherent Slough Fat Layer (Subcutaneous Tissue) Exposed: Yes Tendon Exposed: No Muscle Exposed: Yes Necrosis of Muscle: No Joint Exposed: No Bone Exposed: No Electronic Signature(s) Signed: 05/13/2021 5:22:20 PM By: Sandre Kitty Signed: 05/13/2021 6:17:15 PM By: Baruch Gouty RN, BSN Previous Signature: 05/12/2021 7:10:16 PM Version By:  Baruch Gouty RN, BSN Entered By: Sandre Kitty on 05/13/2021 14:48:34 -------------------------------------------------------------------------------- Vitals Details Patient Name: Date of Service: Yesenia [name], [name] 05/12/2021 3:15 PM Medical Record Number: [id number] Patient Account Number: [id number] Date of Birth/Sex: Treating RN: 01-31-1951 (70 y.o. Yesenia Payne Primary Care [place] Korver: Yesenia Payne Other Clinician: Referring Saidi Santacroce: Treating Keilyn Nadal/Extender: Alysia Penna in Treatment: 1 Vital Signs Time Taken: 16:12 Temperature (F): 99.1 Height (in): 63 Pulse (bpm): 79 Source: Stated Respiratory Rate (breaths/min): 18 Weight (lbs): 279 Blood Pressure (mmHg): 141/100 Source: Stated Reference Range: 80 - 120 mg / dl Body Mass Index (BMI): 49.4 Electronic Signature(s) Signed: 05/12/2021 7:10:16 PM By: Baruch Gouty RN, BSN Entered By: Baruch Gouty on 05/12/2021 16:14:19

## 2021-05-13 ENCOUNTER — Encounter (HOSPITAL_BASED_OUTPATIENT_CLINIC_OR_DEPARTMENT_OTHER): Payer: Medicare Other | Admitting: Physician Assistant

## 2021-05-14 DIAGNOSIS — K219 Gastro-esophageal reflux disease without esophagitis: Secondary | ICD-10-CM | POA: Diagnosis not present

## 2021-05-14 DIAGNOSIS — L8990 Pressure ulcer of unspecified site, unspecified stage: Secondary | ICD-10-CM | POA: Diagnosis not present

## 2021-05-15 NOTE — Progress Notes (Signed)
Yesenia Payne ([id number]) Visit Report for 05/12/2021 Chief Complaint Document Details Patient Name: Date of Service: [name], [name] 05/12/2021 3:15 PM Medical Record Number: [id number] Patient Account Number: [id number] Date of Birth/Sex: Treating RN: [birth year] (70 y.o. Yesenia Payne Primary Care Provider: [name] Other Clinician: Referring Provider: Treating Provider/Extender: Alysia Penna in Treatment: 1 Information Obtained from: Patient Chief Complaint Left lower extremity wounds and right upper leg wound Electronic Signature(s) Signed: 05/12/2021 5:03:41 PM By: Kalman Shan DO Entered By: Kalman Shan on 05/12/2021 16:57:27 -------------------------------------------------------------------------------- Debridement Details Patient Name: Date of Service: [name], [name] 05/12/2021 3:15 PM Medical Record Number: [id number] Patient Account Number: [id number] Date of Birth/Sex: Treating RN: 1951/07/26 (70 y.o. Yesenia Payne, Yesenia Payne Primary Care Provider: [name] Other Clinician: Referring Provider: Treating Provider/Extender: Alysia Penna in Treatment: 1 Debridement Performed for Assessment: Wound #8 Left,Lateral Upper Leg Performed By: Physician Kalman Shan, DO Debridement Type: Debridement Level of Consciousness (Pre-procedure): Awake and Alert Pre-procedure Verification/Time Out Yes - 16:30 Taken: Start Time: 16:31 Pain Control: Lidocaine 4% T opical Solution T Area Debrided (L x W): otal 15 (cm) x 10.5 (cm) = 157.5 (cm) Tissue and other material debrided: Viable, Non-Viable, Eschar, Slough, Slough Level: Non-Viable Tissue Debridement Description: Selective/Open Wound Instrument: Blade, Forceps Bleeding: Minimum Hemostasis Achieved: Pressure End Time: 16:45 Procedural Pain: 0 Post Procedural Pain: 0 Response to Treatment: Procedure was tolerated well Level of Consciousness  (Post- Awake and Alert procedure): Post Debridement Measurements of Total Wound Length: (cm) 15 Width: (cm) 10.5 Depth: (cm) 2.8 Volume: (cm) 346.361 Character of Wound/Ulcer Post Debridement: Requires Further Debridement Post Procedure Diagnosis Same as Pre-procedure Electronic Signature(s) Signed: 05/12/2021 5:03:41 PM By: Kalman Shan DO Signed: 05/12/2021 6:55:28 PM By: Deon Pilling Entered By: Deon Pilling on 05/12/2021 16:49:40 -------------------------------------------------------------------------------- Debridement Details Patient Name: Date of Service: [name], [name] 05/12/2021 3:15 PM Medical Record Number: [id number] Patient Account Number: [id number] Date of Birth/Sex: Treating RN: 1950-11-18 (70 y.o. Yesenia Payne, Yesenia Payne Primary Care Provider: [name] Other Clinician: Referring Provider: Treating Provider/Extender: Alysia Penna in Treatment: 1 Debridement Performed for Assessment: Wound #7 Left,Medial Upper Leg Performed By: Clinician Deon Pilling, RN Debridement Type: Chemical/Enzymatic/Mechanical Agent Used: Santyl Level of Consciousness (Pre-procedure): Awake and Alert Pre-procedure Verification/Time Out No Taken: Bleeding: Minimum Hemostasis Achieved: Pressure End Time: 16:45 Procedural Pain: 0 Post Procedural Pain: 0 Response to Treatment: Procedure was tolerated well Level of Consciousness (Post- Awake and Alert procedure): Post Debridement Measurements of Total Wound Length: (cm) 2.5 Width: (cm) 1 Depth: (cm) 0.1 Volume: (cm) 0.196 Character of Wound/Ulcer Post Debridement: Requires Further Debridement Post Procedure Diagnosis Same as Pre-procedure Electronic Signature(s) Signed: 05/12/2021 5:03:41 PM By: Kalman Shan DO Signed: 05/12/2021 6:55:28 PM By: Deon Pilling Entered By: Deon Pilling on 05/12/2021 16:50:56 -------------------------------------------------------------------------------- HPI  Details Patient Name: Date of Service: [name], [name] 05/12/2021 3:15 PM Medical Record Number: [id number] Patient Account Number: [id number] Date of Birth/Sex: Treating RN: 10/11/1951 (70 y.o. 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 She has been seen in our clinic multiple times last seen in 03/2019 for right toe wounds.  Today she presents for 3 wounds. 1 is located to the right upper leg and 2 to the left lower extremity. They started at the end of December And not sure how I started. She had not been using any dressings up until 2 weeks ago. She is currently using silver alginate to the right upper leg and Santyl to the wounds on the left leg. She has been on doxycycline and 2 rounds of clindamycin for cellulitis to these wounds. She finished her last round of antibiotics 3 weeks ago. She reports minimal pain to the wound. She currently denies signs of infection including increased erythema, warmth or purulent drainage. 7/5; patient presents for 1 week follow-up. She has been using Santyl to the left lower extremity wounds and silver alginate to the right upper leg wound. She denies signs of infection. She has no complaints today. Electronic Signature(s) Signed: 05/12/2021 5:03:41 PM By: Kalman Shan DO Entered By: Kalman Shan on 05/12/2021 16:58:05 -------------------------------------------------------------------------------- Physical Exam Details Patient Name: Date of Service: [name], [name] 05/12/2021 3:15 PM Medical Record Number: [id number] Patient Account Number: [id number] Date  of Birth/Sex: Treating RN: [birth year] (70 y.o. Yesenia Payne Primary Care Provider: [name] Other Clinician: Referring Provider: Treating Provider/Extender: Alysia Penna in Treatment: 1 Constitutional respirations regular, non-labored and within target range for patient.Marland Kitchen Psychiatric pleasant and cooperative. Notes Right lower extremity: On the upper leg she has a open wound with granulation tissue present. This appears clean and healthy. Left lower extremity: On the medial aspect there are 2 wounds close to each other that is limited to skin breakdown with some nonviable tissue present But appears well-healing. T the o lateral side there is significant eschar and nonviable tissue. Fat is exposed. No obvious signs of infection to any of the wounds Electronic Signature(s) Signed: 05/12/2021 5:03:41 PM By: Kalman Shan DO Entered By: Kalman Shan on 05/12/2021 16:58:56 -------------------------------------------------------------------------------- Physician Orders Details Patient Name: Date of Service: [name], [name] 05/12/2021 3:15 PM Medical Record Number: [id number] Patient Account Number: [id number] Date of Birth/Sex: Treating RN: Oct 24, 1951 (70 y.o. Yesenia Payne, Yesenia Payne Primary Care Provider: [name] Other Clinician: Referring Provider: Treating Provider/Extender: Alysia Penna in Treatment: 1 Verbal / Phone Orders: No Diagnosis Coding ICD-10 Coding Code Description [phone] 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ity L97.819 Non-pressure chronic ulcer of other part of right lower leg with unspecified severity E11.9 Type 2 diabetes mellitus without complications N39.767 Acquired absence of left leg below knee Follow-up Appointments ppointment in 2 weeks. - Dr. Heber [place] Return A Bathing/ Shower/ Hygiene Other Bathing/Shower/Hygiene Orders/Instructions: - You may  sponge bathe Off-Loading Turn and reposition every 2 hours Wound Treatment Wound #6 - Upper Leg Wound Laterality: Right Cleanser: Wound Cleanser (Generic) 1 x Per Day/15 Days Discharge Instructions: Cleanse the wound with wound cleanser prior to applying a clean dressing using gauze sponges, not tissue or cotton balls. Prim Dressing: KerraCel Ag Gelling Fiber Dressing, 4x5 in (silver alginate) (Generic) 1 x Per Day/15 Days ary Discharge Instructions: Apply silver alginate to wound bed as instructed Secondary Dressing: Woven Gauze Sponge, Non-Sterile 4x4 in (Generic) 1 x Per Day/15 Days Discharge Instructions: Apply over primary dressing as directed. Secondary Dressing: ABD Pad, 5x9 (Generic) 1 x Per Day/15 Days Discharge Instructions: Apply over primary dressing as directed. Secured With: 46M Medipore H Soft Cloth Surgical T 4 x 2 (in/yd) (Generic) 1 x Per Day/15 Days  ape Discharge Instructions: Secure dressing with tape as directed. Wound #7 - Upper Leg Wound Laterality: Left, Medial Cleanser: Anasept Antimicrobial Skin and Wound Cleanser, 8 (oz) (Generic) 1 x Per Day/15 Days Discharge Instructions: Cleanse the wound with Anasept cleanser prior to applying a clean dressing using gauze sponges, not tissue or cotton balls. Prim Dressing: Santyl Ointment 1 x Per Day/15 Days ary Discharge Instructions: Apply nickel thick amount to wound bed as instructed Secondary Dressing: Woven Gauze Sponge, Non-Sterile 4x4 in (Generic) 1 x Per Day/15 Days Discharge Instructions: apply gauze over primary dressing as directed. Secondary Dressing: ABD Pad, 5x9 (Generic) 1 x Per Day/15 Days Discharge Instructions: Apply over primary dressing as directed. Secured With: 70M Medipore H Soft Cloth Surgical T 4 x 2 (in/yd) (Generic) 1 x Per Day/15 Days ape Discharge Instructions: Secure dressing with tape as directed. Wound #8 - Upper Leg Wound Laterality: Left, Lateral Cleanser: Anasept Antimicrobial Skin and  Wound Cleanser, 8 (oz) (Generic) 1 x Per Day/15 Days Discharge Instructions: Cleanse the wound with Anasept cleanser prior to applying a clean dressing using gauze sponges, not tissue or cotton balls. Prim Dressing: Santyl Ointment 1 x Per Day/15 Days ary Discharge Instructions: Apply nickel thick amount to wound bed as instructed Secondary Dressing: Woven Gauze Sponge, Non-Sterile 4x4 in (Generic) 1 x Per Day/15 Days Discharge Instructions: Apply Apply saline moisten gauze backing the santyl over primary dressing as directed. Secondary Dressing: ABD Pad, 5x9 (Generic) 1 x Per Day/15 Days Discharge Instructions: Apply over primary dressing as directed. Secondary Dressing: Zetuvit Plus 4x8 in (Generic) 1 x Per Day/15 Days Discharge Instructions: Apply over primary dressing as directed. Secondary Dressing: CarboFLEX Odor Control Dressing, 4x4 in (Generic) 1 x Per Day/15 Days Discharge Instructions: Apply over primary dressing as directed. Secured With: 70M Medipore H Soft Cloth Surgical T 4 x 2 (in/yd) (Generic) 1 x Per Day/15 Days ape Discharge Instructions: Secure dressing with tape as directed. Electronic Signature(s) Signed: 05/12/2021 5:03:41 PM By: Kalman Shan DO Entered By: Kalman Shan on 05/12/2021 16:59:22 -------------------------------------------------------------------------------- Problem List Details Patient Name: Date of Service: [name], [name] 05/12/2021 3:15 PM Medical Record Number: [id number] Patient Account Number: [id number] Date of Birth/Sex: Treating RN: [birth year] (70 y.o. Yesenia Payne, Yesenia Payne Primary Care Provider: [name] Other Clinician: Referring Provider: Treating Provider/Extender: Alysia Penna in Treatment: 1 Active Problems ICD-10 Encounter Code Description Active Date MDM Diagnosis L97.829 Non-pressure chronic ulcer of other part of left lower leg with unspecified 04/30/2021 No Yes severity L97.819  Non-pressure chronic ulcer of other part of right lower leg with unspecified 04/30/2021 No Yes severity E11.9 Type 2 diabetes mellitus without complications 7/49/4496 No Yes Z89.512 Acquired absence of left leg below knee 04/30/2021 No Yes Inactive Problems Resolved Problems Electronic Signature(s) Signed: 05/12/2021 5:03:41 PM By: Kalman Shan DO Entered By: Kalman Shan on 05/12/2021 16:56:05 -------------------------------------------------------------------------------- Progress Note Details Patient Name: Date of Service: [name], [name] 05/12/2021 3:15 PM Medical Record Number: [id number] Patient Account Number: [id number] Date of Birth/Sex: Treating RN: 06-22-1951 (70 y.o. Yesenia Payne Primary Care Provider: [name] Other Clinician: Referring Provider: Treating Provider/Extender: Alysia Penna in Treatment: 1 Subjective Chief Complaint Information obtained from Patient Left lower extremity wounds and right upper leg wound History of Present Illness (HPI) Admission 04/30/2021 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 She  has been seen in our clinic multiple times last seen in 03/2019 for right toe wounds. Today she presents for 3 wounds. 1 is located to the right upper leg and 2 to the left lower extremity. They started at the end of December And not sure how I started. She had not been using any dressings up until 2 weeks ago. She is currently using silver alginate to the right upper leg and Santyl to the wounds on the left leg. She has been on doxycycline and 2 rounds of clindamycin for cellulitis to these wounds. She finished her last round of antibiotics 3 weeks ago. She reports minimal pain to the wound. She currently denies signs of infection including increased erythema, warmth or purulent  drainage. 7/5; patient presents for 1 week follow-up. She has been using Santyl to the left lower extremity wounds and silver alginate to the right upper leg wound. She denies signs of infection. She has no complaints today. Patient History Information obtained from Patient. Family History Cancer - Child, Diabetes - Father, Heart Disease - Mother,Father, Hypertension - Mother,Father, No family history of Hereditary Spherocytosis, Kidney Disease, Lung Disease, Seizures, Stroke, Thyroid Problems, Tuberculosis. Social History Never smoker, Marital Status - Married, Alcohol Use - Never, Drug Use - No History, Caffeine Use - Daily - coffee , sodas. Medical History Eyes Patient has history of Cataracts - lens implant done, Glaucoma Denies history of Optic Neuritis Ear/Nose/Mouth/Throat Patient has history of Chronic sinus problems/congestion - seasonal Denies history of Middle ear problems Hematologic/Lymphatic Denies history of Anemia, Hemophilia, Human Immunodeficiency Virus, Lymphedema, Sickle Cell Disease Respiratory Patient has history of Asthma Denies history of Aspiration, Chronic Obstructive Pulmonary Disease (COPD), Pneumothorax, Sleep Apnea, Tuberculosis Cardiovascular Patient has history of Hypertension - not on meds presently Denies history of Angina, Arrhythmia, Congestive Heart Failure, Coronary Artery Disease, Deep Vein Thrombosis, Hypotension, Myocardial Infarction, Peripheral Arterial Disease, Peripheral Venous Disease, Phlebitis, Vasculitis Gastrointestinal Denies history of Cirrhosis , Colitis, Crohnoos, Hepatitis A, Hepatitis B, Hepatitis C Endocrine Patient has history of Type II Diabetes Denies history of Type I Diabetes Genitourinary Denies history of End Stage Renal Disease Immunological Denies history of Lupus Erythematosus, Raynaudoos, Scleroderma Integumentary (Skin) Denies history of History of Burn Musculoskeletal Patient has history of  Osteoarthritis Denies history of Gout, Rheumatoid Arthritis, Osteomyelitis Neurologic Patient has history of Neuropathy Denies history of Dementia, Quadriplegia, Paraplegia, Seizure Disorder Oncologic Denies history of Received Chemotherapy, Received Radiation Psychiatric Denies history of Anorexia/bulimia, Confinement Anxiety Medical A Surgical History Notes nd Cardiovascular Hyperlipidemia Gastrointestinal GERD, Diverticulitis Genitourinary Overactive Bladder Musculoskeletal Fibromyalgia, toe amputations x 3 Psychiatric Anxiety Objective Constitutional respirations regular, non-labored and within target range for patient.. Vitals Time Taken: 4:12 PM, Height: 63 in, Source: Stated, Weight: 279 lbs, Source: Stated, BMI: 49.4, Temperature: 99.1 F, Pulse: 79 bpm, Respiratory Rate: 18 breaths/min, Blood Pressure: 141/100 mmHg. Psychiatric pleasant and cooperative. General Notes: Right lower extremity: On the upper leg she has a open wound with granulation tissue present. This appears clean and healthy. Left lower extremity: On the medial aspect there are 2 wounds close to each other that is limited to skin breakdown with some nonviable tissue present But appears well- healing. T the lateral side there is significant eschar and nonviable tissue. Fat is exposed. No obvious signs of infection to any of the wounds o Integumentary (Hair, Skin) Wound #6 status is Open. Original cause of wound was Gradually Appeared. The date acquired was: 11/06/2020. The wound has been in treatment 1 weeks. The wound is  located on the Right Upper Leg. The wound measures 1.8cm length x 2.3cm width x 0.5cm depth; 3.252cm^2 area and 1.626cm^3 volume. There is Fat Layer (Subcutaneous Tissue) exposed. There is no tunneling or undermining noted. There is a medium amount of purulent drainage noted. The wound margin is flat and intact. There is large (67-100%) red, pink granulation within the wound bed. There is  a small (1-33%) amount of necrotic tissue within the wound bed including Adherent Slough. Wound #7 status is Open. Original cause of wound was Gradually Appeared. The date acquired was: 01/06/2021. The wound has been in treatment 1 weeks. The wound is located on the Left,Medial Upper Leg. The wound measures 2.5cm length x 1cm width x 0.1cm depth; 1.963cm^2 area and 0.196cm^3 volume. There is Fat Layer (Subcutaneous Tissue) exposed. There is no tunneling or undermining noted. There is a medium amount of serosanguineous drainage noted. The wound margin is flat and intact. There is large (67-100%) red, pink granulation within the wound bed. There is no necrotic tissue within the wound bed. Wound #8 status is Open. Original cause of wound was Gradually Appeared. The date acquired was: 01/06/2021. The wound has been in treatment 1 weeks. The wound is located on the Left,Lateral Upper Leg. The wound measures 15cm length x 10.5cm width x 2.8cm depth; 123.7cm^2 area and 346.361cm^3 volume. There is muscle and Fat Layer (Subcutaneous Tissue) exposed. There is no tunneling or undermining noted. There is a large amount of serosanguineous drainage noted. Foul odor after cleansing was noted. The wound margin is thickened. There is no granulation within the wound bed. There is a large (67-100%) amount of necrotic tissue within the wound bed including Eschar and Adherent Slough. Assessment Active Problems ICD-10 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ity Non-pressure chronic ulcer of other part of right lower leg with unspecified severity Type 2 diabetes mellitus without complications Acquired absence of left leg below knee The right upper leg wound and left medial wound show signs of improvement in size and appearance. I recommended continuing silver alginate to the right and Santyl to the left. The left lateral wound has less eschar present. I was able to remove some of  the necrotic tissue. I recommended continuing Santyl. There is no more depth but this is to be expected as this was completely covered by an eschar. No signs of infection. I will see the patient in 1 week Procedures Wound #7 Pre-procedure diagnosis of Wound #7 is a T be determined located on the Left,Medial Upper Leg . There was a Chemical/Enzymatic/Mechanical debridement o performed by Deon Pilling, RN.Marland Kitchen Agent used was [employer]. A Minimum amount of bleeding was controlled with Pressure. The procedure was tolerated well with a pain level of 0 throughout and a pain level of 0 following the procedure. Post Debridement Measurements: 2.5cm length x 1cm width x 0.1cm depth; 0.196cm^3 volume. Character of Wound/Ulcer Post Debridement requires further debridement. Post procedure Diagnosis Wound #7: Same as Pre-Procedure Wound #8 Pre-procedure diagnosis of Wound #8 is a T be determined located on the Left,Lateral Upper Leg . There was a Selective/Open Wound Non-Viable Tissue o Debridement with a total area of 157.5 sq cm performed by Kalman Shan, DO. With the following instrument(s): Blade, and Forceps to remove Viable and Non-Viable tissue/material. Material removed includes Eschar and Slough and after achieving pain control using Lidocaine 4% Topical Solution. A time out was conducted at 16:30, prior to the start of the procedure. A Minimum amount  of bleeding was controlled with Pressure. The procedure was tolerated well with a pain level of 0 throughout and a pain level of 0 following the procedure. Post Debridement Measurements: 15cm length x 10.5cm width x 2.8cm depth; 346.361cm^3 volume. Character of Wound/Ulcer Post Debridement requires further debridement. Post procedure Diagnosis Wound #8: Same as Pre-Procedure Plan Follow-up Appointments: Return Appointment in 2 weeks. - Dr. Heber [place] Bathing/ Shower/ Hygiene: Other Bathing/Shower/Hygiene Orders/Instructions: - You may sponge  bathe Off-Loading: Turn and reposition every 2 hours WOUND #6: - Upper Leg Wound Laterality: Right Cleanser: Wound Cleanser (Generic) 1 x Per Day/15 Days Discharge Instructions: Cleanse the wound with wound cleanser prior to applying a clean dressing using gauze sponges, not tissue or cotton balls. Prim Dressing: KerraCel Ag Gelling Fiber Dressing, 4x5 in (silver alginate) (Generic) 1 x Per Day/15 Days ary Discharge Instructions: Apply silver alginate to wound bed as instructed Secondary Dressing: Woven Gauze Sponge, Non-Sterile 4x4 in (Generic) 1 x Per Day/15 Days Discharge Instructions: Apply over primary dressing as directed. Secondary Dressing: ABD Pad, 5x9 (Generic) 1 x Per Day/15 Days Discharge Instructions: Apply over primary dressing as directed. Secured With: 37M Medipore H Soft Cloth Surgical T 4 x 2 (in/yd) (Generic) 1 x Per Day/15 Days ape Discharge Instructions: Secure dressing with tape as directed. WOUND #7: - Upper Leg Wound Laterality: Left, Medial Cleanser: Anasept Antimicrobial Skin and Wound Cleanser, 8 (oz) (Generic) 1 x Per Day/15 Days Discharge Instructions: Cleanse the wound with Anasept cleanser prior to applying a clean dressing using gauze sponges, not tissue or cotton balls. Prim Dressing: Santyl Ointment 1 x Per Day/15 Days ary Discharge Instructions: Apply nickel thick amount to wound bed as instructed Secondary Dressing: Woven Gauze Sponge, Non-Sterile 4x4 in (Generic) 1 x Per Day/15 Days Discharge Instructions: apply gauze over primary dressing as directed. Secondary Dressing: ABD Pad, 5x9 (Generic) 1 x Per Day/15 Days Discharge Instructions: Apply over primary dressing as directed. Secured With: 37M Medipore H Soft Cloth Surgical T 4 x 2 (in/yd) (Generic) 1 x Per Day/15 Days ape Discharge Instructions: Secure dressing with tape as directed. WOUND #8: - Upper Leg Wound Laterality: Left, Lateral Cleanser: Anasept Antimicrobial Skin and Wound Cleanser, 8  (oz) (Generic) 1 x Per Day/15 Days Discharge Instructions: Cleanse the wound with Anasept cleanser prior to applying a clean dressing using gauze sponges, not tissue or cotton balls. Prim Dressing: Santyl Ointment 1 x Per Day/15 Days ary Discharge Instructions: Apply nickel thick amount to wound bed as instructed Secondary Dressing: Woven Gauze Sponge, Non-Sterile 4x4 in (Generic) 1 x Per Day/15 Days Discharge Instructions: Apply Apply saline moisten gauze backing the santyl over primary dressing as directed. Secondary Dressing: ABD Pad, 5x9 (Generic) 1 x Per Day/15 Days Discharge Instructions: Apply over primary dressing as directed. Secondary Dressing: Zetuvit Plus 4x8 in (Generic) 1 x Per Day/15 Days Discharge Instructions: Apply over primary dressing as directed. Secondary Dressing: CarboFLEX Odor Control Dressing, 4x4 in (Generic) 1 x Per Day/15 Days Discharge Instructions: Apply over primary dressing as directed. Secured With: 37M Medipore H Soft Cloth Surgical T 4 x 2 (in/yd) (Generic) 1 x Per Day/15 Days ape Discharge Instructions: Secure dressing with tape as directed. 1. In office sharp debridement 2. Santyl to the left lower extremity wounds and silver alginate to the right 3. Follow-up in 1 week Electronic Signature(s) Signed: 05/12/2021 5:03:41 PM By: Kalman Shan DO Entered By: Kalman Shan on 05/12/2021 17:01:45 -------------------------------------------------------------------------------- HxROS Details Patient Name: Date of Service: [place] RTO N, [place] RGA RET  D. 05/12/2021 3:15 PM Medical Record Number: [id number] Patient Account Number: [id number] Date of Birth/Sex: Treating RN: [birth year] (70 y.o. Yesenia Payne, Yesenia Payne Primary Care Provider: [name] Other Clinician: Referring Provider: Treating Provider/Extender: Alysia Penna in Treatment: 1 Information Obtained From Patient Eyes Medical History: Positive for: Cataracts - lens implant  done; Glaucoma Negative for: Optic Neuritis Ear/Nose/Mouth/Throat Medical History: Positive for: Chronic sinus problems/congestion - seasonal Negative for: Middle ear problems Hematologic/Lymphatic Medical History: Negative for: Anemia; Hemophilia; Human Immunodeficiency Virus; Lymphedema; Sickle Cell Disease Respiratory Medical History: Positive for: Asthma Negative for: Aspiration; Chronic Obstructive Pulmonary Disease (COPD); Pneumothorax; Sleep Apnea; Tuberculosis Cardiovascular Medical History: Positive for: Hypertension - not on meds presently Negative for: Angina; Arrhythmia; Congestive Heart Failure; Coronary Artery Disease; Deep Vein Thrombosis; Hypotension; Myocardial Infarction; Peripheral Arterial Disease; Peripheral Venous Disease; Phlebitis; Vasculitis Past Medical History Notes: Hyperlipidemia Gastrointestinal Medical History: Negative for: Cirrhosis ; Colitis; Crohns; Hepatitis A; Hepatitis B; Hepatitis C Past Medical History Notes: GERD, Diverticulitis Endocrine Medical History: Positive for: Type II Diabetes Negative for: Type I Diabetes Time with diabetes: 12 years Treated with: Insulin Blood sugar tested every day: Yes T ested : bid Blood sugar testing results: Breakfast: 140 max; Bedtime: 220's max Genitourinary Medical History: Negative for: End Stage Renal Disease Past Medical History Notes: Overactive Bladder Immunological Medical History: Negative for: Lupus Erythematosus; Raynauds; Scleroderma Integumentary (Skin) Medical History: Negative for: History of Burn Musculoskeletal Medical History: Positive for: Osteoarthritis Negative for: Gout; Rheumatoid Arthritis; Osteomyelitis Past Medical History Notes: Fibromyalgia, toe amputations x 3 Neurologic Medical History: Positive for: Neuropathy Negative for: Dementia; Quadriplegia; Paraplegia; Seizure Disorder Oncologic Medical History: Negative for: Received Chemotherapy; Received  Radiation Psychiatric Medical History: Negative for: Anorexia/bulimia; Confinement Anxiety Past Medical History Notes: Anxiety HBO Extended History Items Ear/Nose/Mouth/Throat: Eyes: Eyes: Chronic sinus Cataracts Glaucoma problems/congestion Immunizations Pneumococcal Vaccine: Received Pneumococcal Vaccination: Yes Immunization Notes: up to date on pneumonia and tetanus shots Implantable Devices None Family and Social History Cancer: Yes - Child; Diabetes: Yes - Father; Heart Disease: Yes - Mother,Father; Hereditary Spherocytosis: No; Hypertension: Yes - Mother,Father; Kidney Disease: No; Lung Disease: No; Seizures: No; Stroke: No; Thyroid Problems: No; Tuberculosis: No; Never smoker; Marital Status - Married; Alcohol Use: Never; Drug Use: No History; Caffeine Use: Daily - coffee , sodas; Financial Concerns: No; Food, Clothing or Shelter Needs: No; Support System Lacking: No; Transportation Concerns: No Electronic Signature(s) Signed: 05/12/2021 5:03:41 PM By: Kalman Shan DO Signed: 05/12/2021 6:55:28 PM By: Deon Pilling Entered By: Kalman Shan on 05/12/2021 16:58:19 -------------------------------------------------------------------------------- SuperBill Details Patient Name: Date of Service: [name], [name] 05/12/2021 Medical Record Number: [id number] Patient Account Number: [id number] Date of Birth/Sex: Treating RN: June 20, 1951 (70 y.o. 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 Facility  Procedures CPT4 Code: [id number] Description: 40102 - DEBRIDE W/O ANES NON SELECT Modifier: 59 Quantity: 1 CPT4 Code: 72536644 Description: 03474 - DEBRIDE WOUND 1ST 20 SQ CM OR < ICD-10 Diagnosis Description 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 Modifier: ity Quantity: 1 CPT4 Code: [id number] Description: 56433 - DEBRIDE WOUND EA ADDL 20 SQ  CM ICD-10 Diagnosis Description 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

## 2021-05-18 ENCOUNTER — Telehealth: Payer: Self-pay | Admitting: Orthopedic Surgery

## 2021-05-18 NOTE — Telephone Encounter (Signed)
Pt called asking for a refill on her methocarbamol prescription. The best pharmacy is the one on file and the best call back number is [phone].

## 2021-05-18 NOTE — Telephone Encounter (Signed)
We havent seen her since march. She needs to get this through pain management or PCP.

## 2021-05-19 ENCOUNTER — Other Ambulatory Visit: Payer: Self-pay | Admitting: Physician Assistant

## 2021-05-19 NOTE — Telephone Encounter (Signed)
I called patient's daughter, Efraim Kaufmann, and advised. She is upset and states that she is sure Dr. Lajoyce Corners would approve this and does not understand why I sent the message to [facility]. I advised that Dr. Lajoyce Corners is out of the office and per our protocols here in the office, his PA receives his messages to help facilitate good patient care. She states that her mom is completely out of medication and will need some to get her through until she is able to see someone. She is unable to get her here except through EMS transport. She asks that [place] returns call to her. Please advise.

## 2021-05-19 NOTE — Telephone Encounter (Signed)
I spoke with the patient's daughter.  I explained to her that we have not seen her mother in the office since March and that long-term muscle relaxants in the elderly needs to be monitored.  I had recommended they go through her primary care physician or her chronic pain management.  She demanded that I fill it when I told her that I could not do that she said she would talk to Dr. Lajoyce Corners next week and hung up on me

## 2021-05-23 DIAGNOSIS — G894 Chronic pain syndrome: Secondary | ICD-10-CM | POA: Diagnosis not present

## 2021-05-23 DIAGNOSIS — Z89519 Acquired absence of unspecified leg below knee: Secondary | ICD-10-CM | POA: Diagnosis not present

## 2021-05-23 DIAGNOSIS — E1165 Type 2 diabetes mellitus with hyperglycemia: Secondary | ICD-10-CM | POA: Diagnosis not present

## 2021-05-23 DIAGNOSIS — I10 Essential (primary) hypertension: Secondary | ICD-10-CM | POA: Diagnosis not present

## 2021-05-26 ENCOUNTER — Encounter (HOSPITAL_BASED_OUTPATIENT_CLINIC_OR_DEPARTMENT_OTHER): Payer: Medicare Other | Admitting: Internal Medicine

## 2021-05-26 ENCOUNTER — Other Ambulatory Visit: Payer: Self-pay

## 2021-05-26 DIAGNOSIS — Z89512 Acquired absence of left leg below knee: Secondary | ICD-10-CM | POA: Diagnosis not present

## 2021-05-26 DIAGNOSIS — R279 Unspecified lack of coordination: Secondary | ICD-10-CM | POA: Diagnosis not present

## 2021-05-26 DIAGNOSIS — Z7401 Bed confinement status: Secondary | ICD-10-CM | POA: Diagnosis not present

## 2021-05-26 DIAGNOSIS — R531 Weakness: Secondary | ICD-10-CM | POA: Diagnosis not present

## 2021-05-26 DIAGNOSIS — E11622 Type 2 diabetes mellitus with other skin ulcer: Secondary | ICD-10-CM | POA: Diagnosis not present

## 2021-05-26 DIAGNOSIS — E1142 Type 2 diabetes mellitus with diabetic polyneuropathy: Secondary | ICD-10-CM | POA: Diagnosis not present

## 2021-05-26 DIAGNOSIS — L97819 Non-pressure chronic ulcer of other part of right lower leg with unspecified severity: Secondary | ICD-10-CM | POA: Diagnosis not present

## 2021-05-26 DIAGNOSIS — Z833 Family history of diabetes mellitus: Secondary | ICD-10-CM | POA: Diagnosis not present

## 2021-05-26 DIAGNOSIS — L97829 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ity: Secondary | ICD-10-CM | POA: Diagnosis not present

## 2021-05-26 NOTE — Progress Notes (Signed)
KAJA, JACKOWSKI ([id number]) Visit Report for 05/26/2021 Arrival Information Details Patient Name: Date of Service: [name], [name] 05/26/2021 2:30 PM Medical Record Number: [id number] Patient Account Number: [id number] Date of Birth/Sex: Treating RN: [birth year] (70 y.o. [name], Tammi Klippel Primary Care Annalyse Langlais: [name] Other Clinician: Referring Cniyah Sproull: Treating Jeriah Corkum/Extender: Alysia Penna in Treatment: 3 Visit Information History Since Last Visit Added or deleted any medications: No Patient Arrived: Stretcher Any new allergies or adverse reactions: No Arrival Time: 15:31 Had a fall or experienced change in No Accompanied By: daughter activities of daily living that may affect Transfer Assistance: None risk of falls: Patient Identification Verified: Yes Signs or symptoms of abuse/neglect since last visito No Secondary Verification Process Completed: Yes Hospitalized since last visit: No Patient Requires Transmission-Based Precautions: No Implantable device outside of the clinic excluding No Patient Has Alerts: No cellular tissue based products placed in the center since last visit: Has Dressing in Place as Prescribed: Yes Pain Present Now: Yes Electronic Signature(s) Signed: 05/26/2021 5:17:35 PM By: Sandre Kitty Entered By: Sandre Kitty on 05/26/2021 15:31:55 -------------------------------------------------------------------------------- Lower Extremity Assessment Details Patient Name: Date of Service: [name], [name] 05/26/2021 2:30 PM Medical Record Number: [id number] Patient Account Number: [id number] Date of Birth/Sex: Treating RN: 10-02-1951 (70 y.o. [name] Primary Care Mar Walmer: [name] Other Clinician: Referring Iria Jamerson: Treating Kamdin Follett/Extender: Alysia Penna in Treatment: 3 Electronic Signature(s) Signed: 05/26/2021 6:09:20 PM By: Deon Pilling Entered By:  Deon Pilling on 05/26/2021 15:45:49 -------------------------------------------------------------------------------- Multi Wound Chart Details Patient Name: Date of Service: [name], [name] 05/26/2021 2:30 PM Medical Record Number: [id number] Patient Account Number: [id number] Date of Birth/Sex: Treating RN: [birth year] (70 y.o. [name], Meta.Reding Primary Care Arnitra Sokoloski: [name] Other Clinician: Referring Brysyn Brandenberger: Treating Amiri Tritch/Extender: Alysia Penna in Treatment: 3 Vital Signs Height(in): 9 Pulse(bpm): 67 Weight(lbs): 35 Blood Pressure(mmHg): 141/79 Body Mass Index(BMI): 49 Temperature(F): 98.6 Respiratory Rate(breaths/min): 18 Photos: Right Upper Leg Left, Medial Upper Leg Left, Lateral Upper Leg Wound Location: Gradually Appeared Gradually Appeared Gradually Appeared Wounding Event: Diabetic Wound/Ulcer of the Lower Diabetic Wound/Ulcer of the Lower Diabetic Wound/Ulcer of the Lower Primary Etiology: Extremity Extremity Extremity Cataracts, Glaucoma, Chronic sinus Cataracts, Glaucoma, Chronic sinus Cataracts, Glaucoma, Chronic sinus Comorbid History: problems/congestion, Asthma, problems/congestion, Asthma, problems/congestion, Asthma, Hypertension, Type II Diabetes, Hypertension, Type II Diabetes, Hypertension, Type II Diabetes, Osteoarthritis, Neuropathy Osteoarthritis, Neuropathy Osteoarthritis, Neuropathy 11/06/2020 01/06/2021 01/06/2021 Date Acquired: 3 3 3  Weeks of Treatment: Open Open Open Wound Status: No Yes No Clustered Wound: N/A 2 N/A Clustered Quantity: 1.5x3x0.5 8x2x0.1 14.5x7.5x3.1 Measurements L x W x D (cm) 3.534 12.566 85.412 A (cm) : rea 1.767 1.257 264.777 Volume (cm) : 87.50% 70.40% 24.50% % Reduction in A rea: 93.80% 85.20% 6.40% % Reduction in Volume: Grade 2 Grade 2 Grade 2 Classification: Medium Medium Large Exudate A mount: Serosanguineous Serosanguineous Purulent Exudate Type: red,  brown red, brown yellow, brown, green Exudate Color: No No Yes Foul Odor A Cleansing: fter N/A N/A No Odor A nticipated Due to Product Use: Flat and Intact Flat and Intact Thickened Wound Margin: Large (67-100%) Large (67-100%) Small (1-33%) Granulation A mount: Red, Pink Red, Pink, Hyper-granulation Red Granulation Quality: None Present (0%) Small (1-33%) Large (67-100%) Necrotic A mount: Fat Layer (Subcutaneous Tissue): Yes Fat Layer (Subcutaneous Tissue): Yes Fat Layer (Subcutaneous Tissue): Yes Exposed Structures: Fascia: No Fascia: No Muscle: Yes Tendon: No Tendon: No Fascia: No Muscle:  No Muscle: No Tendon: No Joint: No Joint: No Joint: No Bone: No Bone: No Bone: No None Small (1-33%) None Epithelialization: N/A N/A Debridement - Excisional Debridement: Pre-procedure Verification/Time Out N/A N/A 16:10 Taken: N/A N/A Other Pain Control: N/A N/A Fat, Subcutaneous, Slough Tissue Debrided: N/A N/A Skin/Subcutaneous Tissue Level: N/A N/A 108.75 Debridement A (sq cm): rea N/A N/A Curette Instrument: N/A N/A Minimum Bleeding: N/A N/A Pressure Hemostasis A chieved: N/A N/A 0 Procedural Pain: N/A N/A 0 Post Procedural Pain: N/A N/A Procedure was tolerated well Debridement Treatment Response: N/A N/A 14.5x7.5x3.1 Post Debridement Measurements L x W x D (cm) N/A N/A 264.777 Post Debridement Volume: (cm) N/A N/A Debridement Procedures Performed: Treatment Notes Electronic Signature(s) Signed: 05/26/2021 5:10:25 PM By: Kalman Shan DO Signed: 05/26/2021 6:09:20 PM By: Deon Pilling Entered By: Kalman Shan on 05/26/2021 17:02:37 -------------------------------------------------------------------------------- Multi-Disciplinary Care Plan Details Patient Name: Date of Service: [name], [name] 05/26/2021 2:30 PM Medical Record Number: [id number] Patient Account Number: [id number] Date of Birth/Sex: Treating RN: Dec 02, 1950 (70 y.o. [name], Tammi Klippel Primary Care Alonni Heimsoth: [name] Other Clinician: Referring Shandora Koogler: Treating Lenzy Kerschner/Extender: Alysia Penna in Treatment: 3 Active Inactive Wound/Skin Impairment Nursing Diagnoses: Impaired tissue integrity Knowledge deficit related to ulceration/compromised skin integrity Goals: Patient will demonstrate a reduced rate of smoking or cessation of smoking Date Initiated: 04/30/2021 Target Resolution Date: 06/05/2021 Goal Status: Active Patient will have a decrease in wound volume by X% from date: (specify in notes) Date Initiated: 04/30/2021 Target Resolution Date: 06/04/2021 Goal Status: Active Patient/caregiver will verbalize understanding of skin care regimen Date Initiated: 04/30/2021 Target Resolution Date: 06/05/2021 Goal Status: Active Interventions: Assess patient/caregiver ability to obtain necessary supplies Assess patient/caregiver ability to perform ulcer/skin care regimen upon admission and as needed Assess ulceration(s) every visit Notes: Electronic Signature(s) Signed: 05/26/2021 6:09:20 PM By: Deon Pilling Entered By: Deon Pilling on 05/26/2021 15:51:27 -------------------------------------------------------------------------------- Pain Assessment Details Patient Name: Date of Service: [name], [name] 05/26/2021 2:30 PM Medical Record Number: [id number] Patient Account Number: [id number] Date of Birth/Sex: Treating RN: Jan 06, 1951 (70 y.o. [name] Primary Care Landy Mace: [name] Other Clinician: Referring Darcy Barbara: Treating Laria Grimmett/Extender: Alysia Penna in Treatment: 3 Active Problems Location of Pain Severity and Description of Pain Patient Has Paino Yes Site Locations Rate the pain. Rate the pain. Current Pain Level: 4 Pain Management and Medication Current Pain Management: Electronic Signature(s) Signed: 05/26/2021 5:17:35 PM By: Sandre Kitty Signed:  05/26/2021 6:09:20 PM By: Deon Pilling Entered By: Sandre Kitty on 05/26/2021 15:32:19 -------------------------------------------------------------------------------- Patient/Caregiver Education Details Patient Name: Date of Service: [name], [name] 7/19/2022andnbsp2:30 PM Medical Record Number: [id number] Patient Account Number: [id number] Date of Birth/Gender: Treating RN: 05-25-1951 (70 y.o. [name] Primary Care Physician: [name] Other Clinician: Referring Physician: Treating Physician/Extender: Alysia Penna in Treatment: 3 Education Assessment Education Provided To: Patient Education Topics Provided Wound/Skin Impairment: Handouts: Skin Care Do's and Dont's Methods: Explain/Verbal Responses: Reinforcements needed Electronic Signature(s) Signed: 05/26/2021 6:09:20 PM By: Deon Pilling Entered By: Deon Pilling on 05/26/2021 15:51:38 -------------------------------------------------------------------------------- Wound Assessment Details Patient Name: Date of Service: [name], [name] 05/26/2021 2:30 PM Medical Record Number: [id number] Patient Account Number: [id number] Date of Birth/Sex: Treating RN: November 27, 1950 (70 y.o. [name] Primary Care Ahmod Gillespie: [name] Other Clinician: Referring Lyrick Lagrand: Treating Jkwon Treptow/Extender: Alysia Penna in Treatment: 3 Wound Status Wound Number: 6 Primary Diabetic Wound/Ulcer  of the Lower Extremity Etiology: Wound Location: Right Upper Leg Wound Open Wounding Event: Gradually Appeared Status: Date Acquired: 11/06/2020 Comorbid Cataracts, Glaucoma, Chronic sinus problems/congestion, Asthma, Weeks Of Treatment: 3 History: Hypertension, Type II Diabetes, Osteoarthritis, Neuropathy Clustered Wound: No Photos Photo Uploaded By: Sandre Kitty on 05/26/2021 16:58:58 Wound Measurements Length: (cm) 1.5 Width: (cm) 3 Depth: (cm)  0.5 Area: (cm) 3.534 Volume: (cm) 1.767 % Reduction in Area: 87.5% % Reduction in Volume: 93.8% Epithelialization: None Tunneling: No Undermining: No Wound Description Classification: Grade 2 Wound Margin: Flat and Intact Exudate Amount: Medium Exudate Type: Serosanguineous Exudate Color: red, brown Foul Odor After Cleansing: No Slough/Fibrino No Wound Bed Granulation Amount: Large (67-100%) Exposed Structure Granulation Quality: Red, Pink Fascia Exposed: No Necrotic Amount: None Present (0%) Fat Layer (Subcutaneous Tissue) Exposed: Yes Tendon Exposed: No Muscle Exposed: No Joint Exposed: No Bone Exposed: No Electronic Signature(s) Signed: 05/26/2021 6:09:20 PM By: Deon Pilling Entered By: Deon Pilling on 05/26/2021 15:49:19 -------------------------------------------------------------------------------- Wound Assessment Details Patient Name: Date of Service: [name], [name] 05/26/2021 2:30 PM Medical Record Number: [id number] Patient Account Number: [id number] Date of Birth/Sex: Treating RN: 19-Jan-1951 (70 y.o. [name], Meta.Reding Primary Care Katura Eatherly: [name] Other Clinician: Referring Alyssa Rotondo: Treating Charika Mikelson/Extender: Alysia Penna in Treatment: 3 Wound Status Wound Number: 7 Primary Diabetic Wound/Ulcer of the Lower Extremity Etiology: Etiology: Wound Location: Left, Medial Upper Leg Wound Open Wounding Event: Gradually Appeared Status: Date Acquired: 01/06/2021 Comorbid Cataracts, Glaucoma, Chronic sinus problems/congestion, Asthma, Weeks Of Treatment: 3 History: Hypertension, Type II Diabetes, Osteoarthritis, Neuropathy Clustered Wound: Yes Photos Photo Uploaded By: Sandre Kitty on 05/26/2021 16:58:35 Wound Measurements Length: (cm) 8 Width: (cm) 2 Depth: (cm) 0.1 Clustered Quantity: 2 Area: (cm) 12.566 Volume: (cm) 1.257 % Reduction in Area: 70.4% % Reduction in Volume: 85.2% Epithelialization:  Small (1-33%) Tunneling: No Undermining: No Wound Description Classification: Grade 2 Wound Margin: Flat and Intact Exudate Amount: Medium Exudate Type: Serosanguineous Exudate Color: red, brown Foul Odor After Cleansing: No Slough/Fibrino Yes Wound Bed Granulation Amount: Large (67-100%) Exposed Structure Granulation Quality: Red, Pink, Hyper-granulation Fascia Exposed: No Necrotic Amount: Small (1-33%) Fat Layer (Subcutaneous Tissue) Exposed: Yes Necrotic Quality: Adherent Slough Tendon Exposed: No Muscle Exposed: No Joint Exposed: No Bone Exposed: No Electronic Signature(s) Signed: 05/26/2021 6:09:20 PM By: Deon Pilling Entered By: Deon Pilling on 05/26/2021 15:50:09 -------------------------------------------------------------------------------- Wound Assessment Details Patient Name: Date of Service: [name], [name] 05/26/2021 2:30 PM Medical Record Number: [id number] Patient Account Number: [id number] Date of Birth/Sex: Treating RN: December 31, 1950 (70 y.o. [name], Meta.Reding Primary Care Rogenia Werntz: [name] Other Clinician: Referring Wandalee Klang: Treating Izzy Courville/Extender: Alysia Penna in Treatment: 3 Wound Status Wound Number: 8 Primary Diabetic Wound/Ulcer of the Lower Extremity Etiology: Wound Location: Left, Lateral Upper Leg Wound Open Wounding Event: Gradually Appeared Status: Date Acquired: 01/06/2021 Comorbid Cataracts, Glaucoma, Chronic sinus problems/congestion, Asthma, Weeks Of Treatment: 3 History: Hypertension, Type II Diabetes, Osteoarthritis, Neuropathy Clustered Wound: No Photos Photo Uploaded By: Sandre Kitty on 05/26/2021 16:58:38 Wound Measurements Length: (cm) 14.5 Width: (cm) 7.5 Depth: (cm) 3.1 Area: (cm) 85.412 Volume: (cm) 264.777 % Reduction in Area: 24.5% % Reduction in Volume: 6.4% Epithelialization: None Tunneling: No Undermining: No Wound Description Classification: Grade 2 Wound  Margin: Thickened Exudate Amount: Large Exudate Type: Purulent Exudate Color: yellow, brown, green Foul Odor After Cleansing: Yes Due to Product Use: No Slough/Fibrino Yes Wound Bed Granulation Amount: Small (1-33%) Exposed Structure Granulation Quality: Red Fascia Exposed: No  Necrotic Amount: Large (67-100%) Fat Layer (Subcutaneous Tissue) Exposed: Yes Necrotic Quality: Adherent Slough Tendon Exposed: No Muscle Exposed: Yes Necrosis of Muscle: No Joint Exposed: No Bone Exposed: No Electronic Signature(s) Signed: 05/26/2021 6:09:20 PM By: Deon Pilling Entered By: Deon Pilling on 05/26/2021 15:51:04 -------------------------------------------------------------------------------- Vitals Details Patient Name: Date of Service: [name], [name] 05/26/2021 2:30 PM Medical Record Number: [id number] Patient Account Number: [id number] Date of Birth/Sex: Treating RN: Nov 26, 1950 (70 y.o. [name], Meta.Reding Primary Care Anay Walter: [name] Other Clinician: Referring Dalton Molesworth: Treating Jacqualine Weichel/Extender: Alysia Penna in Treatment: 3 Vital Signs Time Taken: 15:31 Temperature (F): 98.6 Height (in): 63 Pulse (bpm): 78 Weight (lbs): 279 Respiratory Rate (breaths/min): 18 Body Mass Index (BMI): 49.4 Blood Pressure (mmHg): 141/79 Reference Range: 80 - 120 mg / dl Electronic Signature(s) Signed: 05/26/2021 5:17:35 PM By: Sandre Kitty Entered By: Sandre Kitty on 05/26/2021 15:32:09

## 2021-05-26 NOTE — Progress Notes (Signed)
Yesenia Payne, Yesenia Payne ([id number]) Visit Report for 05/26/2021 Chief Complaint Document Details Patient Name: Date of Service: [name], [name] 05/26/2021 2:30 PM Medical Record Number: [id number] Patient Account Number: [id number] Date of Birth/Sex: Treating RN: Aug 03, 1951 (70 y.o. [name] Primary Care Provider: [name] Other Clinician: Referring Provider: Treating Provider/Extender: Alysia Penna in Treatment: 3 Information Obtained from: Patient Chief Complaint Left lower extremity wounds and right upper leg wound Electronic Signature(s) Signed: 05/26/2021 5:10:25 PM By: Kalman Shan DO Entered By: Kalman Shan on 05/26/2021 17:03:05 -------------------------------------------------------------------------------- Debridement Details Patient Name: Date of Service: [name], [name] 05/26/2021 2:30 PM Medical Record Number: [id number] Patient Account Number: [id number] Date of Birth/Sex: Treating RN: 1951/06/30 (70 y.o. [name], Meta.Reding Primary Care Provider: [name] Other Clinician: Referring Provider: Treating Provider/Extender: Alysia Penna in Treatment: 3 Debridement Performed for Assessment: Wound #8 Left,Lateral Upper Leg Performed By: Physician Kalman Shan, DO Debridement Type: Debridement Severity of Tissue Pre Debridement: Fat layer exposed Level of Consciousness (Pre-procedure): Awake and Alert Pre-procedure Verification/Time Out Yes - 16:10 Taken: Start Time: 16:11 Pain Control: Other : benzocaine 20% T Area Debrided (L x W): otal 14.5 (cm) x 7.5 (cm) = 108.75 (cm) Tissue and other material debrided: Viable, Non-Viable, Fat, Slough, Subcutaneous, Skin: Dermis , Fibrin/Exudate, Slough Level: Skin/Subcutaneous Tissue Debridement Description: Excisional Instrument: Curette Bleeding: Minimum Hemostasis Achieved: Pressure End Time: 16:17 Procedural Pain: 0 Post Procedural  Pain: 0 Response to Treatment: Procedure was tolerated well Level of Consciousness (Post- Awake and Alert procedure): Post Debridement Measurements of Total Wound Length: (cm) 14.5 Width: (cm) 7.5 Depth: (cm) 3.1 Volume: (cm) 264.777 Character of Wound/Ulcer Post Debridement: Requires Further Debridement Severity of Tissue Post Debridement: Fat layer exposed Post Procedure Diagnosis Same as Pre-procedure Electronic Signature(s) Signed: 05/26/2021 5:10:25 PM By: Kalman Shan DO Signed: 05/26/2021 6:09:20 PM By: Deon Pilling Entered By: Deon Pilling on 05/26/2021 16:17:42 -------------------------------------------------------------------------------- HPI Details Patient Name: Date of Service: [name], [name] 05/26/2021 2:30 PM Medical Record Number: [id number] Patient Account Number: [id number] Date of Birth/Sex: Treating RN: 23-Feb-1951 (70 y.o. [name] Primary Care Provider: [name] Other Clinician: Referring Provider: Treating Provider/Extender: Alysia Penna in Treatment: 3 History of Present Illness HPI Description: Admission 04/30/2021 Ms. 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 She has been seen in our clinic multiple times last seen in 03/2019 for right toe wounds. Today she presents for 3 wounds. 1 is located to the right upper leg and 2 to the left lower extremity. They started at the end of December And not sure how I started. She had not been using any dressings up until 2 weeks ago. She is currently using silver alginate to the right upper leg and Santyl to the wounds on the left leg. She has been on doxycycline and 2 rounds of clindamycin for cellulitis to these wounds. She finished her last round of antibiotics 3 weeks ago. She reports minimal pain to the wound. She currently  denies signs of infection including increased erythema, warmth or purulent drainage. 7/5; patient presents for 1 week follow-up. She has been using Santyl to the left lower extremity wounds and silver alginate to the right upper leg wound. She denies signs of infection. She has no complaints today. 7/19; patient presents for 1 week follow-up. She has  been using Santyl to the left lower extremity wounds and silver alginate to the right upper leg wound. She denies signs of infection. She has had to use a lot of Santyl to the lateral wound since the necrotic tissue has been breaking down and the wound is deeper. Electronic Signature(s) Signed: 05/26/2021 5:10:25 PM By: Kalman Shan DO Entered By: Kalman Shan on 05/26/2021 17:04:23 -------------------------------------------------------------------------------- Physical Exam Details Patient Name: Date of Service: [name], [name] 05/26/2021 2:30 PM Medical Record Number: [id number] Patient Account Number: [id number] Date of Birth/Sex: Treating RN: 09/08/1951 (70 y.o. [name] Primary Care Provider: [name] Other Clinician: Referring Provider: Treating Provider/Extender: Alysia Penna in Treatment: 3 Constitutional respirations regular, non-labored and within target range for patient.Marland Kitchen Psychiatric pleasant and cooperative. Notes Right lower extremity: On the upper leg she has a open wound with granulation tissue present. This appears clean and healthy. Left lower extremity: On the medial aspect there are 2 wounds close to each other that is limited to skin breakdown with scant nonviable tissue present But appears well-healing. T the o lateral side there is significant nonviable tissue. Fat is exposed. No obvious signs of infection to any of the wounds Electronic Signature(s) Signed: 05/26/2021 5:10:25 PM By: Kalman Shan DO Entered By: Kalman Shan on 05/26/2021  17:05:18 -------------------------------------------------------------------------------- Physician Orders Details Patient Name: Date of Service: [name], [name] 05/26/2021 2:30 PM Medical Record Number: [id number] Patient Account Number: [id number] Date of Birth/Sex: Treating RN: 09-20-1951 (70 y.o. [name], Meta.Reding Primary Care Provider: [name] Other Clinician: Referring Provider: Treating Provider/Extender: Alysia Penna in Treatment: 3 Verbal / Phone Orders: No Diagnosis Coding ICD-10 Coding Code Description [phone] 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ity L97.819 Non-pressure chronic ulcer of other part of right lower leg with unspecified severity E11.9 Type 2 diabetes mellitus without complications O87.579 Acquired absence of left leg below knee Follow-up Appointments ppointment in 2 weeks. - Dr. Heber [place] 06/09/2021 ***60 minutes extra time*** Return A Nurse Visit: - Tuesday 06/02/2021 ***60 minutes extra time**** nurse to teach daughter Lenna Sciara how to apply wound vac at home. Bathing/ Shower/ Hygiene Other Bathing/Shower/Hygiene Orders/Instructions: - You may sponge bathe Negative Presssure Wound Therapy Wound #8 Left,Lateral Upper Leg Wound Vac to wound continuously at 125m/hg pressure - change three times a week. Black Foam Off-Loading Turn and reposition every 2 hours Wound Treatment Wound #6 - Upper Leg Wound Laterality: Right Cleanser: Wound Cleanser (Generic) 1 x Per Day/15 Days Discharge Instructions: Cleanse the wound with wound cleanser prior to applying a clean dressing using gauze sponges, not tissue or cotton balls. Prim Dressing: KerraCel Ag Gelling Fiber Dressing, 4x5 in (silver alginate) (Generic) 1 x Per Day/15 Days ary Discharge Instructions: Apply silver alginate to wound bed as instructed Secondary Dressing: Woven Gauze Sponge, Non-Sterile 4x4 in (Generic) 1 x Per Day/15  Days Discharge Instructions: Apply over primary dressing as directed. Secondary Dressing: ABD Pad, 5x9 (Generic) 1 x Per Day/15 Days Discharge Instructions: Apply over primary dressing as directed. Secured With: 29M Medipore H Soft Cloth Surgical T 4 x 2 (in/yd) (Generic) 1 x Per Day/15 Days ape Discharge Instructions: Secure dressing with tape as directed. Wound #7 - Upper Leg Wound Laterality: Left, Medial Cleanser: Anasept Antimicrobial Skin and Wound Cleanser, 8 (oz) (Generic) 1 x Per Day/15 Days Discharge Instructions: Cleanse the wound with Anasept cleanser prior to applying a clean dressing using gauze sponges,  not tissue or cotton balls. Prim Dressing: Santyl Ointment 1 x Per Day/15 Days ary Discharge Instructions: Apply nickel thick amount to wound bed as instructed Secondary Dressing: Woven Gauze Sponge, Non-Sterile 4x4 in (Generic) 1 x Per Day/15 Days Discharge Instructions: apply gauze over primary dressing as directed. Secondary Dressing: ABD Pad, 5x9 (Generic) 1 x Per Day/15 Days Discharge Instructions: Apply over primary dressing as directed. Secured With: 31M Medipore H Soft Cloth Surgical T 4 x 2 (in/yd) (Generic) 1 x Per Day/15 Days ape Discharge Instructions: Secure dressing with tape as directed. Wound #8 - Upper Leg Wound Laterality: Left, Lateral Cleanser: Anasept Antimicrobial Skin and Wound Cleanser, 8 (oz) (Generic) 1 x Per Day/15 Days Discharge Instructions: Cleanse the wound with Anasept cleanser prior to applying a clean dressing using gauze sponges, not tissue or cotton balls. Prim Dressing: dakin's solution 1 x Per Day/15 Days ary Discharge Instructions: moisten gauze with dakin's solution Secondary Dressing: Woven Gauze Sponge, Non-Sterile 4x4 in (Generic) 1 x Per Day/15 Days Discharge Instructions: Apply Apply saline moisten gauze backing the santyl over primary dressing as directed. Secondary Dressing: ABD Pad, 5x9 (Generic) 1 x Per Day/15 Days Discharge  Instructions: Apply over primary dressing as directed. Secondary Dressing: Zetuvit Plus 4x8 in (Generic) 1 x Per Day/15 Days Discharge Instructions: Apply over primary dressing as directed. Secondary Dressing: CarboFLEX Odor Control Dressing, 4x4 in (Generic) 1 x Per Day/15 Days Discharge Instructions: Apply over primary dressing as directed. Secured With: 31M Medipore H Soft Cloth Surgical T 4 x 2 (in/yd) (Generic) 1 x Per Day/15 Days ape Discharge Instructions: Secure dressing with tape as directed. Patient Medications llergies: Iodinated Contrast- Oral and IV Dye A Notifications Medication Indication Start End 05/26/2021 Dakin's Solution DOSE 1 - miscellaneous 0.25 % solution - wet to dry dressing once daily Electronic Signature(s) Signed: 05/26/2021 5:16:31 PM By: Kalman Shan DO Previous Signature: 05/26/2021 5:10:25 PM Version By: Kalman Shan DO Entered By: Kalman Shan on 05/26/2021 17:16:31 -------------------------------------------------------------------------------- Problem List Details Patient Name: Date of Service: [name], [name] 05/26/2021 2:30 PM Medical Record Number: [id number] Patient Account Number: [id number] Date of Birth/Sex: Treating RN: 04-03-1951 (70 y.o. [name], Tammi Klippel Primary Care Provider: [name] Other Clinician: Referring Provider: Treating Provider/Extender: Alysia Penna in Treatment: 3 Active Problems ICD-10 Encounter Code Description Active Date MDM Diagnosis L97.829 Non-pressure chronic ulcer of other part of left lower leg with unspecified 04/30/2021 No Yes severity L97.819 Non-pressure chronic ulcer of other part of right lower leg with unspecified 04/30/2021 No Yes severity E11.9 Type 2 diabetes mellitus without complications 07/01/2352 No Yes Z89.512 Acquired absence of left leg below knee 04/30/2021 No Yes Inactive Problems Resolved Problems Electronic Signature(s) Signed: 05/26/2021  5:10:25 PM By: Kalman Shan DO Entered By: Kalman Shan on 05/26/2021 17:02:05 -------------------------------------------------------------------------------- Progress Note Details Patient Name: Date of Service: [name], [name] 05/26/2021 2:30 PM Medical Record Number: [id number] Patient Account Number: [id number] Date of Birth/Sex: Treating RN: 03/04/1951 (70 y.o. [name] Primary Care Provider: [name] Other Clinician: Referring Provider: Treating Provider/Extender: Alysia Penna in Treatment: 3 Subjective Chief Complaint Information obtained from Patient Left lower extremity wounds and right upper leg wound History of Present Illness (HPI) Admission 04/30/2021 Ms. 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 She has been seen in our clinic multiple times last seen in 03/2019  for right toe wounds. Today she presents for 3 wounds. 1 is located to the right upper leg and 2 to the left lower extremity. They started at the end of December And not sure how I started. She had not been using any dressings up until 2 weeks ago. She is currently using silver alginate to the right upper leg and Santyl to the wounds on the left leg. She has been on doxycycline and 2 rounds of clindamycin for cellulitis to these wounds. She finished her last round of antibiotics 3 weeks ago. She reports minimal pain to the wound. She currently denies signs of infection including increased erythema, warmth or purulent drainage. 7/5; patient presents for 1 week follow-up. She has been using Santyl to the left lower extremity wounds and silver alginate to the right upper leg wound. She denies signs of infection. She has no complaints today. 7/19; patient presents for 1 week follow-up. She has been using Santyl to the left lower  extremity wounds and silver alginate to the right upper leg wound. She denies signs of infection. She has had to use a lot of Santyl to the lateral wound since the necrotic tissue has been breaking down and the wound is deeper. Patient History Information obtained from Patient. Family History Cancer - Child, Diabetes - Father, Heart Disease - Mother,Father, Hypertension - Mother,Father, No family history of Hereditary Spherocytosis, Kidney Disease, Lung Disease, Seizures, Stroke, Thyroid Problems, Tuberculosis. Social History Never smoker, Marital Status - Married, Alcohol Use - Never, Drug Use - No History, Caffeine Use - Daily - coffee , sodas. Medical History Eyes Patient has history of Cataracts - lens implant done, Glaucoma Denies history of Optic Neuritis Ear/Nose/Mouth/Throat Patient has history of Chronic sinus problems/congestion - seasonal Denies history of Middle ear problems Hematologic/Lymphatic Denies history of Anemia, Hemophilia, Human Immunodeficiency Virus, Lymphedema, Sickle Cell Disease Respiratory Patient has history of Asthma Denies history of Aspiration, Chronic Obstructive Pulmonary Disease (COPD), Pneumothorax, Sleep Apnea, Tuberculosis Cardiovascular Patient has history of Hypertension - not on meds presently Denies history of Angina, Arrhythmia, Congestive Heart Failure, Coronary Artery Disease, Deep Vein Thrombosis, Hypotension, Myocardial Infarction, Peripheral Arterial Disease, Peripheral Venous Disease, Phlebitis, Vasculitis Gastrointestinal Denies history of Cirrhosis , Colitis, Crohnoos, Hepatitis A, Hepatitis B, Hepatitis C Endocrine Patient has history of Type II Diabetes Denies history of Type I Diabetes Genitourinary Denies history of End Stage Renal Disease Immunological Denies history of Lupus Erythematosus, Raynaudoos, Scleroderma Integumentary (Skin) Denies history of History of Burn Musculoskeletal Patient has history of  Osteoarthritis Denies history of Gout, Rheumatoid Arthritis, Osteomyelitis Neurologic Patient has history of Neuropathy Denies history of Dementia, Quadriplegia, Paraplegia, Seizure Disorder Oncologic Denies history of Received Chemotherapy, Received Radiation Psychiatric Denies history of Anorexia/bulimia, Confinement Anxiety Medical A Surgical History Notes nd Cardiovascular Hyperlipidemia Gastrointestinal GERD, Diverticulitis Genitourinary Overactive Bladder Musculoskeletal Fibromyalgia, toe amputations x 3 Psychiatric Anxiety Objective Constitutional respirations regular, non-labored and within target range for patient.. Vitals Time Taken: 3:31 PM, Height: 63 in, Weight: 279 lbs, BMI: 49.4, Temperature: 98.6 F, Pulse: 78 bpm, Respiratory Rate: 18 breaths/min, Blood Pressure: 141/79 mmHg. Psychiatric pleasant and cooperative. General Notes: Right lower extremity: On the upper leg she has a open wound with granulation tissue present. This appears clean and healthy. Left lower extremity: On the medial aspect there are 2 wounds close to each other that is limited to skin breakdown with scant nonviable tissue present But appears well- healing. T the lateral side there is significant nonviable tissue. Fat is exposed. No obvious  signs of infection to any of the wounds o Integumentary (Hair, Skin) Wound #6 status is Open. Original cause of wound was Gradually Appeared. The date acquired was: 11/06/2020. The wound has been in treatment 3 weeks. The wound is located on the Right Upper Leg. The wound measures 1.5cm length x 3cm width x 0.5cm depth; 3.534cm^2 area and 1.767cm^3 volume. There is Fat Layer (Subcutaneous Tissue) exposed. There is no tunneling or undermining noted. There is a medium amount of serosanguineous drainage noted. The wound margin is flat and intact. There is large (67-100%) red, pink granulation within the wound bed. There is no necrotic tissue within the wound  bed. Wound #7 status is Open. Original cause of wound was Gradually Appeared. The date acquired was: 01/06/2021. The wound has been in treatment 3 weeks. The wound is located on the Left,Medial Upper Leg. The wound measures 8cm length x 2cm width x 0.1cm depth; 12.566cm^2 area and 1.257cm^3 volume. There is Fat Layer (Subcutaneous Tissue) exposed. There is no tunneling or undermining noted. There is a medium amount of serosanguineous drainage noted. The wound margin is flat and intact. There is large (67-100%) red, pink, hyper - granulation within the wound bed. There is a small (1-33%) amount of necrotic tissue within the wound bed including Adherent Slough. Wound #8 status is Open. Original cause of wound was Gradually Appeared. The date acquired was: 01/06/2021. The wound has been in treatment 3 weeks. The wound is located on the Left,Lateral Upper Leg. The wound measures 14.5cm length x 7.5cm width x 3.1cm depth; 85.412cm^2 area and 264.777cm^3 volume. There is muscle and Fat Layer (Subcutaneous Tissue) exposed. There is no tunneling or undermining noted. There is a large amount of purulent drainage noted. Foul odor after cleansing was noted. The wound margin is thickened. There is small (1-33%) red granulation within the wound bed. There is a large (67-100%) amount of necrotic tissue within the wound bed including Adherent Slough. Assessment Active Problems ICD-10 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ity Non-pressure chronic ulcer of other part of right lower leg with unspecified severity Type 2 diabetes mellitus without complications Acquired absence of left leg below knee Patient's right upper extremity wound and left lower extremity medial wound are stable. They have granulation tissue present. The left medial wound has some scant adherent nonviable tissue present and I recommended using Santyl to just these areas. The lateral wound has nonviable  tissue and I attempted to debride this. There is significant fat exposed. I think at this point she may benefit from a wound VAC as all eschar has been removed. I recommended to use Dakin's wet to dry until she receives the wound VAC. Procedures Wound #8 Pre-procedure diagnosis of Wound #8 is a Diabetic Wound/Ulcer of the Lower Extremity located on the Left,Lateral Upper Leg .Severity of Tissue Pre Debridement is: Fat layer exposed. There was a Excisional Skin/Subcutaneous Tissue Debridement with a total area of 108.75 sq cm performed by Kalman Shan, DO. With the following instrument(s): Curette to remove Viable and Non-Viable tissue/material. Material removed includes Fat, Subcutaneous Tissue, Slough, Skin: Dermis, and Fibrin/Exudate after achieving pain control using Other (benzocaine 20%). A time out was conducted at 16:10, prior to the start of the procedure. A Minimum amount of bleeding was controlled with Pressure. The procedure was tolerated well with a pain level of 0 throughout and a pain level of 0 following the procedure. Post Debridement Measurements: 14.5cm length x 7.5cm width x 3.1cm  depth; 264.777cm^3 volume. Character of Wound/Ulcer Post Debridement requires further debridement. Severity of Tissue Post Debridement is: Fat layer exposed. Post procedure Diagnosis Wound #8: Same as Pre-Procedure Plan Follow-up Appointments: Return Appointment in 2 weeks. - Dr. Heber [name] 06/09/2021 ***60 minutes extra time*** Nurse Visit: - Tuesday 06/02/2021 ***60 minutes extra time**** nurse to teach daughter Lenna Sciara how to apply wound vac at home. Bathing/ Shower/ Hygiene: Other Bathing/Shower/Hygiene Orders/Instructions: - You may sponge bathe Negative Presssure Wound Therapy: Wound #8 Left,Lateral Upper Leg: Wound Vac to wound continuously at 161m/hg pressure - change three times a week. Black Foam Off-Loading: Turn and reposition every 2 hours The following medication(s) was prescribed:  Dakin's Solution miscellaneous 0.25 % solution 1 wet to dry dressing once daily starting 05/26/2021 WOUND #6: - Upper Leg Wound Laterality: Right Cleanser: Wound Cleanser (Generic) 1 x Per Day/15 Days Discharge Instructions: Cleanse the wound with wound cleanser prior to applying a clean dressing using gauze sponges, not tissue or cotton balls. Prim Dressing: KerraCel Ag Gelling Fiber Dressing, 4x5 in (silver alginate) (Generic) 1 x Per Day/15 Days ary Discharge Instructions: Apply silver alginate to wound bed as instructed Secondary Dressing: Woven Gauze Sponge, Non-Sterile 4x4 in (Generic) 1 x Per Day/15 Days Discharge Instructions: Apply over primary dressing as directed. Secondary Dressing: ABD Pad, 5x9 (Generic) 1 x Per Day/15 Days Discharge Instructions: Apply over primary dressing as directed. Secured With: 90M Medipore H Soft Cloth Surgical T 4 x 2 (in/yd) (Generic) 1 x Per Day/15 Days ape Discharge Instructions: Secure dressing with tape as directed. WOUND #7: - Upper Leg Wound Laterality: Left, Medial Cleanser: Anasept Antimicrobial Skin and Wound Cleanser, 8 (oz) (Generic) 1 x Per Day/15 Days Discharge Instructions: Cleanse the wound with Anasept cleanser prior to applying a clean dressing using gauze sponges, not tissue or cotton balls. Prim Dressing: Santyl Ointment 1 x Per Day/15 Days ary Discharge Instructions: Apply nickel thick amount to wound bed as instructed Secondary Dressing: Woven Gauze Sponge, Non-Sterile 4x4 in (Generic) 1 x Per Day/15 Days Discharge Instructions: apply gauze over primary dressing as directed. Secondary Dressing: ABD Pad, 5x9 (Generic) 1 x Per Day/15 Days Discharge Instructions: Apply over primary dressing as directed. Secured With: 90M Medipore H Soft Cloth Surgical T 4 x 2 (in/yd) (Generic) 1 x Per Day/15 Days ape Discharge Instructions: Secure dressing with tape as directed. WOUND #8: - Upper Leg Wound Laterality: Left, Lateral Cleanser: Anasept  Antimicrobial Skin and Wound Cleanser, 8 (oz) (Generic) 1 x Per Day/15 Days Discharge Instructions: Cleanse the wound with Anasept cleanser prior to applying a clean dressing using gauze sponges, not tissue or cotton balls. Prim Dressing: dakin's solution 1 x Per Day/15 Days ary Discharge Instructions: moisten gauze with dakin's solution Secondary Dressing: Woven Gauze Sponge, Non-Sterile 4x4 in (Generic) 1 x Per Day/15 Days Discharge Instructions: Apply Apply saline moisten gauze backing the santyl over primary dressing as directed. Secondary Dressing: ABD Pad, 5x9 (Generic) 1 x Per Day/15 Days Discharge Instructions: Apply over primary dressing as directed. Secondary Dressing: Zetuvit Plus 4x8 in (Generic) 1 x Per Day/15 Days Discharge Instructions: Apply over primary dressing as directed. Secondary Dressing: CarboFLEX Odor Control Dressing, 4x4 in (Generic) 1 x Per Day/15 Days Discharge Instructions: Apply over primary dressing as directed. Secured With: 90M Medipore H Soft Cloth Surgical T 4 x 2 (in/yd) (Generic) 1 x Per Day/15 Days ape Discharge Instructions: Secure dressing with tape as directed. 1. In office sharp debridement 2. Dakin's wet-to-dry dressings to the left lateral wound 3. Silver  alginate to the right leg wound 4. Santyl to the left medial wound 5. Follow-up next week for nurse visit to place wound VAC 6. Follow-up with me in 2 weeks Electronic Signature(s) Signed: 05/26/2021 5:17:01 PM By: Kalman Shan DO Previous Signature: 05/26/2021 5:10:25 PM Version By: Kalman Shan DO Entered By: Kalman Shan on 05/26/2021 17:16:45 -------------------------------------------------------------------------------- HxROS Details Patient Name: Date of Service: [name], [name] 05/26/2021 2:30 PM Medical Record Number: [id number] Patient Account Number: [id number] Date of Birth/Sex: Treating RN: 11-Apr-1951 (70 y.o. [name], Tammi Klippel Primary Care Provider: [name] Other Clinician: Referring Provider: Treating Provider/Extender: Alysia Penna in Treatment: 3 Information Obtained From Patient Eyes Medical History: Positive for: Cataracts - lens implant done; Glaucoma Negative for: Optic Neuritis Ear/Nose/Mouth/Throat Medical History: Positive for: Chronic sinus problems/congestion - seasonal Negative for: Middle ear problems Hematologic/Lymphatic Medical History: Negative for: Anemia; Hemophilia; Human Immunodeficiency Virus; Lymphedema; Sickle Cell Disease Respiratory Medical History: Positive for: Asthma Negative for: Aspiration; Chronic Obstructive Pulmonary Disease (COPD); Pneumothorax; Sleep Apnea; Tuberculosis Cardiovascular Medical History: Positive for: Hypertension - not on meds presently Negative for: Angina; Arrhythmia; Congestive Heart Failure; Coronary Artery Disease; Deep Vein Thrombosis; Hypotension; Myocardial Infarction; Peripheral Arterial Disease; Peripheral Venous Disease; Phlebitis; Vasculitis Past Medical History Notes: Hyperlipidemia Gastrointestinal Medical History: Negative for: Cirrhosis ; Colitis; Crohns; Hepatitis A; Hepatitis B; Hepatitis C Past Medical History Notes: GERD, Diverticulitis Endocrine Medical History: Positive for: Type II Diabetes Negative for: Type I Diabetes Time with diabetes: 12 years Treated with: Insulin Blood sugar tested every day: Yes Tested : bid Blood sugar testing results: Breakfast: 140 max; Bedtime: 220's max Genitourinary Medical History: Negative for: End Stage Renal Disease Past Medical History Notes: Overactive Bladder Immunological Medical History: Negative for: Lupus Erythematosus; Raynauds; Scleroderma Integumentary (Skin) Medical History: Negative for: History of Burn Musculoskeletal Medical History: Positive for: Osteoarthritis Negative for: Gout; Rheumatoid Arthritis; Osteomyelitis Past Medical History  Notes: Fibromyalgia, toe amputations x 3 Neurologic Medical History: Positive for: Neuropathy Negative for: Dementia; Quadriplegia; Paraplegia; Seizure Disorder Oncologic Medical History: Negative for: Received Chemotherapy; Received Radiation Psychiatric Medical History: Negative for: Anorexia/bulimia; Confinement Anxiety Past Medical History Notes: Anxiety HBO Extended History Items Ear/Nose/Mouth/Throat: Eyes: Eyes: Chronic sinus Cataracts Glaucoma problems/congestion Immunizations Pneumococcal Vaccine: Received Pneumococcal Vaccination: Yes Immunization Notes: up to date on pneumonia and tetanus shots Implantable Devices None Family and Social History Cancer: Yes - Child; Diabetes: Yes - Father; Heart Disease: Yes - Mother,Father; Hereditary Spherocytosis: No; Hypertension: Yes - Mother,Father; Kidney Disease: No; Lung Disease: No; Seizures: No; Stroke: No; Thyroid Problems: No; Tuberculosis: No; Never smoker; Marital Status - Married; Alcohol Use: Never; Drug Use: No History; Caffeine Use: Daily - coffee , sodas; Financial Concerns: No; Food, Clothing or Shelter Needs: No; Support System Lacking: No; Transportation Concerns: No Electronic Signature(s) Signed: 05/26/2021 5:10:25 PM By: Kalman Shan DO Signed: 05/26/2021 6:09:20 PM By: Deon Pilling Entered By: Kalman Shan on 05/26/2021 17:04:31 -------------------------------------------------------------------------------- SuperBill Details Patient Name: Date of Service: [name], [name] 05/26/2021 Medical Record Number: [id number] Patient Account Number: [id number] Date of Birth/Sex: Treating RN: 30-Jun-1951 (70 y.o. 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 Facility Procedures CPT4 Code: [id number] Description: 84128 - DEB SUBQ TISSUE 20 SQ CM/< ICD-10 Diagnosis Description 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 Modifier: erity Quantity: 1 CPT4 Code: [id number] Description: 19597 - DEB SUBQ TISS EA ADDL 20CM ICD-10 Diagnosis Description 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 Modifier: erity Quantity: 5 Physician Procedures : CPT4 Code Description Modifier [id number] 11042 - WC PHYS SUBQ TISS 20 SQ CM ICD-10 Diagnosis Description 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ity Quantity: 1 : [id number] 74935 - WC PHYS SUBQ TISS EA ADDL 20 CM ICD-10 Diagnosis Description 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ity Quantity: 5 Electronic Signature(s) Signed: 05/26/2021 5:10:25 PM By: Kalman Shan DO Entered By: Kalman Shan on 05/26/2021 17:09:45

## 2021-05-26 NOTE — Progress Notes (Signed)
HAE, AHLERS ([id number]) Visit Report for 04/30/2021 Abuse/Suicide Risk Screen Details Patient Name: Date of Service: [name], [name] 04/30/2021 12:30 PM Medical Record Number: [id number] Patient Account Number: [id number] Date of Birth/Sex: Treating RN: 1951/07/03 (70 y.o. [name], Tammi Klippel Primary Care Provider: [name] Other Clinician: Referring Provider: Treating Provider/Extender: Alysia Penna in Treatment: 0 Abuse/Suicide Risk Screen Items Answer ABUSE RISK SCREEN: Has anyone close to you tried to hurt or harm you recentlyo No Do you feel uncomfortable with anyone in your familyo No Has anyone forced you do things that you didnt want to doo No Electronic Signature(s) Signed: 05/26/2021 9:42:43 PM By: Deon Pilling Previous Signature: 05/25/2021 9:17:54 PM Version By: Deon Pilling Entered By: Deon Pilling on 05/26/2021 21:39:43 -------------------------------------------------------------------------------- Activities of Daily Living Details Patient Name: Date of ServiceWarren [place], [name] 04/30/2021 12:30 PM Medical Record Number: [id number] Patient Account Number: [id number] Date of Birth/Sex: Treating RN: [birth year] (70 y.o. [name], Tammi Klippel Primary Care Provider: [name] Other Clinician: Referring Provider: Treating Provider/Extender: Alysia Penna in Treatment: 0 Activities of Daily Living Items Answer Activities of Daily Living (Please select one for each item) Drive Automobile Not Able T Medications ake Need Assistance Use T elephone Need Assistance Care for Appearance Need Assistance Use T oilet Need Assistance Bath / Shower Not Able Dress Self Not Able Feed Self Need Assistance Walk Not Able Get In / Out Bed Not Able Housework Not Able Prepare Meals Not Able Handle Money Not Able Shop for Self Not Able Electronic Signature(s) Signed: 05/26/2021 9:42:43 PM By: Deon Pilling Previous Signature: 05/25/2021 9:17:54 PM Version By: Deon Pilling Entered By: Deon Pilling on 05/26/2021 21:39:49 -------------------------------------------------------------------------------- Education Screening Details Patient Name: Date of Service: [name], [name] 04/30/2021 12:30 PM Medical Record Number: [id number] Patient Account Number: [id number] Date of Birth/Sex: Treating RN: May 01, 1951 (70 y.o. [name], Tammi Klippel Primary Care Provider: [name] Other Clinician: Referring Provider: Treating Provider/Extender: Alysia Penna in Treatment: 0 Primary Learner Assessed: Caregiver daughter Reason Patient is not Primary Learner: location of wounds Learning Preferences/Education Level/Primary Language Learning Preference: Explanation, Printed Material Highest Education Level: High School Preferred Language: English Cognitive Barrier Language Barrier: No Translator Needed: No Memory Deficit: No Emotional Barrier: No Cultural/Religious Beliefs Affecting Medical Care: No Physical Barrier Impaired Vision: No Impaired Hearing: No Decreased Hand dexterity: No Knowledge/Comprehension Knowledge Level: Medium Comprehension Level: Medium Ability to understand written instructions: High Ability to understand verbal instructions: High Motivation Anxiety Level: Calm Cooperation: Cooperative Education Importance: Acknowledges Need Interest in Health Problems: Asks Questions Perception: Coherent Willingness to Engage in Self-Management Medium Activities: Readiness to Engage in Self-Management Medium Activities: Electronic Signature(s) Signed: 05/26/2021 9:42:43 PM By: Deon Pilling Previous Signature: 05/25/2021 9:17:54 PM Version By: Deon Pilling Entered By: Deon Pilling on 05/26/2021 21:39:55 -------------------------------------------------------------------------------- Fall Risk Assessment Details Patient Name: Date of  Service: [name], [name] 04/30/2021 12:30 PM Medical Record Number: [id number] Patient Account Number: [id number] Date of Birth/Sex: Treating RN: 02/07/1951 (70 y.o. [name], Meta.Reding Primary Care Provider: [name] Other Clinician: Referring Provider: Treating Provider/Extender: Alysia Penna in Treatment: 0 Fall Risk Assessment Items Have you had 2 or more falls in the last 12 monthso 0 No Have you had any fall that resulted in injury in the last 12 monthso 0 No FALLS RISK SCREEN History of falling - immediate or within 3 months 0  No Secondary diagnosis (Do you have 2 or more medical diagnoseso) 0 No Ambulatory aid None/bed rest/wheelchair/nurse 0 Yes Crutches/cane/walker 0 No Furniture 0 No Intravenous therapy Access/Saline/Heparin Lock 0 No Gait/Transferring Normal/ bed rest/ wheelchair 0 Yes Weak (short steps with or without shuffle, stooped but able to lift head while walking, may seek 0 No support from furniture) Impaired (short steps with shuffle, may have difficulty arising from chair, head down, impaired 0 No balance) Mental Status Oriented to own ability 0 Yes Electronic Signature(s) Signed: 05/26/2021 9:42:43 PM By: Deon Pilling Previous Signature: 05/25/2021 9:17:54 PM Version By: Deon Pilling Entered By: Deon Pilling on 05/26/2021 21:40:02 -------------------------------------------------------------------------------- Foot Assessment Details Patient Name: Date of Service: [name], [name] 04/30/2021 12:30 PM Medical Record Number: [id number] Patient Account Number: [id number] Date of Birth/Sex: Treating RN: August 11, 1951 (70 y.o. [name] Primary Care Provider: [name] Other Clinician: Referring Provider: Treating Provider/Extender: Alysia Penna in Treatment: 0 Foot Assessment Items Site Locations + = Sensation present, - = Sensation absent, C = Callus, U = Ulcer R =  Redness, W = Warmth, M = Maceration, PU = Pre-ulcerative lesion F = Fissure, S = Swelling, D = Dryness Assessment Right: Left: Other Deformity: No No Prior Foot Ulcer: No No Prior Amputation: No No Charcot Joint: No No Ambulatory Status: Non-ambulatory Assistance Device: Stretcher Gait: Electronic Signature(s) Signed: 05/26/2021 9:42:43 PM By: Deon Pilling Previous Signature: 05/25/2021 9:17:54 PM Version By: Deon Pilling Entered By: Deon Pilling on 05/26/2021 21:40:14 -------------------------------------------------------------------------------- Nutrition Risk Screening Details Patient Name: Date of ServiceWarren Danes, [name] 04/30/2021 12:30 PM Medical Record Number: [id number] Patient Account Number: [id number] Date of Birth/Sex: Treating RN: June 21, 1951 (70 y.o. [name], Tammi Klippel Primary Care Provider: [name] Other Clinician: Referring Provider: Treating Provider/Extender: Alysia Penna in Treatment: 0 Height (in): 63 Weight (lbs): 279 Body Mass Index (BMI): 49.4 Nutrition Risk Screening Items Score Screening NUTRITION RISK SCREEN: I have an illness or condition that made me change the kind and/or amount of food I eat 2 Yes I eat fewer than two meals per day 0 No I eat few fruits and vegetables, or milk products 0 No I have three or more drinks of beer, liquor or wine almost every day 0 No I have tooth or mouth problems that make it hard for me to eat 2 Yes I don't always have enough money to buy the food I need 0 No I eat alone most of the time 0 No I take three or more different prescribed or over-the-counter drugs a day 1 Yes Without wanting to, I have lost or gained 10 pounds in the last six months 0 No I am not always physically able to shop, cook and/or feed myself 2 Yes Nutrition Protocols Good Risk Protocol Moderate Risk Protocol High Risk Proctocol 0 Provide education on nutrition Risk Level: High Risk Score:  7 Electronic Signature(s) Signed: 05/26/2021 9:42:43 PM By: Deon Pilling Previous Signature: 05/25/2021 9:17:54 PM Version By: Deon Pilling Entered By: Deon Pilling on 05/26/2021 21:40:08

## 2021-05-28 DIAGNOSIS — E119 Type 2 diabetes mellitus without complications: Secondary | ICD-10-CM | POA: Diagnosis not present

## 2021-05-28 DIAGNOSIS — S71102A Unspecified open wound, left thigh, initial encounter: Secondary | ICD-10-CM | POA: Diagnosis not present

## 2021-05-29 DIAGNOSIS — L97829 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ity: Secondary | ICD-10-CM | POA: Diagnosis not present

## 2021-05-29 DIAGNOSIS — E1169 Type 2 diabetes mellitus with other specified complication: Secondary | ICD-10-CM | POA: Diagnosis not present

## 2021-05-29 NOTE — Progress Notes (Signed)
JALANA, MOORE ([id number]) Visit Report for 04/30/2021 Chief Complaint Document Details Patient Name: Date of Service: Yesenia Payne, [name] 04/30/2021 12:30 PM Medical Record Number: [id number] Patient Account Number: [id number] Date of Birth/Sex: Treating RN: Aug 22, 1951 (70 y.o. [name], [name] Primary Care Provider: [name] Other Clinician: Referring Provider: Treating Provider/Extender: Alysia Penna in Treatment: 0 Information Obtained from: Patient Electronic Signature(s) Signed: 04/30/2021 4:53:42 PM By: Kalman Shan DO Entered By: Kalman Shan on 04/30/2021 13:38:30 -------------------------------------------------------------------------------- Debridement Details Patient Name: Date of Service: Yesenia [name], [name] 04/30/2021 12:30 PM Medical Record Number: [id number] Patient Account Number: [id number] Date of Birth/Sex: Treating RN: [birth year] (70 y.o. [name], [name] Primary Care Provider: [name] Other Clinician: Referring Provider: Treating Provider/Extender: Alysia Penna in Treatment: 0 Debridement Performed for Assessment: Wound #8 Left,Lateral Upper Leg Performed By: Physician Kalman Shan, DO Debridement Type: Debridement Level of Consciousness (Pre-procedure): Awake and Alert Pre-procedure Verification/Time Out Yes - 14:03 Taken: Start Time: 14:03 Pain Control: Lidocaine T Area Debrided (L x W): otal 18 (cm) x 8 (cm) = 144 (cm) Tissue and other material debrided: Viable, Non-Viable, Eschar, Slough, Slough Level: Non-Viable Tissue Debridement Description: Selective/Open Wound Instrument: Curette, Forceps, Scissors Bleeding: Minimum Hemostasis Achieved: Pressure End Time: 14:04 Procedural Pain: 0 Post Procedural Pain: 0 Response to Treatment: Procedure was tolerated well Level of Consciousness (Post- Awake and Alert procedure): Post Debridement Measurements of  Total Wound Length: (cm) 18 Width: (cm) 8 Depth: (cm) 2.5 Volume: (cm) 282.743 Character of Wound/Ulcer Post Debridement: Improved Post Procedure Diagnosis Same as Pre-procedure Electronic Signature(s) Signed: 04/30/2021 4:53:42 PM By: Kalman Shan DO Signed: 05/01/2021 6:18:32 PM By: Rhae Hammock RN Entered By: Rhae Hammock on 04/30/2021 14:08:44 -------------------------------------------------------------------------------- HPI Details Patient Name: Date of Service: Yesenia [name], [name] 04/30/2021 12:30 PM Medical Record Number: [id number] Patient Account Number: [id number] Date of Birth/Sex: Treating RN: Sep 17, 1951 (70 y.o. [name], [name] Primary Care Provider: [name] Other Clinician: Referring Provider: Treating Provider/Extender: Alysia Penna in Treatment: 0 History of Present Illness HPI Description: Admission 04/30/2021 Ms. 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 She has been seen in our clinic multiple times last seen in 03/2019 for right toe wounds. Today she presents for 3 wounds. 1 is located to the right upper leg and 2 to the left lower extremity. They started at the end of December And not sure how I started. She had not been using any dressings up until 2 weeks ago. She is currently using silver alginate to the right upper leg and Santyl to the wounds on the left leg. She has been on doxycycline and 2 rounds of clindamycin for cellulitis to these wounds. She finished her last round of antibiotics 3 weeks ago. She reports minimal pain to the wound. She currently denies signs of infection including increased erythema, warmth or purulent drainage. Electronic Signature(s) Signed: 04/30/2021 4:53:42 PM By: Kalman Shan DO Entered By: Kalman Shan on 04/30/2021  16:41:36 -------------------------------------------------------------------------------- Physical Exam Details Patient Name: Date of Service: Yesenia [name], [name] 04/30/2021 12:30 PM Medical Record Number: [id number] Patient Account Number: [id number] Date of Birth/Sex: Treating RN: 08-Aug-1951 (70 y.o. [name], [name] Primary Care Provider: [name] Other Clinician: Referring Provider: Treating Provider/Extender: Alysia Penna in Treatment: 0 Constitutional respirations regular, non-labored and  within target range for patient.Marland Kitchen Psychiatric pleasant and cooperative. Notes Right lower extremity: On the upper leg she has a open wound with granulation tissue present. This appears clean and healthy. Left lower extremity: On the medial aspect there are 2 wounds close to each other that is limited to skin breakdown with some nonviable tissue present. T the lateral side there is significant eschar and nonviable tissue. Fat is exposed. o No obvious signs of infection to any of the wounds Electronic Signature(s) Signed: 04/30/2021 4:53:42 PM By: Kalman Shan DO Entered By: Kalman Shan on 04/30/2021 16:43:20 -------------------------------------------------------------------------------- Physician Orders Details Patient Name: Date of Service: Yesenia [name], [name] 04/30/2021 12:30 PM Medical Record Number: [id number] Patient Account Number: [id number] Date of Birth/Sex: Treating RN: 09-22-1951 (70 y.o. [name], [name] Primary Care Provider: [name] Other Clinician: Referring Provider: Treating Provider/Extender: Alysia Penna in Treatment: 0 Verbal / Phone Orders: No Diagnosis Coding ICD-10 Coding Code Description [phone] 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ity L97.819 Non-pressure chronic ulcer of other part of right lower leg with unspecified severity E11.9  Type 2 diabetes mellitus without complications O67.672 Acquired absence of left leg below knee Follow-up Appointments ppointment in 2 weeks. - Dr. Heber Tulia Return A Bathing/ Shower/ Hygiene Other Bathing/Shower/Hygiene Orders/Instructions: - You may sponge bathe Off-Loading Turn and reposition every 2 hours Wound Treatment Wound #6 - Upper Leg Wound Laterality: Right Cleanser: Wound Cleanser (DME) (Generic) 1 x Per Day/15 Days Discharge Instructions: Cleanse the wound with wound cleanser prior to applying a clean dressing using gauze sponges, not tissue or cotton balls. Prim Dressing: KerraCel Ag Gelling Fiber Dressing, 4x5 in (silver alginate) (DME) (Generic) 1 x Per Day/15 Days ary Discharge Instructions: Apply silver alginate to wound bed as instructed Secondary Dressing: Woven Gauze Sponge, Non-Sterile 4x4 in (DME) (Generic) 1 x Per Day/15 Days Discharge Instructions: Apply over primary dressing as directed. Secondary Dressing: ABD Pad, 5x9 (DME) (Generic) 1 x Per Day/15 Days Discharge Instructions: Apply over primary dressing as directed. Secured With: 5106M Medipore H Soft Cloth Surgical T 4 x 2 (in/yd) (DME) (Generic) 1 x Per Day/15 Days ape Discharge Instructions: Secure dressing with tape as directed. Wound #7 - Upper Leg Wound Laterality: Left, Medial Cleanser: Anasept Antimicrobial Skin and Wound Cleanser, 8 (oz) (DME) (Generic) 1 x Per Day/15 Days Discharge Instructions: Cleanse the wound with Anasept cleanser prior to applying a clean dressing using gauze sponges, not tissue or cotton balls. Prim Dressing: Santyl Ointment 1 x Per Day/15 Days ary Discharge Instructions: Apply nickel thick amount to wound bed as instructed Secondary Dressing: Woven Gauze Sponge, Non-Sterile 4x4 in (DME) (Generic) 1 x Per Day/15 Days Discharge Instructions: Apply over primary dressing as directed. Secondary Dressing: ABD Pad, 5x9 (DME) (Generic) 1 x Per Day/15 Days Discharge Instructions: Apply  over primary dressing as directed. Secured With: 5106M Medipore H Soft Cloth Surgical T 4 x 2 (in/yd) (DME) (Generic) 1 x Per Day/15 Days ape Discharge Instructions: Secure dressing with tape as directed. Wound #8 - Upper Leg Wound Laterality: Left, Lateral Cleanser: Anasept Antimicrobial Skin and Wound Cleanser, 8 (oz) (DME) (Generic) 1 x Per Day/15 Days Discharge Instructions: Cleanse the wound with Anasept cleanser prior to applying a clean dressing using gauze sponges, not tissue or cotton balls. Prim Dressing: Santyl Ointment 1 x Per Day/15 Days ary Discharge Instructions: Apply nickel thick amount to wound bed as instructed Secondary Dressing: Woven Gauze Sponge, Non-Sterile 4x4 in (  DME) (Generic) 1 x Per Day/15 Days Discharge Instructions: Apply over primary dressing as directed. Secondary Dressing: ABD Pad, 5x9 (DME) (Generic) 1 x Per Day/15 Days Discharge Instructions: Apply over primary dressing as directed. Secondary Dressing: Zetuvit Plus 4x8 in (DME) (Generic) 1 x Per Day/15 Days Discharge Instructions: Apply over primary dressing as directed. Secondary Dressing: CarboFLEX Odor Control Dressing, 4x4 in (DME) (Generic) 1 x Per Day/15 Days Discharge Instructions: Apply over primary dressing as directed. Secured With: 69M Medipore H Soft Cloth Surgical T 4 x 2 (in/yd) (DME) (Generic) 1 x Per Day/15 Days ape Discharge Instructions: Secure dressing with tape as directed. Electronic Signature(s) Signed: 04/30/2021 4:53:42 PM By: Kalman Shan DO Entered By: Kalman Shan on 04/30/2021 16:45:10 -------------------------------------------------------------------------------- Problem List Details Patient Name: Date of Service: Yesenia [name], [name] 04/30/2021 12:30 PM Medical Record Number: [id number] Patient Account Number: [id number] Date of Birth/Sex: Treating RN: 03/17/1951 (70 y.o. [name], [name] Primary Care Provider: [name] Other Clinician: Referring  Provider: Treating Provider/Extender: Alysia Penna in Treatment: 0 Active Problems ICD-10 Encounter Code Description Active Date MDM Diagnosis L97.829 Non-pressure chronic ulcer of other part of left lower leg with unspecified 04/30/2021 No Yes severity L97.819 Non-pressure chronic ulcer of other part of right lower leg with unspecified 04/30/2021 No Yes severity E11.9 Type 2 diabetes mellitus without complications 9/56/2130 No Yes Z89.512 Acquired absence of left leg below knee 04/30/2021 No Yes Inactive Problems Resolved Problems Electronic Signature(s) Signed: 04/30/2021 4:53:42 PM By: Kalman Shan DO Entered By: Kalman Shan on 04/30/2021 16:44:45 -------------------------------------------------------------------------------- Progress Note Details Patient Name: Date of Service: Yesenia [name], [name] 04/30/2021 12:30 PM Medical Record Number: [id number] Patient Account Number: [id number] Date of Birth/Sex: Treating RN: [birth year] (70 y.o. [name], [name] Primary Care Provider: [name] Other Clinician: Referring Provider: Treating Provider/Extender: Alysia Penna in Treatment: 0 Subjective Chief Complaint Information obtained from Patient History of Present Illness (HPI) Admission 04/30/2021 Ms. 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 She has been seen in our clinic multiple times last seen in 03/2019 for right toe wounds. Today she presents for 3 wounds. 1 is located to the right upper leg and 2 to the left lower extremity. They started at the end of December And not sure how I started. She had not been using any dressings up until 2 weeks ago. She is currently using silver alginate to the right upper leg and Santyl to the wounds on the left leg. She has been on  doxycycline and 2 rounds of clindamycin for cellulitis to these wounds. She finished her last round of antibiotics 3 weeks ago. She reports minimal pain to the wound. She currently denies signs of infection including increased erythema, warmth or purulent drainage. Patient History Information obtained from Patient. Allergies Iodinated Contrast- Oral and IV Dye (Severity: Severe) Family History Cancer - Child, Diabetes - Father, Heart Disease - Mother,Father, Hypertension - Mother,Father, No family history of Hereditary Spherocytosis, Kidney Disease, Lung Disease, Seizures, Stroke, Thyroid Problems, Tuberculosis. Social History Never smoker, Marital Status - Married, Alcohol Use - Never, Drug Use - No History, Caffeine Use - Daily - coffee , sodas. Medical History Eyes Patient has history of Cataracts - lens implant done, Glaucoma Denies history of Optic Neuritis Ear/Nose/Mouth/Throat Patient has history of Chronic sinus problems/congestion - seasonal Denies history of Middle ear problems Hematologic/Lymphatic  Denies history of Anemia, Hemophilia, Human Immunodeficiency Virus, Lymphedema, Sickle Cell Disease Respiratory Patient has history of Asthma Denies history of Aspiration, Chronic Obstructive Pulmonary Disease (COPD), Pneumothorax, Sleep Apnea, Tuberculosis Cardiovascular Patient has history of Hypertension - not on meds presently Denies history of Angina, Arrhythmia, Congestive Heart Failure, Coronary Artery Disease, Deep Vein Thrombosis, Hypotension, Myocardial Infarction, Peripheral Arterial Disease, Peripheral Venous Disease, Phlebitis, Vasculitis Gastrointestinal Denies history of Cirrhosis , Colitis, Crohnoos, Hepatitis A, Hepatitis B, Hepatitis C Endocrine Patient has history of Type II Diabetes Denies history of Type I Diabetes Genitourinary Denies history of End Stage Renal Disease Immunological Denies history of Lupus Erythematosus, Raynaudoos,  Scleroderma Integumentary (Skin) Denies history of History of Burn Musculoskeletal Patient has history of Osteoarthritis Denies history of Gout, Rheumatoid Arthritis, Osteomyelitis Neurologic Patient has history of Neuropathy Denies history of Dementia, Quadriplegia, Paraplegia, Seizure Disorder Oncologic Denies history of Received Chemotherapy, Received Radiation Psychiatric Denies history of Anorexia/bulimia, Confinement Anxiety Patient is treated with Insulin. Blood sugar is tested. Blood sugar results noted at the following times: Breakfast - 140 max, Bedtime - 220's max. Medical A Surgical History Notes nd Cardiovascular Hyperlipidemia Gastrointestinal GERD, Diverticulitis Genitourinary Overactive Bladder Musculoskeletal Fibromyalgia, toe amputations x 3 Psychiatric Anxiety Review of Systems (ROS) Constitutional Symptoms (General Health) Denies complaints or symptoms of Fatigue, Fever, Chills, Marked Weight Change. Eyes Complains or has symptoms of Vision Changes - reading glasses. Respiratory Complains or has symptoms of Shortness of Breath - with exertion. Denies complaints or symptoms of Chronic or frequent coughs. Gastrointestinal Denies complaints or symptoms of Frequent diarrhea, Nausea, Vomiting. Genitourinary Denies complaints or symptoms of Frequent urination, frequent UTI's Integumentary (Skin) Denies complaints or symptoms of Wounds. Musculoskeletal Complains or has symptoms of Muscle Pain - chronic pain, fibromyalgia, Muscle Weakness. Neurologic Complains or has symptoms of Numbness/parasthesias - feet completely numb. Psychiatric Complains or has symptoms of Claustrophobia - elevators , smaller rooms. Denies complaints or symptoms of Suicidal. Objective Constitutional respirations regular, non-labored and within target range for patient.. Vitals Time Taken: 12:57 PM, Height: 63 in, Source: Stated, Weight: 279 lbs, Source: Stated, BMI: 49.4,  Temperature: 98.2 F, Pulse: 68 bpm, Respiratory Rate: 18 breaths/min, Blood Pressure: 150/80 mmHg, Pulse Oximetry: 97 %. Psychiatric pleasant and cooperative. General Notes: Right lower extremity: On the upper leg she has a open wound with granulation tissue present. This appears clean and healthy. Left lower extremity: On the medial aspect there are 2 wounds close to each other that is limited to skin breakdown with some nonviable tissue present. T the lateral side o there is significant eschar and nonviable tissue. Fat is exposed. No obvious signs of infection to any of the wounds Integumentary (Hair, Skin) Wound #6 status is Open. Original cause of wound was Gradually Appeared. The date acquired was: 11/06/2020. The wound is located on the Right Upper Leg. The wound measures 3cm length x 12cm width x 1cm depth; 28.274cm^2 area and 28.274cm^3 volume. There is Fat Layer (Subcutaneous Tissue) exposed. There is no tunneling noted, however, there is undermining starting at 12:00 and ending at 3:00 with a maximum distance of 0.6cm. There is a medium amount of serosanguineous drainage noted. The wound margin is flat and intact. There is no granulation within the wound bed. There is no necrotic tissue within the wound bed. Wound #7 status is Open. Original cause of wound was Gradually Appeared. The date acquired was: 01/06/2021. The wound is located on the Left,Medial Upper Leg. The wound measures 9cm length x 6cm width x 0.2cm depth;  42.412cm^2 area and 8.482cm^3 volume. There is Fat Layer (Subcutaneous Tissue) exposed. There is no tunneling or undermining noted. There is a medium amount of serosanguineous drainage noted. The wound margin is flat and intact. There is small (1-33%) red granulation within the wound bed. There is a medium (34-66%) amount of necrotic tissue within the wound bed including Adherent Slough. Wound #8 status is Open. Original cause of wound was Gradually Appeared. The date  acquired was: 01/06/2021. The wound is located on the Left,Lateral Upper Leg. The wound measures 18cm length x 8cm width x 2.5cm depth; 113.097cm^2 area and 282.743cm^3 volume. There is muscle and Fat Layer (Subcutaneous Tissue) exposed. There is no tunneling noted, however, there is undermining starting at 12:00 and ending at 12:00 with a maximum distance of 1cm. There is a large amount of serosanguineous drainage noted. Foul odor after cleansing was noted. The wound margin is thickened. There is small (1-33%) red granulation within the wound bed. There is a large (67-100%) amount of necrotic tissue within the wound bed including Eschar and Adherent Slough. Assessment Active Problems ICD-10 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ity Non-pressure chronic ulcer of other part of right lower leg with unspecified severity Type 2 diabetes mellitus without complications Acquired absence of left leg below knee Patient presents with a 68-monthhistory of nonhealing chronic wounds to the lower extremities bilaterally. The wound to the right upper leg appears to have developed from increased moisture since it is in the skin fold. I recommended continuing silver alginate and adding baby powder to keep the area dry. For the left medial wound there is some nonviable tissue and I recommended continuing with Santyl to this area. This wound looks like it developed from friction as its in the inner thigh. I recommended keeping this area from receiving any pressure or friction. The left lateral wound is more extensive than the other wounds. This appears to of resulted from a pressure injury. She uses a wheelchair and this is likely how it started. It has eschar and nonviable tissue and fat exposed. She reports not allowing pressure to this area since the wound has opened. I debrided some of the area today and crosshatched the eschar. I recommended continuing Santyl. There were  no obvious signs of infection on exam and I do not recommend antibiotics at this time. 45 minutes was spent in the encounter including face-to-face and review of EMR Procedures Wound #8 Pre-procedure diagnosis of Wound #8 is a T be determined located on the Left,Lateral Upper Leg . There was a Selective/Open Wound Non-Viable Tissue o Debridement with a total area of 144 sq cm performed by HKalman Shan DO. With the following instrument(s): Curette, Forceps, and Scissors to remove Viable and Non-Viable tissue/material. Material removed includes Eschar and Slough and after achieving pain control using Lidocaine. No specimens were taken. A time out was conducted at 14:03, prior to the start of the procedure. A Minimum amount of bleeding was controlled with Pressure. The procedure was tolerated well with a pain level of 0 throughout and a pain level of 0 following the procedure. Post Debridement Measurements: 18cm length x 8cm width x 2.5cm depth; 282.743cm^3 volume. Character of Wound/Ulcer Post Debridement is improved. Post procedure Diagnosis Wound #8: Same as Pre-Procedure Plan Follow-up Appointments: Return Appointment in 2 weeks. - Dr. HHeber CarolinaBathing/ Shower/ Hygiene: Other Bathing/Shower/Hygiene Orders/Instructions: - You may sponge bathe Off-Loading: Turn and reposition every 2 hours WOUND #6: - Upper Leg Wound  Laterality: Right Cleanser: Wound Cleanser (DME) (Generic) 1 x Per Day/15 Days Discharge Instructions: Cleanse the wound with wound cleanser prior to applying a clean dressing using gauze sponges, not tissue or cotton balls. Prim Dressing: KerraCel Ag Gelling Fiber Dressing, 4x5 in (silver alginate) (DME) (Generic) 1 x Per Day/15 Days ary Discharge Instructions: Apply silver alginate to wound bed as instructed Secondary Dressing: Woven Gauze Sponge, Non-Sterile 4x4 in (DME) (Generic) 1 x Per Day/15 Days Discharge Instructions: Apply over primary dressing as  directed. Secondary Dressing: ABD Pad, 5x9 (DME) (Generic) 1 x Per Day/15 Days Discharge Instructions: Apply over primary dressing as directed. Secured With: 75M Medipore H Soft Cloth Surgical T 4 x 2 (in/yd) (DME) (Generic) 1 x Per Day/15 Days ape Discharge Instructions: Secure dressing with tape as directed. WOUND #7: - Upper Leg Wound Laterality: Left, Medial Cleanser: Anasept Antimicrobial Skin and Wound Cleanser, 8 (oz) (DME) (Generic) 1 x Per Day/15 Days Discharge Instructions: Cleanse the wound with Anasept cleanser prior to applying a clean dressing using gauze sponges, not tissue or cotton balls. Prim Dressing: Santyl Ointment 1 x Per Day/15 Days ary Discharge Instructions: Apply nickel thick amount to wound bed as instructed Secondary Dressing: Woven Gauze Sponge, Non-Sterile 4x4 in (DME) (Generic) 1 x Per Day/15 Days Discharge Instructions: Apply over primary dressing as directed. Secondary Dressing: ABD Pad, 5x9 (DME) (Generic) 1 x Per Day/15 Days Discharge Instructions: Apply over primary dressing as directed. Secured With: 75M Medipore H Soft Cloth Surgical T 4 x 2 (in/yd) (DME) (Generic) 1 x Per Day/15 Days ape Discharge Instructions: Secure dressing with tape as directed. WOUND #8: - Upper Leg Wound Laterality: Left, Lateral Cleanser: Anasept Antimicrobial Skin and Wound Cleanser, 8 (oz) (DME) (Generic) 1 x Per Day/15 Days Discharge Instructions: Cleanse the wound with Anasept cleanser prior to applying a clean dressing using gauze sponges, not tissue or cotton balls. Prim Dressing: Santyl Ointment 1 x Per Day/15 Days ary Discharge Instructions: Apply nickel thick amount to wound bed as instructed Secondary Dressing: Woven Gauze Sponge, Non-Sterile 4x4 in (DME) (Generic) 1 x Per Day/15 Days Discharge Instructions: Apply over primary dressing as directed. Secondary Dressing: ABD Pad, 5x9 (DME) (Generic) 1 x Per Day/15 Days Discharge Instructions: Apply over primary dressing  as directed. Secondary Dressing: Zetuvit Plus 4x8 in (DME) (Generic) 1 x Per Day/15 Days Discharge Instructions: Apply over primary dressing as directed. Secondary Dressing: CarboFLEX Odor Control Dressing, 4x4 in (DME) (Generic) 1 x Per Day/15 Days Discharge Instructions: Apply over primary dressing as directed. Secured With: 75M Medipore H Soft Cloth Surgical T 4 x 2 (in/yd) (DME) (Generic) 1 x Per Day/15 Days ape Discharge Instructions: Secure dressing with tape as directed. 1. Silver alginate to the right leg wound and 2. Santyl to the left leg wound 3. In office sharp debridement 4. Follow-up in 2 weeks Electronic Signature(s) Signed: 04/30/2021 4:53:42 PM By: Kalman Shan DO Entered By: Kalman Shan on 04/30/2021 16:53:07 -------------------------------------------------------------------------------- HxROS Details Patient Name: Date of Service: Yesenia [name], [name] 04/30/2021 12:30 PM Medical Record Number: [id number] Patient Account Number: [id number] Date of Birth/Sex: Treating RN: [birth year] (70 y.o. [name] Primary Care Provider: [name] Other Clinician: Referring Provider: Treating Provider/Extender: Alysia Penna in Treatment: 0 Information Obtained From Patient Constitutional Symptoms (General Health) Complaints and Symptoms: Negative for: Fatigue; Fever; Chills; Marked Weight Change Eyes Complaints and Symptoms: Positive for: Vision Changes - reading glasses Medical History: Positive for: Cataracts - lens implant done; Glaucoma  Negative for: Optic Neuritis Respiratory Complaints and Symptoms: Positive for: Shortness of Breath - with exertion Negative for: Chronic or frequent coughs Medical History: Positive for: Asthma Negative for: Aspiration; Chronic Obstructive Pulmonary Disease (COPD); Pneumothorax; Sleep Apnea; Tuberculosis Gastrointestinal Complaints and Symptoms: Negative for: Frequent diarrhea;  Nausea; Vomiting Medical History: Negative for: Cirrhosis ; Colitis; Crohns; Hepatitis A; Hepatitis B; Hepatitis C Past Medical History Notes: GERD, Diverticulitis Genitourinary Complaints and Symptoms: Negative for: Frequent urination Review of System Notes: frequent UTI's Medical History: Negative for: End Stage Renal Disease Past Medical History Notes: Overactive Bladder Integumentary (Skin) Complaints and Symptoms: Negative for: Wounds Medical History: Negative for: History of Burn Musculoskeletal Complaints and Symptoms: Positive for: Muscle Pain - chronic pain, fibromyalgia; Muscle Weakness Medical History: Positive for: Osteoarthritis Negative for: Gout; Rheumatoid Arthritis; Osteomyelitis Past Medical History Notes: Fibromyalgia, toe amputations x 3 Neurologic Complaints and Symptoms: Positive for: Numbness/parasthesias - feet completely numb Medical History: Positive for: Neuropathy Negative for: Dementia; Quadriplegia; Paraplegia; Seizure Disorder Psychiatric Complaints and Symptoms: Positive for: Claustrophobia - elevators , smaller rooms Negative for: Suicidal Medical History: Negative for: Anorexia/bulimia; Confinement Anxiety Past Medical History Notes: Anxiety Ear/Nose/Mouth/Throat Medical History: Positive for: Chronic sinus problems/congestion - seasonal Negative for: Middle ear problems Hematologic/Lymphatic Medical History: Negative for: Anemia; Hemophilia; Human Immunodeficiency Virus; Lymphedema; Sickle Cell Disease Cardiovascular Medical History: Positive for: Hypertension - not on meds presently Negative for: Angina; Arrhythmia; Congestive Heart Failure; Coronary Artery Disease; Deep Vein Thrombosis; Hypotension; Myocardial Infarction; Peripheral Arterial Disease; Peripheral Venous Disease; Phlebitis; Vasculitis Past Medical History Notes: Hyperlipidemia Endocrine Medical History: Positive for: Type II Diabetes Negative for: Type I  Diabetes Time with diabetes: 12 years Treated with: Insulin Blood sugar tested every day: Yes T ested : bid Blood sugar testing results: Breakfast: 140 max; Bedtime: 220's max Immunological Medical History: Negative for: Lupus Erythematosus; Raynauds; Scleroderma Oncologic Medical History: Negative for: Received Chemotherapy; Received Radiation HBO Extended History Items Ear/Nose/Mouth/Throat: Eyes: Eyes: Chronic sinus Cataracts Glaucoma problems/congestion Immunizations Pneumococcal Vaccine: Received Pneumococcal Vaccination: Yes Immunization Notes: up to date on pneumonia and tetanus shots Implantable Devices None Family and Social History Cancer: Yes - Child; Diabetes: Yes - Father; Heart Disease: Yes - Mother,Father; Hereditary Spherocytosis: No; Hypertension: Yes - Mother,Father; Kidney Disease: No; Lung Disease: No; Seizures: No; Stroke: No; Thyroid Problems: No; Tuberculosis: No; Never smoker; Marital Status - Married; Alcohol Use: Never; Drug Use: No History; Caffeine Use: Daily - coffee , sodas; Financial Concerns: No; Food, Clothing or Shelter Needs: No; Support System Lacking: No; Transportation Concerns: No Electronic Signature(s) Signed: 05/26/2021 9:42:43 PM By: Deon Pilling Signed: 05/29/2021 2:46:51 PM By: Kalman Shan DO Previous Signature: 04/30/2021 4:53:42 PM Version By: Kalman Shan DO Previous Signature: 05/25/2021 9:17:54 PM Version By: Deon Pilling Entered By: Deon Pilling on 05/26/2021 21:39:21 -------------------------------------------------------------------------------- SuperBill Details Patient Name: Date of Service: Yesenia [name], [name] 04/30/2021 Medical Record Number: [id number] Patient Account Number: [id number] Date of Birth/Sex: Treating RN: September 19, 1951 (70 y.o. 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 Facility Procedures CPT4 Code: 95621308 Description: 65784 - WOUND CARE VISIT-LEV 5 EST PT Modifier: Quantity: 1 CPT4 Code: 69629528  Description: [phone] - DEBRIDE WOUND 1ST 20 SQ CM OR < ICD-10 Diagnosis Description 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 Modifier: ity Quantity: 1 CPT4 Code: [id number] Description: 46568 - DEBRIDE WOUND EA ADDL 20 SQ CM ICD-10 Diagnosis Description 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 Modifier: ity Quantity: 7 Physician Procedures : CPT4 Code Description Modifier [id number] 99214 - WC PHYS LEVEL 4 - EST PT ICD-10 Diagnosis Description 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ity L97.819 Non-pressure chronic ulcer of other part of right  lower leg with unspecified severity E11.9 Type 2 diabetes mellitus without complications Y17.494 Acquired absence of left leg below knee Quantity: 1 : [id number] 63846 - WC PHYS DEBR WO ANESTH 20 SQ CM ICD-10 Diagnosis Description 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ity Quantity: 1 Electronic Signature(s) Signed: 05/01/2021 6:18:32 PM By: Rhae Hammock RN Signed: 05/04/2021 9:31:32 AM By: Kalman Shan DO Previous Signature: 04/30/2021 4:53:42 PM Version By: Kalman Shan DO Entered By: Rhae Hammock on 05/01/2021 18:00:52

## 2021-06-02 ENCOUNTER — Other Ambulatory Visit: Payer: Self-pay

## 2021-06-02 ENCOUNTER — Encounter (HOSPITAL_BASED_OUTPATIENT_CLINIC_OR_DEPARTMENT_OTHER): Payer: Medicare Other | Admitting: Internal Medicine

## 2021-06-02 DIAGNOSIS — Z743 Need for continuous supervision: Secondary | ICD-10-CM | POA: Diagnosis not present

## 2021-06-02 DIAGNOSIS — E11622 Type 2 diabetes mellitus with other skin ulcer: Secondary | ICD-10-CM | POA: Diagnosis not present

## 2021-06-02 DIAGNOSIS — R531 Weakness: Secondary | ICD-10-CM | POA: Diagnosis not present

## 2021-06-02 DIAGNOSIS — Z89512 Acquired absence of left leg below knee: Secondary | ICD-10-CM | POA: Diagnosis not present

## 2021-06-02 DIAGNOSIS — Z833 Family history of diabetes mellitus: Secondary | ICD-10-CM | POA: Diagnosis not present

## 2021-06-02 DIAGNOSIS — M961 Postlaminectomy syndrome, not elsewhere classified: Secondary | ICD-10-CM | POA: Diagnosis not present

## 2021-06-02 DIAGNOSIS — E1142 Type 2 diabetes mellitus with diabetic polyneuropathy: Secondary | ICD-10-CM | POA: Diagnosis not present

## 2021-06-02 DIAGNOSIS — R5381 Other malaise: Secondary | ICD-10-CM | POA: Diagnosis not present

## 2021-06-02 DIAGNOSIS — L97819 Non-pressure chronic ulcer of other part of right lower leg with unspecified severity: Secondary | ICD-10-CM | POA: Diagnosis not present

## 2021-06-02 DIAGNOSIS — G894 Chronic pain syndrome: Secondary | ICD-10-CM | POA: Diagnosis not present

## 2021-06-02 DIAGNOSIS — Z7401 Bed confinement status: Secondary | ICD-10-CM | POA: Diagnosis not present

## 2021-06-02 DIAGNOSIS — L97829 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ity: Secondary | ICD-10-CM | POA: Diagnosis not present

## 2021-06-02 NOTE — Progress Notes (Signed)
ABY, GESSEL ([id number]) Visit Report for 06/02/2021 Arrival Information Details Patient Name: Date of Service: [name], [name] 06/02/2021 3:30 PM Medical Record Number: [id number] Patient Account Number: [id number] Date of Birth/Sex: Treating RN: [birth year] (70 y.o. [name], Tammi Klippel Primary Care Karolynn Infantino: [name] Other Clinician: Referring Nerida Boivin: Treating Mickayla Trouten/Extender: Nida Boatman in Treatment: 4 Visit Information History Since Last Visit Added or deleted any medications: No Patient Arrived: Stretcher Any new allergies or adverse reactions: No Arrival Time: 16:00 Had a fall or experienced change in No Accompanied By: self activities of daily living that may affect Transfer Assistance: Stretcher risk of falls: Patient Identification Verified: Yes Signs or symptoms of abuse/neglect since last visito No Secondary Verification Process Completed: Yes Hospitalized since last visit: No Patient Requires Transmission-Based Precautions: No Implantable device outside of the clinic excluding No Patient Has Alerts: No cellular tissue based products placed in the center since last visit: Has Dressing in Place as Prescribed: Yes Pain Present Now: No Electronic Signature(s) Signed: 06/02/2021 5:46:34 PM By: Deon Pilling Entered By: Deon Pilling on 06/02/2021 17:26:09 -------------------------------------------------------------------------------- Encounter Discharge Information Details Patient Name: Date of Service: [name], [name] 06/02/2021 3:30 PM Medical Record Number: [id number] Patient Account Number: [id number] Date of Birth/Sex: Treating RN: 1950/12/21 (70 y.o. [name] Primary Care Danity Schmelzer: [name] Other Clinician: Referring Aurie Harroun: Treating Jerico Grisso/Extender: Nida Boatman in Treatment: 4 Encounter Discharge Information Items Discharge Condition: Stable Ambulatory Status:  Stretcher Discharge Destination: Home Transportation: Private Auto Accompanied By: daughter Schedule Follow-up Appointment: Yes Clinical Summary of Care: Electronic Signature(s) Signed: 06/02/2021 5:46:34 PM By: Deon Pilling Entered By: Deon Pilling on 06/02/2021 17:31:47 -------------------------------------------------------------------------------- Negative Pressure Wound Therapy Application (NPWT) Details Patient Name: Date of Service: [name], [name] 06/02/2021 3:30 PM Medical Record Number: [id number] Patient Account Number: [id number] Date of Birth/Sex: Treating RN: 02/13/1951 (70 y.o. [name], Tammi Klippel Primary Care Supriya Beaston: [name] Other Clinician: Referring Mahagony Grieb: Treating Landon Bassford/Extender: Nida Boatman in Treatment: 4 NPWT Application Performed for: Wound #8 Left, Lateral Upper Leg Performed By: Deon Pilling, RN Type: VAC System Coverage Size (sq cm): 108.75 Pressure Type: Constant Pressure Setting: 125 mmHG Drain Type: None Primary Contact: Non-Adherent Quantity of Sponges/Gauze Inserted: x1 black foam to wound bed and bridge. Sponge/Dressing Type: Foam, Black Date Initiated: 06/02/2021 Response to Treatment: tolerated well. Electronic Signature(s) Signed: 06/02/2021 5:46:34 PM By: Deon Pilling Entered By: Deon Pilling on 06/02/2021 17:29:04 -------------------------------------------------------------------------------- Wound Assessment Details Patient Name: Date of Service: [name], [name] 06/02/2021 3:30 PM Medical Record Number: [id number] Patient Account Number: [id number] Date of Birth/Sex: Treating RN: Jul 24, 1951 (70 y.o. [name], Meta.Reding Primary Care Golda Zavalza: [name] Other Clinician: Referring Nyasha Rahilly: Treating Eyvonne Burchfield/Extender: Nida Boatman in Treatment: 4 Wound Status Wound Number: 8 Primary Diabetic Wound/Ulcer of the Lower Extremity Etiology: Wound Location:  Left, Lateral Upper Leg Wound Open Wounding Event: Gradually Appeared Status: Date Acquired: 01/06/2021 Comorbid Cataracts, Glaucoma, Chronic sinus problems/congestion, Asthma, Weeks Of Treatment: 4 History: Hypertension, Type II Diabetes, Osteoarthritis, Neuropathy Clustered Wound: No Wound Measurements Length: (cm) 14.5 Width: (cm) 7.5 Depth: (cm) 3.1 Area: (cm) 85.412 Volume: (cm) 264.777 % Reduction in Area: 24.5% % Reduction in Volume: 6.4% Epithelialization: None Tunneling: No Undermining: No Wound Description Classification: Grade 2 Wound Margin: Thickened Exudate Amount: Large Exudate Type: Purulent Exudate Color: yellow, brown, green Foul Odor After Cleansing: Yes Due to Product Use: No  Slough/Fibrino Yes Wound Bed Granulation Amount: Large (67-100%) Exposed Structure Granulation Quality: Red Fascia Exposed: No Necrotic Amount: Small (1-33%) Fat Layer (Subcutaneous Tissue) Exposed: Yes Necrotic Quality: Adherent Slough Tendon Exposed: No Muscle Exposed: Yes Necrosis of Muscle: No Joint Exposed: No Bone Exposed: No Treatment Notes Wound #8 (Upper Leg) Wound Laterality: Left, Lateral Cleanser Peri-Wound Care Topical Primary Dressing Secondary Dressing Secured With Compression Wrap Compression Stockings Add-Ons Notes wound vac applied to left lateral wound. x1 one black foam with bridge to lateral part of wound. Educated and demonstrated to daughter Lenna Sciara how to apply wound vac. Daughter in agreement. Electronic Signature(s) Signed: 06/02/2021 5:46:34 PM By: Deon Pilling Entered By: Deon Pilling on 06/02/2021 17:26:46 -------------------------------------------------------------------------------- Vitals Details Patient Name: Date of Service: [name], [name] 06/02/2021 3:30 PM Medical Record Number: [id number] Patient Account Number: [id number] Date of Birth/Sex: Treating RN: [birth year] (70 y.o. [name], Meta.Reding Primary Care Dillan Candela:  [name] Other Clinician: Referring Kyrah Schiro: Treating Sheyla Zaffino/Extender: Nida Boatman in Treatment: 4 Vital Signs Time Taken: 16:00 Temperature (F): 97.9 Height (in): 63 Pulse (bpm): 83 Weight (lbs): 279 Respiratory Rate (breaths/min): 20 Body Mass Index (BMI): 49.4 Blood Pressure (mmHg): 156/84 Reference Range: 80 - 120 mg / dl Electronic Signature(s) Signed: 06/02/2021 5:46:34 PM By: Deon Pilling Entered By: Deon Pilling on 06/02/2021 93:73:42

## 2021-06-02 NOTE — Progress Notes (Signed)
TANAZIA, ACHEE ([id number]) Visit Report for 06/02/2021 SuperBill Details Patient Name: Date of Service: [name], [place] [facility] RET D. 06/02/2021 Medical Record Number: [id number] Patient Account Number: [id number] Date of Birth/Sex: Treating RN: [birth year] (70 y.o. [name], Meta.Reding Primary Care Provider: [name] Other Clinician: Referring Provider: Treating Provider/Extender: Nida Boatman in Treatment: 4 Diagnosis Coding ICD-10 Codes Code Description [phone] 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ity L97.819 Non-pressure chronic ulcer of other part of right lower leg with unspecified severity E11.9 Type 2 diabetes mellitus without complications Y04.599 Acquired absence of left leg below knee Facility Procedures CPT4 Code Description Modifier Quantity [id number] - WOUND VAC-GREATER TH 50 SQ CM 1 Electronic Signature(s) Signed: 06/02/2021 5:43:33 PM By: Linton Ham MD Signed: 06/02/2021 5:46:34 PM By: Deon Pilling Entered By: Deon Pilling on 06/02/2021 17:31:51

## 2021-06-06 DIAGNOSIS — L8990 Pressure ulcer of unspecified site, unspecified stage: Secondary | ICD-10-CM | POA: Diagnosis not present

## 2021-06-06 DIAGNOSIS — L02612 Cutaneous abscess of left foot: Secondary | ICD-10-CM | POA: Diagnosis not present

## 2021-06-08 DIAGNOSIS — E1169 Type 2 diabetes mellitus with other specified complication: Secondary | ICD-10-CM | POA: Diagnosis not present

## 2021-06-08 DIAGNOSIS — L97829 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ity: Secondary | ICD-10-CM | POA: Diagnosis not present

## 2021-06-11 ENCOUNTER — Encounter (HOSPITAL_BASED_OUTPATIENT_CLINIC_OR_DEPARTMENT_OTHER): Payer: Medicare Other | Attending: Internal Medicine | Admitting: Internal Medicine

## 2021-06-11 ENCOUNTER — Other Ambulatory Visit: Payer: Self-pay

## 2021-06-11 DIAGNOSIS — Z7401 Bed confinement status: Secondary | ICD-10-CM | POA: Diagnosis not present

## 2021-06-11 DIAGNOSIS — Z833 Family history of diabetes mellitus: Secondary | ICD-10-CM | POA: Diagnosis not present

## 2021-06-11 DIAGNOSIS — E11622 Type 2 diabetes mellitus with other skin ulcer: Secondary | ICD-10-CM | POA: Insufficient documentation

## 2021-06-11 DIAGNOSIS — L97919 Non-pressure chronic ulcer of unspecified part of right lower leg with unspecified severity: Secondary | ICD-10-CM | POA: Insufficient documentation

## 2021-06-11 DIAGNOSIS — Z743 Need for continuous supervision: Secondary | ICD-10-CM | POA: Diagnosis not present

## 2021-06-11 DIAGNOSIS — Z794 Long term (current) use of insulin: Secondary | ICD-10-CM | POA: Diagnosis not present

## 2021-06-11 DIAGNOSIS — E1142 Type 2 diabetes mellitus with diabetic polyneuropathy: Secondary | ICD-10-CM | POA: Insufficient documentation

## 2021-06-11 DIAGNOSIS — L97929 Non-pressure chronic ulcer of unspecified part of left lower leg with unspecified severity: Secondary | ICD-10-CM | POA: Insufficient documentation

## 2021-06-11 DIAGNOSIS — Z89512 Acquired absence of left leg below knee: Secondary | ICD-10-CM | POA: Diagnosis not present

## 2021-06-11 DIAGNOSIS — R739 Hyperglycemia, unspecified: Secondary | ICD-10-CM | POA: Diagnosis not present

## 2021-06-11 DIAGNOSIS — L8990 Pressure ulcer of unspecified site, unspecified stage: Secondary | ICD-10-CM | POA: Diagnosis not present

## 2021-06-11 DIAGNOSIS — E1151 Type 2 diabetes mellitus with diabetic peripheral angiopathy without gangrene: Secondary | ICD-10-CM | POA: Diagnosis not present

## 2021-06-11 DIAGNOSIS — L97829 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ity: Secondary | ICD-10-CM | POA: Diagnosis not present

## 2021-06-11 DIAGNOSIS — L02612 Cutaneous abscess of left foot: Secondary | ICD-10-CM | POA: Diagnosis not present

## 2021-06-11 DIAGNOSIS — R279 Unspecified lack of coordination: Secondary | ICD-10-CM | POA: Diagnosis not present

## 2021-06-12 NOTE — Progress Notes (Signed)
Yesenia, Payne ([id number]) Visit Report for 06/11/2021 Chief Complaint Document Details Patient Name: Date of Service: [place] Yesenia Payne, [name] 06/11/2021 1:15 PM Medical Record Number: [id number] Patient Account Number: [id number] Date of Birth/Sex: Treating RN: 16-Nov-1950 (70 y.o. Yesenia Payne Primary Care Provider: [name] Other Clinician: Referring Provider: Treating Provider/Extender: Alysia Penna in Treatment: 6 Information Obtained from: Patient Chief Complaint Left lower extremity wounds and right upper leg wound Electronic Signature(s) Signed: 06/11/2021 2:58:18 PM By: Kalman Shan DO Entered By: Kalman Shan on 06/11/2021 14:44:47 -------------------------------------------------------------------------------- Debridement Details Patient Name: Date of Service: [place] Yesenia San, [name] 06/11/2021 1:15 PM Medical Record Number: [id number] Patient Account Number: [id number] Date of Birth/Sex: Treating RN: 08-05-1951 (70 y.o. Yesenia Payne, Yesenia Payne Primary Care Provider: [name] Other Clinician: Referring Provider: Treating Provider/Extender: Alysia Penna in Treatment: 6 Debridement Performed for Assessment: Wound #8 Left,Lateral Upper Leg Performed By: Physician Kalman Shan, DO Debridement Type: Debridement Severity of Tissue Pre Debridement: Fat layer exposed Level of Consciousness (Pre-procedure): Awake and Alert Pre-procedure Verification/Time Out Yes - 14:00 Taken: Start Time: 14:01 Pain Control: Lidocaine 4% T opical Solution T Area Debrided (L x W): otal 5 (cm) x 4 (cm) = 20 (cm) Tissue and other material debrided: Viable, Non-Viable, Fat, Slough, Subcutaneous, Skin: Dermis , Skin: Epidermis, Fibrin/Exudate, Slough Level: Skin/Subcutaneous Tissue Debridement Description: Excisional Instrument: Blade, Forceps Bleeding: Moderate Hemostasis Achieved: Gel Foam End Time: 14:10 Procedural Pain:  2 Post Procedural Pain: 4 Response to Treatment: Procedure was tolerated well Level of Consciousness (Post- Awake and Alert procedure): Post Debridement Measurements of Total Wound Length: (cm) 12 Width: (cm) 8.5 Depth: (cm) 3.5 Volume: (cm) 280.387 Character of Wound/Ulcer Post Debridement: Requires Further Debridement Severity of Tissue Post Debridement: Fat layer exposed Post Procedure Diagnosis Same as Pre-procedure Electronic Signature(s) Signed: 06/11/2021 2:58:18 PM By: Kalman Shan DO Signed: 06/11/2021 6:14:36 PM By: Deon Pilling Entered By: Deon Pilling on 06/11/2021 14:30:34 -------------------------------------------------------------------------------- HPI Details Patient Name: Date of Service: [place] Yesenia San, [name] 06/11/2021 1:15 PM Medical Record Number: [id number] Patient Account Number: [id number] Date of Birth/Sex: Treating RN: [birth year] (70 y.o. 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 She has been seen in our clinic multiple times last seen in 03/2019 for right toe wounds. Today she presents for 3 wounds. 1 is located to the right upper leg and 2 to the left lower extremity. They started at the end of December And not sure how I started. She had not been using any dressings up until 2 weeks ago. She is currently using silver alginate to the right upper leg and Santyl to the wounds on the left leg. She has been on doxycycline and 2 rounds of clindamycin for cellulitis to these wounds. She finished her last round of antibiotics 3 weeks ago. She reports minimal pain to the wound.  She currently denies signs of infection including increased erythema, warmth or purulent drainage. 7/5; patient presents for 1 week follow-up. She has been using Santyl to the left lower extremity wounds and silver alginate to the right upper leg wound. She denies signs of infection. She has no complaints today. 7/19; patient presents for  1 week follow-up. She has been using Santyl to the left lower extremity wounds and silver alginate to the right upper leg wound. She denies signs of infection. She has had to use a lot of Santyl to the lateral wound since the necrotic tissue has been breaking down and the wound is deeper. 8/4; patient presents for 2-week follow-up. She has been using Santyl to the left lower extremity wound and silver alginate to the right upper leg wound. She has been using the wound VAC to the left lateral wound. She has no issues or complaints today. She denies signs of infection. Electronic Signature(s) Signed: 06/11/2021 2:58:18 PM By: Kalman Shan DO Entered By: Kalman Shan on 06/11/2021 14:45:22 -------------------------------------------------------------------------------- Physical Exam Details Patient Name: Date of Service: [place] Yesenia San, [name] 06/11/2021 1:15 PM Medical Record Number: [id number] Patient Account Number: [id number] Date of Birth/Sex: Treating RN: [birth year] (70 y.o. Yesenia Payne Primary Care Provider: [name] Other Clinician: Referring Provider: Treating Provider/Extender: Alysia Penna in Treatment: 6 Constitutional respirations regular, non-labored and within target range for patient.Marland Kitchen Psychiatric pleasant and cooperative. Notes Right lower extremity: Upper leg has an open wound with granulation tissue present. Left lower extremity: On the medial aspect 2 wounds close to each other limited to skin breakdown. Granulation tissue throughout Lateral side has a large open wound with nonviable  tissue Electronic Signature(s) Signed: 06/11/2021 2:58:18 PM By: Kalman Shan DO Entered By: Kalman Shan on 06/11/2021 14:54:13 -------------------------------------------------------------------------------- Physician Orders Details Patient Name: Date of Service: [place] Yesenia San, [name] 06/11/2021 1:15 PM Medical Record Number: [id number] Patient Account Number: [id number] Date of Birth/Sex: Treating RN: June 30, 1951 (70 y.o. Yesenia Payne, [place] Primary Care Provider: [name] Other Clinician: Referring Provider: Treating Provider/Extender: Alysia Penna in Treatment: 6 Verbal / Phone Orders: No Diagnosis Coding ICD-10 Coding Code Description [phone] 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ity L97.819 Non-pressure chronic ulcer of other part of right lower leg with unspecified severity E11.9 Type 2 diabetes mellitus without complications V95.638 Acquired absence of left leg below knee Follow-up Appointments ppointment in 2 weeks. - Dr. Heber [place] 06/25/2021 ***60 minutes extra time*** Return A Bathing/ Shower/ Hygiene Other Bathing/Shower/Hygiene Orders/Instructions: - You may sponge bathe Negative Presssure Wound Therapy Wound #8 Left,Lateral Upper Leg Wound Vac to wound continuously at 147m/hg pressure - change three times a week. Black Foam Off-Loading Turn and reposition every 2 hours Wound Treatment Wound #6 - Upper Leg Wound Laterality: Right Cleanser: Wound Cleanser (Generic) 1 x Per Day/15 Days Discharge Instructions: Cleanse the wound with wound cleanser prior to applying a clean dressing using gauze sponges, not tissue or cotton balls. Peri-Wound Care: Ketoconazole Cream 2% 1 x Per Day/15 Days Discharge Instructions: In office only*** patient to apply over the counter antifungal cream.***Apply Ketoconazole as directed Prim Dressing: KerraCel Ag Gelling Fiber Dressing, 4x5 in (silver alginate)  (Generic) 1 x Per Day/15 Days ary Discharge Instructions: Apply silver alginate to wound bed as instructed Secondary Dressing: Woven Gauze Sponge, Non-Sterile 4x4 in (Generic) 1 x Per Day/15 Days Discharge Instructions: Apply over primary dressing as directed. Secondary Dressing: ABD Pad, 5x9 (Generic) 1 x Per Day/15 Days Discharge Instructions: Apply over primary dressing as directed. Secured With: 12M Medipore H Soft Cloth Surgical T 4 x 2 (in/yd) (Generic) 1 x Per Day/15 Days ape Discharge Instructions: Secure dressing with tape as directed. Wound #7 - Upper Leg Wound Laterality: Left, Medial Cleanser: Anasept  Antimicrobial Skin and Wound Cleanser, 8 (oz) (Generic) 1 x Per Day/15 Days Discharge Instructions: Cleanse the wound with Anasept cleanser prior to applying a clean dressing using gauze sponges, not tissue or cotton balls. Prim Dressing: KerraCel Ag Gelling Fiber Dressing, 2x2 in (silver alginate) ary 1 x Per Day/15 Days Discharge Instructions: Apply silver alginate to wound bed as instructed Secondary Dressing: Woven Gauze Sponge, Non-Sterile 4x4 in (Generic) 1 x Per Day/15 Days Discharge Instructions: apply gauze over primary dressing as directed. Secondary Dressing: ABD Pad, 5x9 (Generic) 1 x Per Day/15 Days Discharge Instructions: Apply over primary dressing as directed. Secured With: 63M Medipore H Soft Cloth Surgical T 4 x 2 (in/yd) (Generic) 1 x Per Day/15 Days ape Discharge Instructions: Secure dressing with tape as directed. Wound #8 - Upper Leg Wound Laterality: Left, Lateral Cleanser: Anasept Antimicrobial Skin and Wound Cleanser, 8 (oz) 3 x Per Week/15 Days Discharge Instructions: Cleanse the wound with Anasept cleanser prior to applying a clean dressing using gauze sponges, not tissue or cotton balls. Electronic Signature(s) Signed: 06/11/2021 2:58:18 PM By: Kalman Shan DO Entered By: Kalman Shan on 06/11/2021  14:54:30 -------------------------------------------------------------------------------- Problem List Details Patient Name: Date of Service: [place] Yesenia San, [name] 06/11/2021 1:15 PM Medical Record Number: [id number] Patient Account Number: [id number] Date of Birth/Sex: Treating RN: 1951/08/25 (69 y.o. Yesenia Payne, Yesenia Payne Primary Care Provider: [name] Other Clinician: Referring Provider: Treating Provider/Extender: Alysia Penna in Treatment: 6 Active Problems ICD-10 Encounter Code Description Active Date MDM Diagnosis L97.829 Non-pressure chronic ulcer of other part of left lower leg with unspecified 04/30/2021 No Yes severity L97.819 Non-pressure chronic ulcer of other part of right lower leg with unspecified 04/30/2021 No Yes severity E11.9 Type 2 diabetes mellitus without complications 07/31/3006 No Yes Z89.512 Acquired absence of left leg below knee 04/30/2021 No Yes Inactive Problems Resolved Problems Electronic Signature(s) Signed: 06/11/2021 2:58:18 PM By: Kalman Shan DO Entered By: Kalman Shan on 06/11/2021 14:44:28 -------------------------------------------------------------------------------- Progress Note Details Patient Name: Date of Service: [place] Yesenia San, [name] 06/11/2021 1:15 PM Medical Record Number: [id number] Patient Account Number: [id number] Date of Birth/Sex: Treating RN: September 02, 1951 (70 y.o. 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 She  has been seen in our clinic multiple times last seen in 03/2019 for right toe wounds. Today she presents for 3 wounds. 1 is located to the right upper leg and 2 to the left lower extremity. They started at the end of December And not sure how I started. She had not been using any dressings up until 2 weeks ago. She is currently using silver alginate to the right upper leg and Santyl to the wounds on the left leg. She has been on doxycycline and 2 rounds of clindamycin for cellulitis to these wounds. She finished her last round of antibiotics 3 weeks ago. She reports minimal pain to the wound. She currently denies signs of infection including increased erythema, warmth or purulent drainage. 7/5; patient presents for 1 week follow-up. She has been using Santyl to the left lower extremity wounds and silver alginate to the right upper leg wound.  She denies signs of infection. She has no complaints today. 7/19; patient presents for 1 week follow-up. She has been using Santyl to the left lower extremity wounds and silver alginate to the right upper leg wound. She denies signs of infection. She has had to use a lot of Santyl to the lateral wound since the necrotic tissue has been breaking down and the wound is deeper. 8/4; patient presents for 2-week follow-up. She has been using Santyl to the left lower extremity wound and silver alginate to the right upper leg wound. She has been using the wound VAC to the left lateral wound. She has no issues or complaints today. She denies signs of infection. Patient History Information obtained from Patient. Family History Cancer - Child, Diabetes - Father, Heart Disease - Mother,Father, Hypertension - Mother,Father, No family history of Hereditary Spherocytosis, Kidney Disease, Lung Disease, Seizures, Stroke, Thyroid Problems, Tuberculosis. Social History Never smoker, Marital Status - Married, Alcohol Use - Never, Drug Use - No History, Caffeine Use - Daily - coffee  , sodas. Medical History Eyes Patient has history of Cataracts - lens implant done, Glaucoma Denies history of Optic Neuritis Ear/Nose/Mouth/Throat Patient has history of Chronic sinus problems/congestion - seasonal Denies history of Middle ear problems Hematologic/Lymphatic Denies history of Anemia, Hemophilia, Human Immunodeficiency Virus, Lymphedema, Sickle Cell Disease Respiratory Patient has history of Asthma Denies history of Aspiration, Chronic Obstructive Pulmonary Disease (COPD), Pneumothorax, Sleep Apnea, Tuberculosis Cardiovascular Patient has history of Hypertension - not on meds presently Denies history of Angina, Arrhythmia, Congestive Heart Failure, Coronary Artery Disease, Deep Vein Thrombosis, Hypotension, Myocardial Infarction, Peripheral Arterial Disease, Peripheral Venous Disease, Phlebitis, Vasculitis Gastrointestinal Denies history of Cirrhosis , Colitis, Crohnoos, Hepatitis A, Hepatitis B, Hepatitis C Endocrine Patient has history of Type II Diabetes Denies history of Type I Diabetes Genitourinary Denies history of End Stage Renal Disease Immunological Denies history of Lupus Erythematosus, Raynaudoos, Scleroderma Integumentary (Skin) Denies history of History of Burn Musculoskeletal Patient has history of Osteoarthritis Denies history of Gout, Rheumatoid Arthritis, Osteomyelitis Neurologic Patient has history of Neuropathy Denies history of Dementia, Quadriplegia, Paraplegia, Seizure Disorder Oncologic Denies history of Received Chemotherapy, Received Radiation Psychiatric Denies history of Anorexia/bulimia, Confinement Anxiety Medical A Surgical History Notes nd Cardiovascular Hyperlipidemia Gastrointestinal GERD, Diverticulitis Genitourinary Overactive Bladder Musculoskeletal Fibromyalgia, toe amputations x 3 Psychiatric Anxiety Objective Constitutional respirations regular, non-labored and within target range for patient.. Vitals Time  Taken: 1:21 PM, Height: 63 in, Weight: 279 lbs, BMI: 49.4, Temperature: 98.3 F, Pulse: 93 bpm, Respiratory Rate: 18 breaths/min, Blood Pressure: 131/62 mmHg. Psychiatric pleasant and cooperative. General Notes: Right lower extremity: Upper leg has an open wound with granulation tissue present. Left lower extremity: On the medial aspect 2 wounds close to each other limited to skin breakdown. Granulation tissue throughout Lateral side has a large open wound with nonviable tissue Integumentary (Hair, Skin) Wound #6 status is Open. Original cause of wound was Gradually Appeared. The date acquired was: 11/06/2020. The wound has been in treatment 6 weeks. The wound is located on the Right Upper Leg. The wound measures 1.8cm length x 2.5cm width x 0.4cm depth; 3.534cm^2 area and 1.414cm^3 volume. There is Fat Layer (Subcutaneous Tissue) exposed. There is a medium amount of serosanguineous drainage noted. The wound margin is flat and intact. There is large (67-100%) red, pink granulation within the wound bed. There is no necrotic tissue within the wound bed. Wound #7 status is Open. Original cause of wound was Gradually Appeared. The date acquired was: 01/06/2021. The  wound has been in treatment 6 weeks. The wound is located on the Left,Medial Upper Leg. The wound measures 4cm length x 1.5cm width x 0.1cm depth; 4.712cm^2 area and 0.471cm^3 volume. There is Fat Layer (Subcutaneous Tissue) exposed. There is no tunneling or undermining noted. There is a medium amount of serosanguineous drainage noted. The wound margin is flat and intact. There is large (67-100%) red, pink granulation within the wound bed. There is no necrotic tissue within the wound bed. Wound #8 status is Open. Original cause of wound was Gradually Appeared. The date acquired was: 01/06/2021. The wound has been in treatment 6 weeks. The wound is located on the Left,Lateral Upper Leg. The wound measures 12cm length x 8.5cm width x 3.5cm depth;  80.111cm^2 area and 280.387cm^3 volume. There is muscle and Fat Layer (Subcutaneous Tissue) exposed. There is no undermining noted, however, there is tunneling at 10:00 with a maximum distance of 5cm. There is additional tunneling and at 7:00 with a maximum distance of 2.2cm. There is a large amount of purulent drainage noted. Foul odor after cleansing was noted. The wound margin is distinct with the outline attached to the wound base. There is medium (34-66%) red granulation within the wound bed. There is a medium (34-66%) amount of necrotic tissue within the wound bed including Adherent Slough. Assessment Active Problems ICD-10 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ity Non-pressure chronic ulcer of other part of right lower leg with unspecified severity Type 2 diabetes mellitus without complications Acquired absence of left leg below knee Overall patient's wounds are stable with no signs of infection. I recommended continuing the silver alginate to the right upper leg wound. T the left medial leg o wound she can use silver alginate as well and stop Santyl. T the lateral leg wound I debrided nonviable tissue and I recommended continuing the wound VAC. o Follow-up in 2 weeks. Procedures Wound #8 Pre-procedure diagnosis of Wound #8 is a Diabetic Wound/Ulcer of the Lower Extremity located on the Left,Lateral Upper Leg .Severity of Tissue Pre Debridement is: Fat layer exposed. There was a Excisional Skin/Subcutaneous Tissue Debridement with a total area of 20 sq cm performed by Kalman Shan, DO. With the following instrument(s): Blade, and Forceps to remove Viable and Non-Viable tissue/material. Material removed includes Fat, Subcutaneous Tissue, Slough, Skin: Dermis, Skin: Epidermis, and Fibrin/Exudate after achieving pain control using Lidocaine 4% T opical Solution. A time out was conducted at 14:00, prior to the start of the procedure. A Moderate  amount of bleeding was controlled with Gel Foam. The procedure was tolerated well with a pain level of 2 throughout and a pain level of 4 following the procedure. Post Debridement Measurements: 12cm length x 8.5cm width x 3.5cm depth; 280.387cm^3 volume. Character of Wound/Ulcer Post Debridement requires further debridement. Severity of Tissue Post Debridement is: Fat layer exposed. Post procedure Diagnosis Wound #8: Same as Pre-Procedure Plan Follow-up Appointments: Return Appointment in 2 weeks. - Dr. Heber [place] 06/25/2021 ***60 minutes extra time*** Bathing/ Shower/ Hygiene: Other Bathing/Shower/Hygiene Orders/Instructions: - You may sponge bathe Negative Presssure Wound Therapy: Wound #8 Left,Lateral Upper Leg: Wound Vac to wound continuously at 134m/hg pressure - change three times a week. Black Foam Off-Loading: Turn and reposition every 2 hours WOUND #6: - Upper Leg Wound Laterality: Right Cleanser: Wound Cleanser (Generic) 1 x Per Day/15 Days Discharge Instructions: Cleanse the wound with wound cleanser prior to applying a clean dressing using gauze sponges, not tissue or cotton balls. Peri-Wound Care:  Ketoconazole Cream 2% 1 x Per Day/15 Days Discharge Instructions: In office only*** patient to apply over the counter antifungal cream.***Apply Ketoconazole as directed Prim Dressing: KerraCel Ag Gelling Fiber Dressing, 4x5 in (silver alginate) (Generic) 1 x Per Day/15 Days ary Discharge Instructions: Apply silver alginate to wound bed as instructed Secondary Dressing: Woven Gauze Sponge, Non-Sterile 4x4 in (Generic) 1 x Per Day/15 Days Discharge Instructions: Apply over primary dressing as directed. Secondary Dressing: ABD Pad, 5x9 (Generic) 1 x Per Day/15 Days Discharge Instructions: Apply over primary dressing as directed. Secured With: 81M Medipore H Soft Cloth Surgical T 4 x 2 (in/yd) (Generic) 1 x Per Day/15 Days ape Discharge Instructions: Secure dressing with tape as  directed. WOUND #7: - Upper Leg Wound Laterality: Left, Medial Cleanser: Anasept Antimicrobial Skin and Wound Cleanser, 8 (oz) (Generic) 1 x Per Day/15 Days Discharge Instructions: Cleanse the wound with Anasept cleanser prior to applying a clean dressing using gauze sponges, not tissue or cotton balls. Prim Dressing: KerraCel Ag Gelling Fiber Dressing, 2x2 in (silver alginate) 1 x Per Day/15 Days ary Discharge Instructions: Apply silver alginate to wound bed as instructed Secondary Dressing: Woven Gauze Sponge, Non-Sterile 4x4 in (Generic) 1 x Per Day/15 Days Discharge Instructions: apply gauze over primary dressing as directed. Secondary Dressing: ABD Pad, 5x9 (Generic) 1 x Per Day/15 Days Discharge Instructions: Apply over primary dressing as directed. Secured With: 81M Medipore H Soft Cloth Surgical T 4 x 2 (in/yd) (Generic) 1 x Per Day/15 Days ape Discharge Instructions: Secure dressing with tape as directed. WOUND #8: - Upper Leg Wound Laterality: Left, Lateral Cleanser: Anasept Antimicrobial Skin and Wound Cleanser, 8 (oz) 3 x Per Week/15 Days Discharge Instructions: Cleanse the wound with Anasept cleanser prior to applying a clean dressing using gauze sponges, not tissue or cotton balls. 1. In office sharp debridement 2. Continue silver alginate to the right upper leg wound and now do this to the left medial wound 3. Wound VAC to the left lateral wound 4. Follow-up in 2 weeks Electronic Signature(s) Signed: 06/11/2021 2:58:18 PM By: Kalman Shan DO Entered By: Kalman Shan on 06/11/2021 14:57:40 -------------------------------------------------------------------------------- HxROS Details Patient Name: Date of Service: [place] Yesenia San, [name] 06/11/2021 1:15 PM Medical Record Number: [id number] Patient Account Number: [id number] Date of Birth/Sex: Treating RN: 05-Jan-1951 (70 y.o. Yesenia Payne, Yesenia Payne Primary Care Provider: [name] Other Clinician: Referring  Provider: Treating Provider/Extender: Alysia Penna in Treatment: 6 Information Obtained From Patient Eyes Medical History: Positive for: Cataracts - lens implant done; Glaucoma Negative for: Optic Neuritis Ear/Nose/Mouth/Throat Medical History: Positive for: Chronic sinus problems/congestion - seasonal Negative for: Middle ear problems Hematologic/Lymphatic Medical History: Negative for: Anemia; Hemophilia; Human Immunodeficiency Virus; Lymphedema; Sickle Cell Disease Respiratory Medical History: Positive for: Asthma Negative for: Aspiration; Chronic Obstructive Pulmonary Disease (COPD); Pneumothorax; Sleep Apnea; Tuberculosis Cardiovascular Medical History: Positive for: Hypertension - not on meds presently Negative for: Angina; Arrhythmia; Congestive Heart Failure; Coronary Artery Disease; Deep Vein Thrombosis; Hypotension; Myocardial Infarction; Peripheral Arterial Disease; Peripheral Venous Disease; Phlebitis; Vasculitis Past Medical History Notes: Hyperlipidemia Gastrointestinal Medical History: Negative for: Cirrhosis ; Colitis; Crohns; Hepatitis A; Hepatitis B; Hepatitis C Past Medical History Notes: GERD, Diverticulitis Endocrine Medical History: Positive for: Type II Diabetes Negative for: Type I Diabetes Time with diabetes: 12 years Treated with: Insulin Blood sugar tested every day: Yes T ested : bid Blood sugar testing results: Breakfast: 140 max; Bedtime: 220's max Genitourinary Medical History: Negative for: End Stage Renal Disease Past Medical  History Notes: Overactive Bladder Immunological Medical History: Negative for: Lupus Erythematosus; Raynauds; Scleroderma Integumentary (Skin) Medical History: Negative for: History of Burn Musculoskeletal Medical History: Positive for: Osteoarthritis Negative for: Gout; Rheumatoid Arthritis; Osteomyelitis Past Medical History Notes: Fibromyalgia, toe amputations x  3 Neurologic Medical History: Positive for: Neuropathy Negative for: Dementia; Quadriplegia; Paraplegia; Seizure Disorder Oncologic Medical History: Negative for: Received Chemotherapy; Received Radiation Psychiatric Medical History: Negative for: Anorexia/bulimia; Confinement Anxiety Past Medical History Notes: Anxiety HBO Extended History Items Ear/Nose/Mouth/Throat: Eyes: Eyes: Chronic sinus Cataracts Glaucoma problems/congestion Immunizations Pneumococcal Vaccine: Received Pneumococcal Vaccination: Yes Received Pneumococcal Vaccination On or After 60th Birthday: No Immunization Notes: up to date on pneumonia and tetanus shots Implantable Devices None Family and Social History Cancer: Yes - Child; Diabetes: Yes - Father; Heart Disease: Yes - Mother,Father; Hereditary Spherocytosis: No; Hypertension: Yes - Mother,Father; Kidney Disease: No; Lung Disease: No; Seizures: No; Stroke: No; Thyroid Problems: No; Tuberculosis: No; Never smoker; Marital Status - Married; Alcohol Use: Never; Drug Use: No History; Caffeine Use: Daily - coffee , sodas; Financial Concerns: No; Food, Clothing or Shelter Needs: No; Support System Lacking: No; Transportation Concerns: No Electronic Signature(s) Signed: 06/11/2021 2:58:18 PM By: Kalman Shan DO Signed: 06/11/2021 6:14:36 PM By: Deon Pilling Entered By: Kalman Shan on 06/11/2021 14:45:35 -------------------------------------------------------------------------------- SuperBill Details Patient Name: Date of Service: [place] Yesenia San, [name] 06/11/2021 Medical Record Number: [id number] Patient Account Number: [id number] Date of Birth/Sex: Treating RN: [birth year] (70 y.o. 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

## 2021-06-14 DIAGNOSIS — L8990 Pressure ulcer of unspecified site, unspecified stage: Secondary | ICD-10-CM | POA: Diagnosis not present

## 2021-06-14 DIAGNOSIS — K219 Gastro-esophageal reflux disease without esophagitis: Secondary | ICD-10-CM | POA: Diagnosis not present

## 2021-06-16 NOTE — Progress Notes (Signed)
JERLINE, LINZY ([id number]) Visit Report for 06/11/2021 Arrival Information Details Patient Name: Date of Service: [name], [name] 06/11/2021 1:15 PM Medical Record Number: [id number] Patient Account Number: [id number] Date of Birth/Sex: Treating RN: Mar 01, 1951 (70 y.o. [name] Primary Care Kaslyn Richburg: [name] Other Clinician: Referring Eulalio Reamy: Treating Jodi Criscuolo/Extender: Alysia Penna in Treatment: 6 Visit Information History Since Last Visit Added or deleted any medications: No Patient Arrived: Stretcher Any new allergies or adverse reactions: No Arrival Time: 13:12 Had a fall or experienced change in No Accompanied By: daughter activities of daily living that may affect Transfer Assistance: Manual risk of falls: Patient Identification Verified: Yes Signs or symptoms of abuse/neglect since last visito No Secondary Verification Process Completed: Yes Hospitalized since last visit: No Patient Requires Transmission-Based Precautions: No Implantable device outside of the clinic excluding No Patient Has Alerts: No cellular tissue based products placed in the center since last visit: Has Dressing in Place as Prescribed: Yes Pain Present Now: No Electronic Signature(s) Signed: 06/12/2021 1:36:18 PM By: Lorrin Jackson Entered By: Lorrin Jackson on 06/11/2021 13:21:12 -------------------------------------------------------------------------------- Encounter Discharge Information Details Patient Name: Date of Service: [name], [name] 06/11/2021 1:15 PM Medical Record Number: [id number] Patient Account Number: [id number] Date of Birth/Sex: Treating RN: 16-Apr-1951 (70 y.o. [name], [name] Primary Care Athen Riel: [name] Other Clinician: Referring Atianna Haidar: Treating Amaya Blakeman/Extender: Alysia Penna in Treatment: 6 Encounter Discharge Information Items Post Procedure Vitals Discharge Condition:  Stable Temperature (F): 97.7 Ambulatory Status: Stretcher Pulse (bpm): 74 Discharge Destination: Home Respiratory Rate (breaths/min): 17 Transportation: Ambulance Blood Pressure (mmHg): 147/74 Accompanied By: daughter Schedule Follow-up Appointment: Yes Clinical Summary of Care: Patient Declined Electronic Signature(s) Signed: 06/16/2021 5:35:30 PM By: Rhae Hammock RN Entered By: Rhae Hammock on 06/11/2021 16:17:21 -------------------------------------------------------------------------------- Lower Extremity Assessment Details Patient Name: Date of Service: [name], [name] 06/11/2021 1:15 PM Medical Record Number: [id number] Patient Account Number: [id number] Date of Birth/Sex: Treating RN: 1951-04-24 (70 y.o. [name] Primary Care Kaion Tisdale: [name] Other Clinician: Referring Malisha Mabey: Treating Emira Eubanks/Extender: Alysia Penna in Treatment: 6 Electronic Signature(s) Signed: 06/12/2021 1:36:18 PM By: Lorrin Jackson Entered By: Lorrin Jackson on 06/11/2021 13:23:47 -------------------------------------------------------------------------------- Multi Wound Chart Details Patient Name: Date of Service: [name], [name] 06/11/2021 1:15 PM Medical Record Number: [id number] Patient Account Number: [id number] Date of Birth/Sex: Treating RN: 1951/10/08 (70 y.o. [name], Meta.Reding Primary Care Bennetta Rudden: [name] Other Clinician: Referring Ewan Grau: Treating Eben Choinski/Extender: Alysia Penna in Treatment: 6 Vital Signs Height(in): 70 Pulse(bpm): 62 Weight(lbs): 58 Blood Pressure(mmHg): 131/62 Body Mass Index(BMI): 49 Temperature(F): 98.3 Respiratory Rate(breaths/min): 18 Photos: Right Upper Leg Left, Medial Upper Leg Left, Lateral Upper Leg Wound Location: Gradually Appeared Gradually Appeared Gradually Appeared Wounding Event: Diabetic Wound/Ulcer of the Lower Diabetic Wound/Ulcer of  the Lower Diabetic Wound/Ulcer of the Lower Primary Etiology: Extremity Extremity Extremity Cataracts, Glaucoma, Chronic sinus Cataracts, Glaucoma, Chronic sinus Cataracts, Glaucoma, Chronic sinus Comorbid History: problems/congestion, Asthma, problems/congestion, Asthma, problems/congestion, Asthma, Hypertension, Type II Diabetes, Hypertension, Type II Diabetes, Hypertension, Type II Diabetes, Osteoarthritis, Neuropathy Osteoarthritis, Neuropathy Osteoarthritis, Neuropathy 11/06/2020 01/06/2021 01/06/2021 Date Acquired: 6 6 6  Weeks of Treatment: Open Open Open Wound Status: No Yes No Clustered Wound: N/A 2 N/A Clustered Quantity: 1.8x2.5x0.4 4x1.5x0.1 12x8.5x3.5 Measurements L x W x D (cm) 3.534 4.712 80.111 A (cm) : rea 1.414 0.471 280.387 Volume (cm) : 87.50% 88.90% 29.20% % Reduction in  A rea: 95.00% 94.40% 0.80% % Reduction in Volume: 10 Position 1 (o'clock): 5 Maximum Distance 1 (cm): 7 Position 2 (o'clock): 2.2 Maximum Distance 2 (cm): N/A No Yes Tunneling: Grade 2 Grade 2 Grade 2 Classification: Medium Medium Large Exudate A mount: Serosanguineous Serosanguineous Purulent Exudate Type: red, brown red, brown yellow, brown, green Exudate Color: No No Yes Foul Odor A Cleansing: fter N/A N/A No Odor A nticipated Due to Product Use: Flat and Intact Flat and Intact Distinct, outline attached Wound Margin: Large (67-100%) Large (67-100%) Medium (34-66%) Granulation A mount: Red, Pink Red, Pink Red Granulation Quality: None Present (0%) None Present (0%) Medium (34-66%) Necrotic A mount: Fat Layer (Subcutaneous Tissue): Yes Fat Layer (Subcutaneous Tissue): Yes Fat Layer (Subcutaneous Tissue): Yes Exposed Structures: Fascia: No Fascia: No Muscle: Yes Tendon: No Tendon: No Fascia: No Muscle: No Muscle: No Tendon: No Joint: No Joint: No Joint: No Bone: No Bone: No Bone: No None Small (1-33%) None Epithelialization: N/A N/A Debridement -  Excisional Debridement: Pre-procedure Verification/Time Out N/A N/A 14:00 Taken: N/A N/A Lidocaine 4% Topical Solution Pain Control: N/A N/A Fat, Subcutaneous, Slough Tissue Debrided: N/A N/A Skin/Subcutaneous Tissue Level: N/A N/A 20 Debridement A (sq cm): rea N/A N/A Blade, Forceps Instrument: N/A N/A Moderate Bleeding: N/A N/A Gel Foam Hemostasis A chieved: N/A N/A 2 Procedural Pain: N/A N/A 4 Post Procedural Pain: N/A N/A Procedure was tolerated well Debridement Treatment Response: N/A N/A 12x8.5x3.5 Post Debridement Measurements L x W x D (cm) N/A N/A 280.387 Post Debridement Volume: (cm) N/A N/A Debridement Procedures Performed: Negative Pressure Wound Therapy Maintenance (NPWT) Treatment Notes Electronic Signature(s) Signed: 06/11/2021 2:58:18 PM By: Kalman Shan DO Signed: 06/11/2021 6:14:36 PM By: Deon Pilling Entered By: Kalman Shan on 06/11/2021 14:44:37 -------------------------------------------------------------------------------- Multi-Disciplinary Care Plan Details Patient Name: Date of Service: [name], [name] 06/11/2021 1:15 PM Medical Record Number: [id number] Patient Account Number: [id number] Date of Birth/Sex: Treating RN: 02-07-1951 (70 y.o. [name], Tammi Klippel Primary Care Verneice Caspers: [name] Other Clinician: Referring Quenna Doepke: Treating Steel Kerney/Extender: Alysia Penna in Treatment: 6 Active Inactive Wound/Skin Impairment Nursing Diagnoses: Impaired tissue integrity Knowledge deficit related to ulceration/compromised skin integrity Goals: Patient will demonstrate a reduced rate of smoking or cessation of smoking Date Initiated: 04/30/2021 Target Resolution Date: 07/03/2021 Goal Status: Active Patient will have a decrease in wound volume by X% from date: (specify in notes) Date Initiated: 04/30/2021 Target Resolution Date: 06/26/2021 Goal Status: Active Patient/caregiver will verbalize  understanding of skin care regimen Date Initiated: 04/30/2021 Target Resolution Date: 07/10/2021 Goal Status: Active Interventions: Assess patient/caregiver ability to obtain necessary supplies Assess patient/caregiver ability to perform ulcer/skin care regimen upon admission and as needed Assess ulceration(s) every visit Notes: Electronic Signature(s) Signed: 06/11/2021 6:14:36 PM By: Deon Pilling Entered By: Deon Pilling on 06/11/2021 13:12:38 -------------------------------------------------------------------------------- Negative Pressure Wound Therapy Maintenance (NPWT) Details Patient Name: Date of ServiceWarren Danes, [name] 06/11/2021 1:15 PM Medical Record Number: [id number] Patient Account Number: [id number] Date of Birth/Sex: Treating RN: 23-Mar-1951 (70 y.o. [name], Tammi Klippel Primary Care Shakeyla Giebler: [name] Other Clinician: Referring Ellionna Buckbee: Treating Mazal Ebey/Extender: Alysia Penna in Treatment: 6 NPWT Maintenance Performed for: Wound #8 Left, Lateral Upper Leg Additional Injuries Covered: No Performed By: Deon Pilling, RN Type: VAC System Coverage Size (sq cm): 102 Pressure Type: Constant Pressure Setting: 125 mmHG Drain Type: None Primary Contact: Non-Adherent Sponge/Dressing Type: Foam, Black Date Initiated: 06/02/2021 Dressing Removed: Yes Quantity of Sponges/Gauze Removed: x1 black  foam Canister Changed: Yes Dressing Reapplied: Yes Quantity of Sponges/Gauze Inserted: x3 black foam Respones T Treatment: o tolerated well Days On NPWT : 10 Post Procedure Diagnosis Same as Pre-procedure Electronic Signature(s) Signed: 06/11/2021 6:14:36 PM By: Deon Pilling Entered By: Deon Pilling on 06/11/2021 14:31:20 -------------------------------------------------------------------------------- Pain Assessment Details Patient Name: Date of Service: [name], [name] 06/11/2021 1:15 PM Medical Record Number: [id number] Patient  Account Number: [id number] Date of Birth/Sex: Treating RN: 1951/10/12 (70 y.o. [name] Primary Care Yashira Offenberger: [name] Other Clinician: Referring Emmry Hinsch: Treating Yuko Coventry/Extender: Alysia Penna in Treatment: 6 Active Problems Location of Pain Severity and Description of Pain Patient Has Paino No Site Locations Pain Management and Medication Current Pain Management: Electronic Signature(s) Signed: 06/12/2021 1:36:18 PM By: Lorrin Jackson Entered By: Lorrin Jackson on 06/11/2021 13:21:42 -------------------------------------------------------------------------------- Patient/Caregiver Education Details Patient Name: Date of Service: [name], [name] 8/4/2022andnbsp1:15 PM Medical Record Number: [id number] Patient Account Number: [id number] Date of Birth/Gender: Treating RN: 1951-10-14 (70 y.o. [name] Primary Care Physician: [name] Other Clinician: Referring Physician: Treating Physician/Extender: Alysia Penna in Treatment: 6 Education Assessment Education Provided To: Patient and Caregiver daughter Education Topics Provided Pressure: Handouts: Pressure Ulcers: Care and Offloading, Pressure Ulcers: Care and Offloading 2, Preventing Pressure Ulcers Methods: Explain/Verbal Responses: Reinforcements needed Wound/Skin Impairment: Handouts: Skin Care Do's and Dont's Methods: Explain/Verbal Responses: Reinforcements needed Electronic Signature(s) Signed: 06/11/2021 6:14:36 PM By: Deon Pilling Entered By: Deon Pilling on 06/11/2021 13:13:01 -------------------------------------------------------------------------------- Wound Assessment Details Patient Name: Date of Service: [name], [name] 06/11/2021 1:15 PM Medical Record Number: [id number] Patient Account Number: [id number] Date of Birth/Sex: Treating RN: 01/28/1951 (70 y.o. [name] Primary Care Angel Hobdy: [name] Other Clinician: Referring Jabori Henegar: Treating Sharonna Vinje/Extender: Alysia Penna in Treatment: 6 Wound Status Wound Number: 6 Primary Diabetic Wound/Ulcer of the Lower Extremity Etiology: Wound Location: Right Upper Leg Wound Open Wounding Event: Gradually Appeared Status: Date Acquired: 11/06/2020 Comorbid Cataracts, Glaucoma, Chronic sinus problems/congestion, Asthma, Weeks Of Treatment: 6 History: Hypertension, Type II Diabetes, Osteoarthritis, Neuropathy Clustered Wound: No Photos Wound Measurements Length: (cm) 1.8 Width: (cm) 2.5 Depth: (cm) 0.4 Area: (cm) 3.534 Volume: (cm) 1.414 % Reduction in Area: 87.5% % Reduction in Volume: 95% Epithelialization: None Wound Description Classification: Grade 2 Wound Margin: Flat and Intact Exudate Amount: Medium Exudate Type: Serosanguineous Exudate Color: red, brown Foul Odor After Cleansing: No Slough/Fibrino No Wound Bed Granulation Amount: Large (67-100%) Exposed Structure Granulation Quality: Red, Pink Fascia Exposed: No Necrotic Amount: None Present (0%) Fat Layer (Subcutaneous Tissue) Exposed: Yes Tendon Exposed: No Muscle Exposed: No Joint Exposed: No Bone Exposed: No Treatment Notes Wound #6 (Upper Leg) Wound Laterality: Right Cleanser Wound Cleanser Discharge Instruction: Cleanse the wound with wound cleanser prior to applying a clean dressing using gauze sponges, not tissue or cotton balls. Peri-Wound Care Ketoconazole Cream 2% Discharge Instruction: In office only*** patient to apply over the counter antifungal cream.***Apply Ketoconazole as directed Topical Primary Dressing KerraCel Ag Gelling Fiber Dressing, 4x5 in (silver alginate) Discharge Instruction: Apply silver alginate to wound bed as instructed Secondary Dressing Woven Gauze Sponge, Non-Sterile 4x4 in Discharge Instruction: Apply over primary dressing as directed. ABD Pad, 5x9 Discharge Instruction: Apply  over primary dressing as directed. Secured With 82M Medipore H Soft Cloth Surgical T 4 x 2 (in/yd) ape Discharge Instruction: Secure dressing with tape as directed. Compression Wrap Compression Stockings Add-Ons Electronic Signature(s) Signed: 06/12/2021  1:36:18 PM By: Lorrin Jackson Entered By: Lorrin Jackson on 06/11/2021 13:26:12 -------------------------------------------------------------------------------- Wound Assessment Details Patient Name: Date of Service: [name], [name] 06/11/2021 1:15 PM Medical Record Number: [id number] Patient Account Number: [id number] Date of Birth/Sex: Treating RN: Nov 15, 1950 (70 y.o. [name] Primary Care Sasuke Yaffe: [name] Other Clinician: Referring Jozette Castrellon: Treating Nykeria Mealing/Extender: Alysia Penna in Treatment: 6 Wound Status Wound Number: 7 Primary Diabetic Wound/Ulcer of the Lower Extremity Etiology: Wound Location: Left, Medial Upper Leg Wound Open Wounding Event: Gradually Appeared Status: Date Acquired: 01/06/2021 Comorbid Cataracts, Glaucoma, Chronic sinus problems/congestion, Asthma, Weeks Of Treatment: 6 History: Hypertension, Type II Diabetes, Osteoarthritis, Neuropathy Clustered Wound: Yes Photos Wound Measurements Length: (cm) 4 Width: (cm) 1.5 Depth: (cm) 0.1 Clustered Quantity: 2 Area: (cm) 4.712 Volume: (cm) 0.471 % Reduction in Area: 88.9% % Reduction in Volume: 94.4% Epithelialization: Small (1-33%) Tunneling: No Undermining: No Wound Description Classification: Grade 2 Wound Margin: Flat and Intact Exudate Amount: Medium Exudate Type: Serosanguineous Exudate Color: red, brown Foul Odor After Cleansing: No Slough/Fibrino No Wound Bed Granulation Amount: Large (67-100%) Exposed Structure Granulation Quality: Red, Pink Fascia Exposed: No Necrotic Amount: None Present (0%) Fat Layer (Subcutaneous Tissue) Exposed: Yes Tendon Exposed: No Muscle Exposed:  No Joint Exposed: No Bone Exposed: No Treatment Notes Wound #7 (Upper Leg) Wound Laterality: Left, Medial Cleanser Anasept Antimicrobial Skin and Wound Cleanser, 8 (oz) Discharge Instruction: Cleanse the wound with Anasept cleanser prior to applying a clean dressing using gauze sponges, not tissue or cotton balls. Peri-Wound Care Topical Primary Dressing KerraCel Ag Gelling Fiber Dressing, 2x2 in (silver alginate) Discharge Instruction: Apply silver alginate to wound bed as instructed Secondary Dressing Woven Gauze Sponge, Non-Sterile 4x4 in Discharge Instruction: apply gauze over primary dressing as directed. ABD Pad, 5x9 Discharge Instruction: Apply over primary dressing as directed. Secured With 32M Medipore H Soft Cloth Surgical T 4 x 2 (in/yd) ape Discharge Instruction: Secure dressing with tape as directed. Compression Wrap Compression Stockings Add-Ons Electronic Signature(s) Signed: 06/12/2021 1:36:18 PM By: Lorrin Jackson Entered By: Lorrin Jackson on 06/11/2021 13:41:23 -------------------------------------------------------------------------------- Wound Assessment Details Patient Name: Date of Service: [name], [name] 06/11/2021 1:15 PM Medical Record Number: [id number] Patient Account Number: [id number] Date of Birth/Sex: Treating RN: Mar 21, 1951 (70 y.o. [name] Primary Care Brayah Urquilla: [name] Other Clinician: Referring Mccrae Speciale: Treating Lyvia Mondesir/Extender: Alysia Penna in Treatment: 6 Wound Status Wound Number: 8 Primary Diabetic Wound/Ulcer of the Lower Extremity Etiology: Wound Location: Left, Lateral Upper Leg Wound Open Wounding Event: Gradually Appeared Status: Date Acquired: 01/06/2021 Comorbid Cataracts, Glaucoma, Chronic sinus problems/congestion, Asthma, Weeks Of Treatment: 6 History: Hypertension, Type II Diabetes, Osteoarthritis, Neuropathy Clustered Wound: No Photos Wound Measurements Length:  (cm) 12 Width: (cm) 8.5 Depth: (cm) 3.5 Area: (cm) 80.111 Volume: (cm) 280.387 % Reduction in Area: 29.2% % Reduction in Volume: 0.8% Epithelialization: None Tunneling: Yes Location 1 Position (o'clock): 10 Maximum Distance: (cm) 5 Location 2 Position (o'clock): 7 Maximum Distance: (cm) 2.2 Undermining: No Wound Description Classification: Grade 2 Wound Margin: Distinct, outline attached Exudate Amount: Large Exudate Type: Purulent Exudate Color: yellow, brown, green Foul Odor After Cleansing: Yes Due to Product Use: No Slough/Fibrino Yes Wound Bed Granulation Amount: Medium (34-66%) Exposed Structure Granulation Quality: Red Fascia Exposed: No Necrotic Amount: Medium (34-66%) Fat Layer (Subcutaneous Tissue) Exposed: Yes Necrotic Quality: Adherent Slough Tendon Exposed: No Muscle Exposed: Yes Necrosis of Muscle: No Joint Exposed: No Bone Exposed: No Treatment Notes Wound #8 (  Upper Leg) Wound Laterality: Left, Lateral Cleanser Anasept Antimicrobial Skin and Wound Cleanser, 8 (oz) Discharge Instruction: Cleanse the wound with Anasept cleanser prior to applying a clean dressing using gauze sponges, not tissue or cotton balls. Peri-Wound Care Topical Primary Dressing Secondary Dressing Secured With Compression Wrap Compression Stockings Add-Ons Electronic Signature(s) Signed: 06/12/2021 1:36:18 PM By: Lorrin Jackson Entered By: Lorrin Jackson on 06/11/2021 13:38:35 -------------------------------------------------------------------------------- Vitals Details Patient Name: Date of Service: [name], [name] 06/11/2021 1:15 PM Medical Record Number: [id number] Patient Account Number: [id number] Date of Birth/Sex: Treating RN: 1951/02/17 (70 y.o. [name] Primary Care Tauheed Mcfayden: [name] Other Clinician: Referring Nuel Dejaynes: Treating Julia Alkhatib/Extender: Alysia Penna in Treatment: 6 Vital Signs Time Taken:  13:21 Temperature (F): 98.3 Height (in): 63 Pulse (bpm): 93 Weight (lbs): 279 Respiratory Rate (breaths/min): 18 Body Mass Index (BMI): 49.4 Blood Pressure (mmHg): 131/62 Reference Range: 80 - 120 mg / dl Electronic Signature(s) Signed: 06/12/2021 1:36:18 PM By: Lorrin Jackson Entered By: Lorrin Jackson on 06/11/2021 13:21:35

## 2021-06-20 DIAGNOSIS — E1165 Type 2 diabetes mellitus with hyperglycemia: Secondary | ICD-10-CM | POA: Diagnosis not present

## 2021-06-20 DIAGNOSIS — L89159 Pressure ulcer of sacral region, unspecified stage: Secondary | ICD-10-CM | POA: Diagnosis not present

## 2021-06-20 DIAGNOSIS — I10 Essential (primary) hypertension: Secondary | ICD-10-CM | POA: Diagnosis not present

## 2021-06-20 DIAGNOSIS — G894 Chronic pain syndrome: Secondary | ICD-10-CM | POA: Diagnosis not present

## 2021-06-25 ENCOUNTER — Other Ambulatory Visit: Payer: Self-pay

## 2021-06-25 ENCOUNTER — Encounter (HOSPITAL_BASED_OUTPATIENT_CLINIC_OR_DEPARTMENT_OTHER): Payer: Medicare Other | Admitting: Internal Medicine

## 2021-06-25 DIAGNOSIS — Z7401 Bed confinement status: Secondary | ICD-10-CM | POA: Diagnosis not present

## 2021-06-25 DIAGNOSIS — E11622 Type 2 diabetes mellitus with other skin ulcer: Secondary | ICD-10-CM | POA: Diagnosis not present

## 2021-06-25 DIAGNOSIS — E119 Type 2 diabetes mellitus without complications: Secondary | ICD-10-CM

## 2021-06-25 DIAGNOSIS — Z89512 Acquired absence of left leg below knee: Secondary | ICD-10-CM | POA: Diagnosis not present

## 2021-06-25 DIAGNOSIS — L97919 Non-pressure chronic ulcer of unspecified part of right lower leg with unspecified severity: Secondary | ICD-10-CM | POA: Diagnosis not present

## 2021-06-25 DIAGNOSIS — L97829 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ity: Secondary | ICD-10-CM

## 2021-06-25 DIAGNOSIS — L97929 Non-pressure chronic ulcer of unspecified part of left lower leg with unspecified severity: Secondary | ICD-10-CM | POA: Diagnosis not present

## 2021-06-25 DIAGNOSIS — Z833 Family history of diabetes mellitus: Secondary | ICD-10-CM | POA: Diagnosis not present

## 2021-06-25 DIAGNOSIS — E1142 Type 2 diabetes mellitus with diabetic polyneuropathy: Secondary | ICD-10-CM | POA: Diagnosis not present

## 2021-06-25 DIAGNOSIS — R531 Weakness: Secondary | ICD-10-CM | POA: Diagnosis not present

## 2021-06-25 DIAGNOSIS — L97819 Non-pressure chronic ulcer of other part of right lower leg with unspecified severity: Secondary | ICD-10-CM | POA: Diagnosis not present

## 2021-06-25 DIAGNOSIS — E1151 Type 2 diabetes mellitus with diabetic peripheral angiopathy without gangrene: Secondary | ICD-10-CM | POA: Diagnosis not present

## 2021-06-25 DIAGNOSIS — R5381 Other malaise: Secondary | ICD-10-CM | POA: Diagnosis not present

## 2021-06-25 DIAGNOSIS — Z794 Long term (current) use of insulin: Secondary | ICD-10-CM | POA: Diagnosis not present

## 2021-06-25 DIAGNOSIS — Z743 Need for continuous supervision: Secondary | ICD-10-CM | POA: Diagnosis not present

## 2021-06-25 NOTE — Progress Notes (Signed)
Yesenia Payne, Yesenia Payne ([id number]) Visit Report for 06/25/2021 Arrival Information Details Patient Name: Date of Service: [name], [name] 06/25/2021 2:30 PM Medical Record Number: [id number] Patient Account Number: [id number] Date of Birth/Sex: Treating RN: 1951-04-25 (70 y.o. [name], [name] Primary Care Terrick Allred: [name] Other Clinician: Referring Alois Mincer: Treating Zion Ta/Extender: Alysia Penna in Treatment: 8 Visit Information History Since Last Visit Added or deleted any medications: No Patient Arrived: Stretcher Any new allergies or adverse reactions: No Arrival Time: 14:32 Had a fall or experienced change in No Accompanied By: daughter activities of daily living that may affect Transfer Assistance: None risk of falls: Patient Identification Verified: Yes Signs or symptoms of abuse/neglect since last visito No Secondary Verification Process Completed: Yes Hospitalized since last visit: No Patient Requires Transmission-Based Precautions: No Implantable device outside of the clinic excluding No Patient Has Alerts: No cellular tissue based products placed in the center since last visit: Has Dressing in Place as Prescribed: Yes Pain Present Now: No Electronic Signature(s) Signed: 06/25/2021 5:10:54 PM By: Rhae Hammock RN Entered By: Rhae Hammock on 06/25/2021 14:33:02 -------------------------------------------------------------------------------- Clinic Level of Care Assessment Details Patient Name: Date of Service: MO 77, [name] 06/25/2021 2:30 PM Medical Record Number: [id number] Patient Account Number: [id number] Date of Birth/Sex: Treating RN: 02-05-1951 (70 y.o. [name], [name] Primary Care Alexi Geibel: [name] Other Clinician: Referring Maximilien Hayashi: Treating Charlyn Vialpando/Extender: Alysia Penna in Treatment: 8 Clinic Level of Care Assessment Items TOOL 4 Quantity Score X- 1  0 Use when only an EandM is performed on FOLLOW-UP visit ASSESSMENTS - Nursing Assessment / Reassessment X- 1 10 Reassessment of Co-morbidities (includes updates in patient status) X- 1 5 Reassessment of Adherence to Treatment Plan ASSESSMENTS - Wound and Skin A ssessment / Reassessment _0  - 0 Simple Wound Assessment / Reassessment - one wound X- 3 5 Complex Wound Assessment / Reassessment - multiple wounds _1  - 0 Dermatologic / Skin Assessment (not related to wound area) ASSESSMENTS - Focused Assessment _2  - 0 Circumferential Edema Measurements - multi extremities _3  - 0 Nutritional Assessment / Counseling / Intervention _4  - 0 Lower Extremity Assessment (monofilament, tuning fork, pulses) _5  - 0 Peripheral Arterial Disease Assessment (using hand held doppler) ASSESSMENTS - Ostomy and/or Continence Assessment and Care _6  - 0 Incontinence Assessment and Management _7  - 0 Ostomy Care Assessment and Management (repouching, etc.) PROCESS - Coordination of Care _8  - 0 Simple Patient / Family Education for ongoing care X- 1 20 Complex (extensive) Patient / Family Education for ongoing care X- 1 10 Staff obtains Programmer, systems, Records, T Results / Process Orders est _9  - 0 Staff telephones HHA, Nursing Homes / Clarify orders / etc _10  - 0 Routine Transfer to another Facility (non-emergent condition) _11  - 0 Routine Hospital Admission (non-emergent condition) _12  - 0 New Admissions / Biomedical engineer / Ordering NPWT Apligraf, etc. , _13  - 0 Emergency Hospital Admission (emergent condition) _14  - 0 Simple Discharge Coordination X- 1 15 Complex (extensive) Discharge Coordination PROCESS - Special Needs _15  - 0 Pediatric / Minor Patient Management _16  - 0 Isolation Patient Management _17  - 0 Hearing / Language / Visual special needs _18  - 0 Assessment of Community assistance (transportation, D/C planning, etc.) _19  - 0 Additional assistance / Altered mentation _20  -  0 Support Surface(s) Assessment (bed, cushion, seat, etc.) INTERVENTIONS - Wound Cleansing / Measurement _21  - 0 Simple Wound Cleansing - one wound X- 3 5 Complex Wound Cleansing -  multiple wounds X- 1 5 Wound Imaging (photographs - any number of wounds) _0  - 0 Wound Tracing (instead of photographs) _1  - 0 Simple Wound Measurement - one wound X- 3 5 Complex Wound Measurement - multiple wounds INTERVENTIONS - Wound Dressings _2  - 0 Small Wound Dressing one or multiple wounds X- 3 15 Medium Wound Dressing one or multiple wounds _3  - 0 Large Wound Dressing one or multiple wounds <ELFYBOFBPZWCHENI>_7<\/POEUMPNTIRWERXVQ>_0  - 0 Application of Medications - topical <GQQPYPPJKDTOIZTI>_4<\/PYKDXIPJASNKNLZJ>_6  - 0 Application of Medications - injection INTERVENTIONS - Miscellaneous _6  - 0 External ear exam _7  - 0 Specimen Collection (cultures, biopsies, blood, body fluids, etc.) _8  - 0 Specimen(s) / Culture(s) sent or taken to Lab for analysis _9  - 0 Patient Transfer (multiple staff / Civil Service fast streamer / Similar devices) _10  - 0 Simple Staple / Suture removal (25 or less) _11  - 0 Complex Staple / Suture removal (26 or more) _12  - 0 Hypo / Hyperglycemic Management (close monitor of Blood Glucose) _13  - 0 Ankle / Brachial Index (ABI) - do not check if billed separately X- 1 5 Vital Signs Has the patient been seen at the hospital within the last three years: Yes Total Score: 160 Level Of Care: New/Established - Level 5 Electronic Signature(s) Signed: 06/25/2021 5:10:54 PM By: Rhae Hammock RN Entered By: Rhae Hammock on 06/25/2021 15:22:51 -------------------------------------------------------------------------------- Encounter Discharge Information Details Patient Name: Date of Service: [name], [name] 06/25/2021 2:30 PM Medical Record Number: [id number] Patient Account Number: [id number] Date of Birth/Sex: Treating RN: 11/24/1950 (70 y.o. [name], [name] Primary Care Taylyn Brame: [name] Other Clinician: Referring Laramie Meissner: Treating  Rashena Dowling/Extender: Alysia Penna in Treatment: 8 Encounter Discharge Information Items Discharge Condition: Stable Ambulatory Status: Stretcher Discharge Destination: Home Transportation: Private Auto Accompanied By: daughter Schedule Follow-up Appointment: Yes Clinical Summary of Care: Patient Declined Electronic Signature(s) Signed: 06/25/2021 5:10:54 PM By: Rhae Hammock RN Entered By: Rhae Hammock on 06/25/2021 15:24:34 -------------------------------------------------------------------------------- Lower Extremity Assessment Details Patient Name: Date of Service: [name], [name] 06/25/2021 2:30 PM Medical Record Number: [id number] Patient Account Number: [id number] Date of Birth/Sex: Treating RN: Nov 26, 1950 (70 y.o. [name], [name] Primary Care Chirag Krueger: [name] Other Clinician: Referring Celie Desrochers: Treating Shae Hinnenkamp/Extender: Alysia Penna in Treatment: 8 Electronic Signature(s) Signed: 06/25/2021 5:10:54 PM By: Rhae Hammock RN Entered By: Rhae Hammock on 06/25/2021 14:32:12 -------------------------------------------------------------------------------- Multi Wound Chart Details Patient Name: Date of Service: [name], [name] 06/25/2021 2:30 PM Medical Record Number: [id number] Patient Account Number: [id number] Date of Birth/Sex: Treating RN: August 13, 1951 (70 y.o. [name], [name] Primary Care Amron Guerrette: [name] Other Clinician: Referring Kalman Nylen: Treating Keely Drennan/Extender: Alysia Penna in Treatment: 8 Vital Signs Height(in): 28 Pulse(bpm): 84 Weight(lbs): 8 Blood Pressure(mmHg): 120/74 Body Mass Index(BMI): 49 Temperature(F): 98.7 Respiratory Rate(breaths/min): 17 Photos: [6:No Photos Right Upper Leg] [7:No Photos Left, Medial Upper Leg] [8:No Photos Left, Lateral Upper Leg] Wound Location: [6:Gradually Appeared] [7:Gradually  Appeared] [8:Gradually Appeared] Wounding Event: [6:Diabetic Wound/Ulcer of the Lower] [7:Diabetic Wound/Ulcer of the Lower] [8:Diabetic Wound/Ulcer of the Lower] Primary Etiology: [6:Extremity Cataracts, Glaucoma, Chronic sinus] [7:Extremity N/A] [8:Extremity Cataracts, Glaucoma, Chronic sinus] Comorbid History: [6:problems/congestion, Asthma, Hypertension, Type II Diabetes, Osteoarthritis, Neuropathy 11/06/2020] [7:01/06/2021] [8:problems/congestion, Asthma, Hypertension, Type II Diabetes, Osteoarthritis, Neuropathy 01/06/2021] Date Acquired: [6:8] [7:8] [8:8] Weeks of Treatment: [6:Open] [7:Healed - Epithelialized] [8:Open] Wound Status: [6:No] [7:Yes] [8:No] Clustered Wound: [6:2x2.5x1.4] [7:0x0x0] [8:12x8.4x6] Measurements L x W x D (cm) [6:3.927] [7:0] [8:79.168]  A (cm) : rea [6:5.498] [7:0] [8:475.009] Volume (cm) : [6:86.10%] [7:100.00%] [8:30.00%] % Reduction in A rea: [6:80.60%] [7:100.00%] [8:-68.00%] % Reduction in Volume: [8:12] Starting Position 1 (o'clock): [8:12] Ending Position 1 (o'clock): [8:2.5] Maximum Distance 1 (cm): [6:No] [7:N/A] [8:Yes] Undermining: [6:Grade 2] [7:Grade 2] [8:Grade 2] Classification: [6:Medium] [7:Medium] [8:Large] Exudate A mount: [6:Serosanguineous] [7:Serosanguineous] [8:Purulent] Exudate Type: [6:red, brown] [7:red, brown] [8:yellow, brown, green] Exudate Color: [6:No] [7:N/A] [8:Yes] Foul Odor A Cleansing: [6:fter N/A] [7:N/A] [8:No] Odor A nticipated Due to Product Use: [6:Flat and Intact] [7:N/A] [8:Distinct, outline attached] Wound Margin: [6:Large (67-100%)] [7:N/A] [8:Medium (34-66%)] Granulation A mount: [6:Red, Pink] [7:N/A] [8:Red] Granulation Quality: [6:None Present (0%)] [7:N/A] [8:Medium (34-66%)] Necrotic A mount: [6:Fat Layer (Subcutaneous Tissue): Yes N/A] [8:Fat Layer (Subcutaneous Tissue): Yes] Exposed Structures: [6:Fascia: No Tendon: No Muscle: No Joint: No Bone: No None] [7:N/A] [8:Muscle: Yes Fascia: No Tendon: No  Joint: No Bone: No None] Treatment Notes Wound #6 (Upper Leg) Wound Laterality: Right Cleanser Wound Cleanser Discharge Instruction: Cleanse the wound with wound cleanser prior to applying a clean dressing using gauze sponges, not tissue or cotton balls. Peri-Wound Care Ketoconazole Cream 2% Discharge Instruction: In office only*** patient to apply over the counter antifungal cream.***Apply Ketoconazole as directed Topical Primary Dressing KerraCel Ag Gelling Fiber Dressing, 4x5 in (silver alginate) Discharge Instruction: Apply silver alginate to wound bed as instructed Secondary Dressing Woven Gauze Sponge, Non-Sterile 4x4 in Discharge Instruction: Apply over primary dressing as directed. ABD Pad, 5x9 Discharge Instruction: Apply over primary dressing as directed. Secured With 21M Medipore H Soft Cloth Surgical T 4 x 2 (in/yd) ape Discharge Instruction: Secure dressing with tape as directed. Compression Wrap Compression Stockings Add-Ons Wound #7 (Upper Leg) Wound Laterality: Left, Medial Cleanser Peri-Wound Care Topical Primary Dressing Secondary Dressing Secured With Compression Wrap Compression Stockings Add-Ons Wound #8 (Upper Leg) Wound Laterality: Left, Lateral Cleanser Anasept Antimicrobial Skin and Wound Cleanser, 8 (oz) Discharge Instruction: Cleanse the wound with Anasept cleanser prior to applying a clean dressing using gauze sponges, not tissue or cotton balls. Peri-Wound Care Topical Primary Dressing Secondary Dressing Secured With Compression Wrap Compression Stockings Add-Ons Electronic Signature(s) Signed: 06/25/2021 4:52:35 PM By: Kalman Shan DO Signed: 06/25/2021 5:10:54 PM By: Rhae Hammock RN Entered By: Kalman Shan on 06/25/2021 15:46:18 -------------------------------------------------------------------------------- Multi-Disciplinary Care Plan Details Patient Name: Date of Service: [name], [name] 06/25/2021 2:30  PM Medical Record Number: [id number] Patient Account Number: [id number] Date of Birth/Sex: Treating RN: 1950/12/16 (70 y.o. [name], [name] Primary Care Dallys Nowakowski: [name] Other Clinician: Referring Majed Pellegrin: Treating Rosalita Carey/Extender: Alysia Penna in Treatment: 8 Active Inactive Wound/Skin Impairment Nursing Diagnoses: Impaired tissue integrity Knowledge deficit related to ulceration/compromised skin integrity Goals: Patient will demonstrate a reduced rate of smoking or cessation of smoking Date Initiated: 04/30/2021 Target Resolution Date: 07/03/2021 Goal Status: Active Patient will have a decrease in wound volume by X% from date: (specify in notes) Date Initiated: 04/30/2021 Target Resolution Date: 06/26/2021 Goal Status: Active Patient/caregiver will verbalize understanding of skin care regimen Date Initiated: 04/30/2021 Target Resolution Date: 07/10/2021 Goal Status: Active Interventions: Assess patient/caregiver ability to obtain necessary supplies Assess patient/caregiver ability to perform ulcer/skin care regimen upon admission and as needed Assess ulceration(s) every visit Notes: Electronic Signature(s) Signed: 06/25/2021 5:10:54 PM By: Rhae Hammock RN Entered By: Rhae Hammock on 06/25/2021 14:41:37 -------------------------------------------------------------------------------- Pain Assessment Details Patient Name: Date of Service: [name], [name] 06/25/2021 2:30 PM Medical Record Number: [id number] Patient Account Number:  [id number] Date of Birth/Sex: Treating RN: 07-22-1951 (70 y.o. [name], [name] Primary Care Analisse Randle: [name] Other Clinician: Referring Emmali Karow: Treating Davonta Stroot/Extender: Alysia Penna in Treatment: 8 Active Problems Location of Pain Severity and Description of Pain Patient Has Paino No Site Locations Pain Management and Medication Current Pain  Management: Electronic Signature(s) Signed: 06/25/2021 5:10:54 PM By: Rhae Hammock RN Entered By: Rhae Hammock on 06/25/2021 14:32:19 -------------------------------------------------------------------------------- Patient/Caregiver Education Details Patient Name: Date of Service: [name], [name] 8/18/2022andnbsp2:30 PM Medical Record Number: [id number] Patient Account Number: [id number] Date of Birth/Gender: Treating RN: [birth year] (70 y.o. [name], [name] Primary Care Physician: [name] Other Clinician: Referring Physician: Treating Physician/Extender: Alysia Penna in Treatment: 8 Education Assessment Education Provided To: Patient Education Topics Provided Wound/Skin Impairment: Methods: Explain/Verbal Responses: State content correctly Electronic Signature(s) Signed: 06/25/2021 5:10:54 PM By: Rhae Hammock RN Entered By: Rhae Hammock on 06/25/2021 14:41:49 -------------------------------------------------------------------------------- Wound Assessment Details Patient Name: Date of Service: [name], [name] 06/25/2021 2:30 PM Medical Record Number: [id number] Patient Account Number: [id number] Date of Birth/Sex: Treating RN: 10-21-1951 (70 y.o. [name], [name] Primary Care Brelynn Wheller: [name] Other Clinician: Referring Travas Schexnayder: Treating Carri Spillers/Extender: Alysia Penna in Treatment: 8 Wound Status Wound Number: 6 Primary Diabetic Wound/Ulcer of the Lower Extremity Etiology: Wound Location: Right Upper Leg Wound Open Wounding Event: Gradually Appeared Status: Date Acquired: 11/06/2020 Comorbid Cataracts, Glaucoma, Chronic sinus problems/congestion, Asthma, Weeks Of Treatment: 8 History: Hypertension, Type II Diabetes, Osteoarthritis, Neuropathy Clustered Wound: No Wound Measurements Length: (cm) 2 Width: (cm) 2.5 Depth: (cm) 1.4 Area: (cm) 3.927 Volume:  (cm) 5.498 % Reduction in Area: 86.1% % Reduction in Volume: 80.6% Epithelialization: None Tunneling: No Undermining: No Wound Description Classification: Grade 2 Wound Margin: Flat and Intact Exudate Amount: Medium Exudate Type: Serosanguineous Exudate Color: red, brown Foul Odor After Cleansing: No Slough/Fibrino No Wound Bed Granulation Amount: Large (67-100%) Exposed Structure Granulation Quality: Red, Pink Fascia Exposed: No Necrotic Amount: None Present (0%) Fat Layer (Subcutaneous Tissue) Exposed: Yes Tendon Exposed: No Muscle Exposed: No Joint Exposed: No Bone Exposed: No Treatment Notes Wound #6 (Upper Leg) Wound Laterality: Right Cleanser Wound Cleanser Discharge Instruction: Cleanse the wound with wound cleanser prior to applying a clean dressing using gauze sponges, not tissue or cotton balls. Peri-Wound Care Ketoconazole Cream 2% Discharge Instruction: In office only*** patient to apply over the counter antifungal cream.***Apply Ketoconazole as directed Topical Primary Dressing KerraCel Ag Gelling Fiber Dressing, 4x5 in (silver alginate) Discharge Instruction: Apply silver alginate to wound bed as instructed Secondary Dressing Woven Gauze Sponge, Non-Sterile 4x4 in Discharge Instruction: Apply over primary dressing as directed. ABD Pad, 5x9 Discharge Instruction: Apply over primary dressing as directed. Secured With 90M Medipore H Soft Cloth Surgical T 4 x 2 (in/yd) ape Discharge Instruction: Secure dressing with tape as directed. Compression Wrap Compression Stockings Add-Ons Electronic Signature(s) Signed: 06/25/2021 5:10:54 PM By: Rhae Hammock RN Entered By: Rhae Hammock on 06/25/2021 15:01:31 -------------------------------------------------------------------------------- Wound Assessment Details Patient Name: Date of Service: [name], [name] 06/25/2021 2:30 PM Medical Record Number: [id number] Patient Account Number:  [id number] Date of Birth/Sex: Treating RN: 10-08-1951 (70 y.o. [name], [name] Primary Care Cleon Signorelli: [name] Other Clinician: Referring Leo Fray: Treating Torrie Namba/Extender: Alysia Penna in Treatment: 8 Wound Status Wound Number: 7 Primary Etiology: Diabetic Wound/Ulcer of the Lower Extremity Wound Location: Left, Medial Upper Leg Wound Status: Healed - Epithelialized Wounding Event: Gradually  Appeared Date Acquired: 01/06/2021 Weeks Of Treatment: 8 Clustered Wound: Yes Wound Measurements Length: (cm) Width: (cm) Depth: (cm) Area: (cm) Volume: (cm) 0 % Reduction in Area: 100% 0 % Reduction in Volume: 100% 0 0 0 Wound Description Classification: Grade 2 Exudate Amount: Medium Exudate Type: Serosanguineous Exudate Color: red, brown Treatment Notes Wound #7 (Upper Leg) Wound Laterality: Left, Medial Cleanser Peri-Wound Care Topical Primary Dressing Secondary Dressing Secured With Compression Wrap Compression Stockings Add-Ons Electronic Signature(s) Signed: 06/25/2021 5:10:54 PM By: Rhae Hammock RN Entered By: Rhae Hammock on 06/25/2021 14:58:15 -------------------------------------------------------------------------------- Wound Assessment Details Patient Name: Date of Service: [name], [name] 06/25/2021 2:30 PM Medical Record Number: [id number] Patient Account Number: [id number] Date of Birth/Sex: Treating RN: 1951-05-25 (70 y.o. [name], [name] Primary Care Senaya Dicenso: [name] Other Clinician: Referring Donnalyn Juran: Treating Clelia Trabucco/Extender: Alysia Penna in Treatment: 8 Wound Status Wound Number: 8 Primary Diabetic Wound/Ulcer of the Lower Extremity Etiology: Wound Location: Left, Lateral Upper Leg Wound Open Wounding Event: Gradually Appeared Status: Date Acquired: 01/06/2021 Comorbid Cataracts, Glaucoma, Chronic sinus problems/congestion, Asthma, Weeks Of  Treatment: 8 History: Hypertension, Type II Diabetes, Osteoarthritis, Neuropathy Clustered Wound: No Wound Measurements Length: (cm) 12 Width: (cm) 8.4 Depth: (cm) 6 Area: (cm) 79.168 Volume: (cm) 475.009 % Reduction in Area: 30% % Reduction in Volume: -68% Epithelialization: None Tunneling: No Undermining: Yes Starting Position (o'clock): 12 Ending Position (o'clock): 12 Maximum Distance: (cm) 2.5 Wound Description Classification: Grade 2 Wound Margin: Distinct, outline attached Exudate Amount: Large Exudate Type: Purulent Exudate Color: yellow, brown, green Foul Odor After Cleansing: Yes Due to Product Use: No Slough/Fibrino Yes Wound Bed Granulation Amount: Medium (34-66%) Exposed Structure Granulation Quality: Red Fascia Exposed: No Necrotic Amount: Medium (34-66%) Fat Layer (Subcutaneous Tissue) Exposed: Yes Necrotic Quality: Adherent Slough Tendon Exposed: No Muscle Exposed: Yes Necrosis of Muscle: No Joint Exposed: No Bone Exposed: No Treatment Notes Wound #8 (Upper Leg) Wound Laterality: Left, Lateral Cleanser Anasept Antimicrobial Skin and Wound Cleanser, 8 (oz) Discharge Instruction: Cleanse the wound with Anasept cleanser prior to applying a clean dressing using gauze sponges, not tissue or cotton balls. Peri-Wound Care Topical Primary Dressing Secondary Dressing Secured With Compression Wrap Compression Stockings Add-Ons Electronic Signature(s) Signed: 06/25/2021 5:10:54 PM By: Rhae Hammock RN Entered By: Rhae Hammock on 06/25/2021 14:57:57 -------------------------------------------------------------------------------- Vitals Details Patient Name: Date of Service: [name], [name] 06/25/2021 2:30 PM Medical Record Number: [id number] Patient Account Number: [id number] Date of Birth/Sex: Treating RN: 10-15-1951 (70 y.o. [name], [name] Primary Care Emanuella Nickle: [name] Other Clinician: Referring Jaedin Trumbo: Treating  Mammie Meras/Extender: Alysia Penna in Treatment: 8 Vital Signs Time Taken: 14:33 Temperature (F): 98.7 Height (in): 63 Pulse (bpm): 74 Weight (lbs): 279 Respiratory Rate (breaths/min): 17 Body Mass Index (BMI): 49.4 Blood Pressure (mmHg): 120/74 Reference Range: 80 - 120 mg / dl Electronic Signature(s) Signed: 06/25/2021 5:10:54 PM By: Rhae Hammock RN Entered By: Rhae Hammock on 06/25/2021 14:39:40

## 2021-06-25 NOTE — Progress Notes (Signed)
Yesenia Payne, Yesenia Payne ([id number]) Visit Report for 06/25/2021 Chief Complaint Document Details Patient Name: Date of Service: [name], [name] 06/25/2021 2:30 PM Medical Record Number: [id number] Patient Account Number: [id number] Date of Birth/Sex: Treating RN: 1951/02/04 (70 y.o. [name], [name] Primary Care Provider: [name] Other Clinician: Referring Provider: Treating Provider/Extender: Alysia Penna in Treatment: 8 Information Obtained from: Patient Chief Complaint Left lower extremity wounds and right upper leg wound Electronic Signature(s) Signed: 06/25/2021 4:52:35 PM By: Kalman Shan DO Entered By: Kalman Shan on 06/25/2021 16:07:41 -------------------------------------------------------------------------------- HPI Details Patient Name: Date of Service: [name], [name] 06/25/2021 2:30 PM Medical Record Number: [id number] Patient Account Number: [id number] Date of Birth/Sex: Treating RN: December 24, 1950 (70 y.o. [name], [name] Primary Care Provider: [name] Other Clinician: Referring Provider: Treating Provider/Extender: Alysia Penna in Treatment: 8 History of Present Illness HPI Description: Admission 04/30/2021 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 She has been seen in our clinic multiple times last seen in 03/2019 for right toe wounds. Today she presents for 3 wounds. 1 is located to the right upper leg and 2 to the left lower extremity. They started at the end of December And not sure how I started. She had not been using any dressings up until 2 weeks ago. She is currently using silver alginate to the right upper leg and Santyl to the wounds on the left leg. She has been on doxycycline and 2 rounds of clindamycin for cellulitis to these  wounds. She finished her last round of antibiotics 3 weeks ago. She reports minimal pain to the wound. She currently denies signs of infection including increased erythema, warmth or purulent drainage. 7/5; patient presents for 1 week follow-up. She has been using Santyl to the left lower extremity wounds and silver alginate to the right upper leg wound. She denies signs of infection. She has no complaints today. 7/19; patient presents for 1 week follow-up. She has been using Santyl to the left lower extremity wounds and silver alginate to the right upper leg wound. She denies signs of infection. She has had to use a lot of Santyl to the lateral wound since the necrotic tissue has been breaking down and the wound is deeper. 8/4; patient presents for 2-week follow-up. She has been using Santyl to the left lower extremity wound and silver alginate to the right upper leg wound. She has been using the wound VAC to the left lateral wound. She has no issues or complaints today. She denies signs of infection. 8/18; patient presents for 2-week follow-up. She has been using Santyl and silver alginate to the right upper wound. She reports that this is deeper today. She has been using the wound VAC to the left lateral wound. She has no issues or complaints today. She denies signs of infection. She reports that the left lower extremity medial wound is healed Electronic Signature(s) Signed: 06/25/2021 4:52:35 PM By: Kalman Shan DO Entered By: Kalman Shan on 06/25/2021 16:08:07 -------------------------------------------------------------------------------- Physical Exam Details Patient Name: Date of Service: [name], [name] 06/25/2021 2:30 PM Medical Record Number: [id number] Patient Account Number: [id number] Date of Birth/Sex: Treating RN: [birth year] (70 y.o. [name], [name] Primary Care Provider: [name] Other Clinician: Referring Provider: Treating Provider/Extender: Alysia Penna in Treatment: 8 Constitutional respirations regular,  non-labored and within target range for patient.Marland Kitchen Psychiatric pleasant and cooperative. Notes Right lower extremity: Upper leg has an open wound with granulation tissue present, the color is a darker red. Left lower extremity: On the medial aspect epithelialization to previous wound site. Lateral side has a large open wound with nonviable tissue and granulation tissue present. Electronic Signature(s) Signed: 06/25/2021 4:52:35 PM By: Kalman Shan DO Entered By: Kalman Shan on 06/25/2021 16:09:45 -------------------------------------------------------------------------------- Physician Orders Details Patient Name: Date of Service: [name], [name] 06/25/2021 2:30 PM Medical Record Number: [id number] Patient Account Number: [id number] Date of Birth/Sex: Treating RN: 09/10/1951 (70 y.o. [name], [name] Primary Care Provider: [name] Other Clinician: Referring Provider: Treating Provider/Extender: Alysia Penna in Treatment: 8 Verbal / Phone Orders: No Diagnosis Coding ICD-10 Coding Code Description [phone] 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ity L97.819 Non-pressure chronic ulcer of other part of right lower leg with unspecified severity E11.9 Type 2 diabetes mellitus without complications Z00.923 Acquired absence of left leg below knee Follow-up Appointments ppointment in 2 weeks. - Dr. Heber [place] ***60 minutes extra time*** Return A Bathing/ Shower/ Hygiene Other Bathing/Shower/Hygiene Orders/Instructions: - You may sponge bathe Negative Presssure Wound Therapy Wound #8 Left,Lateral Upper Leg Wound Vac to wound continuously at 184m/hg pressure - change three times a week. Black Foam Off-Loading Turn and reposition every 2 hours Wound Treatment Wound #6 - Upper Leg Wound Laterality: Right Cleanser:  Wound Cleanser (Generic) 1 x Per Day/15 Days Discharge Instructions: Cleanse the wound with wound cleanser prior to applying a clean dressing using gauze sponges, not tissue or cotton balls. Peri-Wound Care: Ketoconazole Cream 2% 1 x Per Day/15 Days Discharge Instructions: In office only*** patient to apply over the counter antifungal cream.***Apply Ketoconazole as directed Prim Dressing: KerraCel Ag Gelling Fiber Dressing, 4x5 in (silver alginate) (Generic) 1 x Per Day/15 Days ary Discharge Instructions: Apply silver alginate to wound bed as instructed Secondary Dressing: Woven Gauze Sponge, Non-Sterile 4x4 in (Generic) 1 x Per Day/15 Days Discharge Instructions: Apply over primary dressing as directed. Secondary Dressing: ABD Pad, 5x9 (Generic) 1 x Per Day/15 Days Discharge Instructions: Apply over primary dressing as directed. Secured With: 34M Medipore H Soft Cloth Surgical T 4 x 2 (in/yd) (Generic) 1 x Per Day/15 Days ape Discharge Instructions: Secure dressing with tape as directed. Wound #8 - Upper Leg Wound Laterality: Left, Lateral Cleanser: Anasept Antimicrobial Skin and Wound Cleanser, 8 (oz) 3 x Per Week/15 Days Discharge Instructions: Cleanse the wound with Anasept cleanser prior to applying a clean dressing using gauze sponges, not tissue or cotton balls. Electronic Signature(s) Signed: 06/25/2021 4:52:35 PM By: HKalman ShanDO Entered By: HKalman Shanon 06/25/2021 16:10:05 -------------------------------------------------------------------------------- Problem List Details Patient Name: Date of Service: MO RJoya San [name] 06/25/2021 2:30 PM Medical Record Number: [id number] Account Number: [id number] of Birth/Sex: Treating RN: 1December 29, 1952(70y.o. FTonita Phoenix [name] Primary Care Provider: AClovia CuffOther Clinician: Referring Provider: Treating Provider/Extender: HAlysia Pennain Treatment: 8 Active  Problems ICD-10 Encounter Code Description Active Date MDM Diagnosis L97.829 Non-pressure chronic ulcer of other part of left lower leg with unspecified 04/30/2021 No Yes severity L97.819 Non-pressure chronic ulcer of other part of right lower leg with unspecified 04/30/2021 No Yes severity E11.9 Type 2 diabetes mellitus without complications 63/35/4562No Yes Z89.512 Acquired absence of left leg below knee 04/30/2021 No Yes Inactive Problems Resolved Problems Electronic Signature(s) Signed:  06/25/2021 4:52:35 PM By: Kalman Shan DO Entered By: Kalman Shan on 06/25/2021 15:46:11 -------------------------------------------------------------------------------- Progress Note Details Patient Name: Date of Service: [name], [name] 06/25/2021 2:30 PM Medical Record Number: [id number] Patient Account Number: [id number] Date of Birth/Sex: Treating RN: May 24, 1951 (70 y.o. [name], [name] Primary Care Provider: [name] Other Clinician: Referring Provider: Treating Provider/Extender: Alysia Penna in Treatment: 8 Subjective Chief Complaint Information obtained from Patient Left lower extremity wounds and right upper leg wound History of Present Illness (HPI) Admission 04/30/2021 Ms. 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 She has been seen in our clinic multiple times last seen in 03/2019 for right toe wounds. Today she presents for 3 wounds. 1 is located to the right upper leg and 2 to the left lower extremity. They started at the end of December And not sure how I started. She had not been using any dressings up until 2 weeks ago. She is currently using silver alginate to the right upper leg and Santyl to the wounds on the left leg. She has been on doxycycline and 2 rounds of clindamycin for cellulitis to  these wounds. She finished her last round of antibiotics 3 weeks ago. She reports minimal pain to the wound. She currently denies signs of infection including increased erythema, warmth or purulent drainage. 7/5; patient presents for 1 week follow-up. She has been using Santyl to the left lower extremity wounds and silver alginate to the right upper leg wound. She denies signs of infection. She has no complaints today. 7/19; patient presents for 1 week follow-up. She has been using Santyl to the left lower extremity wounds and silver alginate to the right upper leg wound. She denies signs of infection. She has had to use a lot of Santyl to the lateral wound since the necrotic tissue has been breaking down and the wound is deeper. 8/4; patient presents for 2-week follow-up. She has been using Santyl to the left lower extremity wound and silver alginate to the right upper leg wound. She has been using the wound VAC to the left lateral wound. She has no issues or complaints today. She denies signs of infection. 8/18; patient presents for 2-week follow-up. She has been using Santyl and silver alginate to the right upper wound. She reports that this is deeper today. She has been using the wound VAC to the left lateral wound. She has no issues or complaints today. She denies signs of infection. She reports that the left lower extremity medial wound is healed Patient History Information obtained from Patient. Family History Cancer - Child, Diabetes - Father, Heart Disease - Mother,Father, Hypertension - Mother,Father, No family history of Hereditary Spherocytosis, Kidney Disease, Lung Disease, Seizures, Stroke, Thyroid Problems, Tuberculosis. Social History Never smoker, Marital Status - Married, Alcohol Use - Never, Drug Use - No History, Caffeine Use - Daily - coffee , sodas. Medical History Eyes Patient has history of Cataracts - lens implant done, Glaucoma Denies history of Optic  Neuritis Ear/Nose/Mouth/Throat Patient has history of Chronic sinus problems/congestion - seasonal Denies history of Middle ear problems Hematologic/Lymphatic Denies history of Anemia, Hemophilia, Human Immunodeficiency Virus, Lymphedema, Sickle Cell Disease Respiratory Patient has history of Asthma Denies history of Aspiration, Chronic Obstructive Pulmonary Disease (COPD), Pneumothorax, Sleep Apnea, Tuberculosis Cardiovascular Patient has history of Hypertension - not on meds presently Denies history of Angina, Arrhythmia, Congestive  Heart Failure, Coronary Artery Disease, Deep Vein Thrombosis, Hypotension, Myocardial Infarction, Peripheral Arterial Disease, Peripheral Venous Disease, Phlebitis, Vasculitis Gastrointestinal Denies history of Cirrhosis , Colitis, Crohnoos, Hepatitis A, Hepatitis B, Hepatitis C Endocrine Patient has history of Type II Diabetes Denies history of Type I Diabetes Genitourinary Denies history of End Stage Renal Disease Immunological Denies history of Lupus Erythematosus, Raynaudoos, Scleroderma Integumentary (Skin) Denies history of History of Burn Musculoskeletal Patient has history of Osteoarthritis Denies history of Gout, Rheumatoid Arthritis, Osteomyelitis Neurologic Patient has history of Neuropathy Denies history of Dementia, Quadriplegia, Paraplegia, Seizure Disorder Oncologic Denies history of Received Chemotherapy, Received Radiation Psychiatric Denies history of Anorexia/bulimia, Confinement Anxiety Medical A Surgical History Notes nd Cardiovascular Hyperlipidemia Gastrointestinal GERD, Diverticulitis Genitourinary Overactive Bladder Musculoskeletal Fibromyalgia, toe amputations x 3 Psychiatric Anxiety Objective Constitutional respirations regular, non-labored and within target range for patient.. Vitals Time Taken: 2:33 PM, Height: 63 in, Weight: 279 lbs, BMI: 49.4, Temperature: 98.7 F, Pulse: 74 bpm, Respiratory Rate: 17  breaths/min, Blood Pressure: 120/74 mmHg. Psychiatric pleasant and cooperative. General Notes: Right lower extremity: Upper leg has an open wound with granulation tissue present, the color is a darker red. Left lower extremity: On the medial aspect epithelialization to previous wound site. Lateral side has a large open wound with nonviable tissue and granulation tissue present. Integumentary (Hair, Skin) Wound #6 status is Open. Original cause of wound was Gradually Appeared. The date acquired was: 11/06/2020. The wound has been in treatment 8 weeks. The wound is located on the Right Upper Leg. The wound measures 2cm length x 2.5cm width x 1.4cm depth; 3.927cm^2 area and 5.498cm^3 volume. There is Fat Layer (Subcutaneous Tissue) exposed. There is no tunneling or undermining noted. There is a medium amount of serosanguineous drainage noted. The wound margin is flat and intact. There is large (67-100%) red, pink granulation within the wound bed. There is no necrotic tissue within the wound bed. Wound #7 status is Healed - Epithelialized. Original cause of wound was Gradually Appeared. The date acquired was: 01/06/2021. The wound has been in treatment 8 weeks. The wound is located on the Left,Medial Upper Leg. The wound measures 0cm length x 0cm width x 0cm depth; 0cm^2 area and 0cm^3 volume. There is a medium amount of serosanguineous drainage noted. Wound #8 status is Open. Original cause of wound was Gradually Appeared. The date acquired was: 01/06/2021. The wound has been in treatment 8 weeks. The wound is located on the Left,Lateral Upper Leg. The wound measures 12cm length x 8.4cm width x 6cm depth; 79.168cm^2 area and 475.009cm^3 volume. There is muscle and Fat Layer (Subcutaneous Tissue) exposed. There is no tunneling noted, however, there is undermining starting at 12:00 and ending at 12:00 with a maximum distance of 2.5cm. There is a large amount of purulent drainage noted. Foul odor after  cleansing was noted. The wound margin is distinct with the outline attached to the wound base. There is medium (34-66%) red granulation within the wound bed. There is a medium (34-66%) amount of necrotic tissue within the wound bed including Adherent Slough. Assessment Active Problems ICD-10 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ity Non-pressure chronic ulcer of other part of right lower leg with unspecified severity Type 2 diabetes mellitus without complications Acquired absence of left leg below knee Patient's wounds are overall stable. Her left lateral wound has nonviable tissue and granulation tissue present. No signs of infection on exam. I recommended continuing the wound VAC to this area. The  right lower extremity wound is slightly deeper with a more dark red appearance. No surrounding soft tissue infection. She may benefit from topical antibiotics to this but I would like it targeted to a specific bacteria. I obtained a superficial wound culture. I recommended continuing Santyl and silver alginate to this area. Left medial wound is healed. Plan Follow-up Appointments: Return Appointment in 2 weeks. - Dr. Heber [place] ***60 minutes extra time*** Bathing/ Shower/ Hygiene: Other Bathing/Shower/Hygiene Orders/Instructions: - You may sponge bathe Negative Presssure Wound Therapy: Wound #8 Left,Lateral Upper Leg: Wound Vac to wound continuously at 118m/hg pressure - change three times a week. Black Foam Off-Loading: Turn and reposition every 2 hours WOUND #6: - Upper Leg Wound Laterality: Right Cleanser: Wound Cleanser (Generic) 1 x Per Day/15 Days Discharge Instructions: Cleanse the wound with wound cleanser prior to applying a clean dressing using gauze sponges, not tissue or cotton balls. Peri-Wound Care: Ketoconazole Cream 2% 1 x Per Day/15 Days Discharge Instructions: In office only*** patient to apply over the counter antifungal  cream.***Apply Ketoconazole as directed Prim Dressing: KerraCel Ag Gelling Fiber Dressing, 4x5 in (silver alginate) (Generic) 1 x Per Day/15 Days ary Discharge Instructions: Apply silver alginate to wound bed as instructed Secondary Dressing: Woven Gauze Sponge, Non-Sterile 4x4 in (Generic) 1 x Per Day/15 Days Discharge Instructions: Apply over primary dressing as directed. Secondary Dressing: ABD Pad, 5x9 (Generic) 1 x Per Day/15 Days Discharge Instructions: Apply over primary dressing as directed. Secured With: 60M Medipore H Soft Cloth Surgical T 4 x 2 (in/yd) (Generic) 1 x Per Day/15 Days ape Discharge Instructions: Secure dressing with tape as directed. WOUND #8: - Upper Leg Wound Laterality: Left, Lateral Cleanser: Anasept Antimicrobial Skin and Wound Cleanser, 8 (oz) 3 x Per Week/15 Days Discharge Instructions: Cleanse the wound with Anasept cleanser prior to applying a clean dressing using gauze sponges, not tissue or cotton balls. 1. Continue wound VAC to the left lateral wound 2. Santyl and silver alginate to the upper right wound lower extremity wound 3. Wound culture 4. Follow-up in 2 weeks Electronic Signature(s) Signed: 06/25/2021 4:52:35 PM By: HKalman ShanDO Entered By: HKalman Shanon 06/25/2021 16:50:59 -------------------------------------------------------------------------------- HxROS Details Patient Name: Date of Service: [place] RJoya San [name] 06/25/2021 2:30 PM Medical Record Number: [id number] Account Number: [id number] of Birth/Sex: Treating RN: 1Feb 22, 1952(70y.o. FTonita Phoenix [name] Primary Care Provider: AClovia CuffOther Clinician: Referring Provider: Treating Provider/Extender: HAlysia Pennain Treatment: 8 Information Obtained From Patient Eyes Medical History: Positive for: Cataracts - lens implant done; Glaucoma Negative for: Optic Neuritis Ear/Nose/Mouth/Throat Medical History: Positive for:  Chronic sinus problems/congestion - seasonal Negative for: Middle ear problems Hematologic/Lymphatic Medical History: Negative for: Anemia; Hemophilia; Human Immunodeficiency Virus; Lymphedema; Sickle Cell Disease Respiratory Medical History: Positive for: Asthma Negative for: Aspiration; Chronic Obstructive Pulmonary Disease (COPD); Pneumothorax; Sleep Apnea; Tuberculosis Cardiovascular Medical History: Positive for: Hypertension - not on meds presently Negative for: Angina; Arrhythmia; Congestive Heart Failure; Coronary Artery Disease; Deep Vein Thrombosis; Hypotension; Myocardial Infarction; Peripheral Arterial Disease; Peripheral Venous Disease; Phlebitis; Vasculitis Past Medical History Notes: Hyperlipidemia Gastrointestinal Medical History: Negative for: Cirrhosis ; Colitis; Crohns; Hepatitis A; Hepatitis B; Hepatitis C Past Medical History Notes: GERD, Diverticulitis Endocrine Medical History: Positive for: Type II Diabetes Negative for: Type I Diabetes Time with diabetes: 12 years Treated with: Insulin Blood sugar tested every day: Yes T ested : bid Blood sugar testing results: Breakfast: 140 max; Bedtime: 220's max Genitourinary Medical History: Negative for:  End Stage Renal Disease Past Medical History Notes: Overactive Bladder Immunological Medical History: Negative for: Lupus Erythematosus; Raynauds; Scleroderma Integumentary (Skin) Medical History: Negative for: History of Burn Musculoskeletal Medical History: Positive for: Osteoarthritis Negative for: Gout; Rheumatoid Arthritis; Osteomyelitis Past Medical History Notes: Fibromyalgia, toe amputations x 3 Neurologic Medical History: Positive for: Neuropathy Negative for: Dementia; Quadriplegia; Paraplegia; Seizure Disorder Oncologic Medical History: Negative for: Received Chemotherapy; Received Radiation Psychiatric Medical History: Negative for: Anorexia/bulimia; Confinement Anxiety Past Medical  History Notes: Anxiety HBO Extended History Items Ear/Nose/Mouth/Throat: Eyes: Eyes: Chronic sinus Cataracts Glaucoma problems/congestion Immunizations Pneumococcal Vaccine: Received Pneumococcal Vaccination: Yes Received Pneumococcal Vaccination On or After 60th Birthday: No Immunization Notes: up to date on pneumonia and tetanus shots Implantable Devices None Family and Social History Cancer: Yes - Child; Diabetes: Yes - Father; Heart Disease: Yes - Mother,Father; Hereditary Spherocytosis: No; Hypertension: Yes - Mother,Father; Kidney Disease: No; Lung Disease: No; Seizures: No; Stroke: No; Thyroid Problems: No; Tuberculosis: No; Never smoker; Marital Status - Married; Alcohol Use: Never; Drug Use: No History; Caffeine Use: Daily - coffee , sodas; Financial Concerns: No; Food, Clothing or Shelter Needs: No; Support System Lacking: No; Transportation Concerns: No Electronic Signature(s) Signed: 06/25/2021 4:52:35 PM By: Kalman Shan DO Signed: 06/25/2021 5:10:54 PM By: Rhae Hammock RN Entered By: Kalman Shan on 06/25/2021 16:08:19 -------------------------------------------------------------------------------- SuperBill Details Patient Name: Date of Service: [name], [name] 06/25/2021 Medical Record Number: [id number] Patient Account Number: [id number] Date of Birth/Sex: Treating RN: Nov 13, 1950 (70 y.o. [name], [name] Primary Care Provider: [name] Other Clinician: Referring Provider: Treating Provider/Extender: Alysia Penna in Treatment: 8 Diagnosis Coding ICD-10 Codes Code Description [phone] 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ity L97.819 Non-pressure chronic ulcer of other part of right lower leg with unspecified severity E11.9 Type 2 diabetes mellitus without complications Z02.585 Acquired absence of left leg below knee Facility Procedures CPT4 Code:  [id number] Description: [phone] - WOUND CARE VISIT-LEV 5 EST PT Modifier: Quantity: 1 Physician Procedures : CPT4 Code Description Modifier [id number] - WC PHYS LEVEL 3 - EST PT ICD-10 Diagnosis Description 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ity L97.819 Non-pressure chronic ulcer of other part of right  lower leg with unspecified severity E11.9 Type 2 diabetes mellitus without complications Q67.619 Acquired absence of left leg below knee Quantity: 1 Electronic Signature(s) Signed: 06/25/2021 4:52:35 PM By: Kalman Shan DO Entered By: Kalman Shan on 06/25/2021 16:51:14

## 2021-06-26 ENCOUNTER — Other Ambulatory Visit (HOSPITAL_COMMUNITY)
Admit: 2021-06-26 | Discharge: 2021-06-26 | Disposition: A | Discharge status: Home / Self Care | Payer: Medicare Other | Source: Ambulatory Visit | Attending: Internal Medicine | Admitting: Internal Medicine

## 2021-06-26 DIAGNOSIS — L97819 Non-pressure chronic ulcer of other part of right lower leg with unspecified severity: Secondary | ICD-10-CM | POA: Insufficient documentation

## 2021-06-28 DIAGNOSIS — L97829 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ity: Secondary | ICD-10-CM | POA: Diagnosis not present

## 2021-06-28 DIAGNOSIS — E1169 Type 2 diabetes mellitus with other specified complication: Secondary | ICD-10-CM | POA: Diagnosis not present

## 2021-06-28 LAB — AEROBIC CULTURE W GRAM STAIN (SUPERFICIAL SPECIMEN)

## 2021-07-07 DIAGNOSIS — L8990 Pressure ulcer of unspecified site, unspecified stage: Secondary | ICD-10-CM | POA: Diagnosis not present

## 2021-07-07 DIAGNOSIS — L02612 Cutaneous abscess of left foot: Secondary | ICD-10-CM | POA: Diagnosis not present

## 2021-07-09 ENCOUNTER — Other Ambulatory Visit (HOSPITAL_COMMUNITY): Payer: Self-pay | Admitting: Internal Medicine

## 2021-07-09 ENCOUNTER — Other Ambulatory Visit: Payer: Self-pay | Admitting: Internal Medicine

## 2021-07-09 ENCOUNTER — Other Ambulatory Visit: Payer: Self-pay

## 2021-07-09 ENCOUNTER — Encounter (HOSPITAL_BASED_OUTPATIENT_CLINIC_OR_DEPARTMENT_OTHER): Payer: Medicare Other | Attending: Internal Medicine | Admitting: Internal Medicine

## 2021-07-09 DIAGNOSIS — I10 Essential (primary) hypertension: Secondary | ICD-10-CM | POA: Insufficient documentation

## 2021-07-09 DIAGNOSIS — E1142 Type 2 diabetes mellitus with diabetic polyneuropathy: Secondary | ICD-10-CM | POA: Insufficient documentation

## 2021-07-09 DIAGNOSIS — L97822 Non-pressure chronic ulcer of other part of left lower leg with fat layer exposed: Secondary | ICD-10-CM | POA: Insufficient documentation

## 2021-07-09 DIAGNOSIS — L97819 Non-pressure chronic ulcer of other part of right lower leg with unspecified severity: Secondary | ICD-10-CM | POA: Diagnosis not present

## 2021-07-09 DIAGNOSIS — L97829 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ity: Secondary | ICD-10-CM

## 2021-07-09 DIAGNOSIS — Z89512 Acquired absence of left leg below knee: Secondary | ICD-10-CM | POA: Insufficient documentation

## 2021-07-09 DIAGNOSIS — L97812 Non-pressure chronic ulcer of other part of right lower leg with fat layer exposed: Secondary | ICD-10-CM | POA: Diagnosis not present

## 2021-07-09 DIAGNOSIS — E119 Type 2 diabetes mellitus without complications: Secondary | ICD-10-CM

## 2021-07-09 DIAGNOSIS — E1169 Type 2 diabetes mellitus with other specified complication: Secondary | ICD-10-CM | POA: Diagnosis not present

## 2021-07-09 DIAGNOSIS — R531 Weakness: Secondary | ICD-10-CM | POA: Diagnosis not present

## 2021-07-09 DIAGNOSIS — Z7401 Bed confinement status: Secondary | ICD-10-CM | POA: Diagnosis not present

## 2021-07-09 DIAGNOSIS — S81802A Unspecified open wound, left lower leg, initial encounter: Secondary | ICD-10-CM

## 2021-07-09 DIAGNOSIS — Z743 Need for continuous supervision: Secondary | ICD-10-CM | POA: Diagnosis not present

## 2021-07-09 DIAGNOSIS — E11622 Type 2 diabetes mellitus with other skin ulcer: Secondary | ICD-10-CM | POA: Insufficient documentation

## 2021-07-09 NOTE — Progress Notes (Addendum)
Yesenia Payne ([id number]) Visit Report for 07/09/2021 Chief Complaint Document Details Patient Name: Date of Service: Yesenia Payne, [name] 07/09/2021 1:30 PM Medical Record Number: [id number] Patient Account Number: [id number] Date of Birth/Sex: Treating RN: 08-13-1951 (70 y.o. [name], [name] Primary Care Provider: [name] Other Clinician: Referring Provider: Treating Provider/Extender: Alysia Penna in Treatment: 10 Information Obtained from: Patient Chief Complaint Left lower extremity wounds and right upper leg wound Electronic Signature(s) Signed: 07/09/2021 2:43:56 PM By: Kalman Shan DO Entered By: Kalman Shan on 07/09/2021 14:37:41 -------------------------------------------------------------------------------- HPI Details Patient Name: Date of Service: Yesenia Yesenia San, [name] 07/09/2021 1:30 PM Medical Record Number: [id number] Patient Account Number: [id number] Date of Birth/Sex: Treating RN: October 12, 1951 (70 y.o. [name], [name] Primary Care Provider: [name] Other Clinician: Referring Provider: Treating Provider/Extender: Alysia Penna in Treatment: 10 History of Present Illness HPI Description: Admission 04/30/2021 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 She has been seen in our clinic multiple times last seen in 03/2019 for right toe wounds. Today she presents for 3 wounds. 1 is located to the right upper leg and 2 to the left lower extremity. They started at the end of December And not sure how I started. She had not been using any dressings up until 2 weeks ago. She is currently using silver alginate to the right upper leg and Santyl to the wounds on the left leg. She has been on doxycycline and 2 rounds of clindamycin for cellulitis to these  wounds. She finished her last round of antibiotics 3 weeks ago. She reports minimal pain to the wound. She currently denies signs of infection including increased erythema, warmth or purulent drainage. 7/5; patient presents for 1 week follow-up. She has been using Santyl to the left lower extremity wounds and silver alginate to the right upper leg wound. She denies signs of infection. She has no complaints today. 7/19; patient presents for 1 week follow-up. She has been using Santyl to the left lower extremity wounds and silver alginate to the right upper leg wound. She denies signs of infection. She has had to use a lot of Santyl to the lateral wound since the necrotic tissue has been breaking down and the wound is deeper. 8/4; patient presents for 2-week follow-up. She has been using Santyl to the left lower extremity wound and silver alginate to the right upper leg wound. She has been using the wound VAC to the left lateral wound. She has no issues or complaints today. She denies signs of infection. 8/18; patient presents for 2-week follow-up. She has been using Santyl and silver alginate to the right upper wound. She reports that this is deeper today. She has been using the wound VAC to the left lateral wound. She has no issues or complaints today. She denies signs of infection. She reports that the left lower extremity medial wound is healed 9/1; patient presents for 2-week follow-up. She continues to use silver alginate to the right upper wound. She is using the wound VAC to the left lateral wound. She has no issues today. She denies signs of infection. She overall feels well. Electronic Signature(s) Signed: 07/09/2021 2:43:56 PM By: Kalman Shan DO Entered By: Kalman Shan on 07/09/2021 14:38:17 -------------------------------------------------------------------------------- Physical Exam Details Patient Name: Date of Service: Yesenia RTO N, [name] 07/09/2021 1:30 PM  Medical Record  Number: [id number] Patient Account Number: [id number] Date of Birth/Sex: Treating RN: [birth year] (70 y.o. [name], [name] Primary Care Provider: [name] Other Clinician: Referring Provider: Treating Provider/Extender: Alysia Penna in Treatment: 10 Constitutional respirations regular, non-labored and within target range for patient.Marland Kitchen Psychiatric pleasant and cooperative. Notes Right lower extremity: Upper leg has an open wound with granulation tissue present, Tissue appears healthy. Left lower extremity: Lateral side has a large open wound with nonviable tissue and granulation tissue present. Some thickened yellow drainage when palpating the most proximal and distal aspects. Electronic Signature(s) Signed: 07/09/2021 2:43:56 PM By: Kalman Shan DO Entered By: Kalman Shan on 07/09/2021 14:39:46 -------------------------------------------------------------------------------- Physician Orders Details Patient Name: Date of Service: Yesenia Yesenia San, [name] 07/09/2021 1:30 PM Medical Record Number: [id number] Patient Account Number: [id number] Date of Birth/Sex: Treating RN: [birth year] (70 y.o. [name] Primary Care Provider: [name] Other Clinician: Referring Provider: Treating Provider/Extender: Alysia Penna in Treatment: 10 Verbal / Phone Orders: No Diagnosis Coding ICD-10 Coding Code Description [phone] 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ity L97.819 Non-pressure chronic ulcer of other part of right lower leg with unspecified severity E11.9 Type 2 diabetes mellitus without complications H74.163 Acquired absence of left leg below knee Follow-up Appointments ppointment in 2 weeks. - Dr. Heber [place] ***60 minutes extra time*** Return A Bathing/ Shower/ Hygiene Other Bathing/Shower/Hygiene Orders/Instructions: - You may sponge bathe Negative Presssure Wound  Therapy Wound #8 Left,Lateral Upper Leg Wound Vac to wound continuously at 139m/hg pressure - change three times a week. Black Foam Off-Loading Turn and reposition every 2 hours Wound Treatment Wound #6 - Upper Leg Wound Laterality: Right Cleanser: Wound Cleanser (Generic) 1 x Per Day/15 Days Discharge Instructions: Cleanse the wound with wound cleanser prior to applying a clean dressing using gauze sponges, not tissue or cotton balls. Peri-Wound Care: Ketoconazole Cream 2% 1 x Per Day/15 Days Discharge Instructions: In office only*** patient to apply over the counter antifungal cream.***Apply Ketoconazole as directed Prim Dressing: KerraCel Ag Gelling Fiber Dressing, 4x5 in (silver alginate) (Generic) 1 x Per Day/15 Days ary Discharge Instructions: Apply silver alginate to wound bed as instructed Secondary Dressing: Woven Gauze Sponge, Non-Sterile 4x4 in (Generic) 1 x Per Day/15 Days Discharge Instructions: Apply over primary dressing as directed. Secondary Dressing: ABD Pad, 5x9 (Generic) 1 x Per Day/15 Days Discharge Instructions: Apply over primary dressing as directed. Secured With: 37M Medipore H Soft Cloth Surgical T 4 x 2 (in/yd) (Generic) 1 x Per Day/15 Days ape Discharge Instructions: Secure dressing with tape as directed. Wound #8 - Upper Leg Wound Laterality: Left, Lateral Cleanser: Anasept Antimicrobial Skin and Wound Cleanser, 8 (oz) 3 x Per Week/15 Days Discharge Instructions: Cleanse the wound with Anasept cleanser prior to applying a clean dressing using gauze sponges, not tissue or cotton balls. [employer] Ultrasound of Left Upper Leg - Chronic Upper Leg Wound, rule out abscess [id number] (ICD10 L97.829 - 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ity) Electronic Signature(s) Signed: 07/09/2021 4:37:51 PM By: BLorrin JacksonSigned: 07/10/2021 8:44:03 AM By: HKalman ShanDO Previous Signature: 07/09/2021 2:43:56 PM Version By:  HKalman ShanDO Entered By: BLorrin Jacksonon 07/09/2021 16:27:05 Prescription 07/09/2021 -------------------------------------------------------------------------------- MHyacinth MeekerD. HKalman ShanDO Patient Name: Provider: [id number] of Birth: NPI#: [id number]: DEA #: [id number] #: License #: [place]  Address: [address] [place], [place] [address] [place], [place] [address] [phone] Allergies Iodinated Contrast- Oral and IV Dye Provider's Orders Ultrasound of Left Upper Leg - ICD10: L97.829 - Chronic Upper Leg Wound, rule out abscess [id number] Hand Signature: Date(s): Electronic Signature(s) Signed: 07/09/2021 4:37:51 PM By: Lorrin Jackson Signed: 07/10/2021 8:44:03 AM By: Kalman Shan DO Previous Signature: 07/09/2021 2:43:56 PM Version By: Kalman Shan DO Entered By: Lorrin Jackson on 07/09/2021 16:27:05 -------------------------------------------------------------------------------- Problem List Details Patient Name: Date of Service: Yesenia Yesenia San, [name] 07/09/2021 1:30 PM Medical Record Number: [id number] Patient Account Number: [id number] Date of Birth/Sex: Treating RN: 1951/02/26 (70 y.o. [name] Primary Care Provider: [name] Other Clinician: Referring Provider: Treating Provider/Extender: Alysia Penna in Treatment: 10 Active Problems ICD-10 Encounter Code Description Active Date MDM Diagnosis L97.829 Non-pressure chronic ulcer of other part of left lower leg with unspecified 04/30/2021 No Yes severity L97.819 Non-pressure chronic ulcer of other part of right lower leg with unspecified 04/30/2021 No Yes severity E11.9 Type 2 diabetes mellitus without complications 01/03/3334 No Yes Z89.512 Acquired absence of left leg below knee 04/30/2021 No Yes Inactive Problems Resolved  Problems Electronic Signature(s) Signed: 07/09/2021 2:43:56 PM By: Kalman Shan DO Entered By: Kalman Shan on 07/09/2021 14:37:23 -------------------------------------------------------------------------------- Progress Note Details Patient Name: Date of Service: Yesenia Yesenia San, [name] 07/09/2021 1:30 PM Medical Record Number: [id number] Patient Account Number: [id number] Date of Birth/Sex: Treating RN: 1951-10-06 (70 y.o. [name], [name] Primary Care Provider: [name] Other Clinician: Referring Provider: Treating Provider/Extender: Alysia Penna in Treatment: 10 Subjective Chief Complaint Information obtained from Patient Left lower extremity wounds and right upper leg wound History of Present Illness (HPI) Admission 04/30/2021 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 She has been seen in our clinic multiple times last seen in 03/2019 for right toe wounds. Today she presents for 3 wounds. 1 is located to the right upper leg and 2 to the left lower extremity. They started at the end of December And not sure how I started. She had not been using any dressings up until 2 weeks ago. She is currently using silver alginate to the right upper leg and Santyl to the wounds on the left leg. She has been on doxycycline and 2 rounds of clindamycin for cellulitis to these wounds. She finished her last round of antibiotics 3 weeks ago. She reports minimal pain to the wound. She currently denies signs of infection including increased erythema, warmth or purulent drainage. 7/5; patient presents for 1 week follow-up. She has been using Santyl to the left lower extremity wounds and silver alginate to the right upper leg wound. She denies signs of infection. She has no complaints today. 7/19; patient presents for 1 week  follow-up. She has been using Santyl to the left lower extremity wounds and silver alginate to the right upper leg wound. She denies signs of infection. She has had to use a lot of Santyl to the lateral wound since the necrotic tissue has been breaking down and the wound is deeper. 8/4; patient presents for 2-week follow-up. She has been using Santyl to the left lower extremity wound and silver alginate to the right upper leg wound. She has been using the wound VAC to the left lateral wound. She has no issues  or complaints today. She denies signs of infection. 8/18; patient presents for 2-week follow-up. She has been using Santyl and silver alginate to the right upper wound. She reports that this is deeper today. She has been using the wound VAC to the left lateral wound. She has no issues or complaints today. She denies signs of infection. She reports that the left lower extremity medial wound is healed 9/1; patient presents for 2-week follow-up. She continues to use silver alginate to the right upper wound. She is using the wound VAC to the left lateral wound. She has no issues today. She denies signs of infection. She overall feels well. Patient History Information obtained from Patient. Family History Cancer - Child, Diabetes - Father, Heart Disease - Mother,Father, Hypertension - Mother,Father, No family history of Hereditary Spherocytosis, Kidney Disease, Lung Disease, Seizures, Stroke, Thyroid Problems, Tuberculosis. Social History Never smoker, Marital Status - Married, Alcohol Use - Never, Drug Use - No History, Caffeine Use - Daily - coffee , sodas. Medical History Eyes Patient has history of Cataracts - lens implant done, Glaucoma Denies history of Optic Neuritis Ear/Nose/Mouth/Throat Patient has history of Chronic sinus problems/congestion - seasonal Denies history of Middle ear problems Hematologic/Lymphatic Denies history of Anemia, Hemophilia, Human Immunodeficiency Virus,  Lymphedema, Sickle Cell Disease Respiratory Patient has history of Asthma Denies history of Aspiration, Chronic Obstructive Pulmonary Disease (COPD), Pneumothorax, Sleep Apnea, Tuberculosis Cardiovascular Patient has history of Hypertension - not on meds presently Denies history of Angina, Arrhythmia, Congestive Heart Failure, Coronary Artery Disease, Deep Vein Thrombosis, Hypotension, Myocardial Infarction, Peripheral Arterial Disease, Peripheral Venous Disease, Phlebitis, Vasculitis Gastrointestinal Denies history of Cirrhosis , Colitis, Crohnoos, Hepatitis A, Hepatitis B, Hepatitis C Endocrine Patient has history of Type II Diabetes Denies history of Type I Diabetes Genitourinary Denies history of End Stage Renal Disease Immunological Denies history of Lupus Erythematosus, Raynaudoos, Scleroderma Integumentary (Skin) Denies history of History of Burn Musculoskeletal Patient has history of Osteoarthritis Denies history of Gout, Rheumatoid Arthritis, Osteomyelitis Neurologic Patient has history of Neuropathy Denies history of Dementia, Quadriplegia, Paraplegia, Seizure Disorder Oncologic Denies history of Received Chemotherapy, Received Radiation Psychiatric Denies history of Anorexia/bulimia, Confinement Anxiety Medical A Surgical History Notes nd Cardiovascular Hyperlipidemia Gastrointestinal GERD, Diverticulitis Genitourinary Overactive Bladder Musculoskeletal Fibromyalgia, toe amputations x 3 Psychiatric Anxiety Objective Constitutional respirations regular, non-labored and within target range for patient.. Vitals Time Taken: 1:50 PM, Height: 63 in, Weight: 279 lbs, BMI: 49.4, Temperature: 98.6 F, Pulse: 71 bpm, Respiratory Rate: 17 breaths/min, Blood Pressure: 133/81 mmHg. Psychiatric pleasant and cooperative. General Notes: Right lower extremity: Upper leg has an open wound with granulation tissue present, Tissue appears healthy. Left lower extremity:  Lateral side has a large open wound with nonviable tissue and granulation tissue present. Some thickened yellow drainage when palpating the most proximal and distal aspects. Integumentary (Hair, Skin) Wound #6 status is Open. Original cause of wound was Gradually Appeared. The date acquired was: 11/06/2020. The wound has been in treatment 10 weeks. The wound is located on the Right Upper Leg. The wound measures 2cm length x 2.4cm width x 1cm depth; 3.77cm^2 area and 3.77cm^3 volume. There is Fat Layer (Subcutaneous Tissue) exposed. There is no tunneling or undermining noted. There is a medium amount of serosanguineous drainage noted. The wound margin is distinct with the outline attached to the wound base. There is large (67-100%) red, pink granulation within the wound bed. There is no necrotic tissue within the wound bed. Wound #8 status is Open. Original cause of wound was  Gradually Appeared. The date acquired was: 01/06/2021. The wound has been in treatment 10 weeks. The wound is located on the Left,Lateral Upper Leg. The wound measures 12.2cm length x 7.2cm width x 5.3cm depth; 68.989cm^2 area and 365.644cm^3 volume. There is muscle and Fat Layer (Subcutaneous Tissue) exposed. There is tunneling at 7:00 with a maximum distance of 4cm. There is additional tunneling at 10:00 with a maximum distance of 6cm, at 11:00 with a maximum distance of 5cm, and at 6:00 with a maximum distance of 1cm. There is a large amount of purulent drainage noted. Foul odor after cleansing was noted. The wound margin is distinct with the outline attached to the wound base. There is medium (34- 66%) red granulation within the wound bed. There is a medium (34-66%) amount of necrotic tissue within the wound bed including Adherent Slough. Assessment Active Problems ICD-10 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ity Non-pressure chronic ulcer of other part of right lower leg with  unspecified severity Type 2 diabetes mellitus without complications Acquired absence of left leg below knee Patient's right upper extremity wound is stable. I recommended continuing with silver alginate. She can add antifungal cream to the surrounding area due to its location within the skin fold. The left lateral leg wound appears healing. There was some thickened yellow drainage when palpating the most distal and proximal end. There is some tunneling throughout the wound and I would like to obtain an ultrasound to assure there is no Areas that are loculated. Overall no obvious signs of infection. Plan Follow-up Appointments: Return Appointment in 2 weeks. - Dr. Heber [place] ***60 minutes extra time*** Bathing/ Shower/ Hygiene: Other Bathing/Shower/Hygiene Orders/Instructions: - You may sponge bathe Negative Presssure Wound Therapy: Wound #8 Left,Lateral Upper Leg: Wound Vac to wound continuously at 120m/hg pressure - change three times a week. Black Foam Off-Loading: Turn and reposition every 2 hours ordered were: Ultrasound of Left Upper Leg - Chronic Upper Leg Wound, rule out abscess WOUND #6: - Upper Leg Wound Laterality: Right Cleanser: Wound Cleanser (Generic) 1 x Per Day/15 Days Discharge Instructions: Cleanse the wound with wound cleanser prior to applying a clean dressing using gauze sponges, not tissue or cotton balls. Peri-Wound Care: Ketoconazole Cream 2% 1 x Per Day/15 Days Discharge Instructions: In office only*** patient to apply over the counter antifungal cream.***Apply Ketoconazole as directed Prim Dressing: KerraCel Ag Gelling Fiber Dressing, 4x5 in (silver alginate) (Generic) 1 x Per Day/15 Days ary Discharge Instructions: Apply silver alginate to wound bed as instructed Secondary Dressing: Woven Gauze Sponge, Non-Sterile 4x4 in (Generic) 1 x Per Day/15 Days Discharge Instructions: Apply over primary dressing as directed. Secondary Dressing: ABD Pad, 5x9 (Generic) 1 x  Per Day/15 Days Discharge Instructions: Apply over primary dressing as directed. Secured With: 37M Medipore H Soft Cloth Surgical T 4 x 2 (in/yd) (Generic) 1 x Per Day/15 Days ape Discharge Instructions: Secure dressing with tape as directed. WOUND #8: - Upper Leg Wound Laterality: Left, Lateral Cleanser: Anasept Antimicrobial Skin and Wound Cleanser, 8 (oz) 3 x Per Week/15 Days Discharge Instructions: Cleanse the wound with Anasept cleanser prior to applying a clean dressing using gauze sponges, not tissue or cotton balls. 1. Continue wound VAC to the left lateral leg 2. Ultrasound of the left lateral leg 3. Continue silver alginate and antifungal cream to the right upper leg wound 4. Follow-up in 2 weeks Electronic Signature(s) Signed: 07/09/2021 2:43:56 PM By: HKalman ShanDO Entered By: HKalman Shanon 07/09/2021  14:43:22 -------------------------------------------------------------------------------- HxROS Details Patient Name: Date of Service: Yesenia Payne, [name] 07/09/2021 1:30 PM Medical Record Number: [id number] Patient Account Number: [id number] Date of Birth/Sex: Treating RN: Mar 06, 1951 (70 y.o. [name], [name] Primary Care Provider: [name] Other Clinician: Referring Provider: Treating Provider/Extender: Alysia Penna in Treatment: 10 Information Obtained From Patient Eyes Medical History: Positive for: Cataracts - lens implant done; Glaucoma Negative for: Optic Neuritis Ear/Nose/Mouth/Throat Medical History: Positive for: Chronic sinus problems/congestion - seasonal Negative for: Middle ear problems Hematologic/Lymphatic Medical History: Negative for: Anemia; Hemophilia; Human Immunodeficiency Virus; Lymphedema; Sickle Cell Disease Respiratory Medical History: Positive for: Asthma Negative for: Aspiration; Chronic Obstructive Pulmonary Disease (COPD); Pneumothorax; Sleep Apnea; Tuberculosis Cardiovascular Medical  History: Positive for: Hypertension - not on meds presently Negative for: Angina; Arrhythmia; Congestive Heart Failure; Coronary Artery Disease; Deep Vein Thrombosis; Hypotension; Myocardial Infarction; Peripheral Arterial Disease; Peripheral Venous Disease; Phlebitis; Vasculitis Past Medical History Notes: Hyperlipidemia Gastrointestinal Medical History: Negative for: Cirrhosis ; Colitis; Crohns; Hepatitis A; Hepatitis B; Hepatitis C Past Medical History Notes: GERD, Diverticulitis Endocrine Medical History: Positive for: Type II Diabetes Negative for: Type I Diabetes Time with diabetes: 12 years Treated with: Insulin Blood sugar tested every day: Yes Tested : bid Blood sugar testing results: Breakfast: 140 max; Bedtime: 220's max Genitourinary Medical History: Negative for: End Stage Renal Disease Past Medical History Notes: Overactive Bladder Immunological Medical History: Negative for: Lupus Erythematosus; Raynauds; Scleroderma Integumentary (Skin) Medical History: Negative for: History of Burn Musculoskeletal Medical History: Positive for: Osteoarthritis Negative for: Gout; Rheumatoid Arthritis; Osteomyelitis Past Medical History Notes: Fibromyalgia, toe amputations x 3 Neurologic Medical History: Positive for: Neuropathy Negative for: Dementia; Quadriplegia; Paraplegia; Seizure Disorder Oncologic Medical History: Negative for: Received Chemotherapy; Received Radiation Psychiatric Medical History: Negative for: Anorexia/bulimia; Confinement Anxiety Past Medical History Notes: Anxiety HBO Extended History Items Ear/Nose/Mouth/Throat: Eyes: Eyes: Chronic sinus Cataracts Glaucoma problems/congestion Immunizations Pneumococcal Vaccine: Received Pneumococcal Vaccination: Yes Received Pneumococcal Vaccination On or After 60th Birthday: No Immunization Notes: up to date on pneumonia and tetanus shots Implantable Devices None Family and Social  History Cancer: Yes - Child; Diabetes: Yes - Father; Heart Disease: Yes - Mother,Father; Hereditary Spherocytosis: No; Hypertension: Yes - Mother,Father; Kidney Disease: No; Lung Disease: No; Seizures: No; Stroke: No; Thyroid Problems: No; Tuberculosis: No; Never smoker; Marital Status - Married; Alcohol Use: Never; Drug Use: No History; Caffeine Use: Daily - coffee , sodas; Financial Concerns: No; Food, Clothing or Shelter Needs: No; Support System Lacking: No; Transportation Concerns: No Electronic Signature(s) Signed: 07/09/2021 2:43:56 PM By: Kalman Shan DO Signed: 07/09/2021 4:23:50 PM By: Rhae Hammock RN Entered By: Kalman Shan on 07/09/2021 14:38:26 -------------------------------------------------------------------------------- SuperBill Details Patient Name: Date of Service: Yesenia Yesenia San, [name] 07/09/2021 Medical Record Number: [id number] Patient Account Number: [id number] Date of Birth/Sex: Treating RN: November 07, 1951 (70 y.o. 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 Facility Procedures CPT4 Code: 41962229 Description: 99213 - WOUND CARE VISIT-LEV 3 EST PT Modifier: Quantity: 1 Physician Procedures : CPT4 Code Description Modifier [id number] 99213 - WC PHYS LEVEL 3 - EST PT ICD-10 Diagnosis Description L97.829 Non-pressure chronic ulcer  of other part of left lower leg with unspecified severity L97.819 Non-pressure chronic ulcer of other part of right  lower leg with unspecified severity E11.9 Type 2  diabetes mellitus without complications Z79.150 Acquired absence of left leg below knee Quantity: 1 Electronic Signature(s) Signed: 07/09/2021 2:43:56 PM By: Kalman Shan DO Entered By: Kalman Shan on 07/09/2021 14:43:38

## 2021-07-12 DIAGNOSIS — L02612 Cutaneous abscess of left foot: Secondary | ICD-10-CM | POA: Diagnosis not present

## 2021-07-12 DIAGNOSIS — L8990 Pressure ulcer of unspecified site, unspecified stage: Secondary | ICD-10-CM | POA: Diagnosis not present

## 2021-07-14 NOTE — Progress Notes (Signed)
Yesenia Payne, Yesenia Payne ([id number]) Visit Report for 07/09/2021 Arrival Information Details Patient Name: Date of Service: [place] Yesenia Payne, [place] Payne D. 07/09/2021 1:30 PM Medical Record Number: [id number] Patient Account Number: [id number] Date of Birth/Sex: Treating RN: 12-25-1950 (70 y.o. Yesenia Payne, Yesenia Payne Primary Care Provider: Clovia Payne Other Clinician: Referring Provider: Treating Provider/Extender: Yesenia Payne in Treatment: 10 Visit Information History Since Last Visit Added or deleted any medications: No Patient Arrived: Stretcher Any new allergies or adverse reactions: No Arrival Time: 13:50 Had a fall or experienced change in No Accompanied By: daughter activities of daily living that may affect Transfer Assistance: Manual risk of falls: Patient Identification Verified: Yes Signs or symptoms of abuse/neglect since last visito No Secondary Verification Process Completed: Yes Hospitalized since last visit: No Patient Requires Transmission-Based Precautions: No Implantable device outside of the clinic excluding No Patient Has Alerts: No cellular tissue based products placed in the center since last visit: Has Dressing in Place as Prescribed: Yes Pain Present Now: Yes Electronic Signature(s) Signed: 07/14/2021 7:50:07 AM By: Yesenia Payne Entered By: Yesenia Payne on 07/09/2021 13:50:41 -------------------------------------------------------------------------------- Clinic Level of Care Assessment Details Patient Name: Date of Service: [place] Yesenia Payne, [place] RGA Payne D. 07/09/2021 1:30 PM Medical Record Number: [id number] Patient Account Number: [id number] Date of Birth/Sex: Treating RN: 1951-07-20 (71 y.o. Yesenia Payne Primary Care Provider: Clovia Payne Other Clinician: Referring Provider: Treating Provider/Extender: Yesenia Payne in Treatment: 10 Clinic Level of Care Assessment Items TOOL 4 Quantity Score X- 1 0 Use  when only an EandM is performed on FOLLOW-UP visit ASSESSMENTS - Nursing Assessment / Reassessment X- 1 10 Reassessment of Co-morbidities (includes updates in patient status) X- 1 5 Reassessment of Adherence to Treatment Plan ASSESSMENTS - Wound and Skin A ssessment / Reassessment [] - 0 Simple Wound Assessment / Reassessment - one wound X- 2 5 Complex Wound Assessment / Reassessment - multiple wounds [] - 0 Dermatologic / Skin Assessment (not related to wound area) ASSESSMENTS - Focused Assessment [] - 0 Circumferential Edema Measurements - multi extremities [] - 0 Nutritional Assessment / Counseling / Intervention [] - 0 Lower Extremity Assessment (monofilament, tuning fork, pulses) [] - 0 Peripheral Arterial Disease Assessment (using hand held doppler) ASSESSMENTS - Ostomy and/or Continence Assessment and Care [] - 0 Incontinence Assessment and Management [] - 0 Ostomy Care Assessment and Management (repouching, etc.) PROCESS - Coordination of Care [] - 0 Simple Patient / Family Education for ongoing care X- 1 20 Complex (extensive) Patient / Family Education for ongoing care X- 1 10 Staff obtains Programmer, systems, Records, T Results / Process Orders est [] - 0 Staff telephones HHA, Nursing Homes / Clarify orders / etc [] - 0 Routine Transfer to another Facility (non-emergent condition) [] - 0 Routine Hospital Admission (non-emergent condition) [] - 0 New Admissions / Biomedical engineer / Ordering NPWT Apligraf, etc. , [] - 0 Emergency Hospital Admission (emergent condition) [] - 0 Simple Discharge Coordination [] - 0 Complex (extensive) Discharge Coordination PROCESS - Special Needs [] - 0 Pediatric / Minor Patient Management [] - 0 Isolation Patient Management [] - 0 Hearing / Language / Visual special needs [] - 0 Assessment of Community assistance (transportation, D/C planning, etc.) [] - 0 Additional assistance / Altered mentation [] - 0 Support  Surface(s) Assessment (bed, cushion, seat, etc.) INTERVENTIONS - Wound Cleansing / Measurement [] - 0 Simple Wound Cleansing - one wound X- 1 5 Complex Wound Cleansing - multiple  wounds X- 1 5 Wound Imaging (photographs - any number of wounds) [] - 0 Wound Tracing (instead of photographs) X- 1 5 Simple Wound Measurement - one wound [] - 0 Complex Wound Measurement - multiple wounds INTERVENTIONS - Wound Dressings [] - 0 Small Wound Dressing one or multiple wounds X- 2 15 Medium Wound Dressing one or multiple wounds [] - 0 Large Wound Dressing one or multiple wounds [] - 0 Application of Medications - topical [] - 0 Application of Medications - injection INTERVENTIONS - Miscellaneous [] - 0 External ear exam [] - 0 Specimen Collection (cultures, biopsies, blood, body fluids, etc.) [] - 0 Specimen(s) / Culture(s) sent or taken to Lab for analysis [] - 0 Patient Transfer (multiple staff / Civil Service fast streamer / Similar devices) [] - 0 Simple Staple / Suture removal (25 or less) [] - 0 Complex Staple / Suture removal (26 or more) [] - 0 Hypo / Hyperglycemic Management (close monitor of Blood Glucose) [] - 0 Ankle / Brachial Index (ABI) - do not check if billed separately X- 1 5 Vital Signs Has the patient been seen at the hospital within the last three years: Yes Total Score: 105 Level Of Care: New/Established - Level 3 Electronic Signature(s) Signed: 07/09/2021 4:37:51 PM By: Yesenia Payne Entered By: Yesenia Payne on 07/09/2021 14:32:15 -------------------------------------------------------------------------------- Encounter Discharge Information Details Patient Name: Date of Service: [place] Yesenia San, [place] RGA Payne D. 07/09/2021 1:30 PM Medical Record Number: [id number] Patient Account Number: [id number] Date of Birth/Sex: Treating RN: 08-15-1951 (70 y.o. Yesenia Payne Primary Care Yesenia Payne: Yesenia Payne Other Clinician: Referring Yesenia Payne: Treating Yesenia Payne: Yesenia Payne in Treatment: 10 Encounter Discharge Information Items Discharge Condition: Stable Ambulatory Status: Stretcher Discharge Destination: Home Transportation: Ambulance Accompanied By: Daughter Schedule Follow-up Appointment: Yes Clinical Summary of Care: Provided on 07/09/2021 Form Type Recipient Paper Patient Patient Electronic Signature(s) Signed: 07/09/2021 4:37:51 PM By: Yesenia Payne Entered By: Yesenia Payne on 07/09/2021 15:06:52 -------------------------------------------------------------------------------- Lower Extremity Assessment Details Patient Name: Date of Service: [place] Yesenia San, [place] RGA Payne D. 07/09/2021 1:30 PM Medical Record Number: [id number] Patient Account Number: [id number] Date of Birth/Sex: Treating RN: 10-20-1951 (69 y.o. Yesenia Payne Primary Care Kerrie Latour: Yesenia Payne Other Clinician: Referring Rockland Kotarski: Treating Linnie Mcglocklin/Extender: Yesenia Payne in Treatment: 10 Electronic Signature(s) Signed: 07/09/2021 4:37:51 PM By: Yesenia Payne Entered By: Yesenia Payne on 07/09/2021 14:22:24 -------------------------------------------------------------------------------- Multi Wound Chart Details Patient Name: Date of Service: [place] Yesenia San, [place] RGA Payne D. 07/09/2021 1:30 PM Medical Record Number: [id number] Patient Account Number: [id number] Date of Birth/Sex: Treating RN: [birth year] (70 y.o. Yesenia Payne, Yesenia Payne Primary Care Tamitha Norell: Yesenia Payne Other Clinician: Referring Doaa Kendzierski: Treating Zylon Creamer/Extender: Yesenia Payne in Treatment: 10 Vital Signs Height(in): 74 Pulse(bpm): 44 Weight(lbs): 6 Blood Pressure(mmHg): 133/81 Body Mass Index(BMI): 49 Temperature(F): 98.6 Respiratory Rate(breaths/min): 17 Photos: [N/A:N/A] Right Upper Leg Left, Lateral Upper Leg N/A Wound Location: Gradually Appeared Gradually Appeared N/A Wounding Event: Diabetic Wound/Ulcer of the Lower  Diabetic Wound/Ulcer of the Lower N/A Primary Etiology: Extremity Extremity Cataracts, Glaucoma, Chronic sinus Cataracts, Glaucoma, Chronic sinus N/A Comorbid History: problems/congestion, Asthma, problems/congestion, Asthma, Hypertension, Type II Diabetes, Hypertension, Type II Diabetes, Osteoarthritis, Neuropathy Osteoarthritis, Neuropathy 11/06/2020 01/06/2021 N/A Date Acquired: 10 10 N/A Weeks of Treatment: Open Open N/A Wound Status: 2x2.4x1 12.2x7.2x5.3 N/A Measurements L x W x D (cm) 3.77 68.989 N/A A (cm) : rea 3.77 365.644 N/A Volume (cm) : 86.70% 39.00% N/A % Reduction in A rea: 86.70% -  29.30% N/A % Reduction in Volume: 7 Position 1 (o'clock): 4 Maximum Distance 1 (cm): 10 Position 2 (o'clock): 6 Maximum Distance 2 (cm): 11 Position 3 (o'clock): 5 Maximum Distance 3 (cm): 6 Position 4 (o'clock): 1 Maximum Distance 4 (cm): No Yes N/A Tunneling: Grade 2 Grade 2 N/A Classification: Medium Large N/A Exudate A mount: Serosanguineous Purulent N/A Exudate Type: red, brown yellow, brown, green N/A Exudate Color: No Yes N/A Foul Odor A Cleansing: fter N/A No N/A Odor A nticipated Due to Product Use: Distinct, outline attached Distinct, outline attached N/A Wound Margin: Large (67-100%) Medium (34-66%) N/A Granulation A mount: Red, Pink Red N/A Granulation Quality: None Present (0%) Medium (34-66%) N/A Necrotic A mount: Fat Layer (Subcutaneous Tissue): Yes Fat Layer (Subcutaneous Tissue): Yes N/A Exposed Structures: Fascia: No Muscle: Yes Tendon: No Fascia: No Muscle: No Tendon: No Joint: No Joint: No Bone: No Bone: No Medium (34-66%) None N/A Epithelialization: Treatment Notes Electronic Signature(s) Signed: 07/09/2021 2:43:56 PM By: Kalman Shan DO Signed: 07/09/2021 4:23:50 PM By: Rhae Hammock RN Entered By: Kalman Shan on 07/09/2021  14:37:32 -------------------------------------------------------------------------------- Multi-Disciplinary Care Plan Details Patient Name: Date of Service: [place] Yesenia San, [place] R[place] Payne D. 07/09/2021 1:30 PM Medical Record Number: [id number] Patient Account Number: [id number] Date of Birth/Sex: Treating RN: July 10, 1951 (70 y.o. Yesenia Payne Primary Care Provider: Clovia Payne Other Clinician: Referring Provider: Treating Provider/Extender: Yesenia Payne in Treatment: 10 Active Inactive Wound/Skin Impairment Nursing Diagnoses: Impaired tissue integrity Knowledge deficit related to ulceration/compromised skin integrity Goals: Patient will demonstrate a reduced rate of smoking or cessation of smoking Date Initiated: 04/30/2021 Target Resolution Date: 08/06/2021 Goal Status: Active Patient/caregiver will verbalize understanding of skin care regimen Date Initiated: 04/30/2021 Date Inactivated: 07/09/2021 Target Resolution Date: 07/10/2021 Goal Status: Met Ulcer/skin breakdown will have a volume reduction of 50% by week 8 Date Initiated: 07/09/2021 Target Resolution Date: 08/06/2021 Goal Status: Active Interventions: Assess patient/caregiver ability to obtain necessary supplies Assess patient/caregiver ability to perform ulcer/skin care regimen upon admission and as needed Assess ulceration(s) every visit Notes: Electronic Signature(s) Signed: 07/09/2021 4:37:51 PM By: Yesenia Payne Entered By: Yesenia Payne on 07/09/2021 14:24:17 -------------------------------------------------------------------------------- Pain Assessment Details Patient Name: Date of Service: [place] Yesenia San, [place] R[place] Payne D. 07/09/2021 1:30 PM Medical Record Number: [id number] Patient Account Number: [id number] Date of Birth/Sex: Treating RN: 19-May-1951 (70 y.o. Yesenia Payne, Yesenia Payne Primary Care Provider: Clovia Payne Other Clinician: Referring Provider: Treating Provider/Extender: Yesenia Payne in Treatment: 10 Active Problems Location of Pain Severity and Description of Pain Patient Has Paino Yes Site Locations Rate the pain. Rate the pain. Current Pain Level: 4 Pain Management and Medication Current Pain Management: Electronic Signature(s) Signed: 07/09/2021 4:23:50 PM By: Rhae Hammock RN Signed: 07/14/2021 7:50:07 AM By: Yesenia Payne Entered By: Yesenia Payne on 07/09/2021 13:51:15 -------------------------------------------------------------------------------- Patient/Caregiver Education Details Patient Name: Date of Service: [place] Yesenia San, [place] R[place] Payne D. 9/1/2022andnbsp1:30 PM Medical Record Number: [id number] Patient Account Number: [id number] Date of Birth/Gender: Treating RN: 1951/02/21 (70 y.o. Yesenia Payne Primary Care Physician: Yesenia Payne Other Clinician: Referring Physician: Treating Physician/Extender: Yesenia Payne in Treatment: 10 Education Assessment Education Provided To: Patient and Caregiver Education Topics Provided Wound/Skin Impairment: Methods: Explain/Verbal, Printed Responses: State content correctly [employer]) Signed: 07/09/2021 4:37:51 PM By: Yesenia Payne Entered By: Yesenia Payne on 07/09/2021 14:24:41 -------------------------------------------------------------------------------- Wound Assessment Details Patient Name: Date of Service: [place] Yesenia San, [place] RGA Payne D. 07/09/2021 1:30 PM Medical Record Number: [id number] Patient  Account Number: [id number] Date of Birth/Sex: Treating RN: 1951-09-19 (70 y.o. Yesenia Payne Primary Care Tedford Berg: Yesenia Payne Other Clinician: Referring Adetokunbo Mccadden: Treating Yui Mulvaney/Extender: Yesenia Payne in Treatment: 10 Wound Status Wound Number: 6 Primary Diabetic Wound/Ulcer of the Lower Extremity Etiology: Wound Location: Right Upper Leg Wound Open Wounding Event: Gradually  Appeared Status: Date Acquired: 11/06/2020 Comorbid Cataracts, Glaucoma, Chronic sinus problems/congestion, Asthma, Weeks Of Treatment: 10 History: Hypertension, Type II Diabetes, Osteoarthritis, Neuropathy Clustered Wound: No Photos Wound Measurements Length: (cm) 2 Width: (cm) 2.4 Depth: (cm) 1 Area: (cm) 3.77 Volume: (cm) 3.77 % Reduction in Area: 86.7% % Reduction in Volume: 86.7% Epithelialization: Medium (34-66%) Tunneling: No Undermining: No Wound Description Classification: Grade 2 Wound Margin: Distinct, outline attached Exudate Amount: Medium Exudate Type: Serosanguineous Exudate Color: red, brown Foul Odor After Cleansing: No Slough/Fibrino No Wound Bed Granulation Amount: Large (67-100%) Exposed Structure Granulation Quality: Red, Pink Fascia Exposed: No Necrotic Amount: None Present (0%) Fat Layer (Subcutaneous Tissue) Exposed: Yes Tendon Exposed: No Muscle Exposed: No Joint Exposed: No Bone Exposed: No Treatment Notes Wound #6 (Upper Leg) Wound Laterality: Right Cleanser Wound Cleanser Discharge Instruction: Cleanse the wound with wound cleanser prior to applying a clean dressing using gauze sponges, not tissue or cotton balls. Peri-Wound Care Ketoconazole Cream 2% Discharge Instruction: In office only*** patient to apply over the counter antifungal cream.***Apply Ketoconazole as directed Topical Primary Dressing KerraCel Ag Gelling Fiber Dressing, 4x5 in (silver alginate) Discharge Instruction: Apply silver alginate to wound bed as instructed Secondary Dressing Woven Gauze Sponge, Non-Sterile 4x4 in Discharge Instruction: Apply over primary dressing as directed. ABD Pad, 5x9 Discharge Instruction: Apply over primary dressing as directed. Secured With 26M Medipore H Soft Cloth Surgical T 4 x 2 (in/yd) ape Discharge Instruction: Secure dressing with tape as directed. Compression Wrap Compression Stockings Add-Ons Electronic  Signature(s) Signed: 07/09/2021 4:37:51 PM By: Yesenia Payne Entered By: Yesenia Payne on 07/09/2021 14:22:04 -------------------------------------------------------------------------------- Wound Assessment Details Patient Name: Date of Service: [place] Yesenia San, [place] RGA Payne D. 07/09/2021 1:30 PM Medical Record Number: [id number] Patient Account Number: [id number] Date of Birth/Sex: Treating RN: 27-May-1951 (70 y.o. Yesenia Payne Primary Care Ramla Hase: Yesenia Payne Other Clinician: Referring Matas Burrows: Treating Lilinoe Acklin/Extender: Yesenia Payne in Treatment: 10 Wound Status Wound Number: 8 Primary Diabetic Wound/Ulcer of the Lower Extremity Etiology: Wound Location: Left, Lateral Upper Leg Wound Open Wounding Event: Gradually Appeared Status: Date Acquired: 01/06/2021 Comorbid Cataracts, Glaucoma, Chronic sinus problems/congestion, Asthma, Weeks Of Treatment: 10 History: Hypertension, Type II Diabetes, Osteoarthritis, Neuropathy Clustered Wound: No Photos Wound Measurements Length: (cm) 12.2 Width: (cm) 7.2 Depth: (cm) 5.3 Area: (cm) 68.989 Volume: (cm) 365.644 % Reduction in Area: 39% % Reduction in Volume: -29.3% Epithelialization: None Tunneling: Yes Location 1 Position (o'clock): 7 Maximum Distance: (cm) 4 Location 2 Position (o'clock): 10 Maximum Distance: (cm) 6 Location 3 Position (o'clock): 11 Maximum Distance: (cm) 5 Location 4 Position (o'clock): 6 Maximum Distance: (cm) 1 Wound Description Classification: Grade 2 Wound Margin: Distinct, outline attached Exudate Amount: Large Exudate Type: Purulent Exudate Color: yellow, brown, green Foul Odor After Cleansing: Yes Due to Product Use: No Slough/Fibrino Yes Wound Bed Granulation Amount: Medium (34-66%) Exposed Structure Granulation Quality: Red Fascia Exposed: No Necrotic Amount: Medium (34-66%) Fat Layer (Subcutaneous Tissue) Exposed: Yes Necrotic Quality: Adherent  Slough Tendon Exposed: No Muscle Exposed: Yes Necrosis of Muscle: No Joint Exposed: No Bone Exposed: No Treatment Notes Wound #8 (Upper Leg) Wound Laterality: Left, Lateral Cleanser Anasept Antimicrobial Skin and Wound  Cleanser, 8 (oz) Discharge Instruction: Cleanse the wound with Anasept cleanser prior to applying a clean dressing using gauze sponges, not tissue or cotton balls. Peri-Wound Care Topical Primary Dressing Secondary Dressing Secured With Compression Wrap Compression Stockings Add-Ons Electronic Signature(s) Signed: 07/09/2021 4:37:51 PM By: Yesenia Payne Entered By: Yesenia Payne on 07/09/2021 14:21:35 -------------------------------------------------------------------------------- Vitals Details Patient Name: Date of Service: [place] Yesenia San, [place] RGA Payne D. 07/09/2021 1:30 PM Medical Record Number: [id number] Patient Account Number: [id number] Date of Birth/Sex: Treating RN: [birth year] (70 y.o. Yesenia Payne, Yesenia Payne Primary Care Provider: Clovia Payne Other Clinician: Referring Provider: Treating Provider/Extender: Yesenia Payne in Treatment: 10 Vital Signs Time Taken: 13:50 Temperature (F): 98.6 Height (in): 63 Pulse (bpm): 71 Weight (lbs): 279 Respiratory Rate (breaths/min): 17 Body Mass Index (BMI): 49.4 Blood Pressure (mmHg): 133/81 Reference Range: 80 - 120 mg / dl Electronic Signature(s) Signed: 07/14/2021 7:50:07 AM By: Yesenia Payne Entered By: Yesenia Payne on 07/09/2021 13:50:59

## 2021-07-15 DIAGNOSIS — L8990 Pressure ulcer of unspecified site, unspecified stage: Secondary | ICD-10-CM | POA: Diagnosis not present

## 2021-07-15 DIAGNOSIS — K219 Gastro-esophageal reflux disease without esophagitis: Secondary | ICD-10-CM | POA: Diagnosis not present

## 2021-07-21 ENCOUNTER — Ambulatory Visit (HOSPITAL_COMMUNITY)
Admission: RE | Admit: 2021-07-21 | Discharge: 2021-07-21 | Disposition: A | Discharge status: Home / Self Care | Payer: Medicare Other | Source: Ambulatory Visit | Attending: Internal Medicine | Admitting: Internal Medicine

## 2021-07-21 DIAGNOSIS — R279 Unspecified lack of coordination: Secondary | ICD-10-CM | POA: Diagnosis not present

## 2021-07-21 DIAGNOSIS — S81802A Unspecified open wound, left lower leg, initial encounter: Secondary | ICD-10-CM | POA: Diagnosis not present

## 2021-07-21 DIAGNOSIS — Z7401 Bed confinement status: Secondary | ICD-10-CM | POA: Diagnosis not present

## 2021-07-21 DIAGNOSIS — R5381 Other malaise: Secondary | ICD-10-CM | POA: Diagnosis not present

## 2021-07-21 DIAGNOSIS — Z743 Need for continuous supervision: Secondary | ICD-10-CM | POA: Diagnosis not present

## 2021-07-21 DIAGNOSIS — R531 Weakness: Secondary | ICD-10-CM | POA: Diagnosis not present

## 2021-07-23 ENCOUNTER — Encounter (HOSPITAL_BASED_OUTPATIENT_CLINIC_OR_DEPARTMENT_OTHER): Payer: Medicare Other | Admitting: Internal Medicine

## 2021-07-27 ENCOUNTER — Encounter (HOSPITAL_BASED_OUTPATIENT_CLINIC_OR_DEPARTMENT_OTHER): Payer: Medicare Other | Admitting: Internal Medicine

## 2021-07-28 DIAGNOSIS — L97829 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ity: Secondary | ICD-10-CM | POA: Diagnosis not present

## 2021-07-28 DIAGNOSIS — E1169 Type 2 diabetes mellitus with other specified complication: Secondary | ICD-10-CM | POA: Diagnosis not present

## 2021-08-07 DIAGNOSIS — L02612 Cutaneous abscess of left foot: Secondary | ICD-10-CM | POA: Diagnosis not present

## 2021-08-07 DIAGNOSIS — L8990 Pressure ulcer of unspecified site, unspecified stage: Secondary | ICD-10-CM | POA: Diagnosis not present

## 2021-08-08 DIAGNOSIS — E1169 Type 2 diabetes mellitus with other specified complication: Secondary | ICD-10-CM | POA: Diagnosis not present

## 2021-08-08 DIAGNOSIS — L97829 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ity: Secondary | ICD-10-CM | POA: Diagnosis not present

## 2021-08-10 ENCOUNTER — Encounter (HOSPITAL_BASED_OUTPATIENT_CLINIC_OR_DEPARTMENT_OTHER): Payer: Medicare Other | Admitting: Internal Medicine

## 2021-08-11 DIAGNOSIS — L8990 Pressure ulcer of unspecified site, unspecified stage: Secondary | ICD-10-CM | POA: Diagnosis not present

## 2021-08-11 DIAGNOSIS — L02612 Cutaneous abscess of left foot: Secondary | ICD-10-CM | POA: Diagnosis not present

## 2021-08-15 DIAGNOSIS — Z89519 Acquired absence of unspecified leg below knee: Secondary | ICD-10-CM | POA: Diagnosis not present

## 2021-08-15 DIAGNOSIS — G894 Chronic pain syndrome: Secondary | ICD-10-CM | POA: Diagnosis not present

## 2021-08-15 DIAGNOSIS — I10 Essential (primary) hypertension: Secondary | ICD-10-CM | POA: Diagnosis not present

## 2021-08-15 DIAGNOSIS — E1165 Type 2 diabetes mellitus with hyperglycemia: Secondary | ICD-10-CM | POA: Diagnosis not present

## 2021-08-17 ENCOUNTER — Encounter (HOSPITAL_BASED_OUTPATIENT_CLINIC_OR_DEPARTMENT_OTHER): Payer: Medicare Other | Attending: Internal Medicine | Admitting: Internal Medicine

## 2021-08-17 ENCOUNTER — Other Ambulatory Visit: Payer: Self-pay

## 2021-08-17 DIAGNOSIS — N3281 Overactive bladder: Secondary | ICD-10-CM | POA: Insufficient documentation

## 2021-08-17 DIAGNOSIS — Z833 Family history of diabetes mellitus: Secondary | ICD-10-CM | POA: Diagnosis not present

## 2021-08-17 DIAGNOSIS — R279 Unspecified lack of coordination: Secondary | ICD-10-CM | POA: Diagnosis not present

## 2021-08-17 DIAGNOSIS — R531 Weakness: Secondary | ICD-10-CM | POA: Diagnosis not present

## 2021-08-17 DIAGNOSIS — E1142 Type 2 diabetes mellitus with diabetic polyneuropathy: Secondary | ICD-10-CM | POA: Diagnosis not present

## 2021-08-17 DIAGNOSIS — E1151 Type 2 diabetes mellitus with diabetic peripheral angiopathy without gangrene: Secondary | ICD-10-CM | POA: Insufficient documentation

## 2021-08-17 DIAGNOSIS — L97819 Non-pressure chronic ulcer of other part of right lower leg with unspecified severity: Secondary | ICD-10-CM | POA: Insufficient documentation

## 2021-08-17 DIAGNOSIS — Z8249 Family history of ischemic heart disease and other diseases of the circulatory system: Secondary | ICD-10-CM | POA: Insufficient documentation

## 2021-08-17 DIAGNOSIS — Z743 Need for continuous supervision: Secondary | ICD-10-CM | POA: Diagnosis not present

## 2021-08-17 DIAGNOSIS — L97829 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ity: Secondary | ICD-10-CM | POA: Diagnosis not present

## 2021-08-17 DIAGNOSIS — M797 Fibromyalgia: Secondary | ICD-10-CM | POA: Insufficient documentation

## 2021-08-17 DIAGNOSIS — Z794 Long term (current) use of insulin: Secondary | ICD-10-CM | POA: Insufficient documentation

## 2021-08-17 DIAGNOSIS — E119 Type 2 diabetes mellitus without complications: Secondary | ICD-10-CM

## 2021-08-17 DIAGNOSIS — Z89512 Acquired absence of left leg below knee: Secondary | ICD-10-CM | POA: Diagnosis not present

## 2021-08-17 NOTE — Progress Notes (Signed)
[name], [name] ([id number]) Visit Report for 08/17/2021 Chief Complaint Document Details Patient Name: Date of Service: [name], [name] 08/17/2021 10:45 A M Medical Record Number: [id number] Patient Account Number: [id number] Date of Birth/Sex: Treating RN: 1951-08-21 (70 y.o. [name] Primary Care Provider: [name] Other Clinician: Referring Provider: Treating Provider/Extender: Alysia Penna in Treatment: 15 Information Obtained from: Patient Chief Complaint Left lower extremity wounds and right upper leg wound Electronic Signature(s) Signed: 08/17/2021 12:06:10 PM By: Kalman Shan DO Entered By: Kalman Shan on 08/17/2021 12:00:28 -------------------------------------------------------------------------------- HPI Details Patient Name: Date of Service: [name], [name] 08/17/2021 10:45 A M Medical Record Number: [id number] Patient Account Number: [id number] Date of Birth/Sex: Treating RN: [birth year] (70 y.o. [name] Primary Care Provider: [name] Other Clinician: Referring Provider: Treating Provider/Extender: Alysia Penna in Treatment: 15 History of Present Illness HPI Description: Admission 04/30/2021 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 She has been seen in our clinic multiple times last seen in 03/2019 for right toe wounds. Today she presents for 3 wounds. 1 is located to the right upper leg and 2 to the left lower extremity. They started at the end of December And not sure how I started. She had not been using any dressings up until 2 weeks ago. She is currently using silver alginate to the right upper leg and Santyl to the wounds on the left leg. She has been on doxycycline and 2 rounds of clindamycin for cellulitis to  these wounds. She finished her last round of antibiotics 3 weeks ago. She reports minimal pain to the wound. She currently denies signs of infection including increased erythema, warmth or purulent drainage. 7/5; patient presents for 1 week follow-up. She has been using Santyl to the left lower extremity wounds and silver alginate to the right upper leg wound. She denies signs of infection. She has no complaints today. 7/19; patient presents for 1 week follow-up. She has been using Santyl to the left lower extremity wounds and silver alginate to the right upper leg wound. She denies signs of infection. She has had to use a lot of Santyl to the lateral wound since the necrotic tissue has been breaking down and the wound is deeper. 8/4; patient presents for 2-week follow-up. She has been using Santyl to the left lower extremity wound and silver alginate to the right upper leg wound. She has been using the wound VAC to the left lateral wound. She has no issues or complaints today. She denies signs of infection. 8/18; patient presents for 2-week follow-up. She has been using Santyl and silver alginate to the right upper wound. She reports that this is deeper today. She has been using the wound VAC to the left lateral wound. She has no issues or complaints today. She denies signs of infection. She reports that the left lower extremity medial wound is healed 9/1; patient presents for 2-week follow-up. She continues to use silver alginate to the right upper wound. She is using the wound VAC to the left lateral wound. She has no issues today. She denies signs of infection. She overall feels well. 10/10; patient presents for follow-up. Patient has not followed up in a month. She states that her husband was recently in the ICU but is now at home. She reports an increase in the  wound size to the right lower extremity. She reports improvement to the left lower extremity wound. She currently denies signs  of infection. She states she overall feels well. Electronic Signature(s) Signed: 08/17/2021 12:06:10 PM By: Kalman Shan DO Entered By: Kalman Shan on 08/17/2021 12:02:01 -------------------------------------------------------------------------------- Physical Exam Details Patient Name: Date of Service: [name], [name] 08/17/2021 10:45 A M Medical Record Number: [id number] Patient Account Number: [id number] Date of Birth/Sex: Treating RN: [birth year] (70 y.o. [name] Primary Care Provider: [name] Other Clinician: Referring Provider: Treating Provider/Extender: Alysia Penna in Treatment: 15 Constitutional respirations regular, non-labored and within target range for patient.Marland Kitchen Psychiatric pleasant and cooperative. Notes Right lower extremity: Upper leg has an open wound with granulation tissue present, Tissue appears healthy. Left lower extremity: Lateral side has a large open wound with nonviable tissue and granulation tissue present. Electronic Signature(s) Signed: 08/17/2021 12:06:10 PM By: Kalman Shan DO Entered By: Kalman Shan on 08/17/2021 12:02:50 -------------------------------------------------------------------------------- Physician Orders Details Patient Name: Date of Service: [name], [name] 08/17/2021 10:45 A M Medical Record Number: [id number] Patient Account Number: [id number] Date of Birth/Sex: Treating RN: [birth year] (70 y.o. [name] Primary Care Provider: [name] Other Clinician: Referring Provider: Treating Provider/Extender: Alysia Penna in Treatment: 15 Verbal / Phone Orders: No Diagnosis Coding ICD-10 Coding Code Description [id number] 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ity L97.819 Non-pressure chronic ulcer of other part of right lower leg with unspecified severity E11.9 Type 2 diabetes  mellitus without complications W40.973 Acquired absence of left leg below knee Follow-up Appointments ppointment in 2 weeks. - Dr. Heber [place] ***60 minutes extra time*** Return A Bathing/ Shower/ Hygiene Other Bathing/Shower/Hygiene Orders/Instructions: - You may sponge bathe Negative Presssure Wound Therapy Wound #8 Left,Lateral Upper Leg Wound Vac to wound continuously at 114m/hg pressure - change three times a week. Send measurements weekly. Black Foam Off-Loading Turn and reposition every 2 hours Wound Treatment Wound #6 - Upper Leg Wound Laterality: Right Cleanser: Wound Cleanser (Generic) 1 x Per Day/15 Days Discharge Instructions: Cleanse the wound with wound cleanser prior to applying a clean dressing using gauze sponges, not tissue or cotton balls. Peri-Wound Care: Ketoconazole Cream 2% 1 x Per Day/15 Days Discharge Instructions: In office only*** patient to apply over the counter antifungal cream.***Apply Ketoconazole as directed Topical: Dakins Solution 1 x Per Day/15 Days Discharge Instructions: Moisten gauze with Dakins and pack into wound Secondary Dressing: Woven Gauze Sponge, Non-Sterile 4x4 in (Generic) 1 x Per Day/15 Days Discharge Instructions: Apply over primary dressing as directed. Secondary Dressing: ABD Pad, 5x9 (Generic) 1 x Per Day/15 Days Discharge Instructions: Apply over primary dressing as directed. Secured With: 61M Medipore H Soft Cloth Surgical T 4 x 2 (in/yd) (Generic) 1 x Per Day/15 Days ape Discharge Instructions: Secure dressing with tape as directed. Wound #8 - Upper Leg Wound Laterality: Left, Lateral Cleanser: Anasept Antimicrobial Skin and Wound Cleanser, 8 (oz) 3 x Per Week/15 Days Discharge Instructions: Cleanse the wound with Anasept cleanser prior to applying a clean dressing using gauze sponges, not tissue or cotton balls. Electronic Signature(s) Signed: 08/17/2021 12:06:10 PM By: HKalman ShanDO Entered By: HKalman Shanon  08/17/2021 12:03:05 -------------------------------------------------------------------------------- Problem List Details Patient Name: Date of Service: [place] RJoya San [name] 08/17/2021 10:45 A M Medical Record Number: [id number] Account Number: [id number] of Birth/Sex: Treating RN: [birth year](70y.o. F) BBaruch GoutyPrimary  Care Provider: [name] Other Clinician: Referring Provider: Treating Provider/Extender: Alysia Penna in Treatment: 15 Active Problems ICD-10 Encounter Code Description Active Date MDM Diagnosis L97.829 Non-pressure chronic ulcer of other part of left lower leg with unspecified 04/30/2021 No Yes severity L97.819 Non-pressure chronic ulcer of other part of right lower leg with unspecified 04/30/2021 No Yes severity E11.9 Type 2 diabetes mellitus without complications 0/27/2536 No Yes Z89.512 Acquired absence of left leg below knee 04/30/2021 No Yes Inactive Problems Resolved Problems Electronic Signature(s) Signed: 08/17/2021 12:06:10 PM By: Kalman Shan DO Entered By: Kalman Shan on 08/17/2021 11:59:55 -------------------------------------------------------------------------------- Progress Note Details Patient Name: Date of Service: [name], [name] 08/17/2021 10:45 A M Medical Record Number: [id number] Patient Account Number: [id number] Date of Birth/Sex: Treating RN: 06-10-1951 (70 y.o. [name] Primary Care Provider: [name] Other Clinician: Referring Provider: Treating Provider/Extender: Alysia Penna in Treatment: 15 Subjective Chief Complaint Information obtained from Patient Left lower extremity wounds and right upper leg wound History of Present Illness (HPI) Admission 04/30/2021 Ms. Latanja Lehenbauer is a 70 year old female with a past medical history of insulin-dependent type 2 diabetes with polyneuropathy, hypertension, and left BKA from  necrotizing cellulitis. She has been seen in our clinic multiple times last seen in 03/2019 for right toe wounds. Today she presents for 3 wounds. 1 is located to the right upper leg and 2 to the left lower extremity. They started at the end of December And not sure how I started. She had not been using any dressings up until 2 weeks ago. She is currently using silver alginate to the right upper leg and Santyl to the wounds on the left leg. She has been on doxycycline and 2 rounds of clindamycin for cellulitis to these wounds. She finished her last round of antibiotics 3 weeks ago. She reports minimal pain to the wound. She currently denies signs of infection including increased erythema, warmth or purulent drainage. 7/5; patient presents for 1 week follow-up. She has been using Santyl to the left lower extremity wounds and silver alginate to the right upper leg wound. She denies signs of infection. She has no complaints today. 7/19; patient presents for 1 week follow-up. She has been using Santyl to the left lower extremity wounds and silver alginate to the right upper leg wound. She denies signs of infection. She has had to use a lot of Santyl to the lateral wound since the necrotic tissue has been breaking down and the wound is deeper. 8/4; patient presents for 2-week follow-up. She has been using Santyl to the left lower extremity wound and silver alginate to the right upper leg wound. She has been using the wound VAC to the left lateral wound. She has no issues or complaints today. She denies signs of infection. 8/18; patient presents for 2-week follow-up. She has been using Santyl and silver alginate to the right upper wound. She reports that this is deeper today. She has been using the wound VAC to the left lateral wound. She has no issues or complaints today. She denies signs of infection. She reports that the left lower extremity medial wound is healed 9/1; patient presents for 2-week  follow-up. She continues to use silver alginate to the right upper wound. She is using the wound VAC to the left lateral wound. She has no issues today. She denies signs of infection. She overall feels well. 10/10; patient presents for follow-up. Patient has not followed up in  a month. She states that her husband was recently in the ICU but is now at home. She reports an increase in the wound size to the right lower extremity. She reports improvement to the left lower extremity wound. She currently denies signs of infection. She states she overall feels well. Patient History Information obtained from Patient. Family History Cancer - Child, Diabetes - Father, Heart Disease - Mother,Father, Hypertension - Mother,Father, No family history of Hereditary Spherocytosis, Kidney Disease, Lung Disease, Seizures, Stroke, Thyroid Problems, Tuberculosis. Social History Never smoker, Marital Status - Married, Alcohol Use - Never, Drug Use - No History, Caffeine Use - Daily - coffee , sodas. Medical History Eyes Patient has history of Cataracts - lens implant done, Glaucoma Denies history of Optic Neuritis Ear/Nose/Mouth/Throat Patient has history of Chronic sinus problems/congestion - seasonal Denies history of Middle ear problems Hematologic/Lymphatic Denies history of Anemia, Hemophilia, Human Immunodeficiency Virus, Lymphedema, Sickle Cell Disease Respiratory Patient has history of Asthma Denies history of Aspiration, Chronic Obstructive Pulmonary Disease (COPD), Pneumothorax, Sleep Apnea, Tuberculosis Cardiovascular Patient has history of Hypertension - not on meds presently Denies history of Angina, Arrhythmia, Congestive Heart Failure, Coronary Artery Disease, Deep Vein Thrombosis, Hypotension, Myocardial Infarction, Peripheral Arterial Disease, Peripheral Venous Disease, Phlebitis, Vasculitis Gastrointestinal Denies history of Cirrhosis , Colitis, Crohnoos, Hepatitis A, Hepatitis B, Hepatitis  C Endocrine Patient has history of Type II Diabetes Denies history of Type I Diabetes Genitourinary Denies history of End Stage Renal Disease Immunological Denies history of Lupus Erythematosus, Raynaudoos, Scleroderma Integumentary (Skin) Denies history of History of Burn Musculoskeletal Patient has history of Osteoarthritis Denies history of Gout, Rheumatoid Arthritis, Osteomyelitis Neurologic Patient has history of Neuropathy Denies history of Dementia, Quadriplegia, Paraplegia, Seizure Disorder Oncologic Denies history of Received Chemotherapy, Received Radiation Psychiatric Denies history of Anorexia/bulimia, Confinement Anxiety Medical A Surgical History Notes nd Cardiovascular Hyperlipidemia Gastrointestinal GERD, Diverticulitis Genitourinary Overactive Bladder Musculoskeletal Fibromyalgia, toe amputations x 3 Psychiatric Anxiety Objective Constitutional respirations regular, non-labored and within target range for patient.. Vitals Time Taken: 10:37 AM, Height: 63 in, Source: Stated, Weight: 279 lbs, Source: Stated, BMI: 49.4, Temperature: 97.9 F, Pulse: 80 bpm, Respiratory Rate: 18 breaths/min, Blood Pressure: 136/80 mmHg, Capillary Blood Glucose: 131 mg/dl. General Notes: glucose per pt report this am Psychiatric pleasant and cooperative. General Notes: Right lower extremity: Upper leg has an open wound with granulation tissue present, Tissue appears healthy. Left lower extremity: Lateral side has a large open wound with nonviable tissue and granulation tissue present. Integumentary (Hair, Skin) Wound #6 status is Open. Original cause of wound was Gradually Appeared. The date acquired was: 11/06/2020. The wound has been in treatment 15 weeks. The wound is located on the Right Upper Leg. The wound measures 1.5cm length x 2cm width x 1.5cm depth; 2.356cm^2 area and 3.534cm^3 volume. There is Fat Layer (Subcutaneous Tissue) exposed. There is no tunneling or  undermining noted. There is a medium amount of purulent drainage noted. The wound margin is distinct with the outline attached to the wound base. There is large (67-100%) red, pink granulation within the wound bed. There is a small (1-33%) amount of necrotic tissue within the wound bed including Adherent Slough. Wound #8 status is Open. Original cause of wound was Gradually Appeared. The date acquired was: 01/06/2021. The wound has been in treatment 15 weeks. The wound is located on the Left,Lateral Upper Leg. The wound measures 9.5cm length x 6cm width x 3.6cm depth; 44.768cm^2 area and 161.164cm^3 volume. There is muscle and Fat Layer (Subcutaneous Tissue)  exposed. There is no tunneling or undermining noted. There is a large amount of purulent drainage noted. Foul odor after cleansing was noted. The wound margin is distinct with the outline attached to the wound base. There is large (67-100%) red, friable granulation within the wound bed. There is a small (1-33%) amount of necrotic tissue within the wound bed including Adherent Slough. Assessment Active Problems ICD-10 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ity Non-pressure chronic ulcer of other part of right lower leg with unspecified severity Type 2 diabetes mellitus without complications Acquired absence of left leg below knee Patient's right lower extremity wound is stable. There is slight increase in depth. I recommended switching from silver alginate to Dakin's wet-to-dry. I think the gauze will help keep the area from folding on itself. This is a tough area to treat since it inside a skin fold. The left lateral wound is improving in size and appearance since last clinic visit. She had an ultrasound of this area that showed no focal fluid collection. I recommended continuing the VAC to this area. No signs of infection on exam. Plan Follow-up Appointments: Return Appointment in 2 weeks. - Dr.  Heber [place] ***60 minutes extra time*** Bathing/ Shower/ Hygiene: Other Bathing/Shower/Hygiene Orders/Instructions: - You may sponge bathe Negative Presssure Wound Therapy: Wound #8 Left,Lateral Upper Leg: Wound Vac to wound continuously at 148m/hg pressure - change three times a week. Send measurements weekly. Black Foam Off-Loading: Turn and reposition every 2 hours WOUND #6: - Upper Leg Wound Laterality: Right Cleanser: Wound Cleanser (Generic) 1 x Per Day/15 Days Discharge Instructions: Cleanse the wound with wound cleanser prior to applying a clean dressing using gauze sponges, not tissue or cotton balls. Peri-Wound Care: Ketoconazole Cream 2% 1 x Per Day/15 Days Discharge Instructions: In office only*** patient to apply over the counter antifungal cream.***Apply Ketoconazole as directed Topical: Dakins Solution 1 x Per Day/15 Days Discharge Instructions: Moisten gauze with Dakins and pack into wound Secondary Dressing: Woven Gauze Sponge, Non-Sterile 4x4 in (Generic) 1 x Per Day/15 Days Discharge Instructions: Apply over primary dressing as directed. Secondary Dressing: ABD Pad, 5x9 (Generic) 1 x Per Day/15 Days Discharge Instructions: Apply over primary dressing as directed. Secured With: 71M Medipore H Soft Cloth Surgical T 4 x 2 (in/yd) (Generic) 1 x Per Day/15 Days ape Discharge Instructions: Secure dressing with tape as directed. WOUND #8: - Upper Leg Wound Laterality: Left, Lateral Cleanser: Anasept Antimicrobial Skin and Wound Cleanser, 8 (oz) 3 x Per Week/15 Days Discharge Instructions: Cleanse the wound with Anasept cleanser prior to applying a clean dressing using gauze sponges, not tissue or cotton balls. 1. Dakin's wet-to-dry to the right lower extremity 2. Wound VAC to the left lower extremity 3. Follow-up in 2 weeks Electronic Signature(s) Signed: 08/17/2021 12:06:10 PM By: HKalman ShanDO Entered By: HKalman Shanon 08/17/2021  12:05:35 -------------------------------------------------------------------------------- HxROS Details Patient Name: Date of Service: [place] RJoya San [name] 08/17/2021 10:45 A M Medical Record Number: [id number] Account Number: [id number] of Birth/Sex: Treating RN: [birth year](70y.o. FSue LushPrimary Care Provider: AClovia CuffOther Clinician: Referring Provider: Treating Provider/Extender: HAlysia Pennain Treatment: 15 Information Obtained From Patient Eyes Medical History: Positive for: Cataracts - lens implant done; Glaucoma Negative for: Optic Neuritis Ear/Nose/Mouth/Throat Medical History: Positive for: Chronic sinus problems/congestion - seasonal Negative for: Middle ear problems Hematologic/Lymphatic Medical History: Negative for: Anemia; Hemophilia; Human Immunodeficiency Virus; Lymphedema; Sickle Cell Disease Respiratory Medical  History: Positive for: Asthma Negative for: Aspiration; Chronic Obstructive Pulmonary Disease (COPD); Pneumothorax; Sleep Apnea; Tuberculosis Cardiovascular Medical History: Positive for: Hypertension - not on meds presently Negative for: Angina; Arrhythmia; Congestive Heart Failure; Coronary Artery Disease; Deep Vein Thrombosis; Hypotension; Myocardial Infarction; Peripheral Arterial Disease; Peripheral Venous Disease; Phlebitis; Vasculitis Past Medical History Notes: Hyperlipidemia Gastrointestinal Medical History: Negative for: Cirrhosis ; Colitis; Crohns; Hepatitis A; Hepatitis B; Hepatitis C Past Medical History Notes: GERD, Diverticulitis Endocrine Medical History: Positive for: Type II Diabetes Negative for: Type I Diabetes Time with diabetes: 12 years Treated with: Insulin Blood sugar tested every day: Yes T ested : bid Blood sugar testing results: Breakfast: 140 max; Bedtime: 220's max Genitourinary Medical History: Negative for: End Stage Renal Disease Past Medical  History Notes: Overactive Bladder Immunological Medical History: Negative for: Lupus Erythematosus; Raynauds; Scleroderma Integumentary (Skin) Medical History: Negative for: History of Burn Musculoskeletal Medical History: Positive for: Osteoarthritis Negative for: Gout; Rheumatoid Arthritis; Osteomyelitis Past Medical History Notes: Fibromyalgia, toe amputations x 3 Neurologic Medical History: Positive for: Neuropathy Negative for: Dementia; Quadriplegia; Paraplegia; Seizure Disorder Oncologic Medical History: Negative for: Received Chemotherapy; Received Radiation Psychiatric Medical History: Negative for: Anorexia/bulimia; Confinement Anxiety Past Medical History Notes: Anxiety HBO Extended History Items Ear/Nose/Mouth/Throat: Eyes: Eyes: Chronic sinus Cataracts Glaucoma problems/congestion Immunizations Pneumococcal Vaccine: Received Pneumococcal Vaccination: Yes Received Pneumococcal Vaccination On or After 60th Birthday: No Immunization Notes: up to date on pneumonia and tetanus shots Implantable Devices None Family and Social History Cancer: Yes - Child; Diabetes: Yes - Father; Heart Disease: Yes - Mother,Father; Hereditary Spherocytosis: No; Hypertension: Yes - Mother,Father; Kidney Disease: No; Lung Disease: No; Seizures: No; Stroke: No; Thyroid Problems: No; Tuberculosis: No; Never smoker; Marital Status - Married; Alcohol Use: Never; Drug Use: No History; Caffeine Use: Daily - coffee , sodas; Financial Concerns: No; Food, Clothing or Shelter Needs: No; Support System Lacking: No; Transportation Concerns: No Electronic Signature(s) Signed: 08/17/2021 12:06:10 PM By: Kalman Shan DO Signed: 08/17/2021 5:11:49 PM By: Lorrin Jackson Entered By: Kalman Shan on 08/17/2021 12:02:07 -------------------------------------------------------------------------------- SuperBill Details Patient Name: Date of Service: [name], [name] 08/17/2021 Medical  Record Number: [id number] Patient Account Number: [id number] Date of Birth/Sex: Treating RN: 17-Jul-1951 (70 y.o. [name] Primary Care Provider: [name] Other Clinician: Referring Provider: Treating Provider/Extender: Alysia Penna in Treatment: 15 Diagnosis Coding ICD-10 Codes Code Description [phone] 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ity L97.819 Non-pressure chronic ulcer of other part of right lower leg with unspecified severity E11.9 Type 2 diabetes mellitus without complications P80.998 Acquired absence of left leg below knee Facility Procedures CPT4 Code: [id number] Description: 99213 - WOUND CARE VISIT-LEV 3 EST PT Modifier: Quantity: 1 Physician Procedures : CPT4 Code Description Modifier [id number] - WC PHYS LEVEL 3 - EST PT ICD-10 Diagnosis Description 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ity L97.819 Non-pressure chronic ulcer of other part of right  lower leg with unspecified severity E11.9 Type 2 diabetes mellitus without complications P37.902 Acquired absence of left leg below knee Quantity: 1 Electronic Signature(s) Signed: 08/17/2021 12:06:10 PM By: Kalman Shan DO Entered By: Kalman Shan on 08/17/2021 12:05:52

## 2021-08-18 NOTE — Progress Notes (Signed)
WILLETTE, MUDRY ([id number]) Visit Report for 08/17/2021 Arrival Information Details Patient Name: Date of Service: [name], [name] 08/17/2021 10:45 A M Medical Record Number: [id number] Patient Account Number: [id number] Date of Birth/Sex: Treating RN: Aug 22, 1951 (70 y.o. [name] Primary Care Kaitlyne Friedhoff: [name] Other Clinician: Referring Azari Janssens: Treating Taren Toops/Extender: Alysia Penna in Treatment: 15 Visit Information History Since Last Visit Added or deleted any medications: No Patient Arrived: Stretcher Any new allergies or adverse reactions: No Arrival Time: 10:33 Had a fall or experienced change in No Accompanied By: daughter activities of daily living that may affect Transfer Assistance: Stretcher risk of falls: Patient Identification Verified: Yes Signs or symptoms of abuse/neglect since last visito No Secondary Verification Process Completed: Yes Hospitalized since last visit: No Patient Requires Transmission-Based Precautions: No Implantable device outside of the clinic excluding No Patient Has Alerts: No cellular tissue based products placed in the center since last visit: Has Dressing in Place as Prescribed: Yes Pain Present Now: No Electronic Signature(s) Signed: 08/17/2021 4:58:11 PM By: Baruch Gouty RN, BSN Entered By: Baruch Gouty on 08/17/2021 10:36:17 -------------------------------------------------------------------------------- Clinic Level of Care Assessment Details Patient Name: Date of Service: MO 61, [name] 08/17/2021 10:45 A M Medical Record Number: [id number] Patient Account Number: [id number] Date of Birth/Sex: Treating RN: Jul 14, 1951 (70 y.o. [name] Primary Care Duc Crocket: [name] Other Clinician: Referring Alexsandria Kivett: Treating Lanie Schelling/Extender: Alysia Penna in Treatment: 15 Clinic Level of Care Assessment Items TOOL 4 Quantity  Score X- 1 0 Use when only an EandM is performed on FOLLOW-UP visit ASSESSMENTS - Nursing Assessment / Reassessment X- 1 10 Reassessment of Co-morbidities (includes updates in patient status) X- 1 5 Reassessment of Adherence to Treatment Plan ASSESSMENTS - Wound and Skin A ssessment / Reassessment []  - 0 Simple Wound Assessment / Reassessment - one wound X- 2 5 Complex Wound Assessment / Reassessment - multiple wounds []  - 0 Dermatologic / Skin Assessment (not related to wound area) ASSESSMENTS - Focused Assessment []  - 0 Circumferential Edema Measurements - multi extremities []  - 0 Nutritional Assessment / Counseling / Intervention []  - 0 Lower Extremity Assessment (monofilament, tuning fork, pulses) []  - 0 Peripheral Arterial Disease Assessment (using hand held doppler) ASSESSMENTS - Ostomy and/or Continence Assessment and Care []  - 0 Incontinence Assessment and Management []  - 0 Ostomy Care Assessment and Management (repouching, etc.) PROCESS - Coordination of Care []  - 0 Simple Patient / Family Education for ongoing care X- 1 20 Complex (extensive) Patient / Family Education for ongoing care []  - 0 Staff obtains Programmer, systems, Records, T Results / Process Orders est []  - 0 Staff telephones HHA, Nursing Homes / Clarify orders / etc []  - 0 Routine Transfer to another Facility (non-emergent condition) []  - 0 Routine Hospital Admission (non-emergent condition) []  - 0 New Admissions / Biomedical engineer / Ordering NPWT Apligraf, etc. , []  - 0 Emergency Hospital Admission (emergent condition) []  - 0 Simple Discharge Coordination []  - 0 Complex (extensive) Discharge Coordination PROCESS - Special Needs []  - 0 Pediatric / Minor Patient Management []  - 0 Isolation Patient Management []  - 0 Hearing / Language / Visual special needs []  - 0 Assessment of Community assistance (transportation, D/C planning, etc.) []  - 0 Additional assistance / Altered  mentation []  - 0 Support Surface(s) Assessment (bed, cushion, seat, etc.) INTERVENTIONS - Wound Cleansing / Measurement []  - 0 Simple Wound Cleansing - one wound X- 2 5 Complex  Wound Cleansing - multiple wounds X- 1 5 Wound Imaging (photographs - any number of wounds) []  - 0 Wound Tracing (instead of photographs) []  - 0 Simple Wound Measurement - one wound X- 2 5 Complex Wound Measurement - multiple wounds INTERVENTIONS - Wound Dressings []  - 0 Small Wound Dressing one or multiple wounds X- 2 15 Medium Wound Dressing one or multiple wounds []  - 0 Large Wound Dressing one or multiple wounds []  - 0 Application of Medications - topical []  - 0 Application of Medications - injection INTERVENTIONS - Miscellaneous []  - 0 External ear exam []  - 0 Specimen Collection (cultures, biopsies, blood, body fluids, etc.) []  - 0 Specimen(s) / Culture(s) sent or taken to Lab for analysis []  - 0 Patient Transfer (multiple staff / Civil Service fast streamer / Similar devices) []  - 0 Simple Staple / Suture removal (25 or less) []  - 0 Complex Staple / Suture removal (26 or more) []  - 0 Hypo / Hyperglycemic Management (close monitor of Blood Glucose) []  - 0 Ankle / Brachial Index (ABI) - do not check if billed separately X- 1 5 Vital Signs Has the patient been seen at the hospital within the last three years: Yes Total Score: 105 Level Of Care: New/Established - Level 3 Electronic Signature(s) Signed: 08/17/2021 4:58:11 PM By: Baruch Gouty RN, BSN Entered By: Baruch Gouty on 08/17/2021 11:08:52 -------------------------------------------------------------------------------- Complex / Palliative Patient Assessment Details Patient Name: Date of Service: MO 30, [name] 08/17/2021 10:45 A M Medical Record Number: [id number] Patient Account Number: [id number] Date of Birth/Sex: Treating RN: 10/24/1951 (70 y.o. Nancy Fetter Primary Care Lun Muro: [name] Other  Clinician: Referring Leza Apsey: Treating Carney Saxton/Extender: Alysia Penna in Treatment: 15 Palliative Management Criteria Complex Wound Management Criteria Patient has remarkable or complex co-morbidities requiring medications or treatments that extend wound healing times. Examples: Diabetes mellitus with chronic renal failure or end stage renal disease requiring dialysis Advanced or poorly controlled rheumatoid arthritis Diabetes mellitus and end stage chronic obstructive pulmonary disease Active cancer with current chemo- or radiation therapy Type 2 Diabetes, Non ambulatory, bedbound Care Approach Wound Care Plan: Complex Wound Management Electronic Signature(s) Signed: 08/17/2021 4:58:30 PM By: Levan Hurst RN, BSN Signed: 08/18/2021 12:41:04 PM By: Kalman Shan DO Entered By: Levan Hurst on 08/17/2021 14:56:32 -------------------------------------------------------------------------------- Encounter Discharge Information Details Patient Name: Date of Service: [name], [name] 08/17/2021 10:45 A M Medical Record Number: [id number] Patient Account Number: [id number] Date of Birth/Sex: Treating RN: Jun 05, 1951 (70 y.o. [name] Primary Care Kipper Buch: [name] Other Clinician: Referring Wilhemenia Camba: Treating Ziah Turvey/Extender: Alysia Penna in Treatment: 15 Encounter Discharge Information Items Discharge Condition: Stable Ambulatory Status: Stretcher Discharge Destination: Home Transportation: Ambulance Accompanied By: daughter Schedule Follow-up Appointment: Yes Clinical Summary of Care: Provided on 08/17/2021 Form Type Recipient Paper Patient Patient Electronic Signature(s) Signed: 08/17/2021 4:58:11 PM By: Baruch Gouty RN, BSN Entered By: Baruch Gouty on 08/17/2021 11:25:35 -------------------------------------------------------------------------------- Lower Extremity Assessment  Details Patient Name: Date of Service: [name], [name] 08/17/2021 10:45 A M Medical Record Number: [id number] Patient Account Number: [id number] Date of Birth/Sex: Treating RN: [birth year] (70 y.o. [name] Primary Care Lowen Barringer: [name] Other Clinician: Referring Sharri Loya: Treating Yoav Okane/Extender: Alysia Penna in Treatment: 15 Electronic Signature(s) Signed: 08/17/2021 4:58:11 PM By: Baruch Gouty RN, BSN Entered By: Baruch Gouty on 08/17/2021 10:39:39 -------------------------------------------------------------------------------- Multi Wound Chart Details Patient Name: Date of Service: [place] RTO N, [place] RGA  RET D. 08/17/2021 10:45 A M Medical Record Number: [id number] Patient Account Number: [id number] Date of Birth/Sex: Treating RN: 09-29-1951 (70 y.o. [name] Primary Care Shandy Vi: [name] Other Clinician: Referring Antar Milks: Treating Grayer Sproles/Extender: Alysia Penna in Treatment: 15 Vital Signs Height(in): 89 Capillary Blood Glucose(mg/dl): 131 Weight(lbs): 279 Pulse(bpm): 32 Body Mass Index(BMI): 48 Blood Pressure(mmHg): 136/80 Temperature(F): 97.9 Respiratory Rate(breaths/min): 18 Photos: [N/A:N/A] Right Upper Leg Left, Lateral Upper Leg N/A Wound Location: Gradually Appeared Gradually Appeared N/A Wounding Event: Diabetic Wound/Ulcer of the Lower Diabetic Wound/Ulcer of the Lower N/A Primary Etiology: Extremity Extremity Cataracts, Glaucoma, Chronic sinus Cataracts, Glaucoma, Chronic sinus N/A Comorbid History: problems/congestion, Asthma, problems/congestion, Asthma, Hypertension, Type II Diabetes, Hypertension, Type II Diabetes, Osteoarthritis, Neuropathy Osteoarthritis, Neuropathy 11/06/2020 01/06/2021 N/A Date Acquired: 15 15 N/A Weeks of Treatment: Open Open N/A Wound Status: 1.5x2x1.5 9.5x6x3.6 N/A Measurements L x W x D (cm) 2.356 44.768 N/A A (cm)  : rea 3.534 161.164 N/A Volume (cm) : 91.70% 60.40% N/A % Reduction in Area: 87.50% 43.00% N/A % Reduction in Volume: Grade 2 Grade 2 N/A Classification: Medium Large N/A Exudate A mount: Purulent Purulent N/A Exudate Type: yellow, brown, green yellow, brown, green N/A Exudate Color: No Yes N/A Foul Odor A Cleansing: fter N/A No N/A Odor A nticipated Due to Product Use: Distinct, outline attached Distinct, outline attached N/A Wound Margin: Large (67-100%) Large (67-100%) N/A Granulation A mount: Red, Pink Red, Friable N/A Granulation Quality: Small (1-33%) Small (1-33%) N/A Necrotic A mount: Fat Layer (Subcutaneous Tissue): Yes Fat Layer (Subcutaneous Tissue): Yes N/A Exposed Structures: Fascia: No Muscle: Yes Tendon: No Fascia: No Muscle: No Tendon: No Joint: No Joint: No Bone: No Bone: No Medium (34-66%) Small (1-33%) N/A Epithelialization: Treatment Notes Wound #6 (Upper Leg) Wound Laterality: Right Cleanser Wound Cleanser Discharge Instruction: Cleanse the wound with wound cleanser prior to applying a clean dressing using gauze sponges, not tissue or cotton balls. Peri-Wound Care Ketoconazole Cream 2% Discharge Instruction: In office only*** patient to apply over the counter antifungal cream.***Apply Ketoconazole as directed Topical Dakins Solution Discharge Instruction: Moisten gauze with Dakins and pack into wound Primary Dressing Secondary Dressing Woven Gauze Sponge, Non-Sterile 4x4 in Discharge Instruction: Apply over primary dressing as directed. ABD Pad, 5x9 Discharge Instruction: Apply over primary dressing as directed. Secured With 50M Medipore H Soft Cloth Surgical T 4 x 2 (in/yd) ape Discharge Instruction: Secure dressing with tape as directed. Compression Wrap Compression Stockings Add-Ons Wound #8 (Upper Leg) Wound Laterality: Left, Lateral Cleanser Anasept Antimicrobial Skin and Wound Cleanser, 8 (oz) Discharge Instruction:  Cleanse the wound with Anasept cleanser prior to applying a clean dressing using gauze sponges, not tissue or cotton balls. Peri-Wound Care Topical Primary Dressing Secondary Dressing Secured With Compression Wrap Compression Stockings Add-Ons Electronic Signature(s) Signed: 08/17/2021 12:06:10 PM By: Kalman Shan DO Signed: 08/17/2021 5:11:49 PM By: Lorrin Jackson Entered By: Kalman Shan on 08/17/2021 12:00:04 -------------------------------------------------------------------------------- Multi-Disciplinary Care Plan Details Patient Name: Date of Service: [name], [name] 08/17/2021 10:45 A M Medical Record Number: [id number] Patient Account Number: [id number] Date of Birth/Sex: Treating RN: 04/06/1951 (70 y.o. [name] Primary Care Brandilee Pies: [name] Other Clinician: Referring Jameyah Fennewald: Treating Hawley Pavia/Extender: Alysia Penna in Treatment: 15 Active Inactive Wound/Skin Impairment Nursing Diagnoses: Impaired tissue integrity Knowledge deficit related to ulceration/compromised skin integrity Goals: Patient will demonstrate a reduced rate of smoking or cessation of smoking Date Initiated: 04/30/2021 Target Resolution Date: 08/31/2021 Goal Status: Active Patient/caregiver will  verbalize understanding of skin care regimen Date Initiated: 04/30/2021 Date Inactivated: 07/09/2021 Target Resolution Date: 07/10/2021 Goal Status: Met Ulcer/skin breakdown will have a volume reduction of 50% by week 8 Date Initiated: 07/09/2021 Target Resolution Date: 08/31/2021 Goal Status: Active Interventions: Assess patient/caregiver ability to obtain necessary supplies Assess patient/caregiver ability to perform ulcer/skin care regimen upon admission and as needed Assess ulceration(s) every visit Notes: Electronic Signature(s) Signed: 08/17/2021 4:58:11 PM By: Baruch Gouty RN, BSN Entered By: Baruch Gouty on 08/17/2021  10:54:50 -------------------------------------------------------------------------------- Pain Assessment Details Patient Name: Date of Service: [name], [name] 08/17/2021 10:45 A M Medical Record Number: [id number] Patient Account Number: [id number] Date of Birth/Sex: Treating RN: [birth year] (70 y.o. [name] Primary Care Vishaal Strollo: [name] Other Clinician: Referring Shajuan Musso: Treating Mireille Lacombe/Extender: Alysia Penna in Treatment: 15 Active Problems Location of Pain Severity and Description of Pain Patient Has Paino No Site Locations Rate the pain. Rate the pain. Current Pain Level: 0 Pain Management and Medication Current Pain Management: Electronic Signature(s) Signed: 08/17/2021 4:58:11 PM By: Baruch Gouty RN, BSN Entered By: Baruch Gouty on 08/17/2021 10:39:06 -------------------------------------------------------------------------------- Patient/Caregiver Education Details Patient Name: Date of Service: [name], [name] 10/10/2022andnbsp10:45 A M Medical Record Number: [id number] Patient Account Number: [id number] Date of Birth/Gender: Treating RN: 02/16/1951 (71 y.o. [name] Primary Care Physician: [name] Other Clinician: Referring Physician: Treating Physician/Extender: Alysia Penna in Treatment: 15 Education Assessment Education Provided To: Patient and Caregiver Education Topics Provided Pressure: Methods: Explain/Verbal, Printed Responses: State content correctly Wound/Skin Impairment: Methods: Explain/Verbal, Printed Responses: State content correctly Electronic Signature(s) Signed: 08/17/2021 4:58:11 PM By: Baruch Gouty RN, BSN Entered By: Baruch Gouty on 08/17/2021 10:55:24 -------------------------------------------------------------------------------- Wound Assessment Details Patient Name: Date of Service: [name], [place] RET D.  08/17/2021 10:45 A M Medical Record Number: [id number] Patient Account Number: [id number] Date of Birth/Sex: Treating RN: 03-30-1951 (70 y.o. [name] Primary Care Evalynn Hankins: [name] Other Clinician: Referring Kalana Yust: Treating Varina Hulon/Extender: Alysia Penna in Treatment: 15 Wound Status Wound Number: 6 Primary Diabetic Wound/Ulcer of the Lower Extremity Etiology: Wound Location: Right Upper Leg Wound Open Wounding Event: Gradually Appeared Status: Date Acquired: 11/06/2020 Comorbid Cataracts, Glaucoma, Chronic sinus problems/congestion, Asthma, Weeks Of Treatment: 15 History: Hypertension, Type II Diabetes, Osteoarthritis, Neuropathy Clustered Wound: No Photos Wound Measurements Length: (cm) 1.5 Width: (cm) 2 Depth: (cm) 1.5 Area: (cm) 2.356 Volume: (cm) 3.534 % Reduction in Area: 91.7% % Reduction in Volume: 87.5% Epithelialization: Medium (34-66%) Tunneling: No Undermining: No Wound Description Classification: Grade 2 Wound Margin: Distinct, outline attached Exudate Amount: Medium Exudate Type: Purulent Exudate Color: yellow, brown, green Foul Odor After Cleansing: No Slough/Fibrino Yes Wound Bed Granulation Amount: Large (67-100%) Exposed Structure Granulation Quality: Red, Pink Fascia Exposed: No Necrotic Amount: Small (1-33%) Fat Layer (Subcutaneous Tissue) Exposed: Yes Necrotic Quality: Adherent Slough Tendon Exposed: No Muscle Exposed: No Joint Exposed: No Bone Exposed: No Treatment Notes Wound #6 (Upper Leg) Wound Laterality: Right Cleanser Wound Cleanser Discharge Instruction: Cleanse the wound with wound cleanser prior to applying a clean dressing using gauze sponges, not tissue or cotton balls. Peri-Wound Care Ketoconazole Cream 2% Discharge Instruction: In office only*** patient to apply over the counter antifungal cream.***Apply Ketoconazole as directed Topical Dakins Solution Discharge  Instruction: Moisten gauze with Dakins and pack into wound Primary Dressing Secondary Dressing Woven Gauze Sponge, Non-Sterile 4x4 in Discharge Instruction: Apply over primary dressing as directed. ABD Pad, 5x9  Discharge Instruction: Apply over primary dressing as directed. Secured With 31M Medipore H Soft Cloth Surgical T 4 x 2 (in/yd) ape Discharge Instruction: Secure dressing with tape as directed. Compression Wrap Compression Stockings Add-Ons Electronic Signature(s) Signed: 08/17/2021 4:58:11 PM By: Baruch Gouty RN, BSN Signed: 08/17/2021 5:11:49 PM By: Lorrin Jackson Entered By: Baruch Gouty on 08/17/2021 10:54:06 -------------------------------------------------------------------------------- Wound Assessment Details Patient Name: Date of Service: [name], [name] 08/17/2021 10:45 A M Medical Record Number: [id number] Patient Account Number: [id number] Date of Birth/Sex: Treating RN: 06-18-1951 (70 y.o. [name] Primary Care Merit Gadsby: [name] Other Clinician: Referring Kaidan Spengler: Treating Carlton Sweaney/Extender: Alysia Penna in Treatment: 15 Wound Status Wound Number: 8 Primary Diabetic Wound/Ulcer of the Lower Extremity Etiology: Wound Location: Left, Lateral Upper Leg Wound Open Wounding Event: Gradually Appeared Status: Date Acquired: 01/06/2021 Comorbid Cataracts, Glaucoma, Chronic sinus problems/congestion, Asthma, Weeks Of Treatment: 15 History: Hypertension, Type II Diabetes, Osteoarthritis, Neuropathy Clustered Wound: No Photos Wound Measurements Length: (cm) 9.5 Width: (cm) 6 Depth: (cm) 3.6 Area: (cm) 44.768 Volume: (cm) 161.164 % Reduction in Area: 60.4% % Reduction in Volume: 43% Epithelialization: Small (1-33%) Tunneling: No Undermining: No Wound Description Classification: Grade 2 Wound Margin: Distinct, outline attached Exudate Amount: Large Exudate Type: Purulent Exudate Color: yellow,  brown, green Foul Odor After Cleansing: Yes Due to Product Use: No Slough/Fibrino Yes Wound Bed Granulation Amount: Large (67-100%) Exposed Structure Granulation Quality: Red, Friable Fascia Exposed: No Necrotic Amount: Small (1-33%) Fat Layer (Subcutaneous Tissue) Exposed: Yes Necrotic Quality: Adherent Slough Tendon Exposed: No Muscle Exposed: Yes Necrosis of Muscle: No Joint Exposed: No Bone Exposed: No Treatment Notes Wound #8 (Upper Leg) Wound Laterality: Left, Lateral Cleanser Anasept Antimicrobial Skin and Wound Cleanser, 8 (oz) Discharge Instruction: Cleanse the wound with Anasept cleanser prior to applying a clean dressing using gauze sponges, not tissue or cotton balls. Peri-Wound Care Topical Primary Dressing Secondary Dressing Secured With Compression Wrap Compression Stockings Add-Ons Electronic Signature(s) Signed: 08/17/2021 4:58:11 PM By: Baruch Gouty RN, BSN Signed: 08/17/2021 5:11:49 PM By: Lorrin Jackson Entered By: Baruch Gouty on 08/17/2021 10:53:42 -------------------------------------------------------------------------------- Vitals Details Patient Name: Date of Service: [name], [name] 08/17/2021 10:45 A M Medical Record Number: [id number] Patient Account Number: [id number] Date of Birth/Sex: Treating RN: [birth year] (70 y.o. [name] Primary Care Phill Steck: [name] Other Clinician: Referring Alishba Naples: Treating Akansha Wyche/Extender: Alysia Penna in Treatment: 15 Vital Signs Time Taken: 10:37 Temperature (F): 97.9 Height (in): 63 Pulse (bpm): 80 Source: Stated Respiratory Rate (breaths/min): 18 Weight (lbs): 279 Blood Pressure (mmHg): 136/80 Source: Stated Capillary Blood Glucose (mg/dl): 131 Body Mass Index (BMI): 49.4 Reference Range: 80 - 120 mg / dl Notes glucose per pt report this am Electronic Signature(s) Signed: 08/17/2021 4:58:11 PM By: Baruch Gouty RN, BSN Entered  By: Baruch Gouty on 08/17/2021 10:38:23

## 2021-08-25 DIAGNOSIS — E1142 Type 2 diabetes mellitus with diabetic polyneuropathy: Secondary | ICD-10-CM | POA: Diagnosis not present

## 2021-08-25 DIAGNOSIS — M5030 Other cervical disc degeneration, unspecified cervical region: Secondary | ICD-10-CM | POA: Diagnosis not present

## 2021-08-25 DIAGNOSIS — M961 Postlaminectomy syndrome, not elsewhere classified: Secondary | ICD-10-CM | POA: Diagnosis not present

## 2021-08-25 DIAGNOSIS — G894 Chronic pain syndrome: Secondary | ICD-10-CM | POA: Diagnosis not present

## 2021-08-27 DIAGNOSIS — E1169 Type 2 diabetes mellitus with other specified complication: Secondary | ICD-10-CM | POA: Diagnosis not present

## 2021-08-27 DIAGNOSIS — L97829 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ity: Secondary | ICD-10-CM | POA: Diagnosis not present

## 2021-08-31 ENCOUNTER — Other Ambulatory Visit: Payer: Self-pay

## 2021-08-31 ENCOUNTER — Encounter (HOSPITAL_BASED_OUTPATIENT_CLINIC_OR_DEPARTMENT_OTHER): Payer: Medicare Other | Admitting: Internal Medicine

## 2021-08-31 DIAGNOSIS — L97829 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ity: Secondary | ICD-10-CM

## 2021-08-31 DIAGNOSIS — L97819 Non-pressure chronic ulcer of other part of right lower leg with unspecified severity: Secondary | ICD-10-CM

## 2021-08-31 DIAGNOSIS — M797 Fibromyalgia: Secondary | ICD-10-CM | POA: Diagnosis not present

## 2021-08-31 DIAGNOSIS — R5381 Other malaise: Secondary | ICD-10-CM | POA: Diagnosis not present

## 2021-08-31 DIAGNOSIS — Z89512 Acquired absence of left leg below knee: Secondary | ICD-10-CM

## 2021-08-31 DIAGNOSIS — Z833 Family history of diabetes mellitus: Secondary | ICD-10-CM | POA: Diagnosis not present

## 2021-08-31 DIAGNOSIS — R279 Unspecified lack of coordination: Secondary | ICD-10-CM | POA: Diagnosis not present

## 2021-08-31 DIAGNOSIS — E1142 Type 2 diabetes mellitus with diabetic polyneuropathy: Secondary | ICD-10-CM | POA: Diagnosis not present

## 2021-08-31 DIAGNOSIS — E119 Type 2 diabetes mellitus without complications: Secondary | ICD-10-CM

## 2021-08-31 DIAGNOSIS — N3281 Overactive bladder: Secondary | ICD-10-CM | POA: Diagnosis not present

## 2021-08-31 DIAGNOSIS — Z794 Long term (current) use of insulin: Secondary | ICD-10-CM | POA: Diagnosis not present

## 2021-08-31 DIAGNOSIS — Z8249 Family history of ischemic heart disease and other diseases of the circulatory system: Secondary | ICD-10-CM | POA: Diagnosis not present

## 2021-08-31 DIAGNOSIS — Z743 Need for continuous supervision: Secondary | ICD-10-CM | POA: Diagnosis not present

## 2021-08-31 DIAGNOSIS — E1151 Type 2 diabetes mellitus with diabetic peripheral angiopathy without gangrene: Secondary | ICD-10-CM | POA: Diagnosis not present

## 2021-08-31 NOTE — Progress Notes (Signed)
JEM, CASTRO ([id number]) Visit Report for 08/31/2021 Chief Complaint Document Details Patient Name: Date of Service: [name], [name] 08/31/2021 2:30 PM Medical Record Number: [id number] Patient Account Number: [id number] Date of Birth/Sex: Treating RN: 02-16-1951 (70 y.o. [name] Primary Care Provider: [name] Other Clinician: Referring Provider: Treating Provider/Extender: Alysia Penna in Treatment: 17 Information Obtained from: Patient Chief Complaint Left lower extremity wounds and right upper leg wound Electronic Signature(s) Signed: 08/31/2021 3:38:13 PM By: Kalman Shan DO Entered By: Kalman Shan on 08/31/2021 15:31:38 -------------------------------------------------------------------------------- HPI Details Patient Name: Date of Service: [name], [name] 08/31/2021 2:30 PM Medical Record Number: [id number] Patient Account Number: [id number] Date of Birth/Sex: Treating RN: 1951/07/14 (70 y.o. [name] Primary Care Provider: [name] Other Clinician: Referring Provider: Treating Provider/Extender: Alysia Penna in Treatment: 17 History of Present Illness HPI Description: Admission 04/30/2021 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 She has been seen in our clinic multiple times last seen in 03/2019 for right toe wounds. Today she presents for 3 wounds. 1 is located to the right upper leg and 2 to the left lower extremity. They started at the end of December And not sure how I started. She had not been using any dressings up until 2 weeks ago. She is currently using silver alginate to the right upper leg and Santyl to the wounds on the left leg. She has been on doxycycline and 2 rounds of clindamycin for cellulitis to these  wounds. She finished her last round of antibiotics 3 weeks ago. She reports minimal pain to the wound. She currently denies signs of infection including increased erythema, warmth or purulent drainage. 7/5; patient presents for 1 week follow-up. She has been using Santyl to the left lower extremity wounds and silver alginate to the right upper leg wound. She denies signs of infection. She has no complaints today. 7/19; patient presents for 1 week follow-up. She has been using Santyl to the left lower extremity wounds and silver alginate to the right upper leg wound. She denies signs of infection. She has had to use a lot of Santyl to the lateral wound since the necrotic tissue has been breaking down and the wound is deeper. 8/4; patient presents for 2-week follow-up. She has been using Santyl to the left lower extremity wound and silver alginate to the right upper leg wound. She has been using the wound VAC to the left lateral wound. She has no issues or complaints today. She denies signs of infection. 8/18; patient presents for 2-week follow-up. She has been using Santyl and silver alginate to the right upper wound. She reports that this is deeper today. She has been using the wound VAC to the left lateral wound. She has no issues or complaints today. She denies signs of infection. She reports that the left lower extremity medial wound is healed 9/1; patient presents for 2-week follow-up. She continues to use silver alginate to the right upper wound. She is using the wound VAC to the left lateral wound. She has no issues today. She denies signs of infection. She overall feels well. 10/10; patient presents for follow-up. Patient has not followed up in a month. She states that her husband was recently in the ICU but is now at home. She reports an increase in the wound size  to the right lower extremity. She reports improvement to the left lower extremity wound. She currently denies signs of infection.  She states she overall feels well. 10/24; patient presents for follow-up. She has a new wound to the right upper thigh. She has been keeping the area covered until yesterday when she started silver alginate. She continues to use Dakin wet-to-dry dressings to the other right upper leg wound. She continues to use the [facility] to the left lower extremity wound. No obvious signs of infection on exam. Electronic Signature(s) Signed: 08/31/2021 3:38:13 PM By: Kalman Shan DO Entered By: Kalman Shan on 08/31/2021 15:32:31 -------------------------------------------------------------------------------- Physical Exam Details Patient Name: Date of Service: [name], [name] 08/31/2021 2:30 PM Medical Record Number: [id number] Patient Account Number: [id number] Date of Birth/Sex: Treating RN: [birth year] (70 y.o. [name] Primary Care Provider: [name] Other Clinician: Referring Provider: Treating Provider/Extender: Alysia Penna in Treatment: 17 Constitutional respirations regular, non-labored and within target range for patient.Marland Kitchen Psychiatric pleasant and cooperative. Notes Right lower extremity: Upper leg has an open wound with granulation tissue. There is depth to the wound. It is inside a fold. New circular wound medial to the old wound with granulation tissue. No obvious signs of infection to either wound bed. Left lower extremity: Large open wound with scant granulation tissue and nonviable tissue. There are 2 areas of tunneling. No obvious signs of infection to this wound bed is well. Electronic Signature(s) Signed: 08/31/2021 3:38:13 PM By: Kalman Shan DO Entered By: Kalman Shan on 08/31/2021 15:34:23 -------------------------------------------------------------------------------- Physician Orders Details Patient Name: Date of Service: [name], [name] 08/31/2021 2:30 PM Medical Record Number: [id number] Patient Account  Number: [id number] Date of Birth/Sex: Treating RN: 1951/02/17 (70 y.o. [name] Primary Care Provider: [name] Other Clinician: Referring Provider: Treating Provider/Extender: Alysia Penna in Treatment: 17 Verbal / Phone Orders: No Diagnosis Coding ICD-10 Coding Code Description [phone] 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ity L97.819 Non-pressure chronic ulcer of other part of right lower leg with unspecified severity E11.9 Type 2 diabetes mellitus without complications R91.638 Acquired absence of left leg below knee Follow-up Appointments ppointment in 2 weeks. - Dr. Heber [place] ***60 minutes extra time*** Return A Bathing/ Shower/ Hygiene Other Bathing/Shower/Hygiene Orders/Instructions: - You may sponge bathe Negative Presssure Wound Therapy Wound #8 Left,Lateral Upper Leg Wound Vac to wound continuously at 139m/hg pressure - Change three times a week. Send measurements weekly. Black Foam White Foam - White foam to deep areas at 7:00 and 11:00 Off-Loading Turn and reposition every 2 hours Wound Treatment Wound #6 - Upper Leg Wound Laterality: Right Cleanser: Wound Cleanser (Generic) 1 x Per Day/15 Days Discharge Instructions: Cleanse the wound with wound cleanser prior to applying a clean dressing using gauze sponges, not tissue or cotton balls. Peri-Wound Care: Ketoconazole Cream 2% 1 x Per Day/15 Days Discharge Instructions: In office only*** patient to apply over the counter antifungal cream.***Apply Ketoconazole as directed Topical: Dakins Solution 1 x Per Day/15 Days Discharge Instructions: Moisten gauze with Dakins and pack into wound Secondary Dressing: Woven Gauze Sponge, Non-Sterile 4x4 in (Generic) 1 x Per Day/15 Days Discharge Instructions: Apply over primary dressing as directed. Secondary Dressing: ABD Pad, 5x9 (Generic) 1 x Per Day/15 Days Discharge Instructions: Apply over primary  dressing as directed. Secured With: 9M Medipore H Soft Cloth Surgical T 4 x 2 (in/yd) (Generic) 1 x Per Day/15 Days ape  Discharge Instructions: Secure dressing with tape as directed. Wound #8 - Upper Leg Wound Laterality: Left, Lateral Cleanser: Soap and Water 3 x Per Week/15 Days Discharge Instructions: May shower and wash wound with dial antibacterial soap and water prior to dressing change. Cleanser: Wound Cleanser 3 x Per Week/15 Days Discharge Instructions: Cleanse the wound with wound cleanser prior to applying a clean dressing using gauze sponges, not tissue or cotton balls. Prim Dressing: KerraCel Ag Gelling Fiber Dressing, 2x2 in (silver alginate) (DME) (Generic) 3 x Per Week/15 Days ary Discharge Instructions: Apply silver alginate to wound bed as instructed Secondary Dressing: Woven Gauze Sponges 2x2 in (DME) (Generic) 3 x Per Week/15 Days Discharge Instructions: Apply over primary dressing as directed. Secondary Dressing: ABD Pad, 5x9 (DME) (Generic) 3 x Per Week/15 Days Discharge Instructions: Apply over primary dressing as directed. Secured With: 50M Medipore H Soft Cloth Surgical T ape, 4 x 10 (in/yd) (DME) (Generic) 3 x Per Week/15 Days Discharge Instructions: Secure with tape as directed. Wound #9 - Upper Leg Wound Laterality: Right, Proximal Cleanser: Soap and Water 3 x Per Week/15 Days Discharge Instructions: May shower and wash wound with dial antibacterial soap and water prior to dressing change. Cleanser: Wound Cleanser 3 x Per Week/15 Days Discharge Instructions: Cleanse the wound with wound cleanser prior to applying a clean dressing using gauze sponges, not tissue or cotton balls. Prim Dressing: KerraCel Ag Gelling Fiber Dressing, 2x2 in (silver alginate) (DME) (Generic) 3 x Per Week/15 Days ary Discharge Instructions: Apply silver alginate to wound bed as instructed Secondary Dressing: Woven Gauze Sponges 2x2 in (DME) (Generic) 3 x Per Week/15 Days Discharge  Instructions: Apply over primary dressing as directed. Secondary Dressing: ABD Pad, 5x9 (DME) (Generic) 3 x Per Week/15 Days Discharge Instructions: Apply over primary dressing as directed. Secured With: 50M Medipore H Soft Cloth Surgical T ape, 4 x 10 (in/yd) (DME) (Generic) 3 x Per Week/15 Days Discharge Instructions: Secure with tape as directed. Electronic Signature(s) Signed: 08/31/2021 3:38:13 PM By: Kalman Shan DO Entered By: Kalman Shan on 08/31/2021 15:34:40 -------------------------------------------------------------------------------- Problem List Details Patient Name: Date of Service: [name], [name] 08/31/2021 2:30 PM Medical Record Number: [id number] Patient Account Number: [id number] Date of Birth/Sex: Treating RN: 1950-11-19 (70 y.o. [name] Primary Care Provider: [name] Other Clinician: Referring Provider: Treating Provider/Extender: Alysia Penna in Treatment: 17 Active Problems ICD-10 Encounter Code Description Active Date MDM Diagnosis L97.829 Non-pressure chronic ulcer of other part of left lower leg with unspecified 04/30/2021 No Yes severity L97.819 Non-pressure chronic ulcer of other part of right lower leg with unspecified 04/30/2021 No Yes severity E11.9 Type 2 diabetes mellitus without complications 6/38/4536 No Yes Z89.512 Acquired absence of left leg below knee 04/30/2021 No Yes Inactive Problems Resolved Problems Electronic Signature(s) Signed: 08/31/2021 3:38:13 PM By: Kalman Shan DO Entered By: Kalman Shan on 08/31/2021 15:31:19 -------------------------------------------------------------------------------- Progress Note Details Patient Name: Date of Service: [place], [name] 08/31/2021 2:30 PM Medical Record Number: [id number] Patient Account Number: [id number] Date of Birth/Sex: Treating RN: 12/27/1950 (70 y.o. [name] Primary Care Provider: [name]  Other Clinician: Referring Provider: Treating Provider/Extender: Alysia Penna in Treatment: 17 Subjective Chief Complaint Information obtained from Patient Left lower extremity wounds and right upper leg wound History of Present Illness (HPI) Admission 04/30/2021 [name] is a 70 year old female with a past medical history of insulin-dependent type 2 diabetes with polyneuropathy, hypertension, and left  BKA from necrotizing cellulitis. She has been seen in our clinic multiple times last seen in 03/2019 for right toe wounds. Today she presents for 3 wounds. 1 is located to the right upper leg and 2 to the left lower extremity. They started at the end of December And not sure how I started. She had not been using any dressings up until 2 weeks ago. She is currently using silver alginate to the right upper leg and Santyl to the wounds on the left leg. She has been on doxycycline and 2 rounds of clindamycin for cellulitis to these wounds. She finished her last round of antibiotics 3 weeks ago. She reports minimal pain to the wound. She currently denies signs of infection including increased erythema, warmth or purulent drainage. 7/5; patient presents for 1 week follow-up. She has been using Santyl to the left lower extremity wounds and silver alginate to the right upper leg wound. She denies signs of infection. She has no complaints today. 7/19; patient presents for 1 week follow-up. She has been using Santyl to the left lower extremity wounds and silver alginate to the right upper leg wound. She denies signs of infection. She has had to use a lot of Santyl to the lateral wound since the necrotic tissue has been breaking down and the wound is deeper. 8/4; patient presents for 2-week follow-up. She has been using Santyl to the left lower extremity wound and silver alginate to the right upper leg wound. She has been using the wound VAC to the left lateral wound. She  has no issues or complaints today. She denies signs of infection. 8/18; patient presents for 2-week follow-up. She has been using Santyl and silver alginate to the right upper wound. She reports that this is deeper today. She has been using the wound VAC to the left lateral wound. She has no issues or complaints today. She denies signs of infection. She reports that the left lower extremity medial wound is healed 9/1; patient presents for 2-week follow-up. She continues to use silver alginate to the right upper wound. She is using the wound VAC to the left lateral wound. She has no issues today. She denies signs of infection. She overall feels well. 10/10; patient presents for follow-up. Patient has not followed up in a month. She states that her husband was recently in the ICU but is now at home. She reports an increase in the wound size to the right lower extremity. She reports improvement to the left lower extremity wound. She currently denies signs of infection. She states she overall feels well. 10/24; patient presents for follow-up. She has a new wound to the right upper thigh. She has been keeping the area covered until yesterday when she started silver alginate. She continues to use Dakin wet-to-dry dressings to the other right upper leg wound. She continues to use the [employer] to the left lower extremity wound. No obvious signs of infection on exam. Patient History Information obtained from Patient. Family History Cancer - Child, Diabetes - Father, Heart Disease - Mother,Father, Hypertension - Mother,Father, No family history of Hereditary Spherocytosis, Kidney Disease, Lung Disease, Seizures, Stroke, Thyroid Problems, Tuberculosis. Social History Never smoker, Marital Status - Married, Alcohol Use - Never, Drug Use - No History, Caffeine Use - Daily - coffee , sodas. Medical History Eyes Patient has history of Cataracts - lens implant done, Glaucoma Denies history of Optic  Neuritis Ear/Nose/Mouth/Throat Patient has history of Chronic sinus problems/congestion - seasonal Denies history of Middle ear  problems Hematologic/Lymphatic Denies history of Anemia, Hemophilia, Human Immunodeficiency Virus, Lymphedema, Sickle Cell Disease Respiratory Patient has history of Asthma Denies history of Aspiration, Chronic Obstructive Pulmonary Disease (COPD), Pneumothorax, Sleep Apnea, Tuberculosis Cardiovascular Patient has history of Hypertension - not on meds presently Denies history of Angina, Arrhythmia, Congestive Heart Failure, Coronary Artery Disease, Deep Vein Thrombosis, Hypotension, Myocardial Infarction, Peripheral Arterial Disease, Peripheral Venous Disease, Phlebitis, Vasculitis Gastrointestinal Denies history of Cirrhosis , Colitis, Crohnoos, Hepatitis A, Hepatitis B, Hepatitis C Endocrine Patient has history of Type II Diabetes Denies history of Type I Diabetes Genitourinary Denies history of End Stage Renal Disease Immunological Denies history of Lupus Erythematosus, Raynaudoos, Scleroderma Integumentary (Skin) Denies history of History of Burn Musculoskeletal Patient has history of Osteoarthritis Denies history of Gout, Rheumatoid Arthritis, Osteomyelitis Neurologic Patient has history of Neuropathy Denies history of Dementia, Quadriplegia, Paraplegia, Seizure Disorder Oncologic Denies history of Received Chemotherapy, Received Radiation Psychiatric Denies history of Anorexia/bulimia, Confinement Anxiety Medical A Surgical History Notes nd Cardiovascular Hyperlipidemia Gastrointestinal GERD, Diverticulitis Genitourinary Overactive Bladder Musculoskeletal Fibromyalgia, toe amputations x 3 Psychiatric Anxiety Objective Constitutional respirations regular, non-labored and within target range for patient.. Vitals Time Taken: 2:25 PM, Height: 63 in, Weight: 279 lbs, BMI: 49.4, Temperature: 98.6 F, Pulse: 81 bpm, Respiratory Rate: 18  breaths/min, Blood Pressure: 124/82 mmHg, Capillary Blood Glucose: 131 mg/dl. Psychiatric pleasant and cooperative. General Notes: Right lower extremity: Upper leg has an open wound with granulation tissue. There is depth to the wound. It is inside a fold. New circular wound medial to the old wound with granulation tissue. No obvious signs of infection to either wound bed. Left lower extremity: Large open wound with scant granulation tissue and nonviable tissue. There are 2 areas of tunneling. No obvious signs of infection to this wound bed is well. Integumentary (Hair, Skin) Wound #6 status is Open. Original cause of wound was Gradually Appeared. The date acquired was: 11/06/2020. The wound has been in treatment 17 weeks. The wound is located on the Right Upper Leg. The wound measures 1.5cm length x 2cm width x 1.5cm depth; 2.356cm^2 area and 3.534cm^3 volume. There is Fat Layer (Subcutaneous Tissue) exposed. There is no tunneling or undermining noted. There is a medium amount of serosanguineous drainage noted. The wound margin is distinct with the outline attached to the wound base. There is large (67-100%) red, friable granulation within the wound bed. There is no necrotic tissue within the wound bed. Wound #8 status is Open. Original cause of wound was Gradually Appeared. The date acquired was: 01/06/2021. The wound has been in treatment 17 weeks. The wound is located on the Left,Lateral Upper Leg. The wound measures 11cm length x 6.5cm width x 8.5cm depth; 56.156cm^2 area and 477.326cm^3 volume. There is muscle and Fat Layer (Subcutaneous Tissue) exposed. There is no tunneling or undermining noted. There is a large amount of purulent drainage noted. The wound margin is distinct with the outline attached to the wound base. There is large (67-100%) red, friable granulation within the wound bed. There is a small (1-33%) amount of necrotic tissue within the wound bed including Adherent Slough. Wound  #9 status is Open. Original cause of wound was Other Lesion. The date acquired was: 08/19/2021. The wound is located on the Right,Proximal Upper Leg. The wound measures 0.9cm length x 2.1cm width x 0.2cm depth; 1.484cm^2 area and 0.297cm^3 volume. There is Fat Layer (Subcutaneous Tissue) exposed. There is no tunneling or undermining noted. There is a medium amount of serosanguineous drainage noted. The wound  margin is distinct with the outline attached to the wound base. There is large (67-100%) pink, pale granulation within the wound bed. There is a small (1-33%) amount of necrotic tissue within the wound bed including Adherent Slough. Assessment Active Problems ICD-10 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ity Non-pressure chronic ulcer of other part of right lower leg with unspecified severity Type 2 diabetes mellitus without complications Acquired absence of left leg below knee Patient's wounds are stable. I think she would benefit from white foam to the areas of tunneling to the left lower extremity wound. No obvious signs of infection on exam. She also has a new wound that she is not sure how it started on her right upper leg. I recommended using silver alginate to this and keeping the area covered with an ABD pad. Her right lower extremity wounds are tough to heal because of location. They are either inside a fold or covered by a fold. I recommended trying to offload these areas the best she could to help with healing. Follow-up in 2 weeks. Plan Follow-up Appointments: Return Appointment in 2 weeks. - Dr. Heber [place] ***60 minutes extra time*** Bathing/ Shower/ Hygiene: Other Bathing/Shower/Hygiene Orders/Instructions: - You may sponge bathe Negative Presssure Wound Therapy: Wound #8 Left,Lateral Upper Leg: Wound Vac to wound continuously at 166m/hg pressure - Change three times a week. Send measurements weekly. Black Foam White Foam - White foam  to deep areas at 7:00 and 11:00 Off-Loading: Turn and reposition every 2 hours WOUND #6: - Upper Leg Wound Laterality: Right Cleanser: Wound Cleanser (Generic) 1 x Per Day/15 Days Discharge Instructions: Cleanse the wound with wound cleanser prior to applying a clean dressing using gauze sponges, not tissue or cotton balls. Peri-Wound Care: Ketoconazole Cream 2% 1 x Per Day/15 Days Discharge Instructions: In office only*** patient to apply over the counter antifungal cream.***Apply Ketoconazole as directed Topical: Dakins Solution 1 x Per Day/15 Days Discharge Instructions: Moisten gauze with Dakins and pack into wound Secondary Dressing: Woven Gauze Sponge, Non-Sterile 4x4 in (Generic) 1 x Per Day/15 Days Discharge Instructions: Apply over primary dressing as directed. Secondary Dressing: ABD Pad, 5x9 (Generic) 1 x Per Day/15 Days Discharge Instructions: Apply over primary dressing as directed. Secured With: 42M Medipore H Soft Cloth Surgical T 4 x 2 (in/yd) (Generic) 1 x Per Day/15 Days ape Discharge Instructions: Secure dressing with tape as directed. WOUND #8: - Upper Leg Wound Laterality: Left, Lateral Cleanser: Soap and Water 3 x Per Week/15 Days Discharge Instructions: May shower and wash wound with dial antibacterial soap and water prior to dressing change. Cleanser: Wound Cleanser 3 x Per Week/15 Days Discharge Instructions: Cleanse the wound with wound cleanser prior to applying a clean dressing using gauze sponges, not tissue or cotton balls. Prim Dressing: KerraCel Ag Gelling Fiber Dressing, 2x2 in (silver alginate) (DME) (Generic) 3 x Per Week/15 Days ary Discharge Instructions: Apply silver alginate to wound bed as instructed Secondary Dressing: Woven Gauze Sponges 2x2 in (DME) (Generic) 3 x Per Week/15 Days Discharge Instructions: Apply over primary dressing as directed. Secondary Dressing: ABD Pad, 5x9 (DME) (Generic) 3 x Per Week/15 Days Discharge Instructions: Apply over  primary dressing as directed. Secured With: 42M Medipore H Soft Cloth Surgical T ape, 4 x 10 (in/yd) (DME) (Generic) 3 x Per Week/15 Days Discharge Instructions: Secure with tape as directed. WOUND #9: - Upper Leg Wound Laterality: Right, Proximal Cleanser: Soap and Water 3 x Per Week/15 Days Discharge Instructions: May  shower and wash wound with dial antibacterial soap and water prior to dressing change. Cleanser: Wound Cleanser 3 x Per Week/15 Days Discharge Instructions: Cleanse the wound with wound cleanser prior to applying a clean dressing using gauze sponges, not tissue or cotton balls. Prim Dressing: KerraCel Ag Gelling Fiber Dressing, 2x2 in (silver alginate) (DME) (Generic) 3 x Per Week/15 Days ary Discharge Instructions: Apply silver alginate to wound bed as instructed Secondary Dressing: Woven Gauze Sponges 2x2 in (DME) (Generic) 3 x Per Week/15 Days Discharge Instructions: Apply over primary dressing as directed. Secondary Dressing: ABD Pad, 5x9 (DME) (Generic) 3 x Per Week/15 Days Discharge Instructions: Apply over primary dressing as directed. Secured With: 54M Medipore H Soft Cloth Surgical T ape, 4 x 10 (in/yd) (DME) (Generic) 3 x Per Week/15 Days Discharge Instructions: Secure with tape as directed. 1. Silver alginate to the right upper leg wounds 2. Continue wound VAC to the left lateral leg wound Electronic Signature(s) Signed: 08/31/2021 3:38:13 PM By: Kalman Shan DO Entered By: Kalman Shan on 08/31/2021 15:36:54 -------------------------------------------------------------------------------- HxROS Details Patient Name: Date of Service: [name], [name] 08/31/2021 2:30 PM Medical Record Number: [id number] Patient Account Number: [id number] Date of Birth/Sex: Treating RN: [birth year] (70 y.o. [name] Primary Care Provider: [name] Other Clinician: Referring Provider: Treating Provider/Extender: Alysia Penna  in Treatment: 17 Information Obtained From Patient Eyes Medical History: Positive for: Cataracts - lens implant done; Glaucoma Negative for: Optic Neuritis Ear/Nose/Mouth/Throat Medical History: Positive for: Chronic sinus problems/congestion - seasonal Negative for: Middle ear problems Hematologic/Lymphatic Medical History: Negative for: Anemia; Hemophilia; Human Immunodeficiency Virus; Lymphedema; Sickle Cell Disease Respiratory Medical History: Positive for: Asthma Negative for: Aspiration; Chronic Obstructive Pulmonary Disease (COPD); Pneumothorax; Sleep Apnea; Tuberculosis Cardiovascular Medical History: Positive for: Hypertension - not on meds presently Negative for: Angina; Arrhythmia; Congestive Heart Failure; Coronary Artery Disease; Deep Vein Thrombosis; Hypotension; Myocardial Infarction; Peripheral Arterial Disease; Peripheral Venous Disease; Phlebitis; Vasculitis Past Medical History Notes: Hyperlipidemia Gastrointestinal Medical History: Negative for: Cirrhosis ; Colitis; Crohns; Hepatitis A; Hepatitis B; Hepatitis C Past Medical History Notes: GERD, Diverticulitis Endocrine Medical History: Positive for: Type II Diabetes Negative for: Type I Diabetes Time with diabetes: 12 years Treated with: Insulin Blood sugar tested every day: Yes T ested : bid Blood sugar testing results: Breakfast: 140 max; Bedtime: 220's max Genitourinary Medical History: Negative for: End Stage Renal Disease Past Medical History Notes: Overactive Bladder Immunological Medical History: Negative for: Lupus Erythematosus; Raynauds; Scleroderma Integumentary (Skin) Medical History: Negative for: History of Burn Musculoskeletal Medical History: Positive for: Osteoarthritis Negative for: Gout; Rheumatoid Arthritis; Osteomyelitis Past Medical History Notes: Fibromyalgia, toe amputations x 3 Neurologic Medical History: Positive for: Neuropathy Negative for: Dementia;  Quadriplegia; Paraplegia; Seizure Disorder Oncologic Medical History: Negative for: Received Chemotherapy; Received Radiation Psychiatric Medical History: Negative for: Anorexia/bulimia; Confinement Anxiety Past Medical History Notes: Anxiety HBO Extended History Items Ear/Nose/Mouth/Throat: Eyes: Eyes: Chronic sinus Cataracts Glaucoma problems/congestion Immunizations Pneumococcal Vaccine: Received Pneumococcal Vaccination: Yes Received Pneumococcal Vaccination On or After 60th Birthday: No Immunization Notes: up to date on pneumonia and tetanus shots Implantable Devices None Family and Social History Cancer: Yes - Child; Diabetes: Yes - Father; Heart Disease: Yes - Mother,Father; Hereditary Spherocytosis: No; Hypertension: Yes - Mother,Father; Kidney Disease: No; Lung Disease: No; Seizures: No; Stroke: No; Thyroid Problems: No; Tuberculosis: No; Never smoker; Marital Status - Married; Alcohol Use: Never; Drug Use: No History; Caffeine Use: Daily - coffee , sodas; Financial Concerns: No; Food, Clothing or  Shelter Needs: No; Support System Lacking: No; Transportation Concerns: No Electronic Signature(s) Signed: 08/31/2021 3:38:13 PM By: Kalman Shan DO Signed: 08/31/2021 4:46:21 PM By: Lorrin Jackson Entered By: Kalman Shan on 08/31/2021 15:32:37 -------------------------------------------------------------------------------- SuperBill Details Patient Name: Date of Service: [name], [name] 08/31/2021 Medical Record Number: [id number] Patient Account Number: [id number] Date of Birth/Sex: Treating RN: Apr 05, 1951 (70 y.o. 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 Facility Procedures CPT4 Code: [id number] Description: 99214 - WOUND CARE VISIT-LEV 4 EST PT Modifier: Quantity: 1 Physician Procedures : CPT4 Code Description Modifier [id number] - WC PHYS LEVEL 3 - EST PT ICD-10 Diagnosis Description 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ity L97.819 Non-pressure chronic ulcer of other part of right  lower leg with unspecified severity E11.9 Type 2 diabetes mellitus without complications H68.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

## 2021-09-01 NOTE — Progress Notes (Signed)
Yesenia Payne, Yesenia Payne ([id number]) Visit Report for 08/31/2021 Arrival Information Details Patient Name: Date of Service: [place] Yesenia Payne, [name] 08/31/2021 2:30 PM Medical Record Number: [id number] Patient Account Number: [id number] Date of Birth/Sex: Treating RN: 12-27-1950 (70 y.o. [name] Primary Care Yesenia Payne: [name] Other Clinician: Referring Yesenia Payne: Treating Zakee Deerman/Extender: Alysia Penna in Treatment: 69 Visit Information History Since Last Visit Added or deleted any medications: No Patient Arrived: Stretcher Any new allergies or adverse reactions: No Arrival Time: 14:24 Had a fall or experienced change in No Accompanied By: daughter activities of daily living that may affect Transfer Assistance: Stretcher risk of falls: Patient Identification Verified: Yes Signs or symptoms of abuse/neglect since last visito No Secondary Verification Process Completed: Yes Hospitalized since last visit: No Patient Requires Transmission-Based Precautions: No Implantable device outside of the clinic excluding No Patient Has Alerts: No cellular tissue based products placed in the center since last visit: Has Dressing in Place as Prescribed: Yes Pain Present Now: No Electronic Signature(s) Signed: 08/31/2021 4:46:21 PM By: Lorrin Jackson Entered By: Lorrin Jackson on 08/31/2021 14:25:51 -------------------------------------------------------------------------------- Clinic Level of Care Assessment Details Patient Name: Date of Service: [place] Yesenia Payne, [name] 08/31/2021 2:30 PM Medical Record Number: [id number] Patient Account Number: [id number] Date of Birth/Sex: Treating RN: 30-Apr-1951 (70 y.o. [name] Primary Care Lakashia Collison: [name] Other Clinician: Referring Ruhama Lehew: Treating Corrissa Martello/Extender: Alysia Penna in Treatment: 17 Clinic Level of Care Assessment Items TOOL 4 Quantity Score X- 1 0 Use  when only an EandM is performed on FOLLOW-UP visit ASSESSMENTS - Nursing Assessment / Reassessment X- 1 10 Reassessment of Co-morbidities (includes updates in patient status) X- 1 5 Reassessment of Adherence to Treatment Plan ASSESSMENTS - Wound and Skin A ssessment / Reassessment _0  - 0 Simple Wound Assessment / Reassessment - one wound X- 3 5 Complex Wound Assessment / Reassessment - multiple wounds _1  - 0 Dermatologic / Skin Assessment (not related to wound area) ASSESSMENTS - Focused Assessment _2  - 0 Circumferential Edema Measurements - multi extremities _3  - 0 Nutritional Assessment / Counseling / Intervention _4  - 0 Lower Extremity Assessment (monofilament, tuning fork, pulses) _5  - 0 Peripheral Arterial Disease Assessment (using hand held doppler) ASSESSMENTS - Ostomy and/or Continence Assessment and Care _6  - 0 Incontinence Assessment and Management _7  - 0 Ostomy Care Assessment and Management (repouching, etc.) PROCESS - Coordination of Care _8  - 0 Simple Patient / Family Education for ongoing care X- 1 20 Complex (extensive) Patient / Family Education for ongoing care X- 1 10 Staff obtains Programmer, systems, Records, T Results / Process Orders est _9  - 0 Staff telephones HHA, Nursing Homes / Clarify orders / etc _10  - 0 Routine Transfer to another Facility (non-emergent condition) _11  - 0 Routine Hospital Admission (non-emergent condition) _12  - 0 New Admissions / Biomedical engineer / Ordering NPWT Apligraf, etc. , _13  - 0 Emergency Hospital Admission (emergent condition) _14  - 0 Simple Discharge Coordination _15  - 0 Complex (extensive) Discharge Coordination PROCESS - Special Needs _16  - 0 Pediatric / Minor Patient Management _17  - 0 Isolation Patient Management _18  - 0 Hearing / Language / Visual special needs _19  - 0 Assessment of Community assistance (transportation, D/C planning, etc.) _20  - 0 Additional assistance / Altered mentation _21  - 0 Support  Surface(s) Assessment (bed, cushion, seat, etc.) INTERVENTIONS - Wound Cleansing / Measurement _22  - 0 Simple Wound Cleansing - one wound X- 3 5 Complex Wound Cleansing - multiple  wounds X- 1 5 Wound Imaging (photographs - any number of wounds) _0  - 0 Wound Tracing (instead of photographs) _1  - 0 Simple Wound Measurement - one wound X- 3 5 Complex Wound Measurement - multiple wounds INTERVENTIONS - Wound Dressings X - Small Wound Dressing one or multiple wounds 2 10 _2  - 0 Medium Wound Dressing one or multiple wounds X- 1 20 Large Wound Dressing one or multiple wounds <FUXNATFTDDUKGURK>_2<\/HCWCBJSEGBTDVVOH>_6  - 0 Application of Medications - topical <WVPXTGGYIRSWNIOE>_7<\/OJJKKXFGHWEXHBZJ>_6  - 0 Application of Medications - injection INTERVENTIONS - Miscellaneous _5  - 0 External ear exam _6  - 0 Specimen Collection (cultures, biopsies, blood, body fluids, etc.) _7  - 0 Specimen(s) / Culture(s) sent or taken to Lab for analysis _8  - 0 Patient Transfer (multiple staff / Civil Service fast streamer / Similar devices) _9  - 0 Simple Staple / Suture removal (25 or less) _10  - 0 Complex Staple / Suture removal (26 or more) _11  - 0 Hypo / Hyperglycemic Management (close monitor of Blood Glucose) _12  - 0 Ankle / Brachial Index (ABI) - do not check if billed separately X- 1 5 Vital Signs Has the patient been seen at the hospital within the last three years: Yes Total Score: 140 Level Of Care: New/Established - Level 4 Electronic Signature(s) Signed: 08/31/2021 4:46:21 PM By: Lorrin Jackson Entered By: Lorrin Jackson on 08/31/2021 15:19:49 -------------------------------------------------------------------------------- Encounter Discharge Information Details Patient Name: Date of Service: [place] Yesenia San, [name] 08/31/2021 2:30 PM Medical Record Number: [id number] Patient Account Number: [id number] Date of Birth/Sex: Treating RN: 06-15-1951 (70 y.o. [name] Primary Care Lilliemae Fruge: [name] Other Clinician: Referring Novaleigh Kohlman: Treating Bryna Razavi/Extender:  Alysia Penna in Treatment: 17 Encounter Discharge Information Items Discharge Condition: Stable Ambulatory Status: Stretcher Discharge Destination: Home Transportation: Ambulance Accompanied By: daughter Schedule Follow-up Appointment: Yes Clinical Summary of Care: Provided on 08/31/2021 Form Type Recipient Paper Patient Patient Electronic Signature(s) Signed: 08/31/2021 4:46:21 PM By: Lorrin Jackson Entered By: Lorrin Jackson on 08/31/2021 15:43:14 -------------------------------------------------------------------------------- Lower Extremity Assessment Details Patient Name: Date of Service: [place] Yesenia San, [name] 08/31/2021 2:30 PM Medical Record Number: [id number] Patient Account Number: [id number] Date of Birth/Sex: Treating RN: Oct 25, 1951 (70 y.o. [name] Primary Care Daine Gunther: [name] Other Clinician: Referring Wael Maestas: Treating Dorrie Cocuzza/Extender: Alysia Penna in Treatment: 17 Electronic Signature(s) Signed: 08/31/2021 4:46:21 PM By: Lorrin Jackson Entered By: Lorrin Jackson on 08/31/2021 14:27:18 -------------------------------------------------------------------------------- Multi Wound Chart Details Patient Name: Date of Service: [place] Yesenia San, [name] 08/31/2021 2:30 PM Medical Record Number: [id number] Patient Account Number: [id number] Date of Birth/Sex: Treating RN: 06/21/1951 (70 y.o. [name] Primary Care Purnell Daigle: [name] Other Clinician: Referring Audrina Marten: Treating Edinson Domeier/Extender: Alysia Penna in Treatment: 17 Vital Signs Height(in): 63 Capillary Blood Glucose(mg/dl): 131 Weight(lbs): 279 Pulse(bpm): 70 Body Mass Index(BMI): 42 Blood Pressure(mmHg): 124/82 Temperature(F): 98.6 Respiratory Rate(breaths/min): 18 Photos: Right Upper Leg Left, Lateral Upper Leg Right, Proximal Upper Leg Wound Location: Gradually Appeared Gradually  Appeared Other Lesion Wounding Event: Diabetic Wound/Ulcer of the Lower Diabetic Wound/Ulcer of the Lower Lesion Primary Etiology: Extremity Extremity Cataracts, Glaucoma, Chronic sinus Cataracts, Glaucoma, Chronic sinus Cataracts, Glaucoma, Chronic sinus Comorbid History: problems/congestion, Asthma, problems/congestion, Asthma, problems/congestion, Asthma, Hypertension, Type II Diabetes, Hypertension, Type II Diabetes, Hypertension, Type II Diabetes, Osteoarthritis, Neuropathy Osteoarthritis, Neuropathy Osteoarthritis, Neuropathy 11/06/2020 01/06/2021 08/19/2021 Date Acquired: 17 17 0 Weeks of Treatment: Open Open Open Wound Status: 1.5x2x1.5 11x6.5x8.5 0.9x2.1x0.2 Measurements L x W x D (cm) 2.356 56.156 1.484  A (cm) : rea 3.534 477.326 0.297 Volume (cm) : 91.70% 50.30% 0.00% % Reduction in Area: 87.50% -68.80% 0.00% % Reduction in Volume: Grade 2 Grade 2 Full Thickness Without Exposed Classification: Support Structures Medium Large Medium Exudate Amount: Serosanguineous Purulent Serosanguineous Exudate Type: red, brown yellow, brown, green red, brown Exudate Color: Distinct, outline attached Distinct, outline attached Distinct, outline attached Wound Margin: Large (67-100%) Large (67-100%) Large (67-100%) Granulation Amount: Red, Friable Red, Friable Pink, Pale Granulation Quality: None Present (0%) Small (1-33%) Small (1-33%) Necrotic Amount: Fat Layer (Subcutaneous Tissue): Yes Fat Layer (Subcutaneous Tissue): Yes Fat Layer (Subcutaneous Tissue): Yes Exposed Structures: Fascia: No Muscle: Yes Fascia: No Tendon: No Fascia: No Tendon: No Muscle: No Tendon: No Muscle: No Joint: No Joint: No Joint: No Bone: No Bone: No Bone: No Medium (34-66%) Small (1-33%) None Epithelialization: Treatment Notes Electronic Signature(s) Signed: 08/31/2021 3:38:13 PM By: Kalman Shan DO Signed: 08/31/2021 4:46:21 PM By: Lorrin Jackson Entered By: Kalman Shan on 08/31/2021 15:31:29 -------------------------------------------------------------------------------- Multi-Disciplinary Care Plan Details Patient Name: Date of Service: [place] Yesenia San, [name] 08/31/2021 2:30 PM Medical Record Number: [id number] Patient Account Number: [id number] Date of Birth/Sex: Treating RN: 1951/06/28 (70 y.o. [name] Primary Care Genny Caulder: [name] Other Clinician: Referring Machael Raine: Treating Armya Westerhoff/Extender: Alysia Penna in Treatment: 17 Active Inactive Wound/Skin Impairment Nursing Diagnoses: Impaired tissue integrity Knowledge deficit related to ulceration/compromised skin integrity Goals: Patient will demonstrate a reduced rate of smoking or cessation of smoking Date Initiated: 04/30/2021 Target Resolution Date: 09/28/2021 Goal Status: Active Patient/caregiver will verbalize understanding of skin care regimen Date Initiated: 04/30/2021 Date Inactivated: 07/09/2021 Target Resolution Date: 07/10/2021 Goal Status: Met Ulcer/skin breakdown will have a volume reduction of 50% by week 8 Date Initiated: 07/09/2021 Date Inactivated: 08/31/2021 Target Resolution Date: 08/31/2021 Goal Status: Met Ulcer/skin breakdown will have a volume reduction of 80% by week 12 Date Initiated: 08/31/2021 Target Resolution Date: 09/28/2021 Goal Status: Active Interventions: Assess patient/caregiver ability to obtain necessary supplies Assess patient/caregiver ability to perform ulcer/skin care regimen upon admission and as needed Assess ulceration(s) every visit Notes: Electronic Signature(s) Signed: 08/31/2021 4:46:21 PM By: Lorrin Jackson Entered By: Lorrin Jackson on 08/31/2021 14:34:33 -------------------------------------------------------------------------------- Pain Assessment Details Patient Name: Date of Service: [place] Yesenia San, [name] 08/31/2021 2:30 PM Medical Record Number: [id number] Patient Account  Number: [id number] Date of Birth/Sex: Treating RN: December 11, 1950 (70 y.o. [name] Primary Care Marzetta Lanza: [name] Other Clinician: Referring Ladon Vandenberghe: Treating Jemari Hallum/Extender: Alysia Penna in Treatment: 17 Active Problems Location of Pain Severity and Description of Pain Patient Has Paino Yes Site Locations Pain Location: Pain Location: Pain in Ulcers With Dressing Change: Yes Duration of the Pain. Constant / Intermittento Intermittent Rate the pain. Current Pain Level: 5 Character of Pain Describe the Pain: Aching, Throbbing Pain Management and Medication Current Pain Management: Medication: Yes Cold Application: No Rest: Yes Massage: No Activity: No T.E.N.S.: No Heat Application: No Leg drop or elevation: No Is the Current Pain Management Adequate: Adequate How does your wound impact your activities of daily livingo Sleep: No Bathing: No Appetite: No Relationship With Others: No Bladder Continence: No Emotions: No Bowel Continence: No Work: No Toileting: No Drive: No Dressing: No Hobbies: No Electronic Signature(s) Signed: 08/31/2021 4:46:21 PM By: Lorrin Jackson Entered By: Lorrin Jackson on 08/31/2021 14:27:10 -------------------------------------------------------------------------------- Patient/Caregiver Education Details Patient Name: Date of Service: [place] Yesenia San, [place] RET D. 10/24/2022andnbsp2:30 PM Medical Record Number: [id number] Patient Account Number: [id number]  Date of Birth/Gender: Treating RN: 26-Oct-1951 (70 y.o. [name] Primary Care Physician: [name] Other Clinician: Referring Physician: Treating Physician/Extender: Alysia Penna in Treatment: 17 Education Assessment Education Provided To: Patient and Caregiver Education Topics Provided Pressure: Methods: Explain/Verbal, Printed Responses: State content correctly Wound/Skin Impairment: Methods:  Demonstration, Explain/Verbal, Printed Responses: State content correctly Electronic Signature(s) Signed: 08/31/2021 4:46:21 PM By: Lorrin Jackson Entered By: Lorrin Jackson on 08/31/2021 14:35:11 -------------------------------------------------------------------------------- Wound Assessment Details Patient Name: Date of Service: [place] Yesenia San, [name] 08/31/2021 2:30 PM Medical Record Number: [id number] Patient Account Number: [id number] Date of Birth/Sex: Treating RN: 1951-02-14 (70 y.o. [name] Primary Care Jann Ra: [name] Other Clinician: Referring Adiel Mcnamara: Treating Christien Berthelot/Extender: Alysia Penna in Treatment: 17 Wound Status Wound Number: 6 Primary Diabetic Wound/Ulcer of the Lower Extremity Etiology: Wound Location: Right Upper Leg Wound Open Wounding Event: Gradually Appeared Status: Date Acquired: 11/06/2020 Comorbid Cataracts, Glaucoma, Chronic sinus problems/congestion, Asthma, Weeks Of Treatment: 17 History: Hypertension, Type II Diabetes, Osteoarthritis, Neuropathy Clustered Wound: No Photos Wound Measurements Length: (cm) 1.5 Width: (cm) 2 Depth: (cm) 1.5 Area: (cm) 2.356 Volume: (cm) 3.534 % Reduction in Area: 91.7% % Reduction in Volume: 87.5% Epithelialization: Medium (34-66%) Tunneling: No Undermining: No Wound Description Classification: Grade 2 Wound Margin: Distinct, outline attached Exudate Amount: Medium Exudate Type: Serosanguineous Exudate Color: red, brown Foul Odor After Cleansing: No Slough/Fibrino No Wound Bed Granulation Amount: Large (67-100%) Exposed Structure Granulation Quality: Red, Friable Fascia Exposed: No Necrotic Amount: None Present (0%) Fat Layer (Subcutaneous Tissue) Exposed: Yes Tendon Exposed: No Muscle Exposed: No Joint Exposed: No Bone Exposed: No Treatment Notes Wound #6 (Upper Leg) Wound Laterality: Right Cleanser Wound Cleanser Discharge Instruction:  Cleanse the wound with wound cleanser prior to applying a clean dressing using gauze sponges, not tissue or cotton balls. Peri-Wound Care Ketoconazole Cream 2% Discharge Instruction: In office only*** patient to apply over the counter antifungal cream.***Apply Ketoconazole as directed Topical Dakins Solution Discharge Instruction: Moisten gauze with Dakins and pack into wound Primary Dressing Secondary Dressing Woven Gauze Sponge, Non-Sterile 4x4 in Discharge Instruction: Apply over primary dressing as directed. ABD Pad, 5x9 Discharge Instruction: Apply over primary dressing as directed. Secured With 27M Medipore H Soft Cloth Surgical T 4 x 2 (in/yd) ape Discharge Instruction: Secure dressing with tape as directed. Compression Wrap Compression Stockings Add-Ons Electronic Signature(s) Signed: 08/31/2021 4:46:21 PM By: Lorrin Jackson Entered By: Lorrin Jackson on 08/31/2021 14:32:37 -------------------------------------------------------------------------------- Wound Assessment Details Patient Name: Date of Service: [place] Yesenia San, [name] 08/31/2021 2:30 PM Medical Record Number: [id number] Patient Account Number: [id number] Date of Birth/Sex: Treating RN: 1951/05/13 (70 y.o. [name] Primary Care Duffy Dantonio: [name] Other Clinician: Referring Janiyla Long: Treating Raiford Fetterman/Extender: Alysia Penna in Treatment: 17 Wound Status Wound Number: 8 Primary Diabetic Wound/Ulcer of the Lower Extremity Etiology: Wound Location: Left, Lateral Upper Leg Wound Open Wounding Event: Gradually Appeared Status: Date Acquired: 01/06/2021 Comorbid Cataracts, Glaucoma, Chronic sinus problems/congestion, Asthma, Weeks Of Treatment: 17 History: Hypertension, Type II Diabetes, Osteoarthritis, Neuropathy Clustered Wound: No Photos Wound Measurements Length: (cm) 11 Width: (cm) 6.5 Depth: (cm) 8.5 Area: (cm) 56.156 Volume: (cm) 477.326 % Reduction in  Area: 50.3% % Reduction in Volume: -68.8% Epithelialization: Small (1-33%) Tunneling: No Undermining: No Wound Description Classification: Grade 2 Wound Margin: Distinct, outline attached Exudate Amount: Large Exudate Type: Purulent Exudate Color: yellow, brown, green Foul Odor After Cleansing: No Slough/Fibrino Yes Wound Bed Granulation Amount: Large (67-100%)  Exposed Structure Granulation Quality: Red, Friable Fascia Exposed: No Necrotic Amount: Small (1-33%) Fat Layer (Subcutaneous Tissue) Exposed: Yes Necrotic Quality: Adherent Slough Tendon Exposed: No Muscle Exposed: Yes Necrosis of Muscle: No Joint Exposed: No Bone Exposed: No Treatment Notes Wound #8 (Upper Leg) Wound Laterality: Left, Lateral Cleanser Soap and Water Discharge Instruction: May shower and wash wound with dial antibacterial soap and water prior to dressing change. Wound Cleanser Discharge Instruction: Cleanse the wound with wound cleanser prior to applying a clean dressing using gauze sponges, not tissue or cotton balls. Peri-Wound Care Topical Primary Dressing KerraCel Ag Gelling Fiber Dressing, 2x2 in (silver alginate) Discharge Instruction: Apply silver alginate to wound bed as instructed Secondary Dressing Woven Gauze Sponges 2x2 in Discharge Instruction: Apply over primary dressing as directed. ABD Pad, 5x9 Discharge Instruction: Apply over primary dressing as directed. Secured With 40M Medipore H Soft Cloth Surgical T ape, 4 x 10 (in/yd) Discharge Instruction: Secure with tape as directed. Compression Wrap Compression Stockings Add-Ons Electronic Signature(s) Signed: 08/31/2021 4:46:21 PM By: Lorrin Jackson Entered By: Lorrin Jackson on 08/31/2021 14:43:14 -------------------------------------------------------------------------------- Wound Assessment Details Patient Name: Date of Service: [place] Yesenia San, [name] 08/31/2021 2:30 PM Medical Record Number: [id number] Patient Account  Number: [id number] Date of Birth/Sex: Treating RN: October 26, 1951 (70 y.o. [name] Primary Care Thekla Colborn: [name] Other Clinician: Referring Krue Peterka: Treating Kellyann Ordway/Extender: Alysia Penna in Treatment: 17 Wound Status Wound Number: 9 Primary Lesion Etiology: Wound Location: Right, Proximal Upper Leg Wound Open Wounding Event: Other Lesion Status: Date Acquired: 08/19/2021 Comorbid Cataracts, Glaucoma, Chronic sinus problems/congestion, Asthma, Weeks Of Treatment: 0 History: Hypertension, Type II Diabetes, Osteoarthritis, Neuropathy Clustered Wound: No Photos Wound Measurements Length: (cm) 0.9 Width: (cm) 2.1 Depth: (cm) 0.2 Area: (cm) 1.484 Volume: (cm) 0.297 % Reduction in Area: 0% % Reduction in Volume: 0% Epithelialization: None Tunneling: No Undermining: No Wound Description Classification: Full Thickness Without Exposed Support Structures Wound Margin: Distinct, outline attached Exudate Amount: Medium Exudate Type: Serosanguineous Exudate Color: red, brown Foul Odor After Cleansing: No Slough/Fibrino Yes Wound Bed Granulation Amount: Large (67-100%) Exposed Structure Granulation Quality: Pink, Pale Fascia Exposed: No Necrotic Amount: Small (1-33%) Fat Layer (Subcutaneous Tissue) Exposed: Yes Necrotic Quality: Adherent Slough Tendon Exposed: No Muscle Exposed: No Joint Exposed: No Bone Exposed: No Treatment Notes Wound #9 (Upper Leg) Wound Laterality: Right, Proximal Cleanser Soap and Water Discharge Instruction: May shower and wash wound with dial antibacterial soap and water prior to dressing change. Wound Cleanser Discharge Instruction: Cleanse the wound with wound cleanser prior to applying a clean dressing using gauze sponges, not tissue or cotton balls. Peri-Wound Care Topical Primary Dressing KerraCel Ag Gelling Fiber Dressing, 2x2 in (silver alginate) Discharge Instruction: Apply silver alginate to  wound bed as instructed Secondary Dressing Woven Gauze Sponges 2x2 in Discharge Instruction: Apply over primary dressing as directed. ABD Pad, 5x9 Discharge Instruction: Apply over primary dressing as directed. Secured With 40M Medipore H Soft Cloth Surgical T ape, 4 x 10 (in/yd) Discharge Instruction: Secure with tape as directed. Compression Wrap Compression Stockings Add-Ons Electronic Signature(s) Signed: 08/31/2021 4:46:21 PM By: Lorrin Jackson Signed: 09/01/2021 2:37:05 PM By: Sandre Kitty Entered By: Sandre Kitty on 08/31/2021 14:40:41 -------------------------------------------------------------------------------- Vitals Details Patient Name: Date of Service: [place] Yesenia San, [name] 08/31/2021 2:30 PM Medical Record Number: [id number] Patient Account Number: [id number] Date of Birth/Sex: Treating RN: December 03, 1950 (70 y.o. [name] Primary Care Aleaha Fickling: [name] Other Clinician: Referring Keigen Caddell: Treating Valeria Krisko/Extender: Kalman Shan  Rocco Serene in Treatment: 17 Vital Signs Time Taken: 14:25 Temperature (F): 98.6 Height (in): 63 Pulse (bpm): 81 Weight (lbs): 279 Respiratory Rate (breaths/min): 18 Body Mass Index (BMI): 49.4 Blood Pressure (mmHg): 124/82 Capillary Blood Glucose (mg/dl): 131 Reference Range: 80 - 120 mg / dl Electronic Signature(s) Signed: 08/31/2021 4:46:21 PM By: Lorrin Jackson Entered By: Lorrin Jackson on 08/31/2021 14:26:27

## 2021-09-03 DIAGNOSIS — E119 Type 2 diabetes mellitus without complications: Secondary | ICD-10-CM | POA: Diagnosis not present

## 2021-09-03 DIAGNOSIS — S71102A Unspecified open wound, left thigh, initial encounter: Secondary | ICD-10-CM | POA: Diagnosis not present

## 2021-09-06 DIAGNOSIS — L8990 Pressure ulcer of unspecified site, unspecified stage: Secondary | ICD-10-CM | POA: Diagnosis not present

## 2021-09-06 DIAGNOSIS — L02612 Cutaneous abscess of left foot: Secondary | ICD-10-CM | POA: Diagnosis not present

## 2021-09-08 DIAGNOSIS — L97829 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ity: Secondary | ICD-10-CM | POA: Diagnosis not present

## 2021-09-08 DIAGNOSIS — E1169 Type 2 diabetes mellitus with other specified complication: Secondary | ICD-10-CM | POA: Diagnosis not present

## 2021-09-11 DIAGNOSIS — L02612 Cutaneous abscess of left foot: Secondary | ICD-10-CM | POA: Diagnosis not present

## 2021-09-11 DIAGNOSIS — L8990 Pressure ulcer of unspecified site, unspecified stage: Secondary | ICD-10-CM | POA: Diagnosis not present

## 2021-09-14 ENCOUNTER — Encounter (HOSPITAL_BASED_OUTPATIENT_CLINIC_OR_DEPARTMENT_OTHER): Payer: Medicare Other | Attending: Internal Medicine | Admitting: Internal Medicine

## 2021-09-14 ENCOUNTER — Other Ambulatory Visit: Payer: Self-pay

## 2021-09-14 DIAGNOSIS — L97829 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ity: Secondary | ICD-10-CM | POA: Diagnosis not present

## 2021-09-14 DIAGNOSIS — Z743 Need for continuous supervision: Secondary | ICD-10-CM | POA: Diagnosis not present

## 2021-09-14 DIAGNOSIS — S31109A Unspecified open wound of abdominal wall, unspecified quadrant without penetration into peritoneal cavity, initial encounter: Secondary | ICD-10-CM | POA: Insufficient documentation

## 2021-09-14 DIAGNOSIS — Z794 Long term (current) use of insulin: Secondary | ICD-10-CM | POA: Insufficient documentation

## 2021-09-14 DIAGNOSIS — X58XXXA Exposure to other specified factors, initial encounter: Secondary | ICD-10-CM | POA: Diagnosis not present

## 2021-09-14 DIAGNOSIS — I10 Essential (primary) hypertension: Secondary | ICD-10-CM | POA: Diagnosis not present

## 2021-09-14 DIAGNOSIS — E119 Type 2 diabetes mellitus without complications: Secondary | ICD-10-CM

## 2021-09-14 DIAGNOSIS — E1142 Type 2 diabetes mellitus with diabetic polyneuropathy: Secondary | ICD-10-CM | POA: Insufficient documentation

## 2021-09-14 DIAGNOSIS — E11622 Type 2 diabetes mellitus with other skin ulcer: Secondary | ICD-10-CM | POA: Insufficient documentation

## 2021-09-14 DIAGNOSIS — L97819 Non-pressure chronic ulcer of other part of right lower leg with unspecified severity: Secondary | ICD-10-CM | POA: Diagnosis not present

## 2021-09-14 DIAGNOSIS — Z89512 Acquired absence of left leg below knee: Secondary | ICD-10-CM | POA: Diagnosis not present

## 2021-09-14 NOTE — Progress Notes (Signed)
CELISA, SCHOENBERG ([id number]) Visit Report for 09/14/2021 Arrival Information Details Patient Name: Date of Service: [name], [name] 09/14/2021 10:45 A M Medical Record Number: [id number] Patient Account Number: [id number] Date of Birth/Sex: Treating RN: [birth year] (70 y.o. [name], Tammi Klippel Primary Care Chou Busler: [name] Other Clinician: Referring Larance Ratledge: Treating Kobe Ofallon/Extender: Alysia Penna in Treatment: 65 Visit Information History Since Last Visit Added or deleted any medications: No Patient Arrived: Stretcher Any new allergies or adverse reactions: No Arrival Time: 10:15 Had a fall or experienced change in No Accompanied By: daughter and EMS activities of daily living that may affect Transfer Assistance: Stretcher risk of falls: Patient Identification Verified: Yes Signs or symptoms of abuse/neglect since last visito No Secondary Verification Process Completed: Yes Hospitalized since last visit: No Patient Requires Transmission-Based Precautions: No Implantable device outside of the clinic excluding No Patient Has Alerts: No cellular tissue based products placed in the center since last visit: Has Dressing in Place as Prescribed: Yes Pain Present Now: Yes Electronic Signature(s) Signed: 09/14/2021 5:27:57 PM By: Deon Pilling RN, BSN Entered By: Deon Pilling on 09/14/2021 10:38:01 -------------------------------------------------------------------------------- Clinic Level of Care Assessment Details Patient Name: Date of Service: MO 38, [place] RET D. 09/14/2021 10:45 A M Medical Record Number: [id number] Patient Account Number: [id number] Date of Birth/Sex: Treating RN: Mar 14, 1951 (70 y.o. [name], Meta.Reding Primary Care Fallon Haecker: [name] Other Clinician: Referring Steffie Waggoner: Treating Carold Eisner/Extender: Alysia Penna in Treatment: 19 Clinic Level of Care Assessment Items TOOL 4 Quantity  Score X- 1 0 Use when only an EandM is performed on FOLLOW-UP visit ASSESSMENTS - Nursing Assessment / Reassessment X- 1 10 Reassessment of Co-morbidities (includes updates in patient status) X- 1 5 Reassessment of Adherence to Treatment Plan ASSESSMENTS - Wound and Skin A ssessment / Reassessment []  - 0 Simple Wound Assessment / Reassessment - one wound X- 5 5 Complex Wound Assessment / Reassessment - multiple wounds X- 1 10 Dermatologic / Skin Assessment (not related to wound area) ASSESSMENTS - Focused Assessment []  - 0 Circumferential Edema Measurements - multi extremities X- 1 10 Nutritional Assessment / Counseling / Intervention []  - 0 Lower Extremity Assessment (monofilament, tuning fork, pulses) []  - 0 Peripheral Arterial Disease Assessment (using hand held doppler) ASSESSMENTS - Ostomy and/or Continence Assessment and Care []  - 0 Incontinence Assessment and Management []  - 0 Ostomy Care Assessment and Management (repouching, etc.) PROCESS - Coordination of Care []  - 0 Simple Patient / Family Education for ongoing care X- 1 20 Complex (extensive) Patient / Family Education for ongoing care X- 1 10 Staff obtains Programmer, systems, Records, T Results / Process Orders est []  - 0 Staff telephones HHA, Nursing Homes / Clarify orders / etc []  - 0 Routine Transfer to another Facility (non-emergent condition) []  - 0 Routine Hospital Admission (non-emergent condition) []  - 0 New Admissions / Biomedical engineer / Ordering NPWT Apligraf, etc. , []  - 0 Emergency Hospital Admission (emergent condition) []  - 0 Simple Discharge Coordination X- 1 15 Complex (extensive) Discharge Coordination PROCESS - Special Needs []  - 0 Pediatric / Minor Patient Management []  - 0 Isolation Patient Management []  - 0 Hearing / Language / Visual special needs []  - 0 Assessment of Community assistance (transportation, D/C planning, etc.) []  - 0 Additional assistance / Altered  mentation []  - 0 Support Surface(s) Assessment (bed, cushion, seat, etc.) INTERVENTIONS - Wound Cleansing / Measurement []  - 0 Simple Wound Cleansing - one wound X- 5  5 Complex Wound Cleansing - multiple wounds X- 1 5 Wound Imaging (photographs - any number of wounds) []  - 0 Wound Tracing (instead of photographs) []  - 0 Simple Wound Measurement - one wound X- 5 5 Complex Wound Measurement - multiple wounds INTERVENTIONS - Wound Dressings X - Small Wound Dressing one or multiple wounds 4 10 X- 5 15 Medium Wound Dressing one or multiple wounds []  - 0 Large Wound Dressing one or multiple wounds []  - 0 Application of Medications - topical []  - 0 Application of Medications - injection INTERVENTIONS - Miscellaneous []  - 0 External ear exam []  - 0 Specimen Collection (cultures, biopsies, blood, body fluids, etc.) []  - 0 Specimen(s) / Culture(s) sent or taken to Lab for analysis []  - 0 Patient Transfer (multiple staff / Civil Service fast streamer / Similar devices) []  - 0 Simple Staple / Suture removal (25 or less) []  - 0 Complex Staple / Suture removal (26 or more) []  - 0 Hypo / Hyperglycemic Management (close monitor of Blood Glucose) []  - 0 Ankle / Brachial Index (ABI) - do not check if billed separately X- 1 5 Vital Signs Has the patient been seen at the hospital within the last three years: Yes Total Score: 280 Level Of Care: New/Established - Level 5 Electronic Signature(s) Signed: 09/14/2021 5:27:57 PM By: Deon Pilling RN, BSN Entered By: Deon Pilling on 09/14/2021 11:10:58 -------------------------------------------------------------------------------- Encounter Discharge Information Details Patient Name: Date of Service: [name], [name] 09/14/2021 10:45 A M Medical Record Number: [id number] Patient Account Number: [id number] Date of Birth/Sex: Treating RN: [birth year] (70 y.o. [name], Tammi Klippel Primary Care Riva Sesma: [name] Other Clinician: Referring  Teanna Elem: Treating Amadi Frady/Extender: Alysia Penna in Treatment: 19 Encounter Discharge Information Items Discharge Condition: Stable Ambulatory Status: Stretcher Discharge Destination: Home Transportation: Ambulance Accompanied By: daughter and EMS Schedule Follow-up Appointment: Yes Clinical Summary of Care: Electronic Signature(s) Signed: 09/14/2021 5:27:57 PM By: Deon Pilling RN, BSN Entered By: Deon Pilling on 09/14/2021 11:11:45 -------------------------------------------------------------------------------- Lower Extremity Assessment Details Patient Name: Date of Service: [name], [name] 09/14/2021 10:45 A M Medical Record Number: [id number] Patient Account Number: [id number] Date of Birth/Sex: Treating RN: 02-24-1951 (70 y.o. [name] Primary Care Cady Hafen: [name] Other Clinician: Referring Terence Bart: Treating Reace Breshears/Extender: Alysia Penna in Treatment: 19 Electronic Signature(s) Signed: 09/14/2021 5:27:57 PM By: Deon Pilling RN, BSN Entered By: Deon Pilling on 09/14/2021 10:38:45 -------------------------------------------------------------------------------- Multi Wound Chart Details Patient Name: Date of Service: [name], [name] 09/14/2021 10:45 A M Medical Record Number: [id number] Patient Account Number: [id number] Date of Birth/Sex: Treating RN: 08/08/1951 (70 y.o. [name] Primary Care Zakyia Gagan: [name] Other Clinician: Referring Berlyn Saylor: Treating Tali Cleaves/Extender: Alysia Penna in Treatment: 19 Vital Signs Height(in): 63 Capillary Blood Glucose(mg/dl): 111 Weight(lbs): 279 Pulse(bpm): 54 Body Mass Index(BMI): 82 Blood Pressure(mmHg): 120/79 Temperature(F): 98.1 Respiratory Rate(breaths/min): 20 Photos: Right Abdomen - Lower Quadrant Left, Medial Upper Leg Right Upper Leg Wound Location: Gradually Appeared Gradually Appeared  Gradually Appeared Wounding Event: Pressure Ulcer Pressure Ulcer Diabetic Wound/Ulcer of the Lower Primary Etiology: Extremity Cataracts, Glaucoma, Chronic sinus Cataracts, Glaucoma, Chronic sinus Cataracts, Glaucoma, Chronic sinus Comorbid History: problems/congestion, Asthma, problems/congestion, Asthma, problems/congestion, Asthma, Hypertension, Type II Diabetes, Hypertension, Type II Diabetes, Hypertension, Type II Diabetes, Osteoarthritis, Neuropathy Osteoarthritis, Neuropathy Osteoarthritis, Neuropathy 09/14/2021 09/14/2021 11/06/2020 Date Acquired: 0 0 19 Weeks of Treatment: Open Open Open Wound Status: 0.8x0.7x0.2 0.3x0.5x0.2 1.5x1x0.9 Measurements L x W  x D (cm) 0.44 0.118 1.178 A (cm) : rea 0.088 0.024 1.06 Volume (cm) : 0.00% 0.00% 95.80% % Reduction in A rea: 0.00% 0.00% 96.30% % Reduction in Volume: No No No Tunneling: No No No Undermining: Category/Stage III Category/Stage II Grade 2 Classification: Medium Medium Medium Exudate A mount: Serosanguineous Purulent Purulent Exudate Type: red, brown yellow, brown, green yellow, brown, green Exudate Color: No No No Foul Odor A Cleansing: fter N/A N/A N/A Odor A nticipated Due to Product Use: Distinct, outline attached Distinct, outline attached Distinct, outline attached Wound Margin: Medium (34-66%) Large (67-100%) Large (67-100%) Granulation A mount: Red, Pink Red, Pink Red, Friable Granulation Quality: Medium (34-66%) None Present (0%) Small (1-33%) Necrotic A mount: Fat Layer (Subcutaneous Tissue): Yes Fat Layer (Subcutaneous Tissue): Yes Fat Layer (Subcutaneous Tissue): Yes Exposed Structures: Fascia: No Fascia: No Fascia: No Tendon: No Tendon: No Tendon: No Muscle: No Muscle: No Muscle: No Joint: No Joint: No Joint: No Bone: No Bone: No Bone: No Small (1-33%) None Small (1-33%) Epithelialization: Wound Number: 8 [id number] N/A Photos: N/A Left, Lateral Upper Leg Right, Proximal Upper Leg  N/A Wound Location: Gradually Appeared Other Lesion N/A Wounding Event: Diabetic Wound/Ulcer of the Lower Lesion N/A Primary Etiology: Extremity Cataracts, Glaucoma, Chronic sinus Cataracts, Glaucoma, Chronic sinus N/A Comorbid History: problems/congestion, Asthma, problems/congestion, Asthma, Hypertension, Type II Diabetes, Hypertension, Type II Diabetes, Osteoarthritis, Neuropathy Osteoarthritis, Neuropathy 01/06/2021 08/19/2021 N/A Date Acquired: 19 2 N/A Weeks of Treatment: Open Open N/A Wound Status: 9.5x6x5.4 1.1x2x0.2 N/A Measurements L x W x D (cm) 44.768 1.728 N/A A (cm) : rea 241.746 0.346 N/A Volume (cm) : 60.40% -16.40% N/A % Reduction in A rea: 14.50% -16.50% N/A % Reduction in Volume: 9 Position 1 (o'clock): 4.1 Maximum Distance 1 (cm): 12 Position 2 (o'clock): 3.2 Maximum Distance 2 (cm): 7 Starting Position 1 (o'clock): 9 Ending Position 1 (o'clock): 3.6 Maximum Distance 1 (cm): Yes No N/A Tunneling: Yes No N/A Undermining: Grade 2 Full Thickness Without Exposed N/A Classification: Support Structures Large Medium N/A Exudate Amount: Serosanguineous Serosanguineous N/A Exudate Type: red, brown red, brown N/A Exudate Color: Yes No N/A Foul Odor A Cleansing: fter Yes N/A N/A Odor Anticipated Due to Product Use: Distinct, outline attached Distinct, outline attached N/A Wound Margin: Large (67-100%) Large (67-100%) N/A Granulation A mount: Red, Friable Pink, Pale, Friable N/A Granulation Quality: Small (1-33%) Small (1-33%) N/A Necrotic Amount: Fat Layer (Subcutaneous Tissue): Yes Fat Layer (Subcutaneous Tissue): Yes N/A Exposed Structures: Muscle: Yes Fascia: No Fascia: No Tendon: No Tendon: No Muscle: No Joint: No Joint: No Bone: No Bone: No Small (1-33%) None N/A Epithelialization: Treatment Notes Wound #10 (Abdomen - Lower Quadrant) Wound Laterality: Right Cleanser Soap and Water Discharge Instruction: May shower  and wash wound with dial antibacterial soap and water prior to dressing change. Peri-Wound Care Zinc Oxide Ointment 30g tube Discharge Instruction: As needed Apply Zinc Oxide to periwound with each dressing change Topical Primary Dressing KerraCel Ag Gelling Fiber Dressing, 4x5 in (silver alginate) Discharge Instruction: In clinic and patient to continue to use until interdry arrives. Apply silver alginate to wound bed as instructed Secondary Dressing Woven Gauze Sponge, Non-Sterile 4x4 in Discharge Instruction: Apply over primary dressing as directed. ABD Pad, 5x9 Discharge Instruction: Apply over primary dressing as directed. Secured With 79M Medipore H Soft Cloth Surgical T 4 x 2 (in/yd) ape Discharge Instruction: Secure dressing with tape as directed. Compression Wrap Compression Stockings Add-Ons Wound #11 (Upper Leg) Wound Laterality: Left, Medial Cleanser Soap and Water  Discharge Instruction: May shower and wash wound with dial antibacterial soap and water prior to dressing change. Peri-Wound Care Zinc Oxide Ointment 30g tube Discharge Instruction: As needed Apply Zinc Oxide to periwound with each dressing change Topical Primary Dressing KerraCel Ag Gelling Fiber Dressing, 4x5 in (silver alginate) Discharge Instruction: In clinic and patient to continue to use until interdry arrives. Apply silver alginate to wound bed as instructed Secondary Dressing Woven Gauze Sponge, Non-Sterile 4x4 in Discharge Instruction: Apply over primary dressing as directed. ABD Pad, 5x9 Discharge Instruction: Apply over primary dressing as directed. Secured With 24M Medipore H Soft Cloth Surgical T 4 x 2 (in/yd) ape Discharge Instruction: Secure dressing with tape as directed. Compression Wrap Compression Stockings Add-Ons Wound #6 (Upper Leg) Wound Laterality: Right Cleanser Soap and Water Discharge Instruction: May shower and wash wound with dial antibacterial soap and water prior to  dressing change. Peri-Wound Care Zinc Oxide Ointment 30g tube Discharge Instruction: As needed Apply Zinc Oxide to periwound with each dressing change Topical Primary Dressing KerraCel Ag Gelling Fiber Dressing, 4x5 in (silver alginate) Discharge Instruction: In clinic and patient to continue to use until interdry arrives. Apply silver alginate to wound bed as instructed Secondary Dressing Woven Gauze Sponge, Non-Sterile 4x4 in Discharge Instruction: Apply over primary dressing as directed. ABD Pad, 5x9 Discharge Instruction: Apply over primary dressing as directed. Secured With 24M Medipore H Soft Cloth Surgical T 4 x 2 (in/yd) ape Discharge Instruction: Secure dressing with tape as directed. Compression Wrap Compression Stockings Add-Ons Wound #8 (Upper Leg) Wound Laterality: Left, Lateral Cleanser Soap and Water Discharge Instruction: May shower and wash wound with dial antibacterial soap and water prior to dressing change. Wound Cleanser Discharge Instruction: Cleanse the wound with wound cleanser prior to applying a clean dressing using gauze sponges, not tissue or cotton balls. Peri-Wound Care Topical Primary Dressing dakin's Discharge Instruction: wet to dry in clinic. Wound vac three times a week. Secondary Dressing Woven Gauze Sponges 2x2 in Discharge Instruction: Apply over primary dressing as directed. ABD Pad, 5x9 Discharge Instruction: Apply over primary dressing as directed. Secured With 24M Medipore H Soft Cloth Surgical T ape, 4 x 10 (in/yd) Discharge Instruction: Secure with tape as directed. Compression Wrap Compression Stockings Add-Ons Wound #9 (Upper Leg) Wound Laterality: Right, Proximal Cleanser Soap and Water Discharge Instruction: May shower and wash wound with dial antibacterial soap and water prior to dressing change. Peri-Wound Care Zinc Oxide Ointment 30g tube Discharge Instruction: As needed Apply Zinc Oxide to periwound with each dressing  change Topical Primary Dressing KerraCel Ag Gelling Fiber Dressing, 4x5 in (silver alginate) Discharge Instruction: In clinic and patient to continue to use until interdry arrives. Apply silver alginate to wound bed as instructed Secondary Dressing Woven Gauze Sponge, Non-Sterile 4x4 in Discharge Instruction: Apply over primary dressing as directed. ABD Pad, 5x9 Discharge Instruction: Apply over primary dressing as directed. Secured With 24M Medipore H Soft Cloth Surgical T 4 x 2 (in/yd) ape Discharge Instruction: Secure dressing with tape as directed. Compression Wrap Compression Stockings Add-Ons Electronic Signature(s) Signed: 09/14/2021 12:50:04 PM By: Kalman Shan DO Signed: 09/14/2021 5:20:47 PM By: Lorrin Jackson Entered By: Kalman Shan on 09/14/2021 12:38:45 -------------------------------------------------------------------------------- Multi-Disciplinary Care Plan Details Patient Name: Date of Service: [name], [name] 09/14/2021 10:45 A M Medical Record Number: [id number] Patient Account Number: [id number] Date of Birth/Sex: Treating RN: [birth year] (70 y.o. [name], Tammi Klippel Primary Care Belanna Manring: [name] Other Clinician: Referring Mita Vallo: Treating Remon Quinto/Extender: Salley Scarlet  Weeks in Treatment: 19 Active Inactive Wound/Skin Impairment Nursing Diagnoses: Impaired tissue integrity Knowledge deficit related to ulceration/compromised skin integrity Goals: Patient will demonstrate a reduced rate of smoking or cessation of smoking Date Initiated: 04/30/2021 Target Resolution Date: 10/09/2021 Goal Status: Active Patient/caregiver will verbalize understanding of skin care regimen Date Initiated: 04/30/2021 Date Inactivated: 07/09/2021 Target Resolution Date: 07/10/2021 Goal Status: Met Ulcer/skin breakdown will have a volume reduction of 50% by week 8 Date Initiated: 07/09/2021 Date Inactivated: 08/31/2021 Target Resolution  Date: 08/31/2021 Goal Status: Met Ulcer/skin breakdown will have a volume reduction of 80% by week 12 Date Initiated: 08/31/2021 Target Resolution Date: 09/28/2021 Goal Status: Active Interventions: Assess patient/caregiver ability to obtain necessary supplies Assess patient/caregiver ability to perform ulcer/skin care regimen upon admission and as needed Assess ulceration(s) every visit Notes: Electronic Signature(s) Signed: 09/14/2021 5:27:57 PM By: Deon Pilling RN, BSN Entered By: Deon Pilling on 09/14/2021 10:42:54 -------------------------------------------------------------------------------- Pain Assessment Details Patient Name: Date of Service: [name], [name] 09/14/2021 10:45 A M Medical Record Number: [id number] Patient Account Number: [id number] Date of Birth/Sex: Treating RN: [birth year] (70 y.o. [name] Primary Care Neha Waight: [name] Other Clinician: Referring Kong Packett: Treating Hanna Aultman/Extender: Alysia Penna in Treatment: 19 Active Problems Location of Pain Severity and Description of Pain Patient Has Paino Yes Site Locations Pain Location: Generalized Pain, Pain in Ulcers Rate the pain. Current Pain Level: 3 Character of Pain Describe the Pain: Heavy, Splitting, Stabbing Pain Management and Medication Current Pain Management: Medication: Yes Cold Application: No Rest: Yes Massage: No Activity: No T.E.N.S.: No Heat Application: No Leg drop or elevation: Yes Is the Current Pain Management Adequate: Adequate How does your wound impact your activities of daily livingo Sleep: No Bathing: No Appetite: No Relationship With Others: No Bladder Continence: No Emotions: No Bowel Continence: No Work: No Toileting: No Drive: No Dressing: No Hobbies: No Engineer, maintenance) Signed: 09/14/2021 5:27:57 PM By: Deon Pilling RN, BSN Entered By: Deon Pilling on 09/14/2021  10:38:40 -------------------------------------------------------------------------------- Patient/Caregiver Education Details Patient Name: Date of Service: [name], [name] 11/7/2022andnbsp10:45 A M Medical Record Number: [id number] Patient Account Number: [id number] Date of Birth/Gender: Treating RN: 03-Jan-1951 (70 y.o. [name] Primary Care Physician: [name] Other Clinician: Referring Physician: Treating Physician/Extender: Alysia Penna in Treatment: 16 Education Assessment Education Provided To: Patient Education Topics Provided Wound/Skin Impairment: Handouts: Skin Care Do's and Dont's Methods: Explain/Verbal Responses: Reinforcements needed Electronic Signature(s) Signed: 09/14/2021 5:27:57 PM By: Deon Pilling RN, BSN Entered By: Deon Pilling on 09/14/2021 10:43:06 -------------------------------------------------------------------------------- Wound Assessment Details Patient Name: Date of Service: [name], [name] 09/14/2021 10:45 A M Medical Record Number: [id number] Patient Account Number: [id number] Date of Birth/Sex: Treating RN: 1950-12-15 (70 y.o. [name] Primary Care Bellany Elbaum: [name] Other Clinician: Referring Cortina Vultaggio: Treating Aivan Fillingim/Extender: Alysia Penna in Treatment: 19 Wound Status Wound Number: 10 Primary Pressure Ulcer Etiology: Wound Location: Right Abdomen - Lower Quadrant Wound Open Wounding Event: Gradually Appeared Status: Date Acquired: 09/14/2021 Comorbid Cataracts, Glaucoma, Chronic sinus problems/congestion, Asthma, Weeks Of Treatment: 0 History: Hypertension, Type II Diabetes, Osteoarthritis, Neuropathy Clustered Wound: No Photos Wound Measurements Length: (cm) 0.8 Width: (cm) 0.7 Depth: (cm) 0.2 Area: (cm) 0.44 Volume: (cm) 0.088 % Reduction in Area: 0% % Reduction in Volume: 0% Epithelialization: Small (1-33%) Tunneling:  No Undermining: No Wound Description Classification: Category/Stage III Wound Margin: Distinct, outline attached Exudate Amount: Medium Exudate Type: Serosanguineous Exudate  Color: red, brown Foul Odor After Cleansing: No Slough/Fibrino Yes Wound Bed Granulation Amount: Medium (34-66%) Exposed Structure Granulation Quality: Red, Pink Fascia Exposed: No Necrotic Amount: Medium (34-66%) Fat Layer (Subcutaneous Tissue) Exposed: Yes Necrotic Quality: Adherent Slough Tendon Exposed: No Muscle Exposed: No Joint Exposed: No Bone Exposed: No Treatment Notes Wound #10 (Abdomen - Lower Quadrant) Wound Laterality: Right Cleanser Soap and Water Discharge Instruction: May shower and wash wound with dial antibacterial soap and water prior to dressing change. Peri-Wound Care Zinc Oxide Ointment 30g tube Discharge Instruction: As needed Apply Zinc Oxide to periwound with each dressing change Topical Primary Dressing KerraCel Ag Gelling Fiber Dressing, 4x5 in (silver alginate) Discharge Instruction: In clinic and patient to continue to use until interdry arrives. Apply silver alginate to wound bed as instructed Secondary Dressing Woven Gauze Sponge, Non-Sterile 4x4 in Discharge Instruction: Apply over primary dressing as directed. ABD Pad, 5x9 Discharge Instruction: Apply over primary dressing as directed. Secured With 30M Medipore H Soft Cloth Surgical T 4 x 2 (in/yd) ape Discharge Instruction: Secure dressing with tape as directed. Compression Wrap Compression Stockings Add-Ons Electronic Signature(s) Signed: 09/14/2021 5:20:47 PM By: Lorrin Jackson Signed: 09/14/2021 5:27:57 PM By: Deon Pilling RN, BSN Entered By: Deon Pilling on 09/14/2021 10:41:29 -------------------------------------------------------------------------------- Wound Assessment Details Patient Name: Date of Service: [name], [name] 09/14/2021 10:45 A M Medical Record Number: [id number] Patient Account  Number: [id number] Date of Birth/Sex: Treating RN: 08-Dec-1950 (70 y.o. [name] Primary Care Gwenneth Whiteman: [name] Other Clinician: Referring Nadia Torr: Treating Lamar Meter/Extender: Alysia Penna in Treatment: 19 Wound Status Wound Number: 11 Primary Pressure Ulcer Etiology: Wound Location: Left, Medial Upper Leg Wound Open Wounding Event: Gradually Appeared Status: Date Acquired: 09/14/2021 Comorbid Cataracts, Glaucoma, Chronic sinus problems/congestion, Asthma, Weeks Of Treatment: 0 History: Hypertension, Type II Diabetes, Osteoarthritis, Neuropathy Clustered Wound: No Photos Wound Measurements Length: (cm) 0.3 Width: (cm) 0.5 Depth: (cm) 0.2 Area: (cm) 0.118 Volume: (cm) 0.024 % Reduction in Area: 0% % Reduction in Volume: 0% Epithelialization: None Tunneling: No Undermining: No Wound Description Classification: Category/Stage II Wound Margin: Distinct, outline attached Exudate Amount: Medium Exudate Type: Purulent Exudate Color: yellow, brown, green Foul Odor After Cleansing: No Wound Bed Granulation Amount: Large (67-100%) Exposed Structure Granulation Quality: Red, Pink Fascia Exposed: No Necrotic Amount: None Present (0%) Fat Layer (Subcutaneous Tissue) Exposed: Yes Tendon Exposed: No Muscle Exposed: No Joint Exposed: No Bone Exposed: No Treatment Notes Wound #11 (Upper Leg) Wound Laterality: Left, Medial Cleanser Soap and Water Discharge Instruction: May shower and wash wound with dial antibacterial soap and water prior to dressing change. Peri-Wound Care Zinc Oxide Ointment 30g tube Discharge Instruction: As needed Apply Zinc Oxide to periwound with each dressing change Topical Primary Dressing KerraCel Ag Gelling Fiber Dressing, 4x5 in (silver alginate) Discharge Instruction: In clinic and patient to continue to use until interdry arrives. Apply silver alginate to wound bed as instructed Secondary  Dressing Woven Gauze Sponge, Non-Sterile 4x4 in Discharge Instruction: Apply over primary dressing as directed. ABD Pad, 5x9 Discharge Instruction: Apply over primary dressing as directed. Secured With 30M Medipore H Soft Cloth Surgical T 4 x 2 (in/yd) ape Discharge Instruction: Secure dressing with tape as directed. Compression Wrap Compression Stockings Add-Ons Electronic Signature(s) Signed: 09/14/2021 5:20:47 PM By: Lorrin Jackson Signed: 09/14/2021 5:27:57 PM By: Deon Pilling RN, BSN Entered By: Deon Pilling on 09/14/2021 10:42:10 -------------------------------------------------------------------------------- Wound Assessment Details Patient Name: Date of Service: MO RTO N, [name] 09/14/2021 10:45  A M Medical Record Number: [id number] Patient Account Number: [id number] Date of Birth/Sex: Treating RN: 04/04/1951 (70 y.o. [name] Primary Care Uri Turnbough: [name] Other Clinician: Referring Tiawanna Luchsinger: Treating Akito Boomhower/Extender: Alysia Penna in Treatment: 19 Wound Status Wound Number: 6 Primary Diabetic Wound/Ulcer of the Lower Extremity Etiology: Wound Location: Right Upper Leg Wound Open Wounding Event: Gradually Appeared Status: Date Acquired: 11/06/2020 Comorbid Cataracts, Glaucoma, Chronic sinus problems/congestion, Asthma, Weeks Of Treatment: 19 History: Hypertension, Type II Diabetes, Osteoarthritis, Neuropathy Clustered Wound: No Photos Wound Measurements Length: (cm) 1.5 Width: (cm) 1 Depth: (cm) 0.9 Area: (cm) 1.178 Volume: (cm) 1.06 % Reduction in Area: 95.8% % Reduction in Volume: 96.3% Epithelialization: Small (1-33%) Tunneling: No Undermining: No Wound Description Classification: Grade 2 Wound Margin: Distinct, outline attached Exudate Amount: Medium Exudate Type: Purulent Exudate Color: yellow, brown, green Foul Odor After Cleansing: No Slough/Fibrino Yes Wound Bed Granulation Amount: Large  (67-100%) Exposed Structure Granulation Quality: Red, Friable Fascia Exposed: No Necrotic Amount: Small (1-33%) Fat Layer (Subcutaneous Tissue) Exposed: Yes Necrotic Quality: Adherent Slough Tendon Exposed: No Muscle Exposed: No Joint Exposed: No Bone Exposed: No Treatment Notes Wound #6 (Upper Leg) Wound Laterality: Right Cleanser Soap and Water Discharge Instruction: May shower and wash wound with dial antibacterial soap and water prior to dressing change. Peri-Wound Care Zinc Oxide Ointment 30g tube Discharge Instruction: As needed Apply Zinc Oxide to periwound with each dressing change Topical Primary Dressing KerraCel Ag Gelling Fiber Dressing, 4x5 in (silver alginate) Discharge Instruction: In clinic and patient to continue to use until interdry arrives. Apply silver alginate to wound bed as instructed Secondary Dressing Woven Gauze Sponge, Non-Sterile 4x4 in Discharge Instruction: Apply over primary dressing as directed. ABD Pad, 5x9 Discharge Instruction: Apply over primary dressing as directed. Secured With 75M Medipore H Soft Cloth Surgical T 4 x 2 (in/yd) ape Discharge Instruction: Secure dressing with tape as directed. Compression Wrap Compression Stockings Add-Ons Electronic Signature(s) Signed: 09/14/2021 5:20:47 PM By: Lorrin Jackson Signed: 09/14/2021 5:27:57 PM By: Deon Pilling RN, BSN Entered By: Deon Pilling on 09/14/2021 10:40:26 -------------------------------------------------------------------------------- Wound Assessment Details Patient Name: Date of Service: [name], [name] 09/14/2021 10:45 A M Medical Record Number: [id number] Patient Account Number: [id number] Date of Birth/Sex: Treating RN: 1951-07-01 (70 y.o. [name] Primary Care Francina Beery: [name] Other Clinician: Referring Theadore Blunck: Treating Jaymie Mckiddy/Extender: Alysia Penna in Treatment: 19 Wound Status Wound Number: 8 Primary Diabetic  Wound/Ulcer of the Lower Extremity Etiology: Wound Location: Left, Lateral Upper Leg Wound Open Wounding Event: Gradually Appeared Status: Date Acquired: 01/06/2021 Comorbid Cataracts, Glaucoma, Chronic sinus problems/congestion, Asthma, Weeks Of Treatment: 19 History: Hypertension, Type II Diabetes, Osteoarthritis, Neuropathy Clustered Wound: No Photos Wound Measurements Length: (cm) 9.5 Width: (cm) 6 Depth: (cm) 5.4 Area: (cm) 44.768 Volume: (cm) 241.746 % Reduction in Area: 60.4% % Reduction in Volume: 14.5% Epithelialization: Small (1-33%) Tunneling: Yes Location 1 Position (o'clock): 9 Maximum Distance: (cm) 4.1 Location 2 Position (o'clock): 12 Maximum Distance: (cm) 3.2 Undermining: Yes Starting Position (o'clock): 7 Ending Position (o'clock): 9 Maximum Distance: (cm) 3.6 Wound Description Classification: Grade 2 Wound Margin: Distinct, outline attached Exudate Amount: Large Exudate Type: Serosanguineous Exudate Color: red, brown Foul Odor After Cleansing: Yes Due to Product Use: Yes Slough/Fibrino Yes Wound Bed Granulation Amount: Large (67-100%) Exposed Structure Granulation Quality: Red, Friable Fascia Exposed: No Necrotic Amount: Small (1-33%) Fat Layer (Subcutaneous Tissue) Exposed: Yes Necrotic Quality: Adherent Slough Tendon Exposed: No Muscle Exposed: Yes Necrosis of Muscle:  Yes Joint Exposed: No Bone Exposed: No Treatment Notes Wound #8 (Upper Leg) Wound Laterality: Left, Lateral Cleanser Soap and Water Discharge Instruction: May shower and wash wound with dial antibacterial soap and water prior to dressing change. Wound Cleanser Discharge Instruction: Cleanse the wound with wound cleanser prior to applying a clean dressing using gauze sponges, not tissue or cotton balls. Peri-Wound Care Topical Primary Dressing dakin's Discharge Instruction: wet to dry in clinic. Wound vac three times a week. Secondary Dressing Woven Gauze Sponges 2x2  in Discharge Instruction: Apply over primary dressing as directed. ABD Pad, 5x9 Discharge Instruction: Apply over primary dressing as directed. Secured With 65M Medipore H Soft Cloth Surgical T ape, 4 x 10 (in/yd) Discharge Instruction: Secure with tape as directed. Compression Wrap Compression Stockings Add-Ons Electronic Signature(s) Signed: 09/14/2021 5:20:47 PM By: Lorrin Jackson Signed: 09/14/2021 5:27:57 PM By: Deon Pilling RN, BSN Entered By: Deon Pilling on 09/14/2021 10:40:00 -------------------------------------------------------------------------------- Wound Assessment Details Patient Name: Date of Service: [name], [name] 09/14/2021 10:45 A M Medical Record Number: [id number] Patient Account Number: [id number] Date of Birth/Sex: Treating RN: 07/12/1951 (70 y.o. [name] Primary Care Arryana Tolleson: [name] Other Clinician: Referring Kelson Queenan: Treating Zayveon Raschke/Extender: Alysia Penna in Treatment: 19 Wound Status Wound Number: 9 Primary Lesion Etiology: Wound Location: Right, Proximal Upper Leg Wound Open Wounding Event: Other Lesion Status: Date Acquired: 08/19/2021 Comorbid Cataracts, Glaucoma, Chronic sinus problems/congestion, Asthma, Weeks Of Treatment: 2 History: Hypertension, Type II Diabetes, Osteoarthritis, Neuropathy Clustered Wound: No Photos Wound Measurements Length: (cm) 1.1 Width: (cm) 2 Depth: (cm) 0.2 Area: (cm) 1.728 Volume: (cm) 0.346 % Reduction in Area: -16.4% % Reduction in Volume: -16.5% Epithelialization: None Tunneling: No Undermining: No Wound Description Classification: Full Thickness Without Exposed Support Structu Wound Margin: Distinct, outline attached Exudate Amount: Medium Exudate Type: Serosanguineous Exudate Color: red, brown Wound Bed Granulation Amount: Large (67-100%) Granulation Quality: Pink, Pale, Friable Necrotic Amount: Small (1-33%) Necrotic Quality: Adherent  Slough res [employer] Odor After Cleansing: No Slough/Fibrino Yes Exposed Structure Fascia Exposed: No Fat Layer (Subcutaneous Tissue) Exposed: Yes Tendon Exposed: No Muscle Exposed: No Joint Exposed: No Bone Exposed: No Treatment Notes Wound #9 (Upper Leg) Wound Laterality: Right, Proximal Cleanser Soap and Water Discharge Instruction: May shower and wash wound with dial antibacterial soap and water prior to dressing change. Peri-Wound Care Zinc Oxide Ointment 30g tube Discharge Instruction: As needed Apply Zinc Oxide to periwound with each dressing change Topical Primary Dressing KerraCel Ag Gelling Fiber Dressing, 4x5 in (silver alginate) Discharge Instruction: In clinic and patient to continue to use until interdry arrives. Apply silver alginate to wound bed as instructed Secondary Dressing Woven Gauze Sponge, Non-Sterile 4x4 in Discharge Instruction: Apply over primary dressing as directed. ABD Pad, 5x9 Discharge Instruction: Apply over primary dressing as directed. Secured With 65M Medipore H Soft Cloth Surgical T 4 x 2 (in/yd) ape Discharge Instruction: Secure dressing with tape as directed. Compression Wrap Compression Stockings Add-Ons Electronic Signature(s) Signed: 09/14/2021 5:20:47 PM By: Lorrin Jackson Signed: 09/14/2021 5:27:57 PM By: Deon Pilling RN, BSN Entered By: Deon Pilling on 09/14/2021 10:39:07 -------------------------------------------------------------------------------- Vitals Details Patient Name: Date of Service: [name], [name] 09/14/2021 10:45 A M Medical Record Number: [id number] Patient Account Number: [id number] Date of Birth/Sex: Treating RN: 08/13/1951 (70 y.o. [name] Primary Care Kharisma Glasner: [name] Other Clinician: Referring Erman Thum: Treating Saabir Blyth/Extender: Alysia Penna in Treatment: 19 Vital Signs Time Taken: 10:15 Temperature (F): 98.1 Height (  in): 63 Pulse (bpm): 73 Weight  (lbs): 279 Respiratory Rate (breaths/min): 20 Body Mass Index (BMI): 49.4 Blood Pressure (mmHg): 120/79 Capillary Blood Glucose (mg/dl): 111 Reference Range: 80 - 120 mg / dl Electronic Signature(s) Signed: 09/14/2021 5:27:57 PM By: Deon Pilling RN, BSN Entered By: Deon Pilling on 09/14/2021 10:38:23

## 2021-09-14 NOTE — Progress Notes (Signed)
Yesenia, Payne ([id number]) Visit Report for 09/14/2021 Chief Complaint Document Details Patient Name: Date of Service: [place] Yesenia Payne, [name] 09/14/2021 10:45 A M Medical Record Number: [id number] Patient Account Number: [id number] Date of Birth/Sex: Treating RN: [birth year] (70 y.o. Yesenia Payne Primary Care Provider: Clovia Payne Other Clinician: Referring Provider: Treating Provider/Extender: Yesenia Payne in Treatment: 19 Information Obtained from: Patient Chief Complaint Left lower extremity wounds and right upper leg wound, Pannus wounds Electronic Signature(s) Signed: 09/14/2021 12:50:04 PM By: Yesenia Shan DO Entered By: Yesenia Payne on 09/14/2021 12:39:00 -------------------------------------------------------------------------------- HPI Details Patient Name: Date of Service: [place] Yesenia San, [name] 09/14/2021 10:45 A M Medical Record Number: [id number] Patient Account Number: [id number] Date of Birth/Sex: Treating RN: 1951/09/04 (70 y.o. 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 She has been seen in our clinic multiple times last seen in 03/2019 for right toe wounds. Today she presents for 3 wounds. 1 is located to the right upper leg and 2 to the left lower extremity. They started at the end of December And not sure how I started. She had not been using any dressings up until 2 weeks ago. She is currently using silver alginate to the right upper leg and Santyl to the wounds on the left leg. She has been on doxycycline and 2 rounds of clindamycin for  cellulitis to these wounds. She finished her last round of antibiotics 3 weeks ago. She reports minimal pain to the wound. She currently denies signs of infection including increased erythema, warmth or purulent drainage. 7/5; patient presents for 1 week follow-up. She has been using Santyl to the left lower extremity wounds and silver alginate to the right upper leg wound. She denies signs of infection. She has no complaints today. 7/19; patient presents for 1 week follow-up. She has been using Santyl to the left lower extremity wounds and silver alginate to the right upper leg wound. She denies signs of infection. She has had to use a lot of Santyl to the lateral wound since the necrotic tissue has been breaking down and the wound is deeper. 8/4; patient presents for 2-week follow-up. She has been using Santyl to the left lower extremity wound and silver alginate to the right upper leg wound. She has been using the wound VAC to the left lateral wound. She has no issues or complaints today. She denies signs of infection. 8/18; patient presents for 2-week follow-up. She has been using Santyl and silver alginate to the right upper wound. She reports that this is deeper today. She has been using the wound VAC to the left lateral wound. She has no issues or complaints today. She denies signs of infection. She reports that the left lower extremity medial wound is healed 9/1; patient presents for 2-week follow-up. She continues to use silver alginate to the right upper wound. She is using the wound VAC to the left lateral wound. She has no issues today. She denies signs of infection. She overall feels well. 10/10; patient presents for follow-up. Patient has not followed up in a month. She states that her husband was recently in the ICU but is now at home. She reports an increase  in the wound size to the right lower extremity. She reports improvement to the left lower extremity wound. She currently denies  signs of infection. She states she overall feels well. 10/24; patient presents for follow-up. She has a new wound to the right upper thigh. She has been keeping the area covered until yesterday when she started silver alginate. She continues to use Dakin wet-to-dry dressings to the other right upper leg wound. She continues to use the [facility] to the left lower extremity wound. No obvious signs of infection on exam. 11/7; patient presents for follow-up. She again has new wounds to her pannus folds. She is keeping the area dry with absorbent pads and using silver alginate to the wound beds. She continues to use the wound VAC to the left lower extremity wound without issues. She denies signs of infection. Electronic Signature(s) Signed: 09/14/2021 12:50:04 PM By: Yesenia Shan DO Entered By: Yesenia Payne on 09/14/2021 12:40:32 -------------------------------------------------------------------------------- Physical Exam Details Patient Name: Date of Service: [place] Yesenia San, [name] 09/14/2021 10:45 A M Medical Record Number: [id number] Patient Account Number: [id number] Date of Birth/Sex: Treating RN: 20-Aug-1951 (70 y.o. Yesenia Payne Primary Care Provider: Clovia Payne Other Clinician: Referring Provider: Treating Provider/Extender: Yesenia Payne in Treatment: 19 Constitutional respirations regular, non-labored and within target range for patient.Marland Kitchen Psychiatric pleasant and cooperative. Notes Left lower extremity: Large open wound with granulation tissue and scant nonviable tissue. 3 areas of tunneling. No obvious signs of infection. Overall appears healing. Now with several wounds to the pannus folds and thigh folds. None appear infected. Granulation tissue present but does not appear overly healthy. Electronic Signature(s) Signed: 09/14/2021 12:50:04 PM By: Yesenia Shan DO Entered By: Yesenia Payne on 09/14/2021  12:42:21 -------------------------------------------------------------------------------- Physician Orders Details Patient Name: Date of Service: [place] Yesenia San, [name] 09/14/2021 10:45 A M Medical Record Number: [id number] Patient Account Number: [id number] Date of Birth/Sex: Treating RN: 01/25/1951 (70 y.o. [name], Meta.Reding Primary Care Provider: Clovia Payne Other Clinician: Referring Provider: Treating Provider/Extender: Yesenia Payne in Treatment: 89 Verbal / Phone Orders: No Diagnosis Coding ICD-10 Coding Code Description [phone] 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ity L97.819 Non-pressure chronic ulcer of other part of right lower leg with unspecified severity E11.9 Type 2 diabetes mellitus without complications P61.950 Acquired absence of left leg below knee Follow-up Appointments ppointment in 2 weeks. - Dr. Heber Sawyer ***60 minutes extra time*** Return A Bathing/ Shower/ Hygiene Other Bathing/Shower/Hygiene Orders/Instructions: - You may sponge bathe Negative Presssure Wound Therapy Wound #8 Left,Lateral Upper Leg Wound Vac to wound continuously at 14mm/hg pressure - Change three times a week. Black Foam White Foam - White foam to deep areas at 7:00, 9:00, and 11:00 Off-Loading Turn and reposition every 2 hours Additional Orders / Instructions Other: - interdry for skin folds. Wound Treatment Wound #10 - Abdomen - Lower Quadrant Wound Laterality: Right Cleanser: Soap and Water 3 x Per DTO/67 Days Discharge Instructions: May shower and wash wound with dial antibacterial soap and water prior to dressing change. Peri-Wound Care: Zinc Oxide Ointment 30g tube 3 x Per Day/15 Days Discharge Instructions: As needed Apply Zinc Oxide to periwound with each dressing change Prim Dressing: KerraCel Ag Gelling Fiber Dressing, 4x5 in (silver alginate) 3 x Per Day/15 Days ary Discharge Instructions: In clinic and  patient to continue to use until interdry arrives. Apply silver alginate to wound bed as instructed Secondary Dressing: Woven Gauze Sponge,  Non-Sterile 4x4 in (Generic) 3 x Per Day/15 Days Discharge Instructions: Apply over primary dressing as directed. Secondary Dressing: ABD Pad, 5x9 (Generic) 3 x Per Day/15 Days Discharge Instructions: Apply over primary dressing as directed. Secured With: 48M Medipore H Soft Cloth Surgical T 4 x 2 (in/yd) (Generic) 3 x Per Day/15 Days ape Discharge Instructions: Secure dressing with tape as directed. Wound #11 - Upper Leg Wound Laterality: Left, Medial Cleanser: Soap and Water 3 x Per Day/15 Days Discharge Instructions: May shower and wash wound with dial antibacterial soap and water prior to dressing change. Peri-Wound Care: Zinc Oxide Ointment 30g tube 3 x Per Day/15 Days Discharge Instructions: As needed Apply Zinc Oxide to periwound with each dressing change Prim Dressing: KerraCel Ag Gelling Fiber Dressing, 4x5 in (silver alginate) 3 x Per Day/15 Days ary Discharge Instructions: In clinic and patient to continue to use until interdry arrives. Apply silver alginate to wound bed as instructed Secondary Dressing: Woven Gauze Sponge, Non-Sterile 4x4 in (Generic) 3 x Per Day/15 Days Discharge Instructions: Apply over primary dressing as directed. Secondary Dressing: ABD Pad, 5x9 (Generic) 3 x Per Day/15 Days Discharge Instructions: Apply over primary dressing as directed. Secured With: 48M Medipore H Soft Cloth Surgical T 4 x 2 (in/yd) (Generic) 3 x Per Day/15 Days ape Discharge Instructions: Secure dressing with tape as directed. Wound #6 - Upper Leg Wound Laterality: Right Cleanser: Soap and Water 3 x Per NTZ/00 Days Discharge Instructions: May shower and wash wound with dial antibacterial soap and water prior to dressing change. Peri-Wound Care: Zinc Oxide Ointment 30g tube 3 x Per Day/15 Days Discharge Instructions: As needed Apply Zinc Oxide to  periwound with each dressing change Prim Dressing: KerraCel Ag Gelling Fiber Dressing, 4x5 in (silver alginate) 3 x Per Day/15 Days ary Discharge Instructions: In clinic and patient to continue to use until interdry arrives. Apply silver alginate to wound bed as instructed Secondary Dressing: Woven Gauze Sponge, Non-Sterile 4x4 in (Generic) 3 x Per Day/15 Days Discharge Instructions: Apply over primary dressing as directed. Secondary Dressing: ABD Pad, 5x9 (Generic) 3 x Per Day/15 Days Discharge Instructions: Apply over primary dressing as directed. Secured With: 48M Medipore H Soft Cloth Surgical T 4 x 2 (in/yd) (Generic) 3 x Per Day/15 Days ape Discharge Instructions: Secure dressing with tape as directed. Wound #8 - Upper Leg Wound Laterality: Left, Lateral Cleanser: Soap and Water 3 x Per Day/15 Days Discharge Instructions: May shower and wash wound with dial antibacterial soap and water prior to dressing change. Cleanser: Wound Cleanser 3 x Per Day/15 Days Discharge Instructions: Cleanse the wound with wound cleanser prior to applying a clean dressing using gauze sponges, not tissue or cotton balls. Prim Dressing: dakin's 3 x Per FVC/94 Days ary Discharge Instructions: wet to dry in clinic. Wound vac three times a week. Secondary Dressing: Woven Gauze Sponges 2x2 in (Generic) 3 x Per Day/15 Days Discharge Instructions: Apply over primary dressing as directed. Secondary Dressing: ABD Pad, 5x9 (Generic) 3 x Per Day/15 Days Discharge Instructions: Apply over primary dressing as directed. Secured With: 48M Medipore H Soft Cloth Surgical T ape, 4 x 10 (in/yd) (Generic) 3 x Per Day/15 Days Discharge Instructions: Secure with tape as directed. Wound #9 - Upper Leg Wound Laterality: Right, Proximal Cleanser: Soap and Water 3 x Per WHQ/75 Days Discharge Instructions: May shower and wash wound with dial antibacterial soap and water prior to dressing change. Peri-Wound Care: Zinc Oxide Ointment  30g tube 3 x Per Day/15 Days  Discharge Instructions: As needed Apply Zinc Oxide to periwound with each dressing change Prim Dressing: KerraCel Ag Gelling Fiber Dressing, 4x5 in (silver alginate) 3 x Per Day/15 Days ary Discharge Instructions: In clinic and patient to continue to use until interdry arrives. Apply silver alginate to wound bed as instructed Secondary Dressing: Woven Gauze Sponge, Non-Sterile 4x4 in (Generic) 3 x Per Day/15 Days Discharge Instructions: Apply over primary dressing as directed. Secondary Dressing: ABD Pad, 5x9 (Generic) 3 x Per Day/15 Days Discharge Instructions: Apply over primary dressing as directed. Secured With: 74M Medipore H Soft Cloth Surgical T 4 x 2 (in/yd) (Generic) 3 x Per Day/15 Days ape Discharge Instructions: Secure dressing with tape as directed. Electronic Signature(s) Signed: 09/14/2021 12:50:04 PM By: Yesenia Shan DO Entered By: Yesenia Payne on 09/14/2021 12:42:40 -------------------------------------------------------------------------------- Problem List Details Patient Name: Date of Service: [place] Yesenia San, [name] 09/14/2021 10:45 A M Medical Record Number: [id number] Patient Account Number: [id number] Date of Birth/Sex: Treating RN: [birth year] (70 y.o. [name], Tammi Klippel Primary Care Provider: Clovia Payne Other Clinician: Referring Provider: Treating Provider/Extender: Yesenia Payne in Treatment: 19 Active Problems ICD-10 Encounter Code Description Active Date MDM Diagnosis L97.829 Non-pressure chronic ulcer of other part of left lower leg with unspecified 04/30/2021 No Yes severity L97.819 Non-pressure chronic ulcer of other part of right lower leg with unspecified 04/30/2021 No Yes severity E11.9 Type 2 diabetes mellitus without complications 4/85/4627 No Yes Z89.512 Acquired absence of left leg below knee 04/30/2021 No Yes Inactive Problems Resolved Problems Electronic Signature(s) Signed:  09/14/2021 12:50:04 PM By: Yesenia Shan DO Entered By: Yesenia Payne on 09/14/2021 12:38:35 -------------------------------------------------------------------------------- Progress Note Details Patient Name: Date of Service: [place] Yesenia San, [place] RET D. 09/14/2021 10:45 A M Medical Record Number: [id number] Patient Account Number: [id number] Date of Birth/Sex: Treating RN: 05-Oct-1951 (70 y.o. Yesenia Payne Primary Care Provider: Clovia Payne Other Clinician: Referring Provider: Treating Provider/Extender: Yesenia Payne in Treatment: 19 Subjective Chief Complaint Information obtained from Patient Left lower extremity wounds and right upper leg wound, Pannus wounds History of Present Illness (HPI) Admission 04/30/2021 Ms. 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 She has been seen in our clinic multiple times last seen in 03/2019 for right toe wounds. Today she presents for 3 wounds. 1 is located to the right upper leg and 2 to the left lower extremity. They started at the end of December And not sure how I started. She had not been using any dressings up until 2 weeks ago. She is currently using silver alginate to the right upper leg and Santyl to the wounds on the left leg. She has been on doxycycline and 2 rounds of clindamycin for cellulitis to these wounds. She finished her last round of antibiotics 3 weeks ago. She reports minimal pain to the wound. She currently denies signs of infection including increased erythema, warmth or purulent drainage. 7/5; patient presents for 1 week follow-up. She has been using Santyl to the left lower extremity wounds and silver alginate to the right upper leg wound. She denies signs of infection. She has no complaints today. 7/19; patient presents for 1 week follow-up. She has been using  Santyl to the left lower extremity wounds and silver alginate to the right upper leg wound. She denies signs of infection. She has had to use a lot of Santyl to the  lateral wound since the necrotic tissue has been breaking down and the wound is deeper. 8/4; patient presents for 2-week follow-up. She has been using Santyl to the left lower extremity wound and silver alginate to the right upper leg wound. She has been using the wound VAC to the left lateral wound. She has no issues or complaints today. She denies signs of infection. 8/18; patient presents for 2-week follow-up. She has been using Santyl and silver alginate to the right upper wound. She reports that this is deeper today. She has been using the wound VAC to the left lateral wound. She has no issues or complaints today. She denies signs of infection. She reports that the left lower extremity medial wound is healed 9/1; patient presents for 2-week follow-up. She continues to use silver alginate to the right upper wound. She is using the wound VAC to the left lateral wound. She has no issues today. She denies signs of infection. She overall feels well. 10/10; patient presents for follow-up. Patient has not followed up in a month. She states that her husband was recently in the ICU but is now at home. She reports an increase in the wound size to the right lower extremity. She reports improvement to the left lower extremity wound. She currently denies signs of infection. She states she overall feels well. 10/24; patient presents for follow-up. She has a new wound to the right upper thigh. She has been keeping the area covered until yesterday when she started silver alginate. She continues to use Dakin wet-to-dry dressings to the other right upper leg wound. She continues to use the [facility] to the left lower extremity wound. No obvious signs of infection on exam. 11/7; patient presents for follow-up. She again has new wounds to her pannus folds. She  is keeping the area dry with absorbent pads and using silver alginate to the wound beds. She continues to use the wound VAC to the left lower extremity wound without issues. She denies signs of infection. Patient History Information obtained from Patient. Family History Cancer - Child, Diabetes - Father, Heart Disease - Mother,Father, Hypertension - Mother,Father, No family history of Hereditary Spherocytosis, Kidney Disease, Lung Disease, Seizures, Stroke, Thyroid Problems, Tuberculosis. Social History Never smoker, Marital Status - Married, Alcohol Use - Never, Drug Use - No History, Caffeine Use - Daily - coffee , sodas. Medical History Eyes Patient has history of Cataracts - lens implant done, Glaucoma Denies history of Optic Neuritis Ear/Nose/Mouth/Throat Patient has history of Chronic sinus problems/congestion - seasonal Denies history of Middle ear problems Hematologic/Lymphatic Denies history of Anemia, Hemophilia, Human Immunodeficiency Virus, Lymphedema, Sickle Cell Disease Respiratory Patient has history of Asthma Denies history of Aspiration, Chronic Obstructive Pulmonary Disease (COPD), Pneumothorax, Sleep Apnea, Tuberculosis Cardiovascular Patient has history of Hypertension - not on meds presently Denies history of Angina, Arrhythmia, Congestive Heart Failure, Coronary Artery Disease, Deep Vein Thrombosis, Hypotension, Myocardial Infarction, Peripheral Arterial Disease, Peripheral Venous Disease, Phlebitis, Vasculitis Gastrointestinal Denies history of Cirrhosis , Colitis, Crohnoos, Hepatitis A, Hepatitis B, Hepatitis C Endocrine Patient has history of Type II Diabetes Denies history of Type I Diabetes Genitourinary Denies history of End Stage Renal Disease Immunological Denies history of Lupus Erythematosus, Raynaudoos, Scleroderma Integumentary (Skin) Denies history of History of Burn Musculoskeletal Patient has history of Osteoarthritis Denies history of  Gout, Rheumatoid Arthritis, Osteomyelitis Neurologic Patient has history of Neuropathy Denies history of Dementia, Quadriplegia, Paraplegia, Seizure Disorder Oncologic Denies history of Received Chemotherapy, Received Radiation Psychiatric Denies history of Anorexia/bulimia,  Confinement Anxiety Medical A Surgical History Notes nd Cardiovascular Hyperlipidemia Gastrointestinal GERD, Diverticulitis Genitourinary Overactive Bladder Musculoskeletal Fibromyalgia, toe amputations x 3 Psychiatric Anxiety Objective Constitutional respirations regular, non-labored and within target range for patient.. Vitals Time Taken: 10:15 AM, Height: 63 in, Weight: 279 lbs, BMI: 49.4, Temperature: 98.1 F, Pulse: 73 bpm, Respiratory Rate: 20 breaths/min, Blood Pressure: 120/79 mmHg, Capillary Blood Glucose: 111 mg/dl. Psychiatric pleasant and cooperative. General Notes: Left lower extremity: Large open wound with granulation tissue and scant nonviable tissue. 3 areas of tunneling. No obvious signs of infection. Overall appears healing. Now with several wounds to the pannus folds and thigh folds. None appear infected. Granulation tissue present but does not appear overly healthy. Integumentary (Hair, Skin) Wound #10 status is Open. Original cause of wound was Gradually Appeared. The date acquired was: 09/14/2021. The wound is located on the Right Abdomen - Lower Quadrant. The wound measures 0.8cm length x 0.7cm width x 0.2cm depth; 0.44cm^2 area and 0.088cm^3 volume. There is Fat Layer (Subcutaneous Tissue) exposed. There is no tunneling or undermining noted. There is a medium amount of serosanguineous drainage noted. The wound margin is distinct with the outline attached to the wound base. There is medium (34-66%) red, pink granulation within the wound bed. There is a medium (34-66%) amount of necrotic tissue within the wound bed including Adherent Slough. Wound #11 status is Open. Original cause of  wound was Gradually Appeared. The date acquired was: 09/14/2021. The wound is located on the Left,Medial Upper Leg. The wound measures 0.3cm length x 0.5cm width x 0.2cm depth; 0.118cm^2 area and 0.024cm^3 volume. There is Fat Layer (Subcutaneous Tissue) exposed. There is no tunneling or undermining noted. There is a medium amount of purulent drainage noted. The wound margin is distinct with the outline attached to the wound base. There is large (67-100%) red, pink granulation within the wound bed. There is no necrotic tissue within the wound bed. Wound #6 status is Open. Original cause of wound was Gradually Appeared. The date acquired was: 11/06/2020. The wound has been in treatment 19 weeks. The wound is located on the Right Upper Leg. The wound measures 1.5cm length x 1cm width x 0.9cm depth; 1.178cm^2 area and 1.06cm^3 volume. There is Fat Layer (Subcutaneous Tissue) exposed. There is no tunneling or undermining noted. There is a medium amount of purulent drainage noted. The wound margin is distinct with the outline attached to the wound base. There is large (67-100%) red, friable granulation within the wound bed. There is a small (1-33%) amount of necrotic tissue within the wound bed including Adherent Slough. Wound #8 status is Open. Original cause of wound was Gradually Appeared. The date acquired was: 01/06/2021. The wound has been in treatment 19 weeks. The wound is located on the Left,Lateral Upper Leg. The wound measures 9.5cm length x 6cm width x 5.4cm depth; 44.768cm^2 area and 241.746cm^3 volume. There is muscle and Fat Layer (Subcutaneous Tissue) exposed. Tunneling has been noted at 9:00 with a maximum distance of 4.1cm. There is additional tunneling and at 12:00 with a maximum distance of 3.2cm. Undermining begins at 7:00 and ends at 9:00 with a maximum distance of 3.6cm. There is a large amount of serosanguineous drainage noted. Foul odor after cleansing was noted. Foul odor after  cleansing was noted due to product use. The wound margin is distinct with the outline attached to the wound base. There is large (67-100%) red, friable granulation within the wound bed. There is a small (1-33%) amount of necrotic tissue  within the wound bed including Adherent Slough and Necrosis of Muscle. Wound #9 status is Open. Original cause of wound was Other Lesion. The date acquired was: 08/19/2021. The wound has been in treatment 2 weeks. The wound is located on the Right,Proximal Upper Leg. The wound measures 1.1cm length x 2cm width x 0.2cm depth; 1.728cm^2 area and 0.346cm^3 volume. There is Fat Layer (Subcutaneous Tissue) exposed. There is no tunneling or undermining noted. There is a medium amount of serosanguineous drainage noted. The wound margin is distinct with the outline attached to the wound base. There is large (67-100%) pink, pale, friable granulation within the wound bed. There is a small (1-33%) amount of necrotic tissue within the wound bed including [place]. 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 I recommended continuing with the wound VAC and white foam to the areas of tunneling. She may need to switch to gauze soon to the narrow areas that tunnel but will reassess at next clinic visit. She continues to have wounds to the inner folds of her pannus and thighs. We had a long discussion about the importance of cleaning these wounds keeping them dry and dressing them several times a day. I think the patient and her daughter have done a great job doing this however I recommended doing this now 2-3 times a day.  These wounds will likely not heal due to the body habitus of the patient. There is constant moisture and friction occurring. She develops new wounds on a weekly basis now. I recommended Interdry to help address this. She knows these will be hard to heal. There are no signs of infection on exam today. Plan Follow-up Appointments: Return Appointment in 2 weeks. - Dr. Heber Elon ***60 minutes extra time*** Bathing/ Shower/ Hygiene: Other Bathing/Shower/Hygiene Orders/Instructions: - You may sponge bathe Negative Presssure Wound Therapy: Wound #8 Left,Lateral Upper Leg: Wound Vac to wound continuously at 180mm/hg pressure - Change three times a week. Black Foam White Foam - White foam to deep areas at 7:00, 9:00, and 11:00 Off-Loading: Turn and reposition every 2 hours Additional Orders / Instructions: Other: - interdry for skin folds. WOUND #10: - Abdomen - Lower Quadrant Wound Laterality: Right Cleanser: Soap and Water 3 x Per XJD/55 Days Discharge Instructions: May shower and wash wound with dial antibacterial soap and water prior to dressing change. Peri-Wound Care: Zinc Oxide Ointment 30g tube 3 x Per Day/15 Days Discharge Instructions: As needed Apply Zinc Oxide to periwound with each dressing change Prim Dressing: KerraCel Ag Gelling Fiber Dressing, 4x5 in (silver alginate) 3 x Per Day/15 Days ary Discharge Instructions: In clinic and patient to continue to use until interdry arrives. Apply silver alginate to wound bed as instructed Secondary Dressing: Woven Gauze Sponge, Non-Sterile 4x4 in (Generic) 3 x Per Day/15 Days Discharge Instructions: Apply over primary dressing as directed. Secondary Dressing: ABD Pad, 5x9 (Generic) 3 x Per Day/15 Days Discharge Instructions: Apply over primary dressing as directed. Secured With: 62M Medipore H Soft Cloth Surgical T 4 x 2 (in/yd) (Generic) 3 x Per Day/15 Days ape Discharge Instructions: Secure dressing with tape as directed. WOUND #11: -  Upper Leg Wound Laterality: Left, Medial Cleanser: Soap and Water 3 x  Per Day/15 Days Discharge Instructions: May shower and wash wound with dial antibacterial soap and water prior to dressing change. Peri-Wound Care: Zinc Oxide Ointment 30g tube 3 x Per Day/15 Days Discharge Instructions: As needed Apply Zinc Oxide to periwound with each dressing change Prim Dressing: KerraCel Ag Gelling Fiber Dressing, 4x5 in (silver alginate) 3 x Per Day/15 Days ary Discharge Instructions: In clinic and patient to continue to use until interdry arrives. Apply silver alginate to wound bed as instructed Secondary Dressing: Woven Gauze Sponge, Non-Sterile 4x4 in (Generic) 3 x Per Day/15 Days Discharge Instructions: Apply over primary dressing as directed. Secondary Dressing: ABD Pad, 5x9 (Generic) 3 x Per Day/15 Days Discharge Instructions: Apply over primary dressing as directed. Secured With: 79M Medipore H Soft Cloth Surgical T 4 x 2 (in/yd) (Generic) 3 x Per Day/15 Days ape Discharge Instructions: Secure dressing with tape as directed. WOUND #6: - Upper Leg Wound Laterality: Right Cleanser: Soap and Water 3 x Per XJO/83 Days Discharge Instructions: May shower and wash wound with dial antibacterial soap and water prior to dressing change. Peri-Wound Care: Zinc Oxide Ointment 30g tube 3 x Per Day/15 Days Discharge Instructions: As needed Apply Zinc Oxide to periwound with each dressing change Prim Dressing: KerraCel Ag Gelling Fiber Dressing, 4x5 in (silver alginate) 3 x Per Day/15 Days ary Discharge Instructions: In clinic and patient to continue to use until interdry arrives. Apply silver alginate to wound bed as instructed Secondary Dressing: Woven Gauze Sponge, Non-Sterile 4x4 in (Generic) 3 x Per Day/15 Days Discharge Instructions: Apply over primary dressing as directed. Secondary Dressing: ABD Pad, 5x9 (Generic) 3 x Per Day/15 Days Discharge Instructions: Apply over primary dressing as  directed. Secured With: 79M Medipore H Soft Cloth Surgical T 4 x 2 (in/yd) (Generic) 3 x Per Day/15 Days ape Discharge Instructions: Secure dressing with tape as directed. WOUND #8: - Upper Leg Wound Laterality: Left, Lateral Cleanser: Soap and Water 3 x Per Day/15 Days Discharge Instructions: May shower and wash wound with dial antibacterial soap and water prior to dressing change. Cleanser: Wound Cleanser 3 x Per Day/15 Days Discharge Instructions: Cleanse the wound with wound cleanser prior to applying a clean dressing using gauze sponges, not tissue or cotton balls. Prim Dressing: dakin's 3 x Per GPQ/98 Days ary Discharge Instructions: wet to dry in clinic. Wound vac three times a week. Secondary Dressing: Woven Gauze Sponges 2x2 in (Generic) 3 x Per Day/15 Days Discharge Instructions: Apply over primary dressing as directed. Secondary Dressing: ABD Pad, 5x9 (Generic) 3 x Per Day/15 Days Discharge Instructions: Apply over primary dressing as directed. Secured With: 79M Medipore H Soft Cloth Surgical T ape, 4 x 10 (in/yd) (Generic) 3 x Per Day/15 Days Discharge Instructions: Secure with tape as directed. WOUND #9: - Upper Leg Wound Laterality: Right, Proximal Cleanser: Soap and Water 3 x Per Day/15 Days Discharge Instructions: May shower and wash wound with dial antibacterial soap and water prior to dressing change. Peri-Wound Care: Zinc Oxide Ointment 30g tube 3 x Per Day/15 Days Discharge Instructions: As needed Apply Zinc Oxide to periwound with each dressing change Prim Dressing: KerraCel Ag Gelling Fiber Dressing, 4x5 in (silver alginate) 3 x Per Day/15 Days ary Discharge Instructions: In clinic and patient to continue to use until interdry arrives. Apply silver alginate to wound bed as instructed Secondary Dressing: Woven Gauze Sponge, Non-Sterile 4x4 in (Generic) 3 x Per Day/15 Days Discharge Instructions: Apply over primary dressing as directed. Secondary Dressing: ABD Pad, 5x9  (Generic)  3 x Per Day/15 Days Discharge Instructions: Apply over primary dressing as directed. Secured With: 19M Medipore H Soft Cloth Surgical T 4 x 2 (in/yd) (Generic) 3 x Per Day/15 Days ape Discharge Instructions: Secure dressing with tape as directed. 1. Interdry 2. Wound VAC 3. Follow-up in 2 weeks Electronic Signature(s) Signed: 09/14/2021 12:50:04 PM By: Yesenia Shan DO Entered By: Yesenia Payne on 09/14/2021 12:49:14 -------------------------------------------------------------------------------- HxROS Details Patient Name: Date of Service: [place] Yesenia San, [name] 09/14/2021 10:45 A M Medical Record Number: [id number] Patient Account Number: [id number] Date of Birth/Sex: Treating RN: 1951-03-29 (70 y.o. Yesenia Payne Primary Care Provider: Clovia Payne Other Clinician: Referring Provider: Treating Provider/Extender: Yesenia Payne in Treatment: 19 Information Obtained From Patient Eyes Medical History: Positive for: Cataracts - lens implant done; Glaucoma Negative for: Optic Neuritis Ear/Nose/Mouth/Throat Medical History: Positive for: Chronic sinus problems/congestion - seasonal Negative for: Middle ear problems Hematologic/Lymphatic Medical History: Negative for: Anemia; Hemophilia; Human Immunodeficiency Virus; Lymphedema; Sickle Cell Disease Respiratory Medical History: Positive for: Asthma Negative for: Aspiration; Chronic Obstructive Pulmonary Disease (COPD); Pneumothorax; Sleep Apnea; Tuberculosis Cardiovascular Medical History: Positive for: Hypertension - not on meds presently Negative for: Angina; Arrhythmia; Congestive Heart Failure; Coronary Artery Disease; Deep Vein Thrombosis; Hypotension; Myocardial Infarction; Peripheral Arterial Disease; Peripheral Venous Disease; Phlebitis; Vasculitis Past Medical History Notes: Hyperlipidemia Gastrointestinal Medical History: Negative for: Cirrhosis ; Colitis; Crohns;  Hepatitis A; Hepatitis B; Hepatitis C Past Medical History Notes: GERD, Diverticulitis Endocrine Medical History: Positive for: Type II Diabetes Negative for: Type I Diabetes Time with diabetes: 12 years Treated with: Insulin Blood sugar tested every day: Yes T ested : bid Blood sugar testing results: Breakfast: 140 max; Bedtime: 220's max Genitourinary Medical History: Negative for: End Stage Renal Disease Past Medical History Notes: Overactive Bladder Immunological Medical History: Negative for: Lupus Erythematosus; Raynauds; Scleroderma Integumentary (Skin) Medical History: Negative for: History of Burn Musculoskeletal Medical History: Positive for: Osteoarthritis Negative for: Gout; Rheumatoid Arthritis; Osteomyelitis Past Medical History Notes: Fibromyalgia, toe amputations x 3 Neurologic Medical History: Positive for: Neuropathy Negative for: Dementia; Quadriplegia; Paraplegia; Seizure Disorder Oncologic Medical History: Negative for: Received Chemotherapy; Received Radiation Psychiatric Medical History: Negative for: Anorexia/bulimia; Confinement Anxiety Past Medical History Notes: Anxiety HBO Extended History Items Ear/Nose/Mouth/Throat: Eyes: Eyes: Chronic sinus Cataracts Glaucoma problems/congestion Immunizations Pneumococcal Vaccine: Received Pneumococcal Vaccination: Yes Received Pneumococcal Vaccination On or After 60th Birthday: No Immunization Notes: up to date on pneumonia and tetanus shots Implantable Devices None Family and Social History Cancer: Yes - Child; Diabetes: Yes - Father; Heart Disease: Yes - Mother,Father; Hereditary Spherocytosis: No; Hypertension: Yes - Mother,Father; Kidney Disease: No; Lung Disease: No; Seizures: No; Stroke: No; Thyroid Problems: No; Tuberculosis: No; Never smoker; Marital Status - Married; Alcohol Use: Never; Drug Use: No History; Caffeine Use: Daily - coffee , sodas; Financial Concerns: No; Food, Clothing  or Shelter Needs: No; Support System Lacking: No; Transportation Concerns: No Electronic Signature(s) Signed: 09/14/2021 12:50:04 PM By: Yesenia Shan DO Signed: 09/14/2021 5:20:47 PM By: Lorrin Jackson Entered By: Yesenia Payne on 09/14/2021 12:40:40 -------------------------------------------------------------------------------- SuperBill Details Patient Name: Date of Service: [place] Yesenia San, [place] RET D. 09/14/2021 Medical Record Number: [id number] Patient Account Number: [id number] Date of Birth/Sex: Treating RN: 13-May-1951 (70 y.o. Yesenia Payne Primary Care Provider: Clovia Payne Other Clinician: Referring Provider: Treating Provider/Extender: Yesenia Payne in Treatment: 19 Diagnosis Coding ICD-10 Codes Code Description [phone] 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ity L97.819 Non-pressure  chronic ulcer of other part of right lower leg with unspecified severity E11.9 Type 2 diabetes mellitus without complications V77.939 Acquired absence of left leg below knee Facility Procedures CPT4 Code: [id number] Description: [phone] - WOUND CARE VISIT-LEV 5 EST PT Modifier: Quantity: 1 Physician Procedures : CPT4 Code Description Modifier [id number] - WC PHYS LEVEL 3 - EST PT ICD-10 Diagnosis Description 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ity L97.819 Non-pressure chronic ulcer of other part of right  lower leg with unspecified severity E11.9 Type 2 diabetes mellitus without complications T62.563 Acquired absence of left leg below knee Quantity: 1 Electronic Signature(s) Signed: 09/14/2021 12:50:04 PM By: Yesenia Shan DO Entered By: Yesenia Payne on 09/14/2021 12:49:30

## 2021-09-26 DIAGNOSIS — E1169 Type 2 diabetes mellitus with other specified complication: Secondary | ICD-10-CM | POA: Diagnosis not present

## 2021-09-26 DIAGNOSIS — L97829 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ity: Secondary | ICD-10-CM | POA: Diagnosis not present

## 2021-09-28 ENCOUNTER — Other Ambulatory Visit: Payer: Self-pay

## 2021-09-28 ENCOUNTER — Encounter (HOSPITAL_BASED_OUTPATIENT_CLINIC_OR_DEPARTMENT_OTHER): Payer: Medicare Other | Admitting: Internal Medicine

## 2021-09-28 DIAGNOSIS — L97829 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ity: Secondary | ICD-10-CM

## 2021-09-28 DIAGNOSIS — S31109D Unspecified open wound of abdominal wall, unspecified quadrant without penetration into peritoneal cavity, subsequent encounter: Secondary | ICD-10-CM

## 2021-09-28 DIAGNOSIS — Z89512 Acquired absence of left leg below knee: Secondary | ICD-10-CM

## 2021-09-28 DIAGNOSIS — Z7401 Bed confinement status: Secondary | ICD-10-CM | POA: Diagnosis not present

## 2021-09-28 DIAGNOSIS — L97819 Non-pressure chronic ulcer of other part of right lower leg with unspecified severity: Secondary | ICD-10-CM | POA: Diagnosis not present

## 2021-09-28 DIAGNOSIS — Z794 Long term (current) use of insulin: Secondary | ICD-10-CM | POA: Diagnosis not present

## 2021-09-28 DIAGNOSIS — S31109A Unspecified open wound of abdominal wall, unspecified quadrant without penetration into peritoneal cavity, initial encounter: Secondary | ICD-10-CM | POA: Diagnosis not present

## 2021-09-28 DIAGNOSIS — E1142 Type 2 diabetes mellitus with diabetic polyneuropathy: Secondary | ICD-10-CM | POA: Diagnosis not present

## 2021-09-28 DIAGNOSIS — I10 Essential (primary) hypertension: Secondary | ICD-10-CM | POA: Diagnosis not present

## 2021-09-28 DIAGNOSIS — E11622 Type 2 diabetes mellitus with other skin ulcer: Secondary | ICD-10-CM | POA: Diagnosis not present

## 2021-09-28 DIAGNOSIS — R2689 Other abnormalities of gait and mobility: Secondary | ICD-10-CM | POA: Diagnosis not present

## 2021-09-28 DIAGNOSIS — R0902 Hypoxemia: Secondary | ICD-10-CM | POA: Diagnosis not present

## 2021-09-28 NOTE — Progress Notes (Signed)
[name], [name] ([id number]) Visit Report for 09/28/2021 Chief Complaint Document Details Patient Name: Date of Service: [name], [name] 09/28/2021 10:45 A M Medical Record Number: [id number] Patient Account Number: [id number] Date of Birth/Sex: Treating RN: [birth year] (70 y.o. [name] Primary Care Provider: [name] Other Clinician: Referring Provider: Treating Provider/Extender: Alysia Penna in Treatment: 21 Information Obtained from: Patient Chief Complaint Left lower extremity wounds and right upper leg wound, Pannus wounds Electronic Signature(s) Signed: 09/28/2021 12:26:16 PM By: Kalman Shan DO Entered By: Kalman Shan on 09/28/2021 11:58:28 -------------------------------------------------------------------------------- HPI Details Patient Name: Date of Service: [name], [name] 09/28/2021 10:45 A M Medical Record Number: [id number] Patient Account Number: [id number] Date of Birth/Sex: Treating RN: 04/26/1951 (70 y.o. 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 She has been seen in our clinic multiple times last seen in 03/2019 for right toe wounds. Today she presents for 3 wounds. 1 is located to the right upper leg and 2 to the left lower extremity. They started at the end of December And not sure how I started. She had not been using any dressings up until 2 weeks ago. She is currently using silver alginate to the right upper leg and Santyl to the wounds on the left leg. She has been on doxycycline and 2 rounds of clindamycin for  cellulitis to these wounds. She finished her last round of antibiotics 3 weeks ago. She reports minimal pain to the wound. She currently denies signs of infection including increased erythema, warmth or purulent drainage. 7/5; patient presents for 1 week follow-up. She has been using Santyl to the left lower extremity wounds and silver alginate to the right upper leg wound. She denies signs of infection. She has no complaints today. 7/19; patient presents for 1 week follow-up. She has been using Santyl to the left lower extremity wounds and silver alginate to the right upper leg wound. She denies signs of infection. She has had to use a lot of Santyl to the lateral wound since the necrotic tissue has been breaking down and the wound is deeper. 8/4; patient presents for 2-week follow-up. She has been using Santyl to the left lower extremity wound and silver alginate to the right upper leg wound. She has been using the wound VAC to the left lateral wound. She has no issues or complaints today. She denies signs of infection. 8/18; patient presents for 2-week follow-up. She has been using Santyl and silver alginate to the right upper wound. She reports that this is deeper today. She has been using the wound VAC to the left lateral wound. She has no issues or complaints today. She denies signs of infection. She reports that the left lower extremity medial wound is healed 9/1; patient presents for 2-week follow-up. She continues to use silver alginate to the right upper wound. She is using the wound VAC to the left lateral wound. She has no issues today. She denies signs of infection. She overall feels well. 10/10; patient presents for follow-up. Patient has not followed up in a month. She states that her husband was recently in the ICU but is now at home. She reports an increase  in the wound size to the right lower extremity. She reports improvement to the left lower extremity wound. She currently denies  signs of infection. She states she overall feels well. 10/24; patient presents for follow-up. She has a new wound to the right upper thigh. She has been keeping the area covered until yesterday when she started silver alginate. She continues to use Dakin wet-to-dry dressings to the other right upper leg wound. She continues to use the [facility] to the left lower extremity wound. No obvious signs of infection on exam. 11/7; patient presents for follow-up. She again has new wounds to her pannus folds. She is keeping the area dry with absorbent pads and using silver alginate to the wound beds. She continues to use the wound VAC to the left lower extremity wound without issues. She denies signs of infection. 11/21; patient presents for follow-up. She continues to have new wounds to her pannus folds. Her daughter helps with dressing changes and is keeping the areas dry with absorbent pads and using silver alginate to the wound beds. She continues to use the wound VAC to the left lower extremity wound without issues. She currently denies signs of infection. Electronic Signature(s) Signed: 09/28/2021 12:26:16 PM By: Kalman Shan DO Entered By: Kalman Shan on 09/28/2021 11:59:14 -------------------------------------------------------------------------------- Physical Exam Details Patient Name: Date of Service: [name], [name] 09/28/2021 10:45 A M Medical Record Number: [id number] Patient Account Number: [id number] Date of Birth/Sex: Treating RN: September 05, 1951 (70 y.o. [name] Primary Care Provider: [name] Other Clinician: Referring Provider: Treating Provider/Extender: Alysia Penna in Treatment: 21 Constitutional respirations regular, non-labored and within target range for patient.Marland Kitchen Psychiatric pleasant and cooperative. Notes Left lower extremity: Large open wound with granulation tissue and three areas of tunneling. No obvious signs of  infection. Overall appears well-healing. Several wounds to the pannus folds and thigh folds. She has a few areas of folliculitis to these folds. Electronic Signature(s) Signed: 09/28/2021 12:26:16 PM By: Kalman Shan DO Entered By: Kalman Shan on 09/28/2021 12:06:27 -------------------------------------------------------------------------------- Physician Orders Details Patient Name: Date of Service: [name], [name] 09/28/2021 10:45 A M Medical Record Number: [id number] Patient Account Number: [id number] Date of Birth/Sex: Treating RN: 09/21/1951 (70 y.o. Nancy Fetter Primary Care Provider: [name] Other Clinician: Referring Provider: Treating Provider/Extender: Alysia Penna in Treatment: 21 Verbal / Phone Orders: No Diagnosis Coding ICD-10 Coding Code Description [phone] 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ity L97.819 Non-pressure chronic ulcer of other part of right lower leg with unspecified severity E11.9 Type 2 diabetes mellitus without complications A12.878 Acquired absence of left leg below knee Follow-up Appointments ppointment in 2 weeks. - Dr. Heber [place] - ***STRETCHER - 60 minutes extra time*** Return A Bathing/ Shower/ Hygiene Other Bathing/Shower/Hygiene Orders/Instructions: - You may sponge bathe Negative Presssure Wound Therapy Wound #8 Left,Lateral Upper Leg Wound Vac to wound continuously at 136m/hg pressure - Change three times a week. Black Foam White Foam - White foam to deep areas at 7:00, 9:00, and 11:00 Off-Loading Turn and reposition every 2 hours Additional Orders / Instructions Other: - interdry for skin folds. Wound Treatment Wound #10 - Abdomen - Lower Quadrant Wound Laterality: Right Cleanser: Soap and Water 3 x Per DMVE/72Days Discharge Instructions: May shower and wash wound with dial antibacterial soap and water prior to dressing change. Peri-Wound  Care: Zinc Oxide Ointment 30g tube 3 x Per Day/15 Days Discharge  Instructions: As needed Apply Zinc Oxide to periwound with each dressing change Prim Dressing: KerraCel Ag Gelling Fiber Dressing, 4x5 in (silver alginate) 3 x Per Day/15 Days ary Discharge Instructions: In clinic and patient to continue to use until interdry arrives. Apply silver alginate to wound bed as instructed Secondary Dressing: Woven Gauze Sponge, Non-Sterile 4x4 in (Generic) 3 x Per Day/15 Days Discharge Instructions: Apply over primary dressing as directed. Secondary Dressing: ABD Pad, 5x9 (Generic) 3 x Per Day/15 Days Discharge Instructions: Apply over primary dressing as directed. Secured With: 64M Medipore H Soft Cloth Surgical T 4 x 2 (in/yd) (Generic) 3 x Per Day/15 Days ape Discharge Instructions: Secure dressing with tape as directed. Wound #11 - Upper Leg Wound Laterality: Left, Medial Cleanser: Soap and Water 3 x Per Day/15 Days Discharge Instructions: May shower and wash wound with dial antibacterial soap and water prior to dressing change. Peri-Wound Care: Zinc Oxide Ointment 30g tube 3 x Per Day/15 Days Discharge Instructions: As needed Apply Zinc Oxide to periwound with each dressing change Prim Dressing: KerraCel Ag Gelling Fiber Dressing, 4x5 in (silver alginate) 3 x Per Day/15 Days ary Discharge Instructions: In clinic and patient to continue to use until interdry arrives. Apply silver alginate to wound bed as instructed Secondary Dressing: Woven Gauze Sponge, Non-Sterile 4x4 in (Generic) 3 x Per Day/15 Days Discharge Instructions: Apply over primary dressing as directed. Secondary Dressing: ABD Pad, 5x9 (Generic) 3 x Per Day/15 Days Discharge Instructions: Apply over primary dressing as directed. Secured With: 64M Medipore H Soft Cloth Surgical T 4 x 2 (in/yd) (Generic) 3 x Per Day/15 Days ape Discharge Instructions: Secure dressing with tape as directed. Wound #6 - Upper Leg Wound Laterality:  Right Cleanser: Soap and Water 3 x Per GGE/36 Days Discharge Instructions: May shower and wash wound with dial antibacterial soap and water prior to dressing change. Peri-Wound Care: Zinc Oxide Ointment 30g tube 3 x Per Day/15 Days Discharge Instructions: As needed Apply Zinc Oxide to periwound with each dressing change Prim Dressing: KerraCel Ag Gelling Fiber Dressing, 4x5 in (silver alginate) 3 x Per Day/15 Days ary Discharge Instructions: In clinic and patient to continue to use until interdry arrives. Apply silver alginate to wound bed as instructed Secondary Dressing: Woven Gauze Sponge, Non-Sterile 4x4 in (Generic) 3 x Per Day/15 Days Discharge Instructions: Apply over primary dressing as directed. Secondary Dressing: ABD Pad, 5x9 (Generic) 3 x Per Day/15 Days Discharge Instructions: Apply over primary dressing as directed. Secured With: 64M Medipore H Soft Cloth Surgical T 4 x 2 (in/yd) (Generic) 3 x Per Day/15 Days ape Discharge Instructions: Secure dressing with tape as directed. Wound #8 - Upper Leg Wound Laterality: Left, Lateral Cleanser: Soap and Water 3 x Per Day/15 Days Discharge Instructions: May shower and wash wound with dial antibacterial soap and water prior to dressing change. Cleanser: Wound Cleanser 3 x Per Day/15 Days Discharge Instructions: Cleanse the wound with wound cleanser prior to applying a clean dressing using gauze sponges, not tissue or cotton balls. Prim Dressing: Wet-to-dry 3 x Per OQH/47 Days ary Discharge Instructions: wet to dry in clinic. Wound vac three times a week. Secondary Dressing: Woven Gauze Sponges 2x2 in (Generic) 3 x Per Day/15 Days Discharge Instructions: Apply over primary dressing as directed. Secondary Dressing: ABD Pad, 5x9 (Generic) 3 x Per Day/15 Days Discharge Instructions: Apply over primary dressing as directed. Secured With: 64M Medipore H Soft Cloth Surgical T ape, 4 x 10 (in/yd) (Generic) 3 x Per Day/15 Days  Discharge  Instructions: Secure with tape as directed. Wound #9 - Upper Leg Wound Laterality: Right, Proximal Cleanser: Soap and Water 3 x Per ZOX/09 Days Discharge Instructions: May shower and wash wound with dial antibacterial soap and water prior to dressing change. Peri-Wound Care: Zinc Oxide Ointment 30g tube 3 x Per Day/15 Days Discharge Instructions: As needed Apply Zinc Oxide to periwound with each dressing change Prim Dressing: KerraCel Ag Gelling Fiber Dressing, 4x5 in (silver alginate) 3 x Per Day/15 Days ary Discharge Instructions: In clinic and patient to continue to use until interdry arrives. Apply silver alginate to wound bed as instructed Secondary Dressing: Woven Gauze Sponge, Non-Sterile 4x4 in (Generic) 3 x Per Day/15 Days Discharge Instructions: Apply over primary dressing as directed. Secondary Dressing: ABD Pad, 5x9 (Generic) 3 x Per Day/15 Days Discharge Instructions: Apply over primary dressing as directed. Secured With: 17M Medipore H Soft Cloth Surgical T 4 x 2 (in/yd) (Generic) 3 x Per Day/15 Days ape Discharge Instructions: Secure dressing with tape as directed. Patient Medications llergies: Iodinated Contrast- Oral and IV Dye A Notifications Medication Indication Start End 09/28/2021 Hibiclens DOSE 1 - topical 4 % liquid - apply chlorhexidine daily to cover skin or wound area and wash gently. 09/28/2021 doxycycline hyclate DOSE 1 - oral 100 mg tablet - 1 tablet oral BID x 7 days Electronic Signature(s) Signed: 09/28/2021 12:26:16 PM By: Kalman Shan DO Previous Signature: 09/28/2021 12:16:28 PM Version By: Kalman Shan DO Entered By: Kalman Shan on 09/28/2021 12:16:51 -------------------------------------------------------------------------------- Problem List Details Patient Name: Date of Service: [name], [name] 09/28/2021 10:45 A M Medical Record Number: [id number] Patient Account Number: [id number] Date of Birth/Sex: Treating  RN: 1950/11/22 (70 y.o. Benjamine Sprague, Briant Cedar Primary Care Provider: [name] Other Clinician: Referring Provider: Treating Provider/Extender: Alysia Penna in Treatment: 21 Active Problems ICD-10 Encounter Code Description Active Date MDM Diagnosis L97.829 Non-pressure chronic ulcer of other part of left lower leg with unspecified 04/30/2021 No Yes severity L97.819 Non-pressure chronic ulcer of other part of right lower leg with unspecified 04/30/2021 No Yes severity E11.9 Type 2 diabetes mellitus without complications 1/91/4782 No Yes Z89.512 Acquired absence of left leg below knee 04/30/2021 No Yes S31.109D Unspecified open wound of abdominal wall, unspecified quadrant without 09/28/2021 No Yes penetration into peritoneal cavity, subsequent encounter Inactive Problems Resolved Problems Electronic Signature(s) Signed: 09/28/2021 12:26:16 PM By: Kalman Shan DO Entered By: Kalman Shan on 09/28/2021 12:24:50 -------------------------------------------------------------------------------- Progress Note Details Patient Name: Date of Service: [name], [name] RET D. 09/28/2021 10:45 A M Medical Record Number: [id number] Patient Account Number: [id number] Date of Birth/Sex: Treating RN: 08-26-1951 (70 y.o. [name] Primary Care Provider: [name] Other Clinician: Referring Provider: Treating Provider/Extender: Alysia Penna in Treatment: 21 Subjective Chief Complaint Information obtained from Patient Left lower extremity wounds and right upper leg wound, Pannus wounds History of Present Illness (HPI) Admission 04/30/2021 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 She has been seen in our clinic multiple times last seen in 03/2019 for right toe wounds. Today she presents  for 3 wounds. 1 is located to the right upper leg and 2 to the left lower extremity. They started at the end of December And not sure how I started. She had not been using any dressings up until 2 weeks ago. She is currently using silver alginate  to the right upper leg and Santyl to the wounds on the left leg. She has been on doxycycline and 2 rounds of clindamycin for cellulitis to these wounds. She finished her last round of antibiotics 3 weeks ago. She reports minimal pain to the wound. She currently denies signs of infection including increased erythema, warmth or purulent drainage. 7/5; patient presents for 1 week follow-up. She has been using Santyl to the left lower extremity wounds and silver alginate to the right upper leg wound. She denies signs of infection. She has no complaints today. 7/19; patient presents for 1 week follow-up. She has been using Santyl to the left lower extremity wounds and silver alginate to the right upper leg wound. She denies signs of infection. She has had to use a lot of Santyl to the lateral wound since the necrotic tissue has been breaking down and the wound is deeper. 8/4; patient presents for 2-week follow-up. She has been using Santyl to the left lower extremity wound and silver alginate to the right upper leg wound. She has been using the wound VAC to the left lateral wound. She has no issues or complaints today. She denies signs of infection. 8/18; patient presents for 2-week follow-up. She has been using Santyl and silver alginate to the right upper wound. She reports that this is deeper today. She has been using the wound VAC to the left lateral wound. She has no issues or complaints today. She denies signs of infection. She reports that the left lower extremity medial wound is healed 9/1; patient presents for 2-week follow-up. She continues to use silver alginate to the right upper wound. She is using the wound VAC to the left lateral wound. She has no  issues today. She denies signs of infection. She overall feels well. 10/10; patient presents for follow-up. Patient has not followed up in a month. She states that her husband was recently in the ICU but is now at home. She reports an increase in the wound size to the right lower extremity. She reports improvement to the left lower extremity wound. She currently denies signs of infection. She states she overall feels well. 10/24; patient presents for follow-up. She has a new wound to the right upper thigh. She has been keeping the area covered until yesterday when she started silver alginate. She continues to use Dakin wet-to-dry dressings to the other right upper leg wound. She continues to use the [facility] to the left lower extremity wound. No obvious signs of infection on exam. 11/7; patient presents for follow-up. She again has new wounds to her pannus folds. She is keeping the area dry with absorbent pads and using silver alginate to the wound beds. She continues to use the wound VAC to the left lower extremity wound without issues. She denies signs of infection. 11/21; patient presents for follow-up. She continues to have new wounds to her pannus folds. Her daughter helps with dressing changes and is keeping the areas dry with absorbent pads and using silver alginate to the wound beds. She continues to use the wound VAC to the left lower extremity wound without issues. She currently denies signs of infection. Patient History Information obtained from Patient. Family History Cancer - Child, Diabetes - Father, Heart Disease - Mother,Father, Hypertension - Mother,Father, No family history of Hereditary Spherocytosis, Kidney Disease, Lung Disease, Seizures, Stroke, Thyroid Problems, Tuberculosis. Social History Never smoker, Marital Status - Married, Alcohol Use - Never, Drug Use - No History, Caffeine Use - Daily -  coffee , sodas. Medical History Eyes Patient has history of Cataracts - lens  implant done, Glaucoma Denies history of Optic Neuritis Ear/Nose/Mouth/Throat Patient has history of Chronic sinus problems/congestion - seasonal Denies history of Middle ear problems Hematologic/Lymphatic Denies history of Anemia, Hemophilia, Human Immunodeficiency Virus, Lymphedema, Sickle Cell Disease Respiratory Patient has history of Asthma Denies history of Aspiration, Chronic Obstructive Pulmonary Disease (COPD), Pneumothorax, Sleep Apnea, Tuberculosis Cardiovascular Patient has history of Hypertension - not on meds presently Denies history of Angina, Arrhythmia, Congestive Heart Failure, Coronary Artery Disease, Deep Vein Thrombosis, Hypotension, Myocardial Infarction, Peripheral Arterial Disease, Peripheral Venous Disease, Phlebitis, Vasculitis Gastrointestinal Denies history of Cirrhosis , Colitis, Crohnoos, Hepatitis A, Hepatitis B, Hepatitis C Endocrine Patient has history of Type II Diabetes Denies history of Type I Diabetes Genitourinary Denies history of End Stage Renal Disease Immunological Denies history of Lupus Erythematosus, Raynaudoos, Scleroderma Integumentary (Skin) Denies history of History of Burn Musculoskeletal Patient has history of Osteoarthritis Denies history of Gout, Rheumatoid Arthritis, Osteomyelitis Neurologic Patient has history of Neuropathy Denies history of Dementia, Quadriplegia, Paraplegia, Seizure Disorder Oncologic Denies history of Received Chemotherapy, Received Radiation Psychiatric Denies history of Anorexia/bulimia, Confinement Anxiety Medical A Surgical History Notes nd Cardiovascular Hyperlipidemia Gastrointestinal GERD, Diverticulitis Genitourinary Overactive Bladder Musculoskeletal Fibromyalgia, toe amputations x 3 Psychiatric Anxiety Objective Constitutional respirations regular, non-labored and within target range for patient.. Vitals Time Taken: 10:29 AM, Height: 63 in, Weight: 279 lbs, BMI: 49.4, Temperature:  98.2 F, Pulse: 74 bpm, Respiratory Rate: 18 breaths/min, Blood Pressure: 144/74 mmHg, Capillary Blood Glucose: 131 mg/dl. General Notes: glucose per pt report Psychiatric pleasant and cooperative. General Notes: Left lower extremity: Large open wound with granulation tissue and three areas of tunneling. No obvious signs of infection. Overall appears well-healing. Several wounds to the pannus folds and thigh folds. She has a few areas of folliculitis to these folds. Integumentary (Hair, Skin) Wound #10 status is Open. Original cause of wound was Gradually Appeared. The date acquired was: 09/14/2021. The wound has been in treatment 2 weeks. The wound is located on the Right Abdomen - Lower Quadrant. The wound measures 0.9cm length x 0.8cm width x 0.1cm depth; 0.565cm^2 area and 0.057cm^3 volume. There is Fat Layer (Subcutaneous Tissue) exposed. There is no tunneling noted, however, there is undermining starting at 9:00 and ending at 12:00 with a maximum distance of 0.4cm. There is a medium amount of serosanguineous drainage noted. The wound margin is distinct with the outline attached to the wound base. There is large (67-100%) red, pink granulation within the wound bed. There is no necrotic tissue within the wound bed. Wound #11 status is Open. Original cause of wound was Gradually Appeared. The date acquired was: 09/14/2021. The wound has been in treatment 2 weeks. The wound is located on the Left,Medial Upper Leg. The wound measures 0.4cm length x 0.8cm width x 0.2cm depth; 0.251cm^2 area and 0.05cm^3 volume. There is Fat Layer (Subcutaneous Tissue) exposed. There is no tunneling or undermining noted. There is a medium amount of serosanguineous drainage noted. The wound margin is distinct with the outline attached to the wound base. There is small (1-33%) pink, pale granulation within the wound bed. There is a large (67- 100%) amount of necrotic tissue within the wound bed including Adherent  Slough. Wound #6 status is Open. Original cause of wound was Gradually Appeared. The date acquired was: 11/06/2020. The wound has been in treatment 21 weeks. The wound is located on the Right Upper Leg. The wound measures 1cm length x  1cm width x 1cm depth; 0.785cm^2 area and 0.785cm^3 volume. There is Fat Layer (Subcutaneous Tissue) exposed. There is no tunneling or undermining noted. There is a medium amount of serous drainage noted. The wound margin is distinct with the outline attached to the wound base. There is large (67-100%) pink, pale, friable granulation within the wound bed. There is no necrotic tissue within the wound bed. Wound #8 status is Open. Original cause of wound was Gradually Appeared. The date acquired was: 01/06/2021. The wound has been in treatment 21 weeks. The wound is located on the Left,Lateral Upper Leg. The wound measures 12cm length x 6.4cm width x 5.5cm depth; 60.319cm^2 area and 331.752cm^3 volume. There is muscle and Fat Layer (Subcutaneous Tissue) exposed. There is no undermining noted, however, there is tunneling at 9:00 with a maximum distance of 5.8cm. There is additional tunneling and at 12:00 with a maximum distance of 4cm. There is a large amount of serosanguineous drainage noted. Foul odor after cleansing was noted. Foul odor after cleansing was noted due to product use. The wound margin is distinct with the outline attached to the wound base. There is large (67-100%) red, friable granulation within the wound bed. There is a small (1-33%) amount of necrotic tissue within the wound bed including Adherent Slough. Wound #9 status is Open. Original cause of wound was Other Lesion. The date acquired was: 08/19/2021. The wound has been in treatment 4 weeks. The wound is located on the Right,Proximal Upper Leg. The wound measures 1cm length x 2.8cm width x 0.1cm depth; 2.199cm^2 area and 0.22cm^3 volume. There is Fat Layer (Subcutaneous Tissue) exposed. There is no  tunneling or undermining noted. There is a medium amount of serosanguineous drainage noted. The wound margin is distinct with the outline attached to the wound base. There is large (67-100%) pink granulation within the wound bed. There is no necrotic tissue within the wound bed. 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 Recommended continuing the wound VAC. We did discuss the potential of doing an OR debridement with plastic surgery since the wound has been at a standstill for the past several visits. Patient would like to think about this. Patient continues to have wounds to her pannus and thigh folds. Her daughter is doing a great job of keeping these clean. She keeps having areas pop up a with folliculitis creating wounds. I will give her doxycycline for this and Hibiclens. Follow-up in 2 weeks Plan Follow-up Appointments: Return Appointment in 2 weeks. - Dr. Heber Wadsworth - ***STRETCHER - 60 minutes extra time*** Bathing/ Shower/ Hygiene: Other Bathing/Shower/Hygiene Orders/Instructions: - You may sponge bathe Negative Presssure Wound Therapy: Wound #8 Left,Lateral Upper Leg: Wound Vac to wound continuously at 134m/hg pressure - Change three times a week. Black Foam White Foam - White foam to deep areas at 7:00, 9:00, and 11:00 Off-Loading: Turn and reposition every 2 hours Additional Orders / Instructions: Other: - interdry for skin folds. The following medication(s) was prescribed: Hibiclens topical 4 % liquid 1 apply chlorhexidine daily to cover skin or wound area  and wash gently. starting 09/28/2021 doxycycline hyclate oral 100 mg tablet 1 1 tablet oral BID x 7 days  starting 09/28/2021 WOUND #10: - Abdomen - Lower Quadrant Wound Laterality: Right Cleanser: Soap and Water 3 x Per CBS/49 Days Discharge Instructions: May shower and wash wound with dial antibacterial soap and water prior to dressing change. Peri-Wound Care: Zinc Oxide Ointment 30g tube 3 x Per Day/15 Days Discharge Instructions: As needed Apply Zinc Oxide to periwound with each dressing change Prim Dressing: KerraCel Ag Gelling Fiber Dressing, 4x5 in (silver alginate) 3 x Per Day/15 Days ary Discharge Instructions: In clinic and patient to continue to use until interdry arrives. Apply silver alginate to wound bed as instructed Secondary Dressing: Woven Gauze Sponge, Non-Sterile 4x4 in (Generic) 3 x Per Day/15 Days Discharge Instructions: Apply over primary dressing as directed. Secondary Dressing: ABD Pad, 5x9 (Generic) 3 x Per Day/15 Days Discharge Instructions: Apply over primary dressing as directed. Secured With: 618M Medipore H Soft Cloth Surgical T 4 x 2 (in/yd) (Generic) 3 x Per Day/15 Days ape Discharge Instructions: Secure dressing with tape as directed. WOUND #11: - Upper Leg Wound Laterality: Left, Medial Cleanser: Soap and Water 3 x Per Day/15 Days Discharge Instructions: May shower and wash wound with dial antibacterial soap and water prior to dressing change. Peri-Wound Care: Zinc Oxide Ointment 30g tube 3 x Per Day/15 Days Discharge Instructions: As needed Apply Zinc Oxide to periwound with each dressing change Prim Dressing: KerraCel Ag Gelling Fiber Dressing, 4x5 in (silver alginate) 3 x Per Day/15 Days ary Discharge Instructions: In clinic and patient to continue to use until interdry arrives. Apply silver alginate to wound bed as instructed Secondary Dressing: Woven Gauze Sponge, Non-Sterile 4x4 in (Generic) 3 x Per Day/15 Days Discharge Instructions: Apply over  primary dressing as directed. Secondary Dressing: ABD Pad, 5x9 (Generic) 3 x Per Day/15 Days Discharge Instructions: Apply over primary dressing as directed. Secured With: 618M Medipore H Soft Cloth Surgical T 4 x 2 (in/yd) (Generic) 3 x Per Day/15 Days ape Discharge Instructions: Secure dressing with tape as directed. WOUND #6: - Upper Leg Wound Laterality: Right Cleanser: Soap and Water 3 x Per QPR/91 Days Discharge Instructions: May shower and wash wound with dial antibacterial soap and water prior to dressing change. Peri-Wound Care: Zinc Oxide Ointment 30g tube 3 x Per Day/15 Days Discharge Instructions: As needed Apply Zinc Oxide to periwound with each dressing change Prim Dressing: KerraCel Ag Gelling Fiber Dressing, 4x5 in (silver alginate) 3 x Per Day/15 Days ary Discharge Instructions: In clinic and patient to continue to use until interdry arrives. Apply silver alginate to wound bed as instructed Secondary Dressing: Woven Gauze Sponge, Non-Sterile 4x4 in (Generic) 3 x Per Day/15 Days Discharge Instructions: Apply over primary dressing as directed. Secondary Dressing: ABD Pad, 5x9 (Generic) 3 x Per Day/15 Days Discharge Instructions: Apply over primary dressing as directed. Secured With: 618M Medipore H Soft Cloth Surgical T 4 x 2 (in/yd) (Generic) 3 x Per Day/15 Days ape Discharge Instructions: Secure dressing with tape as directed. WOUND #8: - Upper Leg Wound Laterality: Left, Lateral Cleanser: Soap and Water 3 x Per Day/15 Days Discharge Instructions: May shower and wash wound with dial antibacterial soap and water prior to dressing change. Cleanser: Wound Cleanser 3 x Per Day/15 Days Discharge Instructions: Cleanse the wound with wound cleanser prior to applying a clean dressing using gauze sponges, not tissue or cotton balls. Prim Dressing: Wet-to-dry 3 x Per MBW/46 Days ary Discharge Instructions: wet to dry in clinic. Wound vac three times a week. Secondary Dressing: Woven  Gauze Sponges 2x2 in (Generic)  3 x Per Day/15 Days Discharge Instructions: Apply over primary dressing as directed. Secondary Dressing: ABD Pad, 5x9 (Generic) 3 x Per Day/15 Days Discharge Instructions: Apply over primary dressing as directed. Secured With: 24M Medipore H Soft Cloth Surgical T ape, 4 x 10 (in/yd) (Generic) 3 x Per Day/15 Days Discharge Instructions: Secure with tape as directed. WOUND #9: - Upper Leg Wound Laterality: Right, Proximal Cleanser: Soap and Water 3 x Per Day/15 Days Discharge Instructions: May shower and wash wound with dial antibacterial soap and water prior to dressing change. Peri-Wound Care: Zinc Oxide Ointment 30g tube 3 x Per Day/15 Days Discharge Instructions: As needed Apply Zinc Oxide to periwound with each dressing change Prim Dressing: KerraCel Ag Gelling Fiber Dressing, 4x5 in (silver alginate) 3 x Per Day/15 Days ary Discharge Instructions: In clinic and patient to continue to use until interdry arrives. Apply silver alginate to wound bed as instructed Secondary Dressing: Woven Gauze Sponge, Non-Sterile 4x4 in (Generic) 3 x Per Day/15 Days Discharge Instructions: Apply over primary dressing as directed. Secondary Dressing: ABD Pad, 5x9 (Generic) 3 x Per Day/15 Days Discharge Instructions: Apply over primary dressing as directed. Secured With: 24M Medipore H Soft Cloth Surgical T 4 x 2 (in/yd) (Generic) 3 x Per Day/15 Days ape Discharge Instructions: Secure dressing with tape as directed. 1. Wound VAC 2. Silver alginate 3. Doxycycline and Hibiclens 4. Follow-up in 2 weeks Electronic Signature(s) Signed: 09/28/2021 12:26:16 PM By: Kalman Shan DO Entered By: Kalman Shan on 09/28/2021 12:25:10 -------------------------------------------------------------------------------- HxROS Details Patient Name: Date of Service: [name], [name] 09/28/2021 10:45 A M Medical Record Number: [id number] Patient Account Number: [id number] Date of  Birth/Sex: Treating RN: 22-Dec-1950 (70 y.o. [name] Primary Care Provider: [name] Other Clinician: Referring Provider: Treating Provider/Extender: Alysia Penna in Treatment: 21 Information Obtained From Patient Eyes Medical History: Positive for: Cataracts - lens implant done; Glaucoma Negative for: Optic Neuritis Ear/Nose/Mouth/Throat Medical History: Positive for: Chronic sinus problems/congestion - seasonal Negative for: Middle ear problems Hematologic/Lymphatic Medical History: Negative for: Anemia; Hemophilia; Human Immunodeficiency Virus; Lymphedema; Sickle Cell Disease Respiratory Medical History: Positive for: Asthma Negative for: Aspiration; Chronic Obstructive Pulmonary Disease (COPD); Pneumothorax; Sleep Apnea; Tuberculosis Cardiovascular Medical History: Positive for: Hypertension - not on meds presently Negative for: Angina; Arrhythmia; Congestive Heart Failure; Coronary Artery Disease; Deep Vein Thrombosis; Hypotension; Myocardial Infarction; Peripheral Arterial Disease; Peripheral Venous Disease; Phlebitis; Vasculitis Past Medical History Notes: Hyperlipidemia Gastrointestinal Medical History: Negative for: Cirrhosis ; Colitis; Crohns; Hepatitis A; Hepatitis B; Hepatitis C Past Medical History Notes: GERD, Diverticulitis Endocrine Medical History: Positive for: Type II Diabetes Negative for: Type I Diabetes Time with diabetes: 12 years Treated with: Insulin Blood sugar tested every day: Yes T ested : bid Blood sugar testing results: Breakfast: 140 max; Bedtime: 220's max Genitourinary Medical History: Negative for: End Stage Renal Disease Past Medical History Notes: Overactive Bladder Immunological Medical History: Negative for: Lupus Erythematosus; Raynauds; Scleroderma Integumentary (Skin) Medical History: Negative for: History of Burn Musculoskeletal Medical History: Positive for:  Osteoarthritis Negative for: Gout; Rheumatoid Arthritis; Osteomyelitis Past Medical History Notes: Fibromyalgia, toe amputations x 3 Neurologic Medical History: Positive for: Neuropathy Negative for: Dementia; Quadriplegia; Paraplegia; Seizure Disorder Oncologic Medical History: Negative for: Received Chemotherapy; Received Radiation Psychiatric Medical History: Negative for: Anorexia/bulimia; Confinement Anxiety Past Medical History Notes: Anxiety HBO Extended History Items Ear/Nose/Mouth/Throat: Eyes: Eyes: Chronic sinus Cataracts Glaucoma problems/congestion Immunizations Pneumococcal Vaccine: Received Pneumococcal Vaccination: Yes Received Pneumococcal Vaccination On or After 60th  Birthday: No Immunization Notes: up to date on pneumonia and tetanus shots Implantable Devices None Family and Social History Cancer: Yes - Child; Diabetes: Yes - Father; Heart Disease: Yes - Mother,Father; Hereditary Spherocytosis: No; Hypertension: Yes - Mother,Father; Kidney Disease: No; Lung Disease: No; Seizures: No; Stroke: No; Thyroid Problems: No; Tuberculosis: No; Never smoker; Marital Status - Married; Alcohol Use: Never; Drug Use: No History; Caffeine Use: Daily - coffee , sodas; Financial Concerns: No; Food, Clothing or Shelter Needs: No; Support System Lacking: No; Transportation Concerns: No Electronic Signature(s) Signed: 09/28/2021 12:26:16 PM By: Kalman Shan DO Signed: 09/28/2021 4:56:39 PM By: Lorrin Jackson Entered By: Kalman Shan on 09/28/2021 11:59:23 -------------------------------------------------------------------------------- SuperBill Details Patient Name: Date of Service: [name], [name] 09/28/2021 Medical Record Number: [id number] Patient Account Number: [id number] Date of Birth/Sex: Treating RN: 11/21/1950 (70 y.o. Nancy Fetter Primary Care Provider: [name] Other Clinician: Referring Provider: Treating Provider/Extender:  Alysia Penna in Treatment: 21 Diagnosis Coding ICD-10 Codes Code Description [phone] 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ity L97.819 Non-pressure chronic ulcer of other part of right lower leg with unspecified severity E11.9 Type 2 diabetes mellitus without complications X83.338 Acquired absence of left leg below knee S31.109D Unspecified open wound of abdominal wall, unspecified quadrant without penetration into peritoneal cavity, subsequent encounter Facility Procedures CPT4 Code: [id number] Description: [phone] - WOUND CARE VISIT-LEV 5 EST PT Modifier: Quantity: 1 Physician Procedures : CPT4 Code Description Modifier [id number] - WC PHYS LEVEL 4 - EST PT ICD-10 Diagnosis Description 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ity L97.819 Non-pressure chronic ulcer of other part of right  lower leg with unspecified severity S31.109D Unspecified open wound of abdominal wall, unspecified quadrant without penetration into peritoneal cav subsequent encounter Z89.512 Acquired absence of left leg below knee Quantity: 1 ity, Electronic Signature(s) Signed: 09/28/2021 12:26:16 PM By: Kalman Shan DO Entered By: Kalman Shan on 09/28/2021 12:25:49

## 2021-09-28 NOTE — Progress Notes (Signed)
CORNELL, GABER ([id number]) Visit Report for 09/28/2021 Arrival Information Details Patient Name: Date of Service: [name], [name] 09/28/2021 10:45 A M Medical Record Number: [id number] Patient Account Number: [id number] Date of Birth/Sex: Treating RN: 09-May-1951 (70 y.o. Nancy Fetter Primary Care Yeiden Frenkel: [name] Other Clinician: Referring Latamara Melder: Treating Eleftherios Dudenhoeffer/Extender: Alysia Penna in Treatment: 21 Visit Information History Since Last Visit Added or deleted any medications: No Patient Arrived: Stretcher Any new allergies or adverse reactions: No Arrival Time: 10:29 Had a fall or experienced change in No Accompanied By: daughter activities of daily living that may affect Transfer Assistance: Stretcher risk of falls: Patient Identification Verified: Yes Signs or symptoms of abuse/neglect since last visito No Secondary Verification Process Completed: Yes Hospitalized since last visit: No Patient Requires Transmission-Based Precautions: No Implantable device outside of the clinic excluding No Patient Has Alerts: No cellular tissue based products placed in the center since last visit: Has Dressing in Place as Prescribed: Yes Pain Present Now: Yes Electronic Signature(s) Signed: 09/28/2021 5:11:19 PM By: Levan Hurst RN, BSN Entered By: Levan Hurst on 09/28/2021 10:30:08 -------------------------------------------------------------------------------- Clinic Level of Care Assessment Details Patient Name: Date of Service: [place] RTO N, [name] 09/28/2021 10:45 A M Medical Record Number: [id number] Patient Account Number: [id number] Date of Birth/Sex: Treating RN: 09-Jan-1951 (70 y.o. Nancy Fetter Primary Care Kayin Kettering: [name] Other Clinician: Referring Dorette Hartel: Treating Brylin Stopper/Extender: Alysia Penna in Treatment: 21 Clinic Level of Care Assessment Items TOOL 4 Quantity  Score X- 1 0 Use when only an EandM is performed on FOLLOW-UP visit ASSESSMENTS - Nursing Assessment / Reassessment X- 1 10 Reassessment of Co-morbidities (includes updates in patient status) X- 1 5 Reassessment of Adherence to Treatment Plan ASSESSMENTS - Wound and Skin A ssessment / Reassessment []  - 0 Simple Wound Assessment / Reassessment - one wound X- 5 5 Complex Wound Assessment / Reassessment - multiple wounds []  - 0 Dermatologic / Skin Assessment (not related to wound area) ASSESSMENTS - Focused Assessment []  - 0 Circumferential Edema Measurements - multi extremities []  - 0 Nutritional Assessment / Counseling / Intervention []  - 0 Lower Extremity Assessment (monofilament, tuning fork, pulses) []  - 0 Peripheral Arterial Disease Assessment (using hand held doppler) ASSESSMENTS - Ostomy and/or Continence Assessment and Care []  - 0 Incontinence Assessment and Management []  - 0 Ostomy Care Assessment and Management (repouching, etc.) PROCESS - Coordination of Care X - Simple Patient / Family Education for ongoing care 1 15 []  - 0 Complex (extensive) Patient / Family Education for ongoing care X- 1 10 Staff obtains Programmer, systems, Records, T Results / Process Orders est []  - 0 Staff telephones HHA, Nursing Homes / Clarify orders / etc []  - 0 Routine Transfer to another Facility (non-emergent condition) []  - 0 Routine Hospital Admission (non-emergent condition) []  - 0 New Admissions / Biomedical engineer / Ordering NPWT Apligraf, etc. , []  - 0 Emergency Hospital Admission (emergent condition) X- 1 10 Simple Discharge Coordination []  - 0 Complex (extensive) Discharge Coordination PROCESS - Special Needs []  - 0 Pediatric / Minor Patient Management []  - 0 Isolation Patient Management []  - 0 Hearing / Language / Visual special needs []  - 0 Assessment of Community assistance (transportation, D/C planning, etc.) []  - 0 Additional assistance / Altered  mentation []  - 0 Support Surface(s) Assessment (bed, cushion, seat, etc.) INTERVENTIONS - Wound Cleansing / Measurement []  - 0 Simple Wound Cleansing - one wound X- 5 5  Complex Wound Cleansing - multiple wounds X- 1 5 Wound Imaging (photographs - any number of wounds) []  - 0 Wound Tracing (instead of photographs) []  - 0 Simple Wound Measurement - one wound X- 5 5 Complex Wound Measurement - multiple wounds INTERVENTIONS - Wound Dressings X - Small Wound Dressing one or multiple wounds 4 10 []  - 0 Medium Wound Dressing one or multiple wounds X- 1 20 Large Wound Dressing one or multiple wounds []  - 0 Application of Medications - topical []  - 0 Application of Medications - injection INTERVENTIONS - Miscellaneous []  - 0 External ear exam []  - 0 Specimen Collection (cultures, biopsies, blood, body fluids, etc.) []  - 0 Specimen(s) / Culture(s) sent or taken to Lab for analysis []  - 0 Patient Transfer (multiple staff / Civil Service fast streamer / Similar devices) []  - 0 Simple Staple / Suture removal (25 or less) []  - 0 Complex Staple / Suture removal (26 or more) []  - 0 Hypo / Hyperglycemic Management (close monitor of Blood Glucose) []  - 0 Ankle / Brachial Index (ABI) - do not check if billed separately X- 1 5 Vital Signs Has the patient been seen at the hospital within the last three years: Yes Total Score: 195 Level Of Care: New/Established - Level 5 Electronic Signature(s) Signed: 09/28/2021 5:11:19 PM By: Levan Hurst RN, BSN Entered By: Levan Hurst on 09/28/2021 11:40:22 -------------------------------------------------------------------------------- Encounter Discharge Information Details Patient Name: Date of Service: [name], [name] 09/28/2021 10:45 A M Medical Record Number: [id number] Patient Account Number: [id number] Date of Birth/Sex: Treating RN: 18-Apr-1951 (70 y.o. Nancy Fetter Primary Care Rilea Arutyunyan: [name] Other Clinician: Referring  Rainey Kahrs: Treating Aleigh Grunden/Extender: Alysia Penna in Treatment: 21 Encounter Discharge Information Items Discharge Condition: Stable Ambulatory Status: Stretcher Discharge Destination: Home Transportation: Private Auto Accompanied By: daughter Schedule Follow-up Appointment: Yes Clinical Summary of Care: Patient Declined Electronic Signature(s) Signed: 09/28/2021 5:11:19 PM By: Levan Hurst RN, BSN Entered By: Levan Hurst on 09/28/2021 11:41:15 -------------------------------------------------------------------------------- Multi Wound Chart Details Patient Name: Date of Service: [name], [name] 09/28/2021 10:45 A M Medical Record Number: [id number] Patient Account Number: [id number] Date of Birth/Sex: Treating RN: [birth year] (70 y.o. [name] Primary Care Grazia Taffe: [name] Other Clinician: Referring Kenshawn Maciolek: Treating Ellamae Lybeck/Extender: Alysia Penna in Treatment: 21 Vital Signs Height(in): 63 Capillary Blood Glucose(mg/dl): 131 Weight(lbs): 279 Pulse(bpm): 74 Body Mass Index(BMI): 19 Blood Pressure(mmHg): 144/74 Temperature(F): 98.2 Respiratory Rate(breaths/min): 18 Photos: [10:Right Abdomen - Lower Quadrant] [11:Left, Medial Upper Leg] [6:Right Upper Leg] Wound Location: [10:Gradually Appeared] [11:Gradually Appeared] [6:Gradually Appeared] Wounding Event: [10:Pressure Ulcer] [11:Pressure Ulcer] [6:Diabetic Wound/Ulcer of the Lower] Primary Etiology: [10:Cataracts, Glaucoma, Chronic sinus] [11:Cataracts, Glaucoma, Chronic sinus] [6:Extremity Cataracts, Glaucoma, Chronic sinus] Comorbid History: [10:problems/congestion, Asthma, Hypertension, Type II Diabetes, Osteoarthritis, Neuropathy 09/14/2021] [11:problems/congestion, Asthma, Hypertension, Type II Diabetes, Osteoarthritis, Neuropathy 09/14/2021] [6:problems/congestion,  Asthma, Hypertension, Type II Diabetes, Osteoarthritis, Neuropathy  11/06/2020] Date Acquired: [10:2] [11:2] [6:21] Weeks of Treatment: [10:Open] [11:Open] [6:Open] Wound Status: [10:0.9x0.8x0.1] [11:0.4x0.8x0.2] [6:1x1x1] Measurements L x W x D (cm) [10:0.565] [11:0.251] [6:0.785] A (cm) : rea [10:0.057] [11:0.05] [6:0.785] Volume (cm) : [10:-28.40%] [11:-112.70%] [6:97.20%] % Reduction in A rea: [10:35.20%] [11:-108.30%] [6:97.20%] % Reduction in Volume: [10:9] Starting Position 1 (o'clock): [10:12] Ending Position 1 (o'clock): [10:0.4] Maximum Distance 1 (cm): [10:No] [11:No] [6:No] Tunneling: [10:Yes] [11:No] [6:No] Undermining: [10:Category/Stage III] [11:Category/Stage II] [6:Grade 2] Classification: [10:Medium] [11:Medium] [6:Medium] Exudate A mount: [10:Serosanguineous] [11:Serosanguineous] [6:Serous] Exudate Type: [10:red, brown] [11:red, brown] [  6:amber] Exudate Color: [10:No] [11:No] [6:No] Foul Odor A Cleansing: [10:fter N/A] [11:N/A] [6:N/A] Odor A nticipated Due to Product Use: [10:Distinct, outline attached] [11:Distinct, outline attached] [6:Distinct, outline attached] Wound Margin: [10:Large (67-100%)] [11:Small (1-33%)] [6:Large (67-100%)] Granulation A mount: [10:Red, Pink] [11:Pink, Pale] [6:Pink, Pale, Friable] Granulation Quality: [10:None Present (0%)] [11:Large (67-100%)] [6:None Present (0%)] Necrotic A mount: [10:Fat Layer (Subcutaneous Tissue): Yes Fat Layer (Subcutaneous Tissue): Yes Fat Layer (Subcutaneous Tissue): Yes] Exposed Structures: [10:Fascia: No Tendon: No Muscle: No Joint: No Bone: No Small (1-33%)] [11:Fascia: No Tendon: No Muscle: No Joint: No Bone: No None] [6:Fascia: No Tendon: No Muscle: No Joint: No Bone: No Small (1-33%)] Wound Number: [id number] N/A Photos: N/A Left, Lateral Upper Leg Right, Proximal Upper Leg N/A Wound Location: Gradually Appeared Other Lesion N/A Wounding Event: Diabetic Wound/Ulcer of the Lower Lesion N/A Primary Etiology: Extremity Cataracts, Glaucoma, Chronic sinus Cataracts,  Glaucoma, Chronic sinus N/A Comorbid History: problems/congestion, Asthma, problems/congestion, Asthma, Hypertension, Type II Diabetes, Hypertension, Type II Diabetes, Osteoarthritis, Neuropathy Osteoarthritis, Neuropathy 01/06/2021 08/19/2021 N/A Date Acquired: 21 4 N/A Weeks of Treatment: Open Open N/A Wound Status: 12x6.4x5.5 1x2.8x0.1 N/A Measurements L x W x D (cm) 60.319 2.199 N/A A (cm) : rea 331.752 0.22 N/A Volume (cm) : 46.70% -48.20% N/A % Reduction in A rea: -17.30% 25.90% N/A % Reduction in Volume: 9 Position 1 (o'clock): 5.8 Maximum Distance 1 (cm): 12 Position 2 (o'clock): 4 Maximum Distance 2 (cm): Yes No N/A Tunneling: No No N/A Undermining: Grade 2 Full Thickness Without Exposed N/A Classification: Support Structures Large Medium N/A Exudate Amount: Serosanguineous Serosanguineous N/A Exudate Type: red, brown red, brown N/A Exudate Color: Yes No N/A Foul Odor A Cleansing: fter Yes N/A N/A Odor Anticipated Due to Product Use: Distinct, outline attached Distinct, outline attached N/A Wound Margin: Large (67-100%) Large (67-100%) N/A Granulation Amount: Red, Friable Pink N/A Granulation Quality: Small (1-33%) None Present (0%) N/A Necrotic Amount: Fat Layer (Subcutaneous Tissue): Yes Fat Layer (Subcutaneous Tissue): Yes N/A Exposed Structures: Muscle: Yes Fascia: No Fascia: No Tendon: No Tendon: No Muscle: No Joint: No Joint: No Bone: No Bone: No Small (1-33%) None N/A Epithelialization: Treatment Notes Wound #10 (Abdomen - Lower Quadrant) Wound Laterality: Right Cleanser Soap and Water Discharge Instruction: May shower and wash wound with dial antibacterial soap and water prior to dressing change. Peri-Wound Care Zinc Oxide Ointment 30g tube Discharge Instruction: As needed Apply Zinc Oxide to periwound with each dressing change Topical Primary Dressing KerraCel Ag Gelling Fiber Dressing, 4x5 in (silver  alginate) Discharge Instruction: In clinic and patient to continue to use until interdry arrives. Apply silver alginate to wound bed as instructed Secondary Dressing Woven Gauze Sponge, Non-Sterile 4x4 in Discharge Instruction: Apply over primary dressing as directed. ABD Pad, 5x9 Discharge Instruction: Apply over primary dressing as directed. Secured With 68M Medipore H Soft Cloth Surgical T 4 x 2 (in/yd) ape Discharge Instruction: Secure dressing with tape as directed. Compression Wrap Compression Stockings Add-Ons Wound #11 (Upper Leg) Wound Laterality: Left, Medial Cleanser Soap and Water Discharge Instruction: May shower and wash wound with dial antibacterial soap and water prior to dressing change. Peri-Wound Care Zinc Oxide Ointment 30g tube Discharge Instruction: As needed Apply Zinc Oxide to periwound with each dressing change Topical Primary Dressing KerraCel Ag Gelling Fiber Dressing, 4x5 in (silver alginate) Discharge Instruction: In clinic and patient to continue to use until interdry arrives. Apply silver alginate to wound bed as instructed Secondary Dressing Woven Gauze Sponge, Non-Sterile 4x4 in Discharge Instruction:  Apply over primary dressing as directed. ABD Pad, 5x9 Discharge Instruction: Apply over primary dressing as directed. Secured With 29M Medipore H Soft Cloth Surgical T 4 x 2 (in/yd) ape Discharge Instruction: Secure dressing with tape as directed. Compression Wrap Compression Stockings Add-Ons Wound #6 (Upper Leg) Wound Laterality: Right Cleanser Soap and Water Discharge Instruction: May shower and wash wound with dial antibacterial soap and water prior to dressing change. Peri-Wound Care Zinc Oxide Ointment 30g tube Discharge Instruction: As needed Apply Zinc Oxide to periwound with each dressing change Topical Primary Dressing KerraCel Ag Gelling Fiber Dressing, 4x5 in (silver alginate) Discharge Instruction: In clinic and patient to  continue to use until interdry arrives. Apply silver alginate to wound bed as instructed Secondary Dressing Woven Gauze Sponge, Non-Sterile 4x4 in Discharge Instruction: Apply over primary dressing as directed. ABD Pad, 5x9 Discharge Instruction: Apply over primary dressing as directed. Secured With 29M Medipore H Soft Cloth Surgical T 4 x 2 (in/yd) ape Discharge Instruction: Secure dressing with tape as directed. Compression Wrap Compression Stockings Add-Ons Wound #8 (Upper Leg) Wound Laterality: Left, Lateral Cleanser Soap and Water Discharge Instruction: May shower and wash wound with dial antibacterial soap and water prior to dressing change. Wound Cleanser Discharge Instruction: Cleanse the wound with wound cleanser prior to applying a clean dressing using gauze sponges, not tissue or cotton balls. Peri-Wound Care Topical Primary Dressing Wet-to-dry Discharge Instruction: wet to dry in clinic. Wound vac three times a week. Secondary Dressing Woven Gauze Sponges 2x2 in Discharge Instruction: Apply over primary dressing as directed. ABD Pad, 5x9 Discharge Instruction: Apply over primary dressing as directed. Secured With 29M Medipore H Soft Cloth Surgical T ape, 4 x 10 (in/yd) Discharge Instruction: Secure with tape as directed. Compression Wrap Compression Stockings Add-Ons Wound #9 (Upper Leg) Wound Laterality: Right, Proximal Cleanser Soap and Water Discharge Instruction: May shower and wash wound with dial antibacterial soap and water prior to dressing change. Peri-Wound Care Zinc Oxide Ointment 30g tube Discharge Instruction: As needed Apply Zinc Oxide to periwound with each dressing change Topical Primary Dressing KerraCel Ag Gelling Fiber Dressing, 4x5 in (silver alginate) Discharge Instruction: In clinic and patient to continue to use until interdry arrives. Apply silver alginate to wound bed as instructed Secondary Dressing Woven Gauze Sponge, Non-Sterile 4x4  in Discharge Instruction: Apply over primary dressing as directed. ABD Pad, 5x9 Discharge Instruction: Apply over primary dressing as directed. Secured With 29M Medipore H Soft Cloth Surgical T 4 x 2 (in/yd) ape Discharge Instruction: Secure dressing with tape as directed. Compression Wrap Compression Stockings Add-Ons Electronic Signature(s) Signed: 09/28/2021 12:26:16 PM By: Kalman Shan DO Signed: 09/28/2021 4:56:39 PM By: Lorrin Jackson Entered By: Kalman Shan on 09/28/2021 11:58:17 -------------------------------------------------------------------------------- Multi-Disciplinary Care Plan Details Patient Name: Date of Service: [name], [name] 09/28/2021 10:45 A M Medical Record Number: [id number] Patient Account Number: [id number] Date of Birth/Sex: Treating RN: 1951-04-09 (70 y.o. Nancy Fetter Primary Care Fabricio Endsley: [name] Other Clinician: Referring Makaiah Terwilliger: Treating Ijeoma Loor/Extender: Alysia Penna in Treatment: 21 Active Inactive Wound/Skin Impairment Nursing Diagnoses: Impaired tissue integrity Knowledge deficit related to ulceration/compromised skin integrity Goals: Patient will demonstrate a reduced rate of smoking or cessation of smoking Date Initiated: 04/30/2021 Target Resolution Date: 10/09/2021 Goal Status: Active Patient/caregiver will verbalize understanding of skin care regimen Date Initiated: 04/30/2021 Date Inactivated: 07/09/2021 Target Resolution Date: 07/10/2021 Goal Status: Met Ulcer/skin breakdown will have a volume reduction of 50% by week 8 Date Initiated: 07/09/2021 Date  Inactivated: 08/31/2021 Target Resolution Date: 08/31/2021 Goal Status: Met Interventions: Assess patient/caregiver ability to obtain necessary supplies Assess patient/caregiver ability to perform ulcer/skin care regimen upon admission and as needed Assess ulceration(s) every visit Notes: Electronic Signature(s) Signed:  09/28/2021 5:11:19 PM By: Levan Hurst RN, BSN Signed: 09/28/2021 5:11:19 PM By: Levan Hurst RN, BSN Entered By: Levan Hurst on 09/28/2021 11:13:02 -------------------------------------------------------------------------------- Pain Assessment Details Patient Name: Date of Service: [name], [name] 09/28/2021 10:45 A M Medical Record Number: [id number] Patient Account Number: [id number] Date of Birth/Sex: Treating RN: 10-16-1951 (70 y.o. Nancy Fetter Primary Care Leshonda Galambos: [name] Other Clinician: Referring Halia Franey: Treating Haneefah Venturini/Extender: Alysia Penna in Treatment: 21 Active Problems Location of Pain Severity and Description of Pain Patient Has Paino Yes Site Locations Pain Location: Generalized Pain With Dressing Change: Yes Rate the pain. Current Pain Level: 3 Character of Pain Describe the Pain: Aching, Throbbing Pain Management and Medication Current Pain Management: Medication: Yes Cold Application: No Rest: No Massage: No Activity: No T.E.N.S.: No Heat Application: No Leg drop or elevation: No Is the Current Pain Management Adequate: Adequate How does your wound impact your activities of daily livingo Sleep: No Bathing: No Appetite: No Relationship With Others: No Bladder Continence: No Emotions: No Bowel Continence: No Work: No Toileting: No Drive: No Dressing: No Hobbies: No Electronic Signature(s) Signed: 09/28/2021 5:11:19 PM By: Levan Hurst RN, BSN Entered By: Levan Hurst on 09/28/2021 10:31:10 -------------------------------------------------------------------------------- Patient/Caregiver Education Details Patient Name: Date of Service: [name], [name] 11/21/2022andnbsp10:45 A M Medical Record Number: [id number] Patient Account Number: [id number] Date of Birth/Gender: Treating RN: 1951/03/02 (70 y.o. Nancy Fetter Primary Care Physician: [name] Other  Clinician: Referring Physician: Treating Physician/Extender: Alysia Penna in Treatment: 21 Education Assessment Education Provided To: Patient Education Topics Provided Wound/Skin Impairment: Methods: Explain/Verbal Responses: State content correctly [employer]) Signed: 09/28/2021 5:11:19 PM By: Levan Hurst RN, BSN Entered By: Levan Hurst on 09/28/2021 11:13:44 -------------------------------------------------------------------------------- Wound Assessment Details Patient Name: Date of Service: [name], [name] 09/28/2021 10:45 A M Medical Record Number: [id number] Patient Account Number: [id number] Date of Birth/Sex: Treating RN: 29-Jan-1951 (70 y.o. Nancy Fetter Primary Care Patricie Geeslin: [name] Other Clinician: Referring Ferry Matthis: Treating Dandra Velardi/Extender: Alysia Penna in Treatment: 21 Wound Status Wound Number: 10 Primary Pressure Ulcer Etiology: Wound Location: Right Abdomen - Lower Quadrant Wound Open Wounding Event: Gradually Appeared Status: Date Acquired: 09/14/2021 Comorbid Cataracts, Glaucoma, Chronic sinus problems/congestion, Asthma, Weeks Of Treatment: 2 History: Hypertension, Type II Diabetes, Osteoarthritis, Neuropathy Clustered Wound: No Photos Wound Measurements Length: (cm) 0.9 Width: (cm) 0.8 Depth: (cm) 0.1 Area: (cm) 0.565 Volume: (cm) 0.057 Wound Description Classification: Category/Stage III Wound Margin: Distinct, outline attached Exudate Amount: Medium Exudate Type: Serosanguineous Exudate Color: red, brown Foul Odor After Cleansing: Slough/Fibrino % Reduction in Area: -28.4% % Reduction in Volume: 35.2% Epithelialization: Small (1-33%) Tunneling: No Undermining: Yes Starting Position (o'clock): 9 Ending Position (o'clock): 12 Maximum Distance: (cm) 0.4 No No Wound Bed Granulation Amount: Large (67-100%) Exposed Structure Granulation  Quality: Red, Pink Fascia Exposed: No Necrotic Amount: None Present (0%) Fat Layer (Subcutaneous Tissue) Exposed: Yes Tendon Exposed: No Muscle Exposed: No Joint Exposed: No Bone Exposed: No Treatment Notes Wound #10 (Abdomen - Lower Quadrant) Wound Laterality: Right Cleanser Soap and Water Discharge Instruction: May shower and wash wound with dial antibacterial soap and water prior to dressing change. Peri-Wound Care Zinc Oxide Ointment 30g  tube Discharge Instruction: As needed Apply Zinc Oxide to periwound with each dressing change Topical Primary Dressing KerraCel Ag Gelling Fiber Dressing, 4x5 in (silver alginate) Discharge Instruction: In clinic and patient to continue to use until interdry arrives. Apply silver alginate to wound bed as instructed Secondary Dressing Woven Gauze Sponge, Non-Sterile 4x4 in Discharge Instruction: Apply over primary dressing as directed. ABD Pad, 5x9 Discharge Instruction: Apply over primary dressing as directed. Secured With 56M Medipore H Soft Cloth Surgical T 4 x 2 (in/yd) ape Discharge Instruction: Secure dressing with tape as directed. Compression Wrap Compression Stockings Add-Ons Electronic Signature(s) Signed: 09/28/2021 5:11:19 PM By: Levan Hurst RN, BSN Entered By: Levan Hurst on 09/28/2021 10:55:54 -------------------------------------------------------------------------------- Wound Assessment Details Patient Name: Date of Service: [name], [name] 09/28/2021 10:45 A M Medical Record Number: [id number] Patient Account Number: [id number] Date of Birth/Sex: Treating RN: 1951/04/22 (70 y.o. Nancy Fetter Primary Care Graceson Nichelson: [name] Other Clinician: Referring Harim Bi: Treating Juwann Sherk/Extender: Alysia Penna in Treatment: 21 Wound Status Wound Number: 11 Primary Pressure Ulcer Etiology: Wound Location: Left, Medial Upper Leg Wound Open Wounding Event: Gradually  Appeared Status: Date Acquired: 09/14/2021 Comorbid Cataracts, Glaucoma, Chronic sinus problems/congestion, Asthma, Weeks Of Treatment: 2 History: Hypertension, Type II Diabetes, Osteoarthritis, Neuropathy Clustered Wound: No Photos Wound Measurements Length: (cm) 0.4 Width: (cm) 0.8 Depth: (cm) 0.2 Area: (cm) 0.251 Volume: (cm) 0.05 % Reduction in Area: -112.7% % Reduction in Volume: -108.3% Epithelialization: None Tunneling: No Undermining: No Wound Description Classification: Category/Stage II Wound Margin: Distinct, outline attached Exudate Amount: Medium Exudate Type: Serosanguineous Exudate Color: red, brown Foul Odor After Cleansing: No Slough/Fibrino Yes Wound Bed Granulation Amount: Small (1-33%) Exposed Structure Granulation Quality: Pink, Pale Fascia Exposed: No Necrotic Amount: Large (67-100%) Fat Layer (Subcutaneous Tissue) Exposed: Yes Necrotic Quality: Adherent Slough Tendon Exposed: No Muscle Exposed: No Joint Exposed: No Bone Exposed: No Treatment Notes Wound #11 (Upper Leg) Wound Laterality: Left, Medial Cleanser Soap and Water Discharge Instruction: May shower and wash wound with dial antibacterial soap and water prior to dressing change. Peri-Wound Care Zinc Oxide Ointment 30g tube Discharge Instruction: As needed Apply Zinc Oxide to periwound with each dressing change Topical Primary Dressing KerraCel Ag Gelling Fiber Dressing, 4x5 in (silver alginate) Discharge Instruction: In clinic and patient to continue to use until interdry arrives. Apply silver alginate to wound bed as instructed Secondary Dressing Woven Gauze Sponge, Non-Sterile 4x4 in Discharge Instruction: Apply over primary dressing as directed. ABD Pad, 5x9 Discharge Instruction: Apply over primary dressing as directed. Secured With 56M Medipore H Soft Cloth Surgical T 4 x 2 (in/yd) ape Discharge Instruction: Secure dressing with tape as directed. Compression  Wrap Compression Stockings Add-Ons Electronic Signature(s) Signed: 09/28/2021 5:11:19 PM By: Levan Hurst RN, BSN Entered By: Levan Hurst on 09/28/2021 10:56:33 -------------------------------------------------------------------------------- Wound Assessment Details Patient Name: Date of Service: [name], [name] 09/28/2021 10:45 A M Medical Record Number: [id number] Patient Account Number: [id number] Date of Birth/Sex: Treating RN: [birth year] (70 y.o. Nancy Fetter Primary Care Daniil Labarge: [name] Other Clinician: Referring Prapti Grussing: Treating Tasean Mancha/Extender: Alysia Penna in Treatment: 21 Wound Status Wound Number: 6 Primary Diabetic Wound/Ulcer of the Lower Extremity Etiology: Wound Location: Right Upper Leg Wound Open Wounding Event: Gradually Appeared Status: Date Acquired: 11/06/2020 Comorbid Cataracts, Glaucoma, Chronic sinus problems/congestion, Asthma, Weeks Of Treatment: 21 History: Hypertension, Type II Diabetes, Osteoarthritis, Neuropathy Clustered Wound: No Photos Wound Measurements Length: (cm) 1 Width: (cm) 1 Depth: (  cm) 1 Area: (cm) 0.785 Volume: (cm) 0.785 % Reduction in Area: 97.2% % Reduction in Volume: 97.2% Epithelialization: Small (1-33%) Tunneling: No Undermining: No Wound Description Classification: Grade 2 Wound Margin: Distinct, outline attached Exudate Amount: Medium Exudate Type: Serous Exudate Color: amber Foul Odor After Cleansing: No Slough/Fibrino Yes Wound Bed Granulation Amount: Large (67-100%) Exposed Structure Granulation Quality: Pink, Pale, Friable Fascia Exposed: No Necrotic Amount: None Present (0%) Fat Layer (Subcutaneous Tissue) Exposed: Yes Tendon Exposed: No Muscle Exposed: No Joint Exposed: No Bone Exposed: No Treatment Notes Wound #6 (Upper Leg) Wound Laterality: Right Cleanser Soap and Water Discharge Instruction: May shower and wash wound with dial  antibacterial soap and water prior to dressing change. Peri-Wound Care Zinc Oxide Ointment 30g tube Discharge Instruction: As needed Apply Zinc Oxide to periwound with each dressing change Topical Primary Dressing KerraCel Ag Gelling Fiber Dressing, 4x5 in (silver alginate) Discharge Instruction: In clinic and patient to continue to use until interdry arrives. Apply silver alginate to wound bed as instructed Secondary Dressing Woven Gauze Sponge, Non-Sterile 4x4 in Discharge Instruction: Apply over primary dressing as directed. ABD Pad, 5x9 Discharge Instruction: Apply over primary dressing as directed. Secured With 30M Medipore H Soft Cloth Surgical T 4 x 2 (in/yd) ape Discharge Instruction: Secure dressing with tape as directed. Compression Wrap Compression Stockings Add-Ons Electronic Signature(s) Signed: 09/28/2021 5:11:19 PM By: Levan Hurst RN, BSN Entered By: Levan Hurst on 09/28/2021 10:58:08 -------------------------------------------------------------------------------- Wound Assessment Details Patient Name: Date of Service: [name], [name] 09/28/2021 10:45 A M Medical Record Number: [id number] Patient Account Number: [id number] Date of Birth/Sex: Treating RN: 1951-09-10 (70 y.o. Nancy Fetter Primary Care Jaxsin Bottomley: [name] Other Clinician: Referring Grayland Daisey: Treating Kambre Messner/Extender: Alysia Penna in Treatment: 21 Wound Status Wound Number: 8 Primary Diabetic Wound/Ulcer of the Lower Extremity Etiology: Wound Location: Left, Lateral Upper Leg Wound Open Wounding Event: Gradually Appeared Status: Date Acquired: 01/06/2021 Comorbid Cataracts, Glaucoma, Chronic sinus problems/congestion, Asthma, Weeks Of Treatment: 21 History: Hypertension, Type II Diabetes, Osteoarthritis, Neuropathy Clustered Wound: No Photos Wound Measurements Length: (cm) 12 Width: (cm) 6.4 Depth: (cm) 5.5 Area: (cm) 60.319 Volume:  (cm) 331.752 % Reduction in Area: 46.7% % Reduction in Volume: -17.3% Epithelialization: Small (1-33%) Tunneling: Yes Location 1 Position (o'clock): 9 Maximum Distance: (cm) 5.8 Location 2 Position (o'clock): 12 Maximum Distance: (cm) 4 Undermining: No Wound Description Classification: Grade 2 Wound Margin: Distinct, outline attached Exudate Amount: Large Exudate Type: Serosanguineous Exudate Color: red, brown Foul Odor After Cleansing: Yes Due to Product Use: Yes Slough/Fibrino Yes Wound Bed Granulation Amount: Large (67-100%) Exposed Structure Granulation Quality: Red, Friable Fascia Exposed: No Necrotic Amount: Small (1-33%) Fat Layer (Subcutaneous Tissue) Exposed: Yes Necrotic Quality: Adherent Slough Tendon Exposed: No Muscle Exposed: Yes Necrosis of Muscle: No Joint Exposed: No Bone Exposed: No Treatment Notes Wound #8 (Upper Leg) Wound Laterality: Left, Lateral Cleanser Soap and Water Discharge Instruction: May shower and wash wound with dial antibacterial soap and water prior to dressing change. Wound Cleanser Discharge Instruction: Cleanse the wound with wound cleanser prior to applying a clean dressing using gauze sponges, not tissue or cotton balls. Peri-Wound Care Topical Primary Dressing Wet-to-dry Discharge Instruction: wet to dry in clinic. Wound vac three times a week. Secondary Dressing Woven Gauze Sponges 2x2 in Discharge Instruction: Apply over primary dressing as directed. ABD Pad, 5x9 Discharge Instruction: Apply over primary dressing as directed. Secured With 30M Medipore H Soft Cloth Surgical T ape, 4 x 10 (in/yd) Discharge  Instruction: Secure with tape as directed. Compression Wrap Compression Stockings Add-Ons Electronic Signature(s) Signed: 09/28/2021 5:11:19 PM By: Levan Hurst RN, BSN Entered By: Levan Hurst on 09/28/2021 10:58:56 -------------------------------------------------------------------------------- Wound  Assessment Details Patient Name: Date of Service: [name], [name] 09/28/2021 10:45 A M Medical Record Number: [id number] Patient Account Number: [id number] Date of Birth/Sex: Treating RN: 11-27-1950 (70 y.o. Nancy Fetter Primary Care Natanael Saladin: [name] Other Clinician: Referring Calyn Rubi: Treating Elesia Pemberton/Extender: Alysia Penna in Treatment: 21 Wound Status Wound Number: 9 Primary Lesion Etiology: Wound Location: Right, Proximal Upper Leg Wound Open Wounding Event: Other Lesion Status: Date Acquired: 08/19/2021 Comorbid Cataracts, Glaucoma, Chronic sinus problems/congestion, Asthma, Weeks Of Treatment: 4 History: Hypertension, Type II Diabetes, Osteoarthritis, Neuropathy Clustered Wound: No Photos Wound Measurements Length: (cm) 1 Width: (cm) 2.8 Depth: (cm) 0.1 Area: (cm) 2.199 Volume: (cm) 0.22 % Reduction in Area: -48.2% % Reduction in Volume: 25.9% Epithelialization: None Tunneling: No Undermining: No Wound Description Classification: Full Thickness Without Exposed Support Structures Wound Margin: Distinct, outline attached Exudate Amount: Medium Exudate Type: Serosanguineous Exudate Color: red, brown Foul Odor After Cleansing: No Slough/Fibrino No Wound Bed Granulation Amount: Large (67-100%) Exposed Structure Granulation Quality: Pink Fascia Exposed: No Necrotic Amount: None Present (0%) Fat Layer (Subcutaneous Tissue) Exposed: Yes Tendon Exposed: No Muscle Exposed: No Joint Exposed: No Bone Exposed: No Treatment Notes Wound #9 (Upper Leg) Wound Laterality: Right, Proximal Cleanser Soap and Water Discharge Instruction: May shower and wash wound with dial antibacterial soap and water prior to dressing change. Peri-Wound Care Zinc Oxide Ointment 30g tube Discharge Instruction: As needed Apply Zinc Oxide to periwound with each dressing change Topical Primary Dressing KerraCel Ag Gelling Fiber Dressing,  4x5 in (silver alginate) Discharge Instruction: In clinic and patient to continue to use until interdry arrives. Apply silver alginate to wound bed as instructed Secondary Dressing Woven Gauze Sponge, Non-Sterile 4x4 in Discharge Instruction: Apply over primary dressing as directed. ABD Pad, 5x9 Discharge Instruction: Apply over primary dressing as directed. Secured With 64M Medipore H Soft Cloth Surgical T 4 x 2 (in/yd) ape Discharge Instruction: Secure dressing with tape as directed. Compression Wrap Compression Stockings Add-Ons Electronic Signature(s) Signed: 09/28/2021 5:11:19 PM By: Levan Hurst RN, BSN Entered By: Levan Hurst on 09/28/2021 10:59:44 -------------------------------------------------------------------------------- Vitals Details Patient Name: Date of Service: [name], [name] 09/28/2021 10:45 A M Medical Record Number: [id number] Patient Account Number: [id number] Date of Birth/Sex: Treating RN: 03-08-1951 (70 y.o. Nancy Fetter Primary Care Vashon Riordan: [name] Other Clinician: Referring Tyreka Henneke: Treating Johannah Rozas/Extender: Alysia Penna in Treatment: 21 Vital Signs Time Taken: 10:29 Temperature (F): 98.2 Height (in): 63 Pulse (bpm): 74 Weight (lbs): 279 Respiratory Rate (breaths/min): 18 Body Mass Index (BMI): 49.4 Blood Pressure (mmHg): 144/74 Capillary Blood Glucose (mg/dl): 131 Reference Range: 80 - 120 mg / dl Notes glucose per pt report Electronic Signature(s) Signed: 09/28/2021 5:11:19 PM By: Levan Hurst RN, BSN Entered By: Levan Hurst on 09/28/2021 10:30:49

## 2021-10-07 DIAGNOSIS — L02612 Cutaneous abscess of left foot: Secondary | ICD-10-CM | POA: Diagnosis not present

## 2021-10-07 DIAGNOSIS — L8990 Pressure ulcer of unspecified site, unspecified stage: Secondary | ICD-10-CM | POA: Diagnosis not present

## 2021-10-08 DIAGNOSIS — L97829 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ity: Secondary | ICD-10-CM | POA: Diagnosis not present

## 2021-10-08 DIAGNOSIS — E1169 Type 2 diabetes mellitus with other specified complication: Secondary | ICD-10-CM | POA: Diagnosis not present

## 2021-10-11 DIAGNOSIS — L8990 Pressure ulcer of unspecified site, unspecified stage: Secondary | ICD-10-CM | POA: Diagnosis not present

## 2021-10-11 DIAGNOSIS — L02612 Cutaneous abscess of left foot: Secondary | ICD-10-CM | POA: Diagnosis not present

## 2021-10-12 ENCOUNTER — Encounter (HOSPITAL_BASED_OUTPATIENT_CLINIC_OR_DEPARTMENT_OTHER): Payer: Medicare Other | Admitting: Internal Medicine

## 2021-10-24 IMAGING — DX DG CHEST 1V PORT
1 series · 1 of 1 positions shown · non-contrast
Comparison: 12/25/2015

CLINICAL DATA: Diabetic ulcer to the left foot with redness
radiating up the left leg. Generalized weakness. Concern for sepsis.

EXAM:
PORTABLE CHEST 1 VIEW

[chest ap]
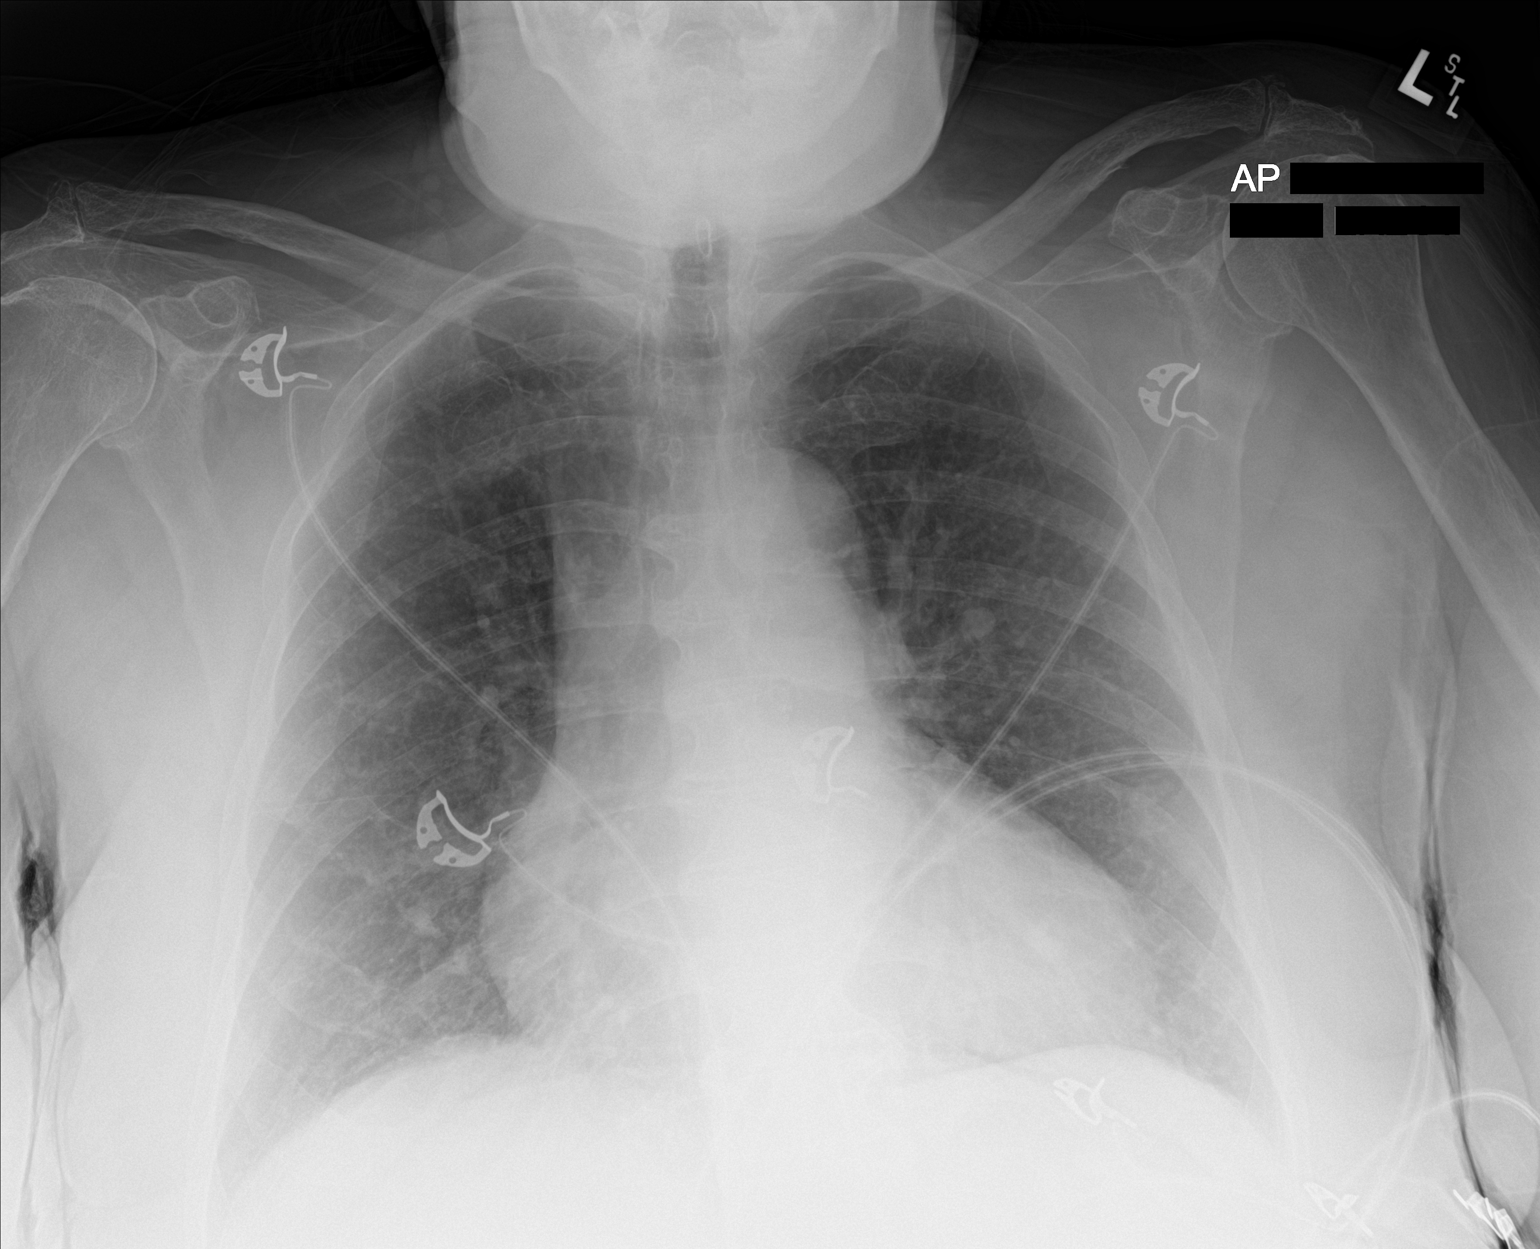

[1 of 1 positions shown; findings below may reference images not displayed]

FINDINGS: Cardiac enlargement. Normal pulmonary vascularity. Lungs are clear.
No pleural effusions. No pneumothorax. Mediastinal contours appear
intact.
IMPRESSION: Cardiac enlargement.  No active disease.

## 2021-10-24 IMAGING — DX DG FOOT COMPLETE 3+V*L*
3 series · 3 of 3 positions shown · non-contrast
Comparison: 02/17/2011

CLINICAL DATA: Diabetic ulcer over the left heel with redness
radiating up the left leg.

EXAM:
LEFT FOOT - COMPLETE 3+ VIEW

[foot ap]
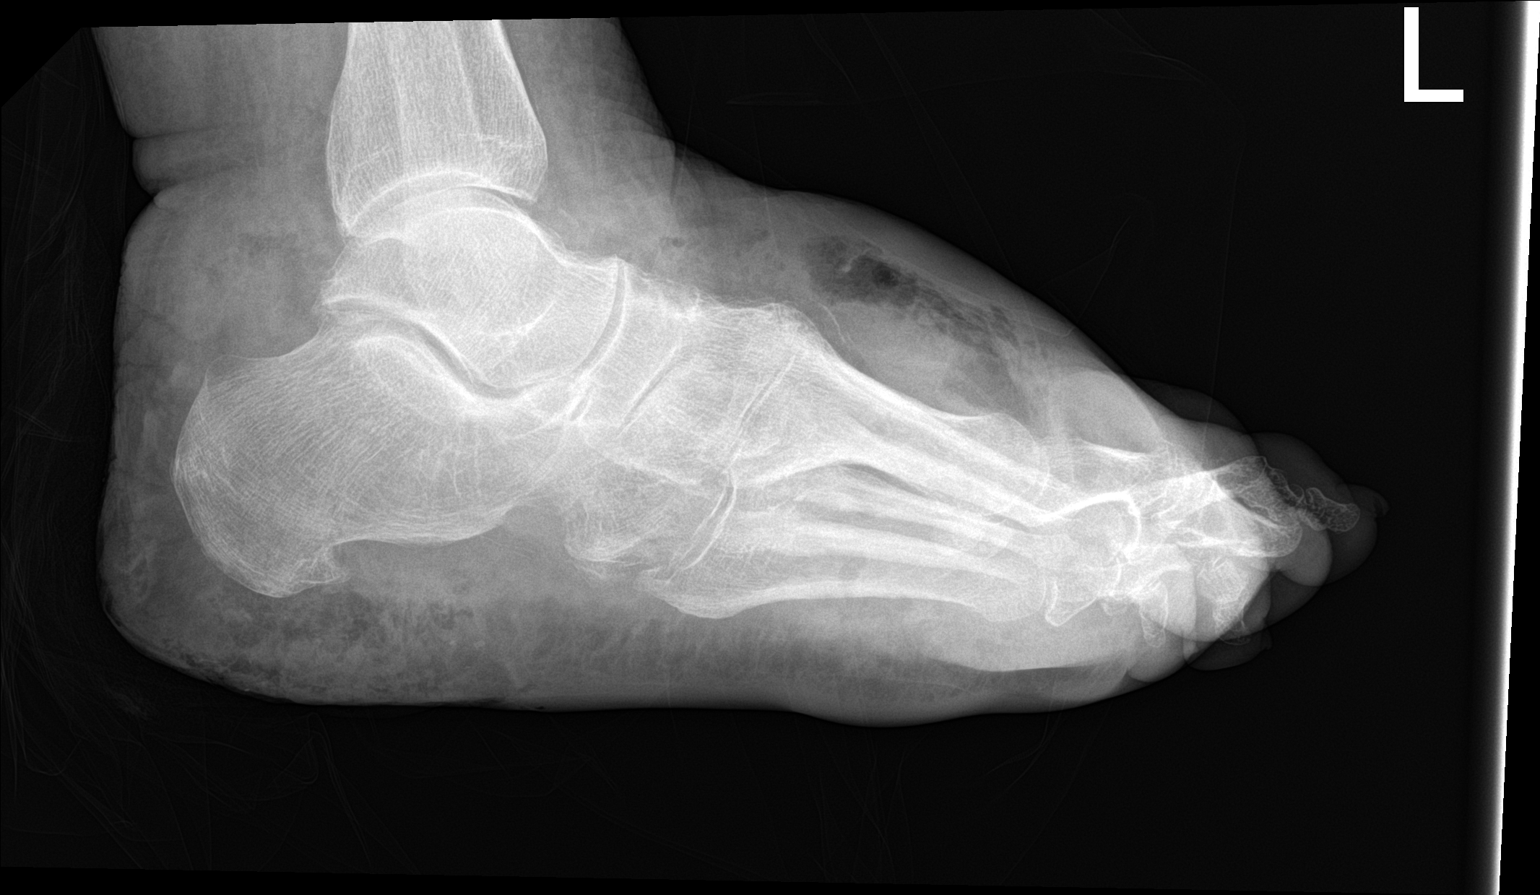

[foot obl (1 of 2)]
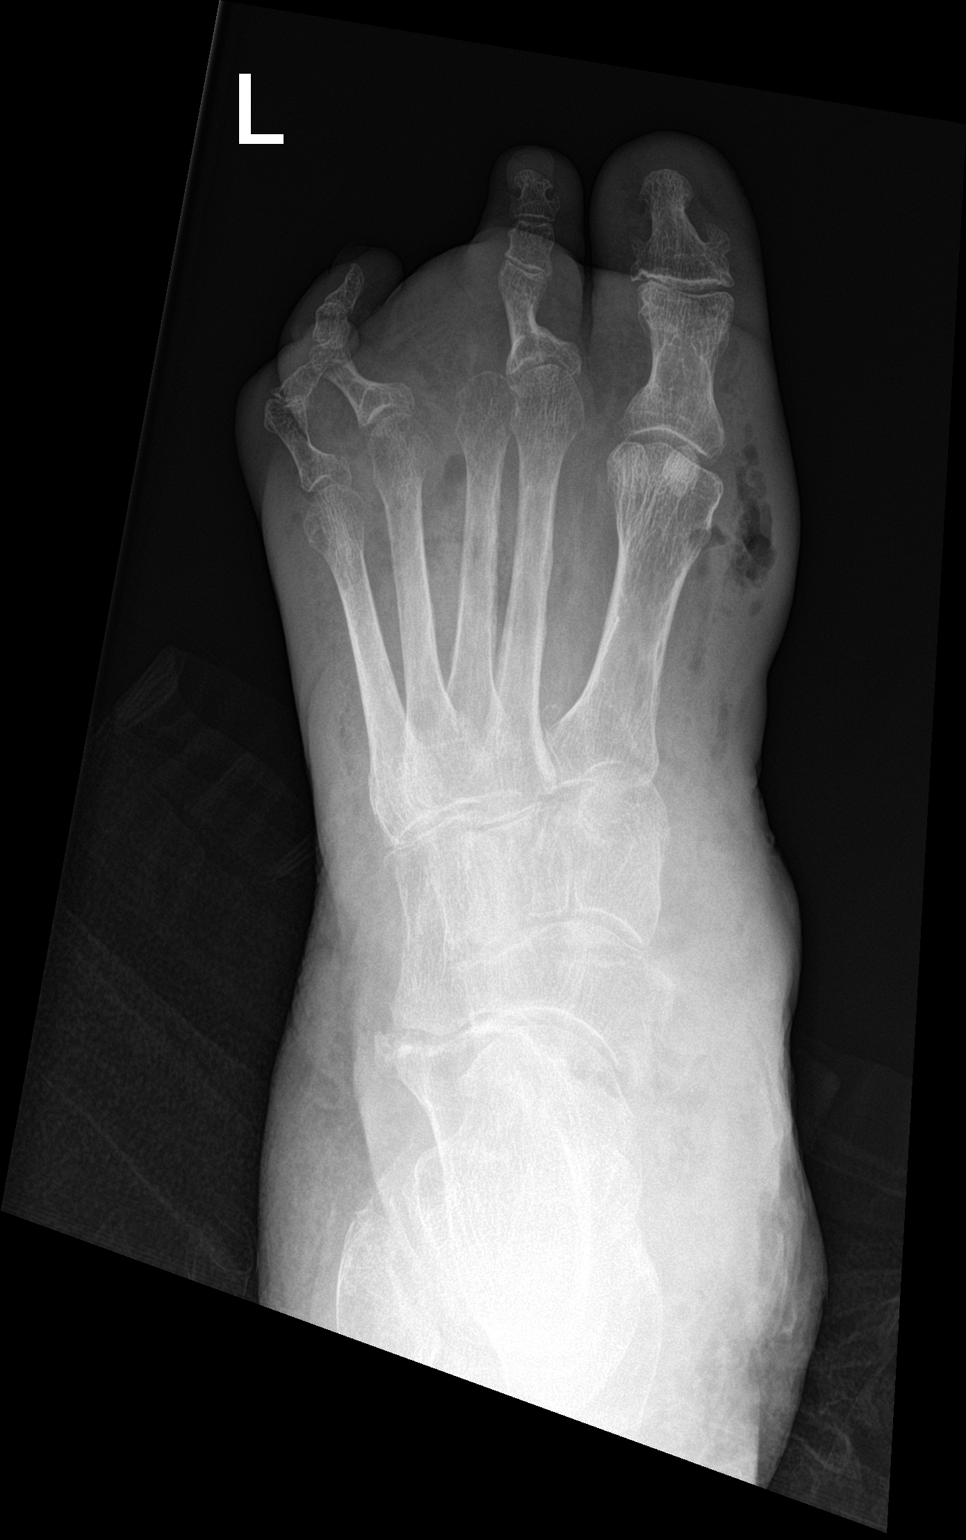

[foot obl (2 of 2)]
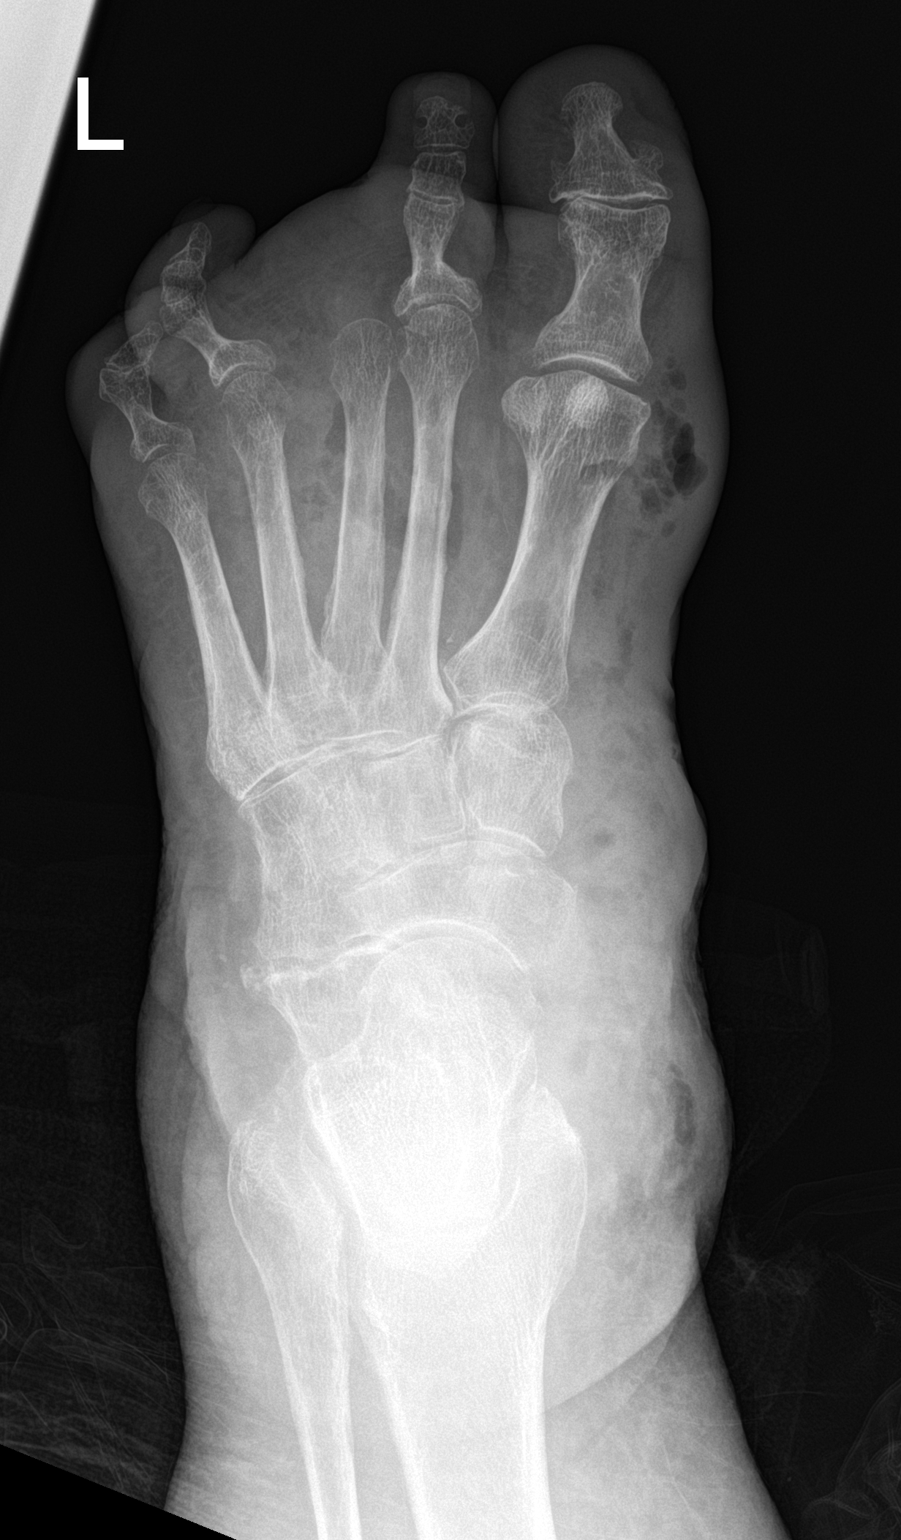

[3 of 3 positions shown; findings below may reference images not displayed]

FINDINGS: Previous amputation of the left third toe at the metatarsal
phalangeal level. Degenerative changes in the interphalangeal,
metatarsal-phalangeal, and intertarsal joints. No acute fracture or
dislocation. Superficial ulceration is suggested in the plantar soft
tissues over the calcaneus consistent with history of ulceration. No
bone changes to suggest osteomyelitis. Prominent diffuse soft tissue
swelling over the left foot with soft tissue gas over the plantar
and dorsal aspects. Changes are consistent with cellulitis due to
gas-forming organism.
IMPRESSION: Superficial ulceration in the plantar soft tissues over the
calcaneus. Soft tissue gas over the plantar and dorsal aspects of
the foot consistent with cellulitis due to gas-forming organism. No
evidence of osteomyelitis.

## 2021-10-26 ENCOUNTER — Encounter (HOSPITAL_BASED_OUTPATIENT_CLINIC_OR_DEPARTMENT_OTHER): Payer: Medicare Other | Attending: Internal Medicine | Admitting: Internal Medicine

## 2021-10-26 ENCOUNTER — Other Ambulatory Visit: Payer: Self-pay

## 2021-10-26 DIAGNOSIS — I10 Essential (primary) hypertension: Secondary | ICD-10-CM | POA: Insufficient documentation

## 2021-10-26 DIAGNOSIS — E119 Type 2 diabetes mellitus without complications: Secondary | ICD-10-CM

## 2021-10-26 DIAGNOSIS — S31109A Unspecified open wound of abdominal wall, unspecified quadrant without penetration into peritoneal cavity, initial encounter: Secondary | ICD-10-CM | POA: Diagnosis not present

## 2021-10-26 DIAGNOSIS — E1169 Type 2 diabetes mellitus with other specified complication: Secondary | ICD-10-CM | POA: Diagnosis not present

## 2021-10-26 DIAGNOSIS — R531 Weakness: Secondary | ICD-10-CM | POA: Diagnosis not present

## 2021-10-26 DIAGNOSIS — Z743 Need for continuous supervision: Secondary | ICD-10-CM | POA: Diagnosis not present

## 2021-10-26 DIAGNOSIS — L97819 Non-pressure chronic ulcer of other part of right lower leg with unspecified severity: Secondary | ICD-10-CM | POA: Insufficient documentation

## 2021-10-26 DIAGNOSIS — L97829 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ity: Secondary | ICD-10-CM | POA: Insufficient documentation

## 2021-10-26 DIAGNOSIS — Z89512 Acquired absence of left leg below knee: Secondary | ICD-10-CM | POA: Insufficient documentation

## 2021-10-26 DIAGNOSIS — E11622 Type 2 diabetes mellitus with other skin ulcer: Secondary | ICD-10-CM | POA: Insufficient documentation

## 2021-10-26 DIAGNOSIS — S31109D Unspecified open wound of abdominal wall, unspecified quadrant without penetration into peritoneal cavity, subsequent encounter: Secondary | ICD-10-CM

## 2021-10-26 DIAGNOSIS — X58XXXA Exposure to other specified factors, initial encounter: Secondary | ICD-10-CM | POA: Insufficient documentation

## 2021-10-26 DIAGNOSIS — E1142 Type 2 diabetes mellitus with diabetic polyneuropathy: Secondary | ICD-10-CM | POA: Diagnosis not present

## 2021-10-26 DIAGNOSIS — S71101A Unspecified open wound, right thigh, initial encounter: Secondary | ICD-10-CM | POA: Diagnosis not present

## 2021-10-26 NOTE — Progress Notes (Signed)
Yesenia Payne, Yesenia Payne ([id number]) Visit Report for 10/26/2021 Chief Complaint Document Details Patient Name: Date of Service: [name], [name] 10/26/2021 10:45 A M Medical Record Number: [id number] Patient Account Number: [id number] Date of Birth/Sex: Treating RN: 1951-04-14 (70 y.o. Yesenia Payne Primary Care Provider: [name] Other Clinician: Referring Provider: Treating Provider/Extender: Alysia Penna in Treatment: 25 Information Obtained from: Patient Chief Complaint Left lower extremity wounds and right upper leg wound, Pannus wounds Electronic Signature(s) Signed: 10/26/2021 12:12:26 PM By: Kalman Shan DO Entered By: Kalman Shan on 10/26/2021 12:07:06 -------------------------------------------------------------------------------- Debridement Details Patient Name: Date of Service: [name], [name] 10/26/2021 10:45 A M Medical Record Number: [id number] Patient Account Number: [id number] Date of Birth/Sex: Treating RN: Jul 31, 1951 (69 y.o. Yesenia Payne, Yesenia Payne Primary Care Provider: [name] Other Clinician: Referring Provider: Treating Provider/Extender: Alysia Penna in Treatment: 25 Debridement Performed for Assessment: Wound #12 Left Abdomen - Lower Quadrant Performed By: Clinician Yesenia Pilling, RN Debridement Type: Chemical/Enzymatic/Mechanical Agent Used: Santyl Level of Consciousness (Pre-procedure): Awake and Alert Pre-procedure Verification/Time Out No Taken: Bleeding: None Response to Treatment: Procedure was tolerated well Level of Consciousness (Post- Awake and Alert procedure): Post Debridement Measurements of Total Wound Length: (cm) 0.8 Stage: Category/Stage III Width: (cm) 1 Depth: (cm) 0.5 Volume: (cm) 0.314 Character of Wound/Ulcer Post Debridement: Requires Further Debridement Post Procedure Diagnosis Same as Pre-procedure Electronic Signature(s) Signed:  10/26/2021 12:12:26 PM By: Kalman Shan DO Signed: 10/26/2021 5:04:55 PM By: Yesenia Pilling RN, BSN Entered By: Yesenia Payne on 10/26/2021 11:22:10 -------------------------------------------------------------------------------- HPI Details Patient Name: Date of Service: [name], [place] RET D. 10/26/2021 10:45 A M Medical Record Number: [id number] Patient Account Number: [id number] Date of Birth/Sex: Treating RN: Oct 03, 1951 (70 y.o. Yesenia Payne Primary Care Provider: [name] Other Clinician: Referring Provider: Treating Provider/Extender: Alysia Penna in Treatment: 25 History of Present Illness HPI Description: Admission 04/30/2021 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 She has been seen in our clinic multiple times last seen in 03/2019 for right toe wounds. Today she presents for 3 wounds. 1 is located to the right upper leg and 2 to the left lower extremity. They started at the end of December And not sure how I started. She had not been using any dressings up until 2 weeks ago. She is currently using silver alginate to the right upper leg and Santyl to the wounds on the left leg. She has been on doxycycline and 2 rounds of clindamycin for cellulitis to these wounds. She finished her last round of antibiotics 3 weeks ago. She reports minimal pain to the wound. She currently denies signs of infection including increased erythema, warmth or purulent drainage. 7/5; patient presents for 1 week follow-up. She has been using Santyl to the left lower extremity wounds and silver alginate to the right upper leg wound. She denies signs of infection. She has no complaints today. 7/19; patient presents for 1 week follow-up. She has been using Santyl to the left lower extremity wounds and silver alginate to the right  upper leg wound. She denies signs of infection. She has had to use a lot of Santyl to the lateral wound since the necrotic tissue has been breaking down and the wound is deeper. 8/4; patient presents for 2-week follow-up. She has been using Santyl to the left  lower extremity wound and silver alginate to the right upper leg wound. She has been using the wound VAC to the left lateral wound. She has no issues or complaints today. She denies signs of infection. 8/18; patient presents for 2-week follow-up. She has been using Santyl and silver alginate to the right upper wound. She reports that this is deeper today. She has been using the wound VAC to the left lateral wound. She has no issues or complaints today. She denies signs of infection. She reports that the left lower extremity medial wound is healed 9/1; patient presents for 2-week follow-up. She continues to use silver alginate to the right upper wound. She is using the wound VAC to the left lateral wound. She has no issues today. She denies signs of infection. She overall feels well. 10/10; patient presents for follow-up. Patient has not followed up in a month. She states that her husband was recently in the ICU but is now at home. She reports an increase in the wound size to the right lower extremity. She reports improvement to the left lower extremity wound. She currently denies signs of infection. She states she overall feels well. 10/24; patient presents for follow-up. She has a new wound to the right upper thigh. She has been keeping the area covered until yesterday when she started silver alginate. She continues to use Dakin wet-to-dry dressings to the other right upper leg wound. She continues to use the [facility] to the left lower extremity wound. No obvious signs of infection on exam. 11/7; patient presents for follow-up. She again has new wounds to her pannus folds. She is keeping the area dry with absorbent pads and using silver alginate to  the wound beds. She continues to use the wound VAC to the left lower extremity wound without issues. She denies signs of infection. 11/21; patient presents for follow-up. She continues to have new wounds to her pannus folds. Her daughter helps with dressing changes and is keeping the areas dry with absorbent pads and using silver alginate to the wound beds. She continues to use the wound VAC to the left lower extremity wound without issues. She currently denies signs of infection. 12/19; patient presents for follow-up. She continues to have new wounds to her pannus folds. Her daughter helps with the dressings and has been using Hibiclens and silver alginate to the wound beds. Patient continues to use the wound VAC to the left lower extremity wound. She has no issues or complaints today. She denies signs of infection. Electronic Signature(s) Signed: 10/26/2021 12:12:26 PM By: Kalman Shan DO Entered By: Kalman Shan on 10/26/2021 12:08:02 -------------------------------------------------------------------------------- Physical Exam Details Patient Name: Date of Service: [name], [name] 10/26/2021 10:45 A M Medical Record Number: [id number] Patient Account Number: [id number] Date of Birth/Sex: Treating RN: 20-Sep-1951 (70 y.o. Yesenia Payne Primary Care Provider: [name] Other Clinician: Referring Provider: Treating Provider/Extender: Alysia Penna in Treatment: 25 Constitutional respirations regular, non-labored and within target range for patient.Marland Kitchen Psychiatric pleasant and cooperative. Notes Left lower extremity: Large open wound with granulation tissue and three areas of tunneling. No obvious signs of infection. Overall appears well-healing. Several wounds to the pannus folds and thigh folds. All with granulation tissue except for one that has nonviable tissue present. Electronic Signature(s) Signed: 10/26/2021 12:12:26 PM By: Kalman Shan DO Entered By: Kalman Shan on 10/26/2021 12:09:19 -------------------------------------------------------------------------------- Physician Orders Details Patient Name: Date of Service: [place] RTO N, [name] 10/26/2021  10:45 A M Medical Record Number: [id number] Patient Account Number: [id number] Date of Birth/Sex: Treating RN: [birth year] (70 y.o. Yesenia Payne, Meta.Reding Primary Care Provider: [name] Other Clinician: Referring Provider: Treating Provider/Extender: Alysia Penna in Treatment: 25 Verbal / Phone Orders: No Diagnosis Coding ICD-10 Coding Code Description [phone] 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ity L97.819 Non-pressure chronic ulcer of other part of right lower leg with unspecified severity E11.9 Type 2 diabetes mellitus without complications D74.128 Acquired absence of left leg below knee S31.109D Unspecified open wound of abdominal wall, unspecified quadrant without penetration into peritoneal cavity, subsequent encounter Follow-up Appointments Return appointment in 3 weeks. - Dr. Heber [place] - ***STRETCHER - 60 minutes extra time*** Bathing/ Shower/ Hygiene Other Bathing/Shower/Hygiene Orders/Instructions: - You may sponge bathe use hibiclens wash with dressing changes with abdominal skin folds. Negative Presssure Wound Therapy Wound #8 Left,Lateral Upper Leg Wound Vac to wound continuously at 134m/hg pressure - Change three times a week. Black Foam White Foam - White foam to deep areas at 7:00, 9:00, and 11:00 Off-Loading Turn and reposition every 2 hours Wound Treatment Wound #10 - Abdomen - Lower Quadrant Wound Laterality: Right Cleanser: Soap and Water 1 x Per Day/30 Days Discharge Instructions: May shower and wash wound with dial antibacterial soap and water prior to dressing change. Peri-Wound Care: Zinc Oxide Ointment 30g tube 1 x Per Day/30 Days Discharge Instructions: As  needed Apply Zinc Oxide to periwound with each dressing change Prim Dressing: KerraCel Ag Gelling Fiber Dressing, 4x5 in (silver alginate) (DME) (Generic) 1 x Per Day/30 Days ary Discharge Instructions: In clinic and patient to continue to use until interdry arrives. Apply silver alginate to wound bed as instructed Secondary Dressing: Woven Gauze Sponge, Non-Sterile 4x4 in (DME) (Generic) 1 x Per Day/30 Days Discharge Instructions: Apply over primary dressing as directed. Secondary Dressing: ABD Pad, 5x9 (Generic) 1 x Per Day/30 Days Discharge Instructions: Apply over primary dressing as directed. Secured With: 78M Medipore H Soft Cloth Surgical T 4 x 2 (in/yd) (Generic) 1 x Per Day/30 Days ape Discharge Instructions: Secure dressing with tape as directed. Wound #11 - Upper Leg Wound Laterality: Left, Medial Cleanser: Soap and Water 1 x Per Day/30 Days Discharge Instructions: May shower and wash wound with dial antibacterial soap and water prior to dressing change. Peri-Wound Care: Zinc Oxide Ointment 30g tube 1 x Per Day/30 Days Discharge Instructions: As needed Apply Zinc Oxide to periwound with each dressing change Prim Dressing: KerraCel Ag Gelling Fiber Dressing, 4x5 in (silver alginate) (DME) (Generic) 1 x Per Day/30 Days ary Discharge Instructions: In clinic and patient to continue to use until interdry arrives. Apply silver alginate to wound bed as instructed Secondary Dressing: Woven Gauze Sponge, Non-Sterile 4x4 in (DME) (Generic) 1 x Per Day/30 Days Discharge Instructions: Apply over primary dressing as directed. Secondary Dressing: ABD Pad, 5x9 (Generic) 1 x Per Day/30 Days Discharge Instructions: Apply over primary dressing as directed. Secured With: 78M Medipore H Soft Cloth Surgical T 4 x 2 (in/yd) (Generic) 1 x Per Day/30 Days ape Discharge Instructions: Secure dressing with tape as directed. Wound #12 - Abdomen - Lower Quadrant Wound Laterality: Left Cleanser: Soap and Water  1 x Per Day/30 Days Discharge Instructions: May shower and wash wound with dial antibacterial soap and water prior to dressing change. Peri-Wound Care: Zinc Oxide Ointment 30g tube 1 x Per Day/30 Days Discharge Instructions: As needed Apply Zinc Oxide to periwound with each dressing change  Prim Dressing: KerraCel Ag Gelling Fiber Dressing, 4x5 in (silver alginate) (DME) (Generic) 1 x Per Day/30 Days ary Discharge Instructions: In clinic and patient to continue to use until interdry arrives. Apply silver alginate to wound bed as instructed Secondary Dressing: Woven Gauze Sponge, Non-Sterile 4x4 in (DME) (Generic) 1 x Per Day/30 Days Discharge Instructions: Apply over primary dressing as directed. Secondary Dressing: ABD Pad, 5x9 (Generic) 1 x Per Day/30 Days Discharge Instructions: Apply over primary dressing as directed. Secured With: 75M Medipore H Soft Cloth Surgical T 4 x 2 (in/yd) (Generic) 1 x Per Day/30 Days ape Discharge Instructions: Secure dressing with tape as directed. Wound #6 - Upper Leg Wound Laterality: Right Cleanser: Soap and Water 1 x Per UXL/24 Days Discharge Instructions: May shower and wash wound with dial antibacterial soap and water prior to dressing change. Peri-Wound Care: Zinc Oxide Ointment 30g tube 1 x Per Day/15 Days Discharge Instructions: As needed Apply Zinc Oxide to periwound with each dressing change Prim Dressing: KerraCel Ag Gelling Fiber Dressing, 4x5 in (silver alginate) (DME) (Generic) 1 x Per Day/15 Days ary Discharge Instructions: In clinic and patient to continue to use until interdry arrives. Apply silver alginate to wound bed as instructed Secondary Dressing: Woven Gauze Sponge, Non-Sterile 4x4 in (DME) (Generic) 1 x Per Day/15 Days Discharge Instructions: Apply over primary dressing as directed. Secondary Dressing: ABD Pad, 5x9 (Generic) 1 x Per Day/15 Days Discharge Instructions: Apply over primary dressing as directed. Secured With: 75M Medipore  H Soft Cloth Surgical T 4 x 2 (in/yd) (Generic) 1 x Per Day/15 Days ape Discharge Instructions: Secure dressing with tape as directed. Wound #8 - Upper Leg Wound Laterality: Left, Lateral Cleanser: Soap and Water 1 x Per MWN/02 Days Discharge Instructions: May shower and wash wound with dial antibacterial soap and water prior to dressing change. Cleanser: Wound Cleanser 1 x Per Day/15 Days Discharge Instructions: Cleanse the wound with wound cleanser prior to applying a clean dressing using gauze sponges, not tissue or cotton balls. Prim Dressing: Wet-to-dry 1 x Per VOZ/36 Days ary Discharge Instructions: wet to dry in clinic. Wound vac three times a week. Secondary Dressing: Woven Gauze Sponges 2x2 in (Generic) 1 x Per Day/15 Days Discharge Instructions: Apply over primary dressing as directed. Secondary Dressing: ABD Pad, 5x9 (Generic) 1 x Per Day/15 Days Discharge Instructions: Apply over primary dressing as directed. Secured With: 75M Medipore H Soft Cloth Surgical T ape, 4 x 10 (in/yd) (Generic) 1 x Per Day/15 Days Discharge Instructions: Secure with tape as directed. Wound #9 - Upper Leg Wound Laterality: Right, Proximal Cleanser: Soap and Water 1 x Per UYQ/03 Days Discharge Instructions: May shower and wash wound with dial antibacterial soap and water prior to dressing change. Peri-Wound Care: Zinc Oxide Ointment 30g tube 1 x Per Day/15 Days Discharge Instructions: As needed Apply Zinc Oxide to periwound with each dressing change Prim Dressing: KerraCel Ag Gelling Fiber Dressing, 4x5 in (silver alginate) (DME) (Generic) 1 x Per Day/15 Days ary Discharge Instructions: In clinic and patient to continue to use until interdry arrives. Apply silver alginate to wound bed as instructed Secondary Dressing: Woven Gauze Sponge, Non-Sterile 4x4 in (DME) (Generic) 1 x Per Day/15 Days Discharge Instructions: Apply over primary dressing as directed. Secondary Dressing: ABD Pad, 5x9 (Generic) 1 x  Per Day/15 Days Discharge Instructions: Apply over primary dressing as directed. Secured With: 75M Medipore H Soft Cloth Surgical T 4 x 2 (in/yd) (Generic) 1 x Per Day/15 Days ape Discharge Instructions: Secure  dressing with tape as directed. Electronic Signature(s) Signed: 10/26/2021 12:12:26 PM By: Kalman Shan DO Entered By: Kalman Shan on 10/26/2021 12:09:51 -------------------------------------------------------------------------------- Problem List Details Patient Name: Date of Service: [name], [name] 10/26/2021 10:45 A M Medical Record Number: [id number] Patient Account Number: [id number] Date of Birth/Sex: Treating RN: Mar 24, 1951 (70 y.o. Yesenia Payne, Meta.Reding Primary Care Provider: [name] Other Clinician: Referring Provider: Treating Provider/Extender: Alysia Penna in Treatment: 25 Active Problems ICD-10 Encounter Code Description Active Date MDM Diagnosis L97.829 Non-pressure chronic ulcer of other part of left lower leg with unspecified 04/30/2021 No Yes severity L97.819 Non-pressure chronic ulcer of other part of right lower leg with unspecified 04/30/2021 No Yes severity E11.9 Type 2 diabetes mellitus without complications 11/08/7508 No Yes Z89.512 Acquired absence of left leg below knee 04/30/2021 No Yes S31.109D Unspecified open wound of abdominal wall, unspecified quadrant without 09/28/2021 No Yes penetration into peritoneal cavity, subsequent encounter Inactive Problems Resolved Problems Electronic Signature(s) Signed: 10/26/2021 12:12:26 PM By: Kalman Shan DO Entered By: Kalman Shan on 10/26/2021 12:06:37 -------------------------------------------------------------------------------- Progress Note Details Patient Name: Date of Service: [name], [place] RET D. 10/26/2021 10:45 A M Medical Record Number: [id number] Patient Account Number: [id number] Date of Birth/Sex: Treating RN: 23-May-1951 (70 y.o. Yesenia Payne Primary Care Provider: [name] Other Clinician: Referring Provider: Treating Provider/Extender: Alysia Penna in Treatment: 25 Subjective Chief Complaint Information obtained from Patient Left lower extremity wounds and right upper leg wound, Pannus wounds History of Present Illness (HPI) Admission 04/30/2021 Ms. 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 She has been seen in our clinic multiple times last seen in 03/2019 for right toe wounds. Today she presents for 3 wounds. 1 is located to the right upper leg and 2 to the left lower extremity. They started at the end of December And not sure how I started. She had not been using any dressings up until 2 weeks ago. She is currently using silver alginate to the right upper leg and Santyl to the wounds on the left leg. She has been on doxycycline and 2 rounds of clindamycin for cellulitis to these wounds. She finished her last round of antibiotics 3 weeks ago. She reports minimal pain to the wound. She currently denies signs of infection including increased erythema, warmth or purulent drainage. 7/5; patient presents for 1 week follow-up. She has been using Santyl to the left lower extremity wounds and silver alginate to the right upper leg wound. She denies signs of infection. She has no complaints today. 7/19; patient presents for 1 week follow-up. She has been using Santyl to the left lower extremity wounds and silver alginate to the right upper leg wound. She denies signs of infection. She has had to use a lot of Santyl to the lateral wound since the necrotic tissue has been breaking down and the wound is deeper. 8/4; patient presents for 2-week follow-up. She has been using Santyl to the left lower extremity wound and silver alginate to the right upper leg  wound. She has been using the wound VAC to the left lateral wound. She has no issues or complaints today. She denies signs of infection. 8/18; patient presents for 2-week follow-up. She has been using Santyl and silver alginate to the right upper wound. She reports that this is deeper today. She has been using the  wound VAC to the left lateral wound. She has no issues or complaints today. She denies signs of infection. She reports that the left lower extremity medial wound is healed 9/1; patient presents for 2-week follow-up. She continues to use silver alginate to the right upper wound. She is using the wound VAC to the left lateral wound. She has no issues today. She denies signs of infection. She overall feels well. 10/10; patient presents for follow-up. Patient has not followed up in a month. She states that her husband was recently in the ICU but is now at home. She reports an increase in the wound size to the right lower extremity. She reports improvement to the left lower extremity wound. She currently denies signs of infection. She states she overall feels well. 10/24; patient presents for follow-up. She has a new wound to the right upper thigh. She has been keeping the area covered until yesterday when she started silver alginate. She continues to use Dakin wet-to-dry dressings to the other right upper leg wound. She continues to use the [facility] to the left lower extremity wound. No obvious signs of infection on exam. 11/7; patient presents for follow-up. She again has new wounds to her pannus folds. She is keeping the area dry with absorbent pads and using silver alginate to the wound beds. She continues to use the wound VAC to the left lower extremity wound without issues. She denies signs of infection. 11/21; patient presents for follow-up. She continues to have new wounds to her pannus folds. Her daughter helps with dressing changes and is keeping the areas dry with absorbent pads and using  silver alginate to the wound beds. She continues to use the wound VAC to the left lower extremity wound without issues. She currently denies signs of infection. 12/19; patient presents for follow-up. She continues to have new wounds to her pannus folds. Her daughter helps with the dressings and has been using Hibiclens and silver alginate to the wound beds. Patient continues to use the wound VAC to the left lower extremity wound. She has no issues or complaints today. She denies signs of infection. Patient History Information obtained from Patient. Family History Cancer - Child, Diabetes - Father, Heart Disease - Mother,Father, Hypertension - Mother,Father, No family history of Hereditary Spherocytosis, Kidney Disease, Lung Disease, Seizures, Stroke, Thyroid Problems, Tuberculosis. Social History Never smoker, Marital Status - Married, Alcohol Use - Never, Drug Use - No History, Caffeine Use - Daily - coffee , sodas. Medical History Eyes Patient has history of Cataracts - lens implant done, Glaucoma Denies history of Optic Neuritis Ear/Nose/Mouth/Throat Patient has history of Chronic sinus problems/congestion - seasonal Denies history of Middle ear problems Hematologic/Lymphatic Denies history of Anemia, Hemophilia, Human Immunodeficiency Virus, Lymphedema, Sickle Cell Disease Respiratory Patient has history of Asthma Denies history of Aspiration, Chronic Obstructive Pulmonary Disease (COPD), Pneumothorax, Sleep Apnea, Tuberculosis Cardiovascular Patient has history of Hypertension - not on meds presently Denies history of Angina, Arrhythmia, Congestive Heart Failure, Coronary Artery Disease, Deep Vein Thrombosis, Hypotension, Myocardial Infarction, Peripheral Arterial Disease, Peripheral Venous Disease, Phlebitis, Vasculitis Gastrointestinal Denies history of Cirrhosis , Colitis, Crohnoos, Hepatitis A, Hepatitis B, Hepatitis C Endocrine Patient has history of Type II Diabetes Denies  history of Type I Diabetes Genitourinary Denies history of End Stage Renal Disease Immunological Denies history of Lupus Erythematosus, Raynaudoos, Scleroderma Integumentary (Skin) Denies history of History of Burn Musculoskeletal Patient has history of Osteoarthritis Denies history of Gout, Rheumatoid Arthritis, Osteomyelitis Neurologic Patient has history of Neuropathy  Denies history of Dementia, Quadriplegia, Paraplegia, Seizure Disorder Oncologic Denies history of Received Chemotherapy, Received Radiation Psychiatric Denies history of Anorexia/bulimia, Confinement Anxiety Medical A Surgical History Notes nd Cardiovascular Hyperlipidemia Gastrointestinal GERD, Diverticulitis Genitourinary Overactive Bladder Musculoskeletal Fibromyalgia, toe amputations x 3 Psychiatric Anxiety Objective Constitutional respirations regular, non-labored and within target range for patient.. Vitals Time Taken: 10:33 AM, Height: 63 in, Weight: 279 lbs, BMI: 49.4, Temperature: [age] F, Pulse: 79 bpm, Respiratory Rate: 18 breaths/min, Blood Pressure: 114/75 mmHg, Capillary Blood Glucose: 211 mg/dl. Psychiatric pleasant and cooperative. General Notes: Left lower extremity: Large open wound with granulation tissue and three areas of tunneling. No obvious signs of infection. Overall appears well-healing. Several wounds to the pannus folds and thigh folds. All with granulation tissue except for one that has nonviable tissue present. Integumentary (Hair, Skin) Wound #10 status is Open. Original cause of wound was Gradually Appeared. The date acquired was: 09/14/2021. The wound has been in treatment 6 weeks. The wound is located on the Right Abdomen - Lower Quadrant. The wound measures 0.5cm length x 1.5cm width x 0.4cm depth; 0.589cm^2 area and 0.236cm^3 volume. There is Fat Layer (Subcutaneous Tissue) exposed. There is no tunneling or undermining noted. There is a medium amount of  serosanguineous drainage noted. The wound margin is distinct with the outline attached to the wound base. There is large (67-100%) red, pink granulation within the wound bed. There is a small (1-33%) amount of necrotic tissue within the wound bed including Adherent Slough. Wound #11 status is Open. Original cause of wound was Gradually Appeared. The date acquired was: 09/14/2021. The wound has been in treatment 6 weeks. The wound is located on the Left,Medial Upper Leg. The wound measures 0.5cm length x 1cm width x 0.2cm depth; 0.393cm^2 area and 0.079cm^3 volume. There is Fat Layer (Subcutaneous Tissue) exposed. There is no tunneling or undermining noted. There is a medium amount of serosanguineous drainage noted. The wound margin is distinct with the outline attached to the wound base. There is medium (34-66%) pink, pale granulation within the wound bed. There is a medium (34-66%) amount of necrotic tissue within the wound bed including Adherent Slough. Wound #12 status is Open. Original cause of wound was Gradually Appeared. The date acquired was: 10/26/2021. The wound is located on the Left Abdomen - Lower Quadrant. The wound measures 0.8cm length x 1cm width x 0.5cm depth; 0.628cm^2 area and 0.314cm^3 volume. There is Fat Layer (Subcutaneous Tissue) exposed. There is no tunneling noted. There is a medium amount of serosanguineous drainage noted. The wound margin is distinct with the outline attached to the wound base. There is medium (34-66%) red, pink granulation within the wound bed. There is a medium (34-66%) amount of necrotic tissue within the wound bed including Adherent Slough. Wound #6 status is Open. Original cause of wound was Gradually Appeared. The date acquired was: 11/06/2020. The wound has been in treatment 25 weeks. The wound is located on the Right Upper Leg. The wound measures 1.5cm length x 1.5cm width x 0.6cm depth; 1.767cm^2 area and 1.06cm^3 volume. There is Fat Layer  (Subcutaneous Tissue) exposed. There is no tunneling or undermining noted. There is a medium amount of serous drainage noted. The wound margin is distinct with the outline attached to the wound base. There is large (67-100%) pink, pale, friable granulation within the wound bed. There is a small (1-33%) amount of necrotic tissue within the wound bed including Adherent Slough. Wound #8 status is Open. Original cause of wound was Gradually  Appeared. The date acquired was: 01/06/2021. The wound has been in treatment 25 weeks. The wound is located on the Left,Lateral Upper Leg. The wound measures 11.2cm length x 5.5cm width x 3.4cm depth; 48.381cm^2 area and 164.494cm^3 volume. There is Fat Layer (Subcutaneous Tissue) exposed. There is no tunneling or undermining noted. There is a large amount of serosanguineous drainage noted. Foul odor after cleansing was noted. Foul odor after cleansing was noted due to product use. The wound margin is distinct with the outline attached to the wound base. There is large (67-100%) red, friable granulation within the wound bed. There is no necrotic tissue within the wound bed. Wound #9 status is Open. Original cause of wound was Other Lesion. The date acquired was: 08/19/2021. The wound has been in treatment 8 weeks. The wound is located on the Right,Proximal Upper Leg. The wound measures 0.1cm length x 0.1cm width x 0.1cm depth; 0.008cm^2 area and 0.001cm^3 volume. There is Fat Layer (Subcutaneous Tissue) exposed. There is no tunneling or undermining noted. There is a none present amount of drainage noted. The wound margin is distinct with the outline attached to the wound base. There is no granulation within the wound bed. There is no necrotic tissue within the wound bed. 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 I recommended continuing with the wound VAC. I recommended continuing silver alginate and keeping the areas clean to the pannus wounds. T the wound that has nonviable tissue I recommended Santyl. No surrounding signs of infection. Follow-up in 3 weeks. o Procedures Wound #12 Pre-procedure diagnosis of Wound #12 is a Pressure Ulcer located on the Left Abdomen - Lower Quadrant . There was a Chemical/Enzymatic/Mechanical debridement performed by Yesenia Pilling, RN.Marland Kitchen Agent used was [employer]. There was no bleeding. The procedure was tolerated well. Post Debridement Measurements: 0.8cm length x 1cm width x 0.5cm depth; 0.314cm^3 volume. Post debridement Stage noted as Category/Stage III. Character of Wound/Ulcer Post Debridement requires further debridement. Post procedure Diagnosis Wound #12: Same as Pre-Procedure Plan Follow-up Appointments: Return appointment in 3 weeks. - Dr. Heber [place] - ***STRETCHER - 60 minutes extra time*** Bathing/ Shower/ Hygiene: Other Bathing/Shower/Hygiene Orders/Instructions: - You may sponge bathe use hibiclens wash with dressing changes with abdominal skin folds. Negative Presssure Wound Therapy: Wound #8 Left,Lateral Upper Leg: Wound Vac to wound continuously at 132m/hg pressure - Change three times a week. Black Foam White Foam - White foam to deep areas at 7:00, 9:00, and 11:00 Off-Loading: Turn and reposition every 2 hours WOUND #10: - Abdomen - Lower Quadrant Wound Laterality: Right Cleanser: Soap and Water 1 x Per Day/30 Days Discharge Instructions: May shower and wash wound with dial antibacterial soap and water prior to dressing change. Peri-Wound Care: Zinc Oxide Ointment 30g tube 1  x Per Day/30 Days Discharge Instructions: As needed Apply Zinc Oxide to periwound with each dressing change Prim Dressing: KerraCel Ag Gelling Fiber Dressing, 4x5 in (silver alginate) (DME) (Generic) 1 x Per Day/30 Days ary Discharge Instructions: In clinic and patient to continue to use until interdry arrives. Apply silver alginate to wound bed as instructed Secondary Dressing: Woven Gauze Sponge, Non-Sterile 4x4 in (DME) (Generic) 1 x Per  Day/30 Days Discharge Instructions: Apply over primary dressing as directed. Secondary Dressing: ABD Pad, 5x9 (Generic) 1 x Per Day/30 Days Discharge Instructions: Apply over primary dressing as directed. Secured With: 350M Medipore H Soft Cloth Surgical T 4 x 2 (in/yd) (Generic) 1 x Per Day/30 Days ape Discharge Instructions: Secure dressing with tape as directed. WOUND #11: - Upper Leg Wound Laterality: Left, Medial Cleanser: Soap and Water 1 x Per Day/30 Days Discharge Instructions: May shower and wash wound with dial antibacterial soap and water prior to dressing change. Peri-Wound Care: Zinc Oxide Ointment 30g tube 1 x Per Day/30 Days Discharge Instructions: As needed Apply Zinc Oxide to periwound with each dressing change Prim Dressing: KerraCel Ag Gelling Fiber Dressing, 4x5 in (silver alginate) (DME) (Generic) 1 x Per Day/30 Days ary Discharge Instructions: In clinic and patient to continue to use until interdry arrives. Apply silver alginate to wound bed as instructed Secondary Dressing: Woven Gauze Sponge, Non-Sterile 4x4 in (DME) (Generic) 1 x Per Day/30 Days Discharge Instructions: Apply over primary dressing as directed. Secondary Dressing: ABD Pad, 5x9 (Generic) 1 x Per Day/30 Days Discharge Instructions: Apply over primary dressing as directed. Secured With: 350M Medipore H Soft Cloth Surgical T 4 x 2 (in/yd) (Generic) 1 x Per Day/30 Days ape Discharge Instructions: Secure dressing with tape as directed. WOUND #12: - Abdomen - Lower Quadrant  Wound Laterality: Left Cleanser: Soap and Water 1 x Per Day/30 Days Discharge Instructions: May shower and wash wound with dial antibacterial soap and water prior to dressing change. Peri-Wound Care: Zinc Oxide Ointment 30g tube 1 x Per Day/30 Days Discharge Instructions: As needed Apply Zinc Oxide to periwound with each dressing change Prim Dressing: KerraCel Ag Gelling Fiber Dressing, 4x5 in (silver alginate) (DME) (Generic) 1 x Per Day/30 Days ary Discharge Instructions: In clinic and patient to continue to use until interdry arrives. Apply silver alginate to wound bed as instructed Secondary Dressing: Woven Gauze Sponge, Non-Sterile 4x4 in (DME) (Generic) 1 x Per Day/30 Days Discharge Instructions: Apply over primary dressing as directed. Secondary Dressing: ABD Pad, 5x9 (Generic) 1 x Per Day/30 Days Discharge Instructions: Apply over primary dressing as directed. Secured With: 350M Medipore H Soft Cloth Surgical T 4 x 2 (in/yd) (Generic) 1 x Per Day/30 Days ape Discharge Instructions: Secure dressing with tape as directed. WOUND #6: - Upper Leg Wound Laterality: Right Cleanser: Soap and Water 1 x Per DJS/97 Days Discharge Instructions: May shower and wash wound with dial antibacterial soap and water prior to dressing change. Peri-Wound Care: Zinc Oxide Ointment 30g tube 1 x Per Day/15 Days Discharge Instructions: As needed Apply Zinc Oxide to periwound with each dressing change Prim Dressing: KerraCel Ag Gelling Fiber Dressing, 4x5 in (silver alginate) (DME) (Generic) 1 x Per Day/15 Days ary Discharge Instructions: In clinic and patient to continue to use until interdry arrives. Apply silver alginate to wound bed as instructed Secondary Dressing: Woven Gauze Sponge, Non-Sterile 4x4 in (DME) (Generic) 1 x Per Day/15 Days Discharge Instructions: Apply over primary dressing as directed. Secondary Dressing: ABD Pad, 5x9 (Generic) 1 x Per Day/15 Days Discharge Instructions: Apply over primary  dressing as directed. Secured With: 350M Medipore H Soft Cloth Surgical T 4 x 2 (in/yd) (Generic) 1 x Per Day/15 Days ape Discharge Instructions: Secure dressing with tape as directed. WOUND #8: - Upper Leg Wound Laterality: Left, Lateral Cleanser: Soap and Water 1 x Per Day/15 Days Discharge Instructions: May shower and wash wound with dial antibacterial soap and water  prior to dressing change. Cleanser: Wound Cleanser 1 x Per Day/15 Days Discharge Instructions: Cleanse the wound with wound cleanser prior to applying a clean dressing using gauze sponges, not tissue or cotton balls. Prim Dressing: Wet-to-dry 1 x Per TIR/44 Days ary Discharge Instructions: wet to dry in clinic. Wound vac three times a week. Secondary Dressing: Woven Gauze Sponges 2x2 in (Generic) 1 x Per Day/15 Days Discharge Instructions: Apply over primary dressing as directed. Secondary Dressing: ABD Pad, 5x9 (Generic) 1 x Per Day/15 Days Discharge Instructions: Apply over primary dressing as directed. Secured With: 58M Medipore H Soft Cloth Surgical T ape, 4 x 10 (in/yd) (Generic) 1 x Per Day/15 Days Discharge Instructions: Secure with tape as directed. WOUND #9: - Upper Leg Wound Laterality: Right, Proximal Cleanser: Soap and Water 1 x Per Day/15 Days Discharge Instructions: May shower and wash wound with dial antibacterial soap and water prior to dressing change. Peri-Wound Care: Zinc Oxide Ointment 30g tube 1 x Per Day/15 Days Discharge Instructions: As needed Apply Zinc Oxide to periwound with each dressing change Prim Dressing: KerraCel Ag Gelling Fiber Dressing, 4x5 in (silver alginate) (DME) (Generic) 1 x Per Day/15 Days ary Discharge Instructions: In clinic and patient to continue to use until interdry arrives. Apply silver alginate to wound bed as instructed Secondary Dressing: Woven Gauze Sponge, Non-Sterile 4x4 in (DME) (Generic) 1 x Per Day/15 Days Discharge Instructions: Apply over primary dressing as  directed. Secondary Dressing: ABD Pad, 5x9 (Generic) 1 x Per Day/15 Days Discharge Instructions: Apply over primary dressing as directed. Secured With: 58M Medipore H Soft Cloth Surgical T 4 x 2 (in/yd) (Generic) 1 x Per Day/15 Days ape Discharge Instructions: Secure dressing with tape as directed. 1. Silver alginate, Hibiclens, zinc oxide to the pannus wounds 2. Wound VAC to the left lateral leg wound 3. Follow-up in 3 weeks Electronic Signature(s) Signed: 10/26/2021 12:12:26 PM By: Kalman Shan DO Entered By: Kalman Shan on 10/26/2021 12:11:39 -------------------------------------------------------------------------------- HxROS Details Patient Name: Date of Service: [name], [name] 10/26/2021 10:45 A M Medical Record Number: [id number] Patient Account Number: [id number] Date of Birth/Sex: Treating RN: 08/27/1951 (70 y.o. Yesenia Payne Primary Care Provider: [name] Other Clinician: Referring Provider: Treating Provider/Extender: Alysia Penna in Treatment: 25 Information Obtained From Patient Eyes Medical History: Positive for: Cataracts - lens implant done; Glaucoma Negative for: Optic Neuritis Ear/Nose/Mouth/Throat Medical History: Positive for: Chronic sinus problems/congestion - seasonal Negative for: Middle ear problems Hematologic/Lymphatic Medical History: Negative for: Anemia; Hemophilia; Human Immunodeficiency Virus; Lymphedema; Sickle Cell Disease Respiratory Medical History: Positive for: Asthma Negative for: Aspiration; Chronic Obstructive Pulmonary Disease (COPD); Pneumothorax; Sleep Apnea; Tuberculosis Cardiovascular Medical History: Positive for: Hypertension - not on meds presently Negative for: Angina; Arrhythmia; Congestive Heart Failure; Coronary Artery Disease; Deep Vein Thrombosis; Hypotension; Myocardial Infarction; Peripheral Arterial Disease; Peripheral Venous Disease; Phlebitis;  Vasculitis Past Medical History Notes: Hyperlipidemia Gastrointestinal Medical History: Negative for: Cirrhosis ; Colitis; Crohns; Hepatitis A; Hepatitis B; Hepatitis C Past Medical History Notes: GERD, Diverticulitis Endocrine Medical History: Positive for: Type II Diabetes Negative for: Type I Diabetes Time with diabetes: 12 years Treated with: Insulin Blood sugar tested every day: Yes T ested : bid Blood sugar testing results: Breakfast: 140 max; Bedtime: 220's max Genitourinary Medical History: Negative for: End Stage Renal Disease Past Medical History Notes: Overactive Bladder Immunological Medical History: Negative for: Lupus Erythematosus; Raynauds; Scleroderma Integumentary (Skin) Medical History: Negative for: History of Burn Musculoskeletal Medical History: Positive for: Osteoarthritis  Negative for: Gout; Rheumatoid Arthritis; Osteomyelitis Past Medical History Notes: Fibromyalgia, toe amputations x 3 Neurologic Medical History: Positive for: Neuropathy Negative for: Dementia; Quadriplegia; Paraplegia; Seizure Disorder Oncologic Medical History: Negative for: Received Chemotherapy; Received Radiation Psychiatric Medical History: Negative for: Anorexia/bulimia; Confinement Anxiety Past Medical History Notes: Anxiety HBO Extended History Items Ear/Nose/Mouth/Throat: Eyes: Eyes: Chronic sinus Cataracts Glaucoma problems/congestion Immunizations Pneumococcal Vaccine: Received Pneumococcal Vaccination: Yes Received Pneumococcal Vaccination On or After 60th Birthday: No Immunization Notes: up to date on pneumonia and tetanus shots Implantable Devices None Family and Social History Cancer: Yes - Child; Diabetes: Yes - Father; Heart Disease: Yes - Mother,Father; Hereditary Spherocytosis: No; Hypertension: Yes - Mother,Father; Kidney Disease: No; Lung Disease: No; Seizures: No; Stroke: No; Thyroid Problems: No; Tuberculosis: No; Never smoker; Marital  Status - Married; Alcohol Use: Never; Drug Use: No History; Caffeine Use: Daily - coffee , sodas; Financial Concerns: No; Food, Clothing or Shelter Needs: No; Support System Lacking: No; Transportation Concerns: No Electronic Signature(s) Signed: 10/26/2021 12:12:26 PM By: Kalman Shan DO Signed: 10/26/2021 3:53:37 PM By: Lorrin Jackson Entered By: Kalman Shan on 10/26/2021 12:08:15 -------------------------------------------------------------------------------- SuperBill Details Patient Name: Date of Service: [name], [name] 10/26/2021 Medical Record Number: [id number] Patient Account Number: [id number] Date of Birth/Sex: Treating RN: 1950/11/25 (70 y.o. Yesenia Payne, Meta.Reding Primary Care Provider: [name] Other Clinician: Referring Provider: Treating Provider/Extender: Alysia Penna in Treatment: 25 Diagnosis Coding ICD-10 Codes Code Description [phone] 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ity L97.819 Non-pressure chronic ulcer of other part of right lower leg with unspecified severity E11.9 Type 2 diabetes mellitus without complications J09.326 Acquired absence of left leg below knee S31.109D Unspecified open wound of abdominal wall, unspecified quadrant without penetration into peritoneal cavity, subsequent encounter Facility Procedures CPT4 Code: [id number] Description: [phone] - DEBRIDE W/O ANES NON SELECT Modifier: Quantity: 1 Physician Procedures : CPT4 Code Description Modifier [id number] - WC PHYS LEVEL 3 - EST PT ICD-10 Diagnosis Description 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ity L97.819 Non-pressure chronic ulcer of other part of right  lower leg with unspecified severity S31.109D Unspecified open wound of abdominal wall, unspecified quadrant without penetration into peritoneal cav subsequent encounter E11.9 Type 2 diabetes mellitus  without complications Quantity: 1 ity, Electronic Signature(s) Signed: 10/26/2021 12:12:26 PM By: Kalman Shan DO Entered By: Kalman Shan on 10/26/2021 12:12:01

## 2021-10-27 NOTE — Progress Notes (Signed)
Yesenia Payne, Yesenia Payne ([id number]) Visit Report for 10/26/2021 Arrival Information Details Patient Name: Date of Service: Yesenia Payne, [name] 10/26/2021 10:45 A M Medical Record Number: [id number] Patient Account Number: [id number] Date of Birth/Sex: Treating RN: 16-Jun-1951 (70 y.o. [name] Primary Care Tykesha Konicki: [name] Other Clinician: Referring Geralene Afshar: Treating Jo-Ann Johanning/Extender: Alysia Penna in Treatment: 25 Visit Information History Since Last Visit Added or deleted any medications: No Patient Arrived: Stretcher Any new allergies or adverse reactions: No Arrival Time: 10:33 Had a fall or experienced change in No Accompanied By: daughter activities of daily living that may affect Transfer Assistance: Stretcher risk of falls: Patient Identification Verified: Yes Signs or symptoms of abuse/neglect since last visito No Secondary Verification Process Completed: Yes Hospitalized since last visit: No Patient Requires Transmission-Based Precautions: No Implantable device outside of the clinic excluding No Patient Has Alerts: No cellular tissue based products placed in the center since last visit: Has Dressing in Place as Prescribed: Yes Pain Present Now: Yes Electronic Signature(s) Signed: 10/27/2021 9:12:23 AM By: Sandre Kitty Entered By: Sandre Kitty on 10/26/2021 10:33:53 -------------------------------------------------------------------------------- Encounter Discharge Information Details Patient Name: Date of Service: Yesenia [name], [name] 10/26/2021 10:45 A M Medical Record Number: [id number] Patient Account Number: [id number] Date of Birth/Sex: Treating RN: 1951/07/21 (70 y.o. Yesenia Payne Primary Care Dusten Ellinwood: [name] Other Clinician: Referring Mylin Hirano: Treating Carnie Bruemmer/Extender: Alysia Penna in Treatment: 25 Encounter Discharge Information Items Post Procedure  Vitals Discharge Condition: Stable Temperature (F): 97.9 Ambulatory Status: Stretcher Pulse (bpm): 79 Discharge Destination: Home Respiratory Rate (breaths/min): 18 Transportation: Ambulance Blood Pressure (mmHg): 114/75 Accompanied By: daughter Schedule Follow-up Appointment: Yes Clinical Summary of Care: Patient Declined Electronic Signature(s) Signed: 10/26/2021 4:19:04 PM By: Dellie Catholic RN Entered By: Dellie Catholic on 10/26/2021 11:39:30 -------------------------------------------------------------------------------- Lower Extremity Assessment Details Patient Name: Date of Service: Yesenia Payne, [name] 10/26/2021 10:45 A M Medical Record Number: [id number] Patient Account Number: [id number] Date of Birth/Sex: Treating RN: 01-13-1951 (70 y.o. Yesenia Payne Primary Care Oleva Koo: [name] Other Clinician: Referring Lesly Joslyn: Treating Karoline Fleer/Extender: Alysia Penna in Treatment: 25 Electronic Signature(s) Signed: 10/26/2021 5:04:55 PM By: Deon Pilling RN, BSN Entered By: Deon Pilling on 10/26/2021 10:57:15 -------------------------------------------------------------------------------- Multi Wound Chart Details Patient Name: Date of Service: Yesenia [name], [name] 10/26/2021 10:45 A M Medical Record Number: [id number] Patient Account Number: [id number] Date of Birth/Sex: Treating RN: Jul 02, 1951 (70 y.o. [name] Primary Care Kaulin Chaves: [name] Other Clinician: Referring Kydan Shanholtzer: Treating Blade Scheff/Extender: Alysia Penna in Treatment: 25 Vital Signs Height(in): 63 Capillary Blood Glucose(mg/dl): 211 Weight(lbs): 279 Pulse(bpm): 2 Body Mass Index(BMI): 69 Blood Pressure(mmHg): 114/75 Temperature(F): 97.5 Respiratory Rate(breaths/min): 18 Photos: [10:No Photos Right Abdomen - Lower Quadrant] [11:No Photos Left, Medial Upper Leg] [12:No Photos Left Abdomen - Lower  Quadrant] Wound Location: [10:Gradually Appeared] [11:Gradually Appeared] [12:Gradually Appeared] Wounding Event: [10:Pressure Ulcer] [11:Pressure Ulcer] [12:Pressure Ulcer] Primary Etiology: [10:Cataracts, Glaucoma, Chronic sinus] [11:Cataracts, Glaucoma, Chronic sinus] [12:Cataracts, Glaucoma, Chronic sinus] Comorbid History: [10:problems/congestion, Asthma, Hypertension, Type II Diabetes, Osteoarthritis, Neuropathy 09/14/2021] [11:problems/congestion, Asthma, Hypertension, Type II Diabetes, Osteoarthritis, Neuropathy 09/14/2021] [12:problems/congestion,  Asthma, Hypertension, Type II Diabetes, Osteoarthritis, Neuropathy 10/26/2021] Date Acquired: [10:6] [11:6] [12:0] Weeks of Treatment: [10:Open] [11:Open] [12:Open] Wound Status: [10:0.5x1.5x0.4] [11:0.5x1x0.2] [12:0.8x1x0.5] Measurements L x W x D (cm) [10:0.589] [11:0.393] [12:0.628] A (cm) : rea [10:0.236] [11:0.079] [12:0.314] Volume (cm) : [10:-33.90%] [11:-233.10%] [12:N/A] % Reduction in A rea: [  10:-168.20%] [11:-229.20%] [12:N/A] % Reduction in Volume: [10:Category/Stage III] [11:Category/Stage III] [12:Category/Stage III] Classification: [10:Medium] [11:Medium] [12:Medium] Exudate A mount: [10:Serosanguineous] [11:Serosanguineous] [12:Serosanguineous] Exudate Type: [10:red, Payne] [11:red, Payne] [12:red, Payne] Exudate Color: [10:No] [11:No] [12:No] Foul Odor A Cleansing: [10:fter N/A] [11:N/A] [12:N/A] Odor A nticipated Due to Product Use: [10:Distinct, outline attached] [11:Distinct, outline attached] [12:Distinct, outline attached] Wound Margin: [10:Large (67-100%)] [11:Medium (34-66%)] [12:Medium (34-66%)] Granulation A mount: [10:Red, Pink] [11:Pink, Pale] [12:Red, Pink] Granulation Quality: [10:Small (1-33%)] [11:Medium (34-66%)] [12:Medium (34-66%)] Necrotic A mount: [10:Fat Layer (Subcutaneous Tissue): Yes Fat Layer (Subcutaneous Tissue): Yes Fat Layer (Subcutaneous Tissue): Yes] Exposed Structures: [10:Fascia: No  Tendon: No Muscle: No Joint: No Bone: No Small (1-33%)] [11:Fascia: No Tendon: No Muscle: No Joint: No Bone: No None] [12:Fascia: No Tendon: No Muscle: No Joint: No Bone: No None] Epithelialization: [10:N/A] [11:N/A] [12:Chemical/Enzymatic/Mechanical] Debridement: [10:N/A] [11:N/A] [12:N/A] Instrument: [10:N/A] [11:N/A] [12:None] Bleeding: [10:N/A] [11:N/A] [12:Procedure was tolerated well] Debridement Treatment Response: [10:N/A] [11:N/A] [12:0.8x1x0.5] Post Debridement Measurements L x W x D (cm) [10:N/A] [11:N/A] [12:0.314] Post Debridement Volume: (cm) [10:N/A] [11:N/A] [12:Category/Stage III] Post Debridement Stage: [10:N/A] [11:N/A] [12:Debridement] Wound Number: _0 Photos: No Photos No Photos No Photos Right Upper Leg Left, Lateral Upper Leg Right, Proximal Upper Leg Wound Location: Gradually Appeared Gradually Appeared Other Lesion Wounding Event: Diabetic Wound/Ulcer of the Lower Diabetic Wound/Ulcer of the Lower Lesion Primary Etiology: Extremity Extremity Cataracts, Glaucoma, Chronic sinus Cataracts, Glaucoma, Chronic sinus Cataracts, Glaucoma, Chronic sinus Comorbid History: problems/congestion, Asthma, problems/congestion, Asthma, problems/congestion, Asthma, Hypertension, Type II Diabetes, Hypertension, Type II Diabetes, Hypertension, Type II Diabetes, Osteoarthritis, Neuropathy Osteoarthritis, Neuropathy Osteoarthritis, Neuropathy 11/06/2020 01/06/2021 08/19/2021 Date Acquired: _1 Weeks of Treatment: Open Open Open Wound Status: 1.5x1.5x0.6 11.2x5.5x3.4 0.1x0.1x0.1 Measurements L x W x D (cm) 1.767 48.381 0.008 A (cm) : rea 1.06 164.494 0.001 Volume (cm) : 93.80% 57.20% 99.50% % Reduction in Area: 96.30% 41.80% 99.70% % Reduction in Volume: Grade 2 Grade 2 Full Thickness Without Exposed Classification: Support Structures Medium Large None Present Exudate Amount: Serous Serosanguineous N/A Exudate Type: amber red, Payne N/A Exudate Color: No  Yes No Foul Odor A Cleansing: fter N/A Yes N/A Odor Anticipated Due to Product Use: Distinct, outline attached Distinct, outline attached Distinct, outline attached Wound Margin: Large (67-100%) Large (67-100%) None Present (0%) Granulation A mount: Pink, Pale, Friable Red, Friable N/A Granulation Quality: Small (1-33%) None Present (0%) None Present (0%) Necrotic Amount: Fat Layer (Subcutaneous Tissue): Yes Fat Layer (Subcutaneous Tissue): Yes Fat Layer (Subcutaneous Tissue): Yes Exposed Structures: Fascia: No Fascia: No Fascia: No Tendon: No Tendon: No Tendon: No Muscle: No Muscle: No Muscle: No Joint: No Joint: No Joint: No Bone: No Bone: No Bone: No Small (1-33%) Medium (34-66%) Large (67-100%) Epithelialization: N/A N/A N/A Debridement: N/A N/A N/A Instrument: N/A N/A N/A Bleeding: Debridement Treatment Response: N/A N/A N/A Post Debridement Measurements L x N/A N/A N/A W x D (cm) N/A N/A N/A Post Debridement Volume: (cm) N/A N/A N/A Post Debridement Stage: N/A N/A N/A Procedures Performed: Treatment Notes Wound #10 (Abdomen - Lower Quadrant) Wound Laterality: Right Cleanser Soap and Water Discharge Instruction: May shower and wash wound with dial antibacterial soap and water prior to dressing change. Peri-Wound Care Zinc Oxide Ointment 30g tube Discharge Instruction: As needed Apply Zinc Oxide to periwound with each dressing change Topical Primary Dressing KerraCel Ag Gelling Fiber Dressing, 4x5 in (silver alginate) Discharge Instruction: In clinic and patient to continue to use until interdry arrives. Apply silver alginate to wound bed  as instructed Secondary Dressing Woven Gauze Sponge, Non-Sterile 4x4 in Discharge Instruction: Apply over primary dressing as directed. ABD Pad, 5x9 Discharge Instruction: Apply over primary dressing as directed. Secured With 55M Medipore H Soft Cloth Surgical T 4 x 2 (in/yd) ape Discharge Instruction: Secure  dressing with tape as directed. Compression Wrap Compression Stockings Add-Ons Wound #11 (Upper Leg) Wound Laterality: Left, Medial Cleanser Soap and Water Discharge Instruction: May shower and wash wound with dial antibacterial soap and water prior to dressing change. Peri-Wound Care Zinc Oxide Ointment 30g tube Discharge Instruction: As needed Apply Zinc Oxide to periwound with each dressing change Topical Primary Dressing KerraCel Ag Gelling Fiber Dressing, 4x5 in (silver alginate) Discharge Instruction: In clinic and patient to continue to use until interdry arrives. Apply silver alginate to wound bed as instructed Secondary Dressing Woven Gauze Sponge, Non-Sterile 4x4 in Discharge Instruction: Apply over primary dressing as directed. ABD Pad, 5x9 Discharge Instruction: Apply over primary dressing as directed. Secured With 55M Medipore H Soft Cloth Surgical T 4 x 2 (in/yd) ape Discharge Instruction: Secure dressing with tape as directed. Compression Wrap Compression Stockings Add-Ons Wound #12 (Abdomen - Lower Quadrant) Wound Laterality: Left Cleanser Soap and Water Discharge Instruction: May shower and wash wound with dial antibacterial soap and water prior to dressing change. Peri-Wound Care Zinc Oxide Ointment 30g tube Discharge Instruction: As needed Apply Zinc Oxide to periwound with each dressing change Topical Primary Dressing KerraCel Ag Gelling Fiber Dressing, 4x5 in (silver alginate) Discharge Instruction: In clinic and patient to continue to use until interdry arrives. Apply silver alginate to wound bed as instructed Secondary Dressing Woven Gauze Sponge, Non-Sterile 4x4 in Discharge Instruction: Apply over primary dressing as directed. ABD Pad, 5x9 Discharge Instruction: Apply over primary dressing as directed. Secured With 55M Medipore H Soft Cloth Surgical T 4 x 2 (in/yd) ape Discharge Instruction: Secure dressing with tape as directed. Compression  Wrap Compression Stockings Add-Ons Wound #6 (Upper Leg) Wound Laterality: Right Cleanser Soap and Water Discharge Instruction: May shower and wash wound with dial antibacterial soap and water prior to dressing change. Peri-Wound Care Zinc Oxide Ointment 30g tube Discharge Instruction: As needed Apply Zinc Oxide to periwound with each dressing change Topical Primary Dressing KerraCel Ag Gelling Fiber Dressing, 4x5 in (silver alginate) Discharge Instruction: In clinic and patient to continue to use until interdry arrives. Apply silver alginate to wound bed as instructed Secondary Dressing Woven Gauze Sponge, Non-Sterile 4x4 in Discharge Instruction: Apply over primary dressing as directed. ABD Pad, 5x9 Discharge Instruction: Apply over primary dressing as directed. Secured With 55M Medipore H Soft Cloth Surgical T 4 x 2 (in/yd) ape Discharge Instruction: Secure dressing with tape as directed. Compression Wrap Compression Stockings Add-Ons Wound #8 (Upper Leg) Wound Laterality: Left, Lateral Cleanser Soap and Water Discharge Instruction: May shower and wash wound with dial antibacterial soap and water prior to dressing change. Wound Cleanser Discharge Instruction: Cleanse the wound with wound cleanser prior to applying a clean dressing using gauze sponges, not tissue or cotton balls. Peri-Wound Care Topical Primary Dressing Wet-to-dry Discharge Instruction: wet to dry in clinic. Wound vac three times a week. Secondary Dressing Woven Gauze Sponges 2x2 in Discharge Instruction: Apply over primary dressing as directed. ABD Pad, 5x9 Discharge Instruction: Apply over primary dressing as directed. Secured With 55M Medipore H Soft Cloth Surgical T ape, 4 x 10 (in/yd) Discharge Instruction: Secure with tape as directed. Compression Wrap Compression Stockings Add-Ons Wound #9 (Upper Leg) Wound Laterality: Right, Proximal Cleanser Soap  and Water Discharge Instruction: May shower  and wash wound with dial antibacterial soap and water prior to dressing change. Peri-Wound Care Zinc Oxide Ointment 30g tube Discharge Instruction: As needed Apply Zinc Oxide to periwound with each dressing change Topical Primary Dressing KerraCel Ag Gelling Fiber Dressing, 4x5 in (silver alginate) Discharge Instruction: In clinic and patient to continue to use until interdry arrives. Apply silver alginate to wound bed as instructed Secondary Dressing Woven Gauze Sponge, Non-Sterile 4x4 in Discharge Instruction: Apply over primary dressing as directed. ABD Pad, 5x9 Discharge Instruction: Apply over primary dressing as directed. Secured With 17M Medipore H Soft Cloth Surgical T 4 x 2 (in/yd) ape Discharge Instruction: Secure dressing with tape as directed. Compression Wrap Compression Stockings Add-Ons Electronic Signature(s) Signed: 10/26/2021 12:12:26 PM By: Kalman Shan DO Signed: 10/26/2021 3:53:37 PM By: Lorrin Jackson Entered By: Kalman Shan on 10/26/2021 12:06:51 -------------------------------------------------------------------------------- Multi-Disciplinary Care Plan Details Patient Name: Date of Service: Yesenia Payne, [name] 10/26/2021 10:45 A M Medical Record Number: [id number] Patient Account Number: [id number] Date of Birth/Sex: Treating RN: 12/29/1950 (70 y.o. [name], Tammi Klippel Primary Care Dareion Kneece: [name] Other Clinician: Referring Mohamad Bruso: Treating Aarushi Hemric/Extender: Alysia Penna in Treatment: 25 Active Inactive Wound/Skin Impairment Nursing Diagnoses: Impaired tissue integrity Knowledge deficit related to ulceration/compromised skin integrity Goals: Patient will demonstrate a reduced rate of smoking or cessation of smoking Date Initiated: 04/30/2021 Target Resolution Date: 11/26/2021 Goal Status: Active Patient/caregiver will verbalize understanding of skin care regimen Date Initiated: 04/30/2021 Date  Inactivated: 07/09/2021 Target Resolution Date: 07/10/2021 Goal Status: Met Ulcer/skin breakdown will have a volume reduction of 50% by week 8 Date Initiated: 07/09/2021 Date Inactivated: 08/31/2021 Target Resolution Date: 08/31/2021 Goal Status: Met Interventions: Assess patient/caregiver ability to obtain necessary supplies Assess patient/caregiver ability to perform ulcer/skin care regimen upon admission and as needed Assess ulceration(s) every visit Notes: Electronic Signature(s) Signed: 10/26/2021 5:04:55 PM By: Deon Pilling RN, BSN Entered By: Deon Pilling on 10/26/2021 11:09:50 -------------------------------------------------------------------------------- Pain Assessment Details Patient Name: Date of Service: Yesenia [name], [name] 10/26/2021 10:45 A M Medical Record Number: [id number] Patient Account Number: [id number] Date of Birth/Sex: Treating RN: 1950-11-10 (70 y.o. [name] Primary Care Keileigh Vahey: [name] Other Clinician: Referring Chamika Cunanan: Treating Ezeriah Luty/Extender: Alysia Penna in Treatment: 25 Active Problems Location of Pain Severity and Description of Pain Patient Has Paino Yes Site Locations Rate the pain. Current Pain Level: 4 Pain Management and Medication Current Pain Management: Electronic Signature(s) Signed: 10/26/2021 3:53:37 PM By: Lorrin Jackson Signed: 10/27/2021 9:12:23 AM By: Sandre Kitty Entered By: Sandre Kitty on 10/26/2021 10:34:49 -------------------------------------------------------------------------------- Patient/Caregiver Education Details Patient Name: Date of Service: Yesenia [name], [name] 12/19/2022andnbsp10:45 A M Medical Record Number: [id number] Patient Account Number: [id number] Date of Birth/Gender: Treating RN: 08-Sep-1951 (70 y.o. Yesenia Payne Primary Care Physician: [name] Other Clinician: Referring Physician: Treating Physician/Extender: Alysia Penna in Treatment: 25 Education Assessment Education Provided To: Patient Education Topics Provided Wound/Skin Impairment: Handouts: Skin Care Do's and Dont's Methods: Explain/Verbal Responses: Reinforcements needed Electronic Signature(s) Signed: 10/26/2021 5:04:55 PM By: Deon Pilling RN, BSN Entered By: Deon Pilling on 10/26/2021 11:10:31 -------------------------------------------------------------------------------- Wound Assessment Details Patient Name: Date of Service: Yesenia [name], [name] 10/26/2021 10:45 A M Medical Record Number: [id number] Patient Account Number: [id number] Date of Birth/Sex: Treating RN: Feb 22, 1951 (70 y.o. Yesenia Payne Primary Care Jeanne Terrance: [name] Other Clinician: Referring Kearie Mennen: Treating Emmi Wertheim/Extender: Heber Woodfin  Katrine Coho, Jerre Simon in Treatment: 25 Wound Status Wound Number: 10 Primary Pressure Ulcer Etiology: Wound Location: Right Abdomen - Lower Quadrant Wound Open Wounding Event: Gradually Appeared Status: Date Acquired: 09/14/2021 Comorbid Cataracts, Glaucoma, Chronic sinus problems/congestion, Asthma, Weeks Of Treatment: 6 History: Hypertension, Type II Diabetes, Osteoarthritis, Neuropathy Clustered Wound: No Photos Photo Uploaded By: Valeria Batman on 10/27/2021 08:44:12 Wound Measurements Length: (cm) 0.5 Width: (cm) 1.5 Depth: (cm) 0.4 Area: (cm) 0.589 Volume: (cm) 0.236 % Reduction in Area: -33.9% % Reduction in Volume: -168.2% Epithelialization: Small (1-33%) Tunneling: No Undermining: No Wound Description Classification: Category/Stage III Wound Margin: Distinct, outline attached Exudate Amount: Medium Exudate Type: Serosanguineous Exudate Color: red, Payne Foul Odor After Cleansing: No Slough/Fibrino Yes Wound Bed Granulation Amount: Large (67-100%) Exposed Structure Granulation Quality: Red, Pink Fascia Exposed: No Necrotic Amount: Small (1-33%) Fat  Layer (Subcutaneous Tissue) Exposed: Yes Necrotic Quality: Adherent Slough Tendon Exposed: No Muscle Exposed: No Joint Exposed: No Bone Exposed: No Treatment Notes Wound #10 (Abdomen - Lower Quadrant) Wound Laterality: Right Cleanser Soap and Water Discharge Instruction: May shower and wash wound with dial antibacterial soap and water prior to dressing change. Peri-Wound Care Zinc Oxide Ointment 30g tube Discharge Instruction: As needed Apply Zinc Oxide to periwound with each dressing change Topical Primary Dressing KerraCel Ag Gelling Fiber Dressing, 4x5 in (silver alginate) Discharge Instruction: In clinic and patient to continue to use until interdry arrives. Apply silver alginate to wound bed as instructed Secondary Dressing Woven Gauze Sponge, Non-Sterile 4x4 in Discharge Instruction: Apply over primary dressing as directed. ABD Pad, 5x9 Discharge Instruction: Apply over primary dressing as directed. Secured With 65M Medipore H Soft Cloth Surgical T 4 x 2 (in/yd) ape Discharge Instruction: Secure dressing with tape as directed. Compression Wrap Compression Stockings Add-Ons Electronic Signature(s) Signed: 10/26/2021 5:04:55 PM By: Deon Pilling RN, BSN Entered By: Deon Pilling on 10/26/2021 10:58:21 -------------------------------------------------------------------------------- Wound Assessment Details Patient Name: Date of Service: Yesenia [name], [name] 10/26/2021 10:45 A M Medical Record Number: [id number] Patient Account Number: [id number] Date of Birth/Sex: Treating RN: [birth year] (70 y.o. [name], Meta.Reding Primary Care Winry Egnew: [name] Other Clinician: Referring Carah Barrientes: Treating Kalya Troeger/Extender: Alysia Penna in Treatment: 25 Wound Status Wound Number: 11 Primary Pressure Ulcer Etiology: Wound Location: Left, Medial Upper Leg Wound Open Wounding Event: Gradually Appeared Status: Date Acquired: 09/14/2021 Comorbid  Cataracts, Glaucoma, Chronic sinus problems/congestion, Asthma, Weeks Of Treatment: 6 History: Hypertension, Type II Diabetes, Osteoarthritis, Neuropathy Clustered Wound: No Photos Photo Uploaded By: Valeria Batman on 10/27/2021 08:55:21 Wound Measurements Length: (cm) 0.5 Width: (cm) 1 Depth: (cm) 0.2 Area: (cm) 0.393 Volume: (cm) 0.079 % Reduction in Area: -233.1% % Reduction in Volume: -229.2% Epithelialization: None Tunneling: No Undermining: No Wound Description Classification: Category/Stage III Wound Margin: Distinct, outline attached Exudate Amount: Medium Exudate Type: Serosanguineous Exudate Color: red, Payne Foul Odor After Cleansing: No Slough/Fibrino Yes Wound Bed Granulation Amount: Medium (34-66%) Exposed Structure Granulation Quality: Pink, Pale Fascia Exposed: No Necrotic Amount: Medium (34-66%) Fat Layer (Subcutaneous Tissue) Exposed: Yes Necrotic Quality: Adherent Slough Tendon Exposed: No Muscle Exposed: No Joint Exposed: No Bone Exposed: No Treatment Notes Wound #11 (Upper Leg) Wound Laterality: Left, Medial Cleanser Soap and Water Discharge Instruction: May shower and wash wound with dial antibacterial soap and water prior to dressing change. Peri-Wound Care Zinc Oxide Ointment 30g tube Discharge Instruction: As needed Apply Zinc Oxide to periwound with each dressing change Topical Primary Dressing KerraCel Ag Gelling Fiber Dressing, 4x5 in (silver  alginate) Discharge Instruction: In clinic and patient to continue to use until interdry arrives. Apply silver alginate to wound bed as instructed Secondary Dressing Woven Gauze Sponge, Non-Sterile 4x4 in Discharge Instruction: Apply over primary dressing as directed. ABD Pad, 5x9 Discharge Instruction: Apply over primary dressing as directed. Secured With 27M Medipore H Soft Cloth Surgical T 4 x 2 (in/yd) ape Discharge Instruction: Secure dressing with tape as directed. Compression  Wrap Compression Stockings Add-Ons Electronic Signature(s) Signed: 10/26/2021 5:04:55 PM By: Deon Pilling RN, BSN Entered By: Deon Pilling on 10/26/2021 11:01:47 -------------------------------------------------------------------------------- Wound Assessment Details Patient Name: Date of Service: Yesenia [name], [name] 10/26/2021 10:45 A M Medical Record Number: [id number] Patient Account Number: [id number] Date of Birth/Sex: Treating RN: 03-Mar-1951 (70 y.o. [name], Meta.Reding Primary Care Vyncent Overby: [name] Other Clinician: Referring Carver Murakami: Treating Dhruva Orndoff/Extender: Alysia Penna in Treatment: 25 Wound Status Wound Number: 12 Primary Pressure Ulcer Etiology: Wound Location: Left Abdomen - Lower Quadrant Wound Open Wounding Event: Gradually Appeared Status: Date Acquired: 10/26/2021 Comorbid Cataracts, Glaucoma, Chronic sinus problems/congestion, Asthma, Weeks Of Treatment: 0 History: Hypertension, Type II Diabetes, Osteoarthritis, Neuropathy Clustered Wound: No Photos Photo Uploaded By: Valeria Batman on 10/27/2021 08:56:38 Wound Measurements Length: (cm) 0.8 Width: (cm) 1 Depth: (cm) 0.5 Area: (cm) 0.628 Volume: (cm) 0.314 % Reduction in Area: % Reduction in Volume: Epithelialization: None Tunneling: No Wound Description Classification: Category/Stage III Wound Margin: Distinct, outline attached Exudate Amount: Medium Exudate Type: Serosanguineous Exudate Color: red, Payne Foul Odor After Cleansing: No Wound Bed Granulation Amount: Medium (34-66%) Exposed Structure Granulation Quality: Red, Pink Fascia Exposed: No Necrotic Amount: Medium (34-66%) Fat Layer (Subcutaneous Tissue) Exposed: Yes Necrotic Quality: Adherent Slough Tendon Exposed: No Muscle Exposed: No Joint Exposed: No Bone Exposed: No Treatment Notes Wound #12 (Abdomen - Lower Quadrant) Wound Laterality: Left Cleanser Soap and Water Discharge  Instruction: May shower and wash wound with dial antibacterial soap and water prior to dressing change. Peri-Wound Care Zinc Oxide Ointment 30g tube Discharge Instruction: As needed Apply Zinc Oxide to periwound with each dressing change Topical Primary Dressing KerraCel Ag Gelling Fiber Dressing, 4x5 in (silver alginate) Discharge Instruction: In clinic and patient to continue to use until interdry arrives. Apply silver alginate to wound bed as instructed Secondary Dressing Woven Gauze Sponge, Non-Sterile 4x4 in Discharge Instruction: Apply over primary dressing as directed. ABD Pad, 5x9 Discharge Instruction: Apply over primary dressing as directed. Secured With 27M Medipore H Soft Cloth Surgical T 4 x 2 (in/yd) ape Discharge Instruction: Secure dressing with tape as directed. Compression Wrap Compression Stockings Add-Ons Electronic Signature(s) Signed: 10/26/2021 5:04:55 PM By: Deon Pilling RN, BSN Entered By: Deon Pilling on 10/26/2021 11:06:58 -------------------------------------------------------------------------------- Wound Assessment Details Patient Name: Date of Service: Yesenia [name], [name] 10/26/2021 10:45 A M Medical Record Number: [id number] Patient Account Number: [id number] Date of Birth/Sex: Treating RN: 12-25-1950 (70 y.o. [name], Meta.Reding Primary Care Yuriel Lopezmartinez: [name] Other Clinician: Referring Alastor Kneale: Treating Esma Kilts/Extender: Alysia Penna in Treatment: 25 Wound Status Wound Number: 6 Primary Diabetic Wound/Ulcer of the Lower Extremity Etiology: Wound Location: Right Upper Leg Wound Open Wounding Event: Gradually Appeared Status: Date Acquired: 11/06/2020 Comorbid Cataracts, Glaucoma, Chronic sinus problems/congestion, Asthma, Weeks Of Treatment: 25 History: Hypertension, Type II Diabetes, Osteoarthritis, Neuropathy Clustered Wound: No Photos Photo Uploaded By: Valeria Batman on 10/27/2021 08:43:40 Wound  Measurements Length: (cm) 1.5 Width: (cm) 1.5 Depth: (cm) 0.6 Area: (cm) 1.767 Volume: (cm) 1.06 % Reduction in Area:  93.8% % Reduction in Volume: 96.3% Epithelialization: Small (1-33%) Tunneling: No Undermining: No Wound Description Classification: Grade 2 Wound Margin: Distinct, outline attached Exudate Amount: Medium Exudate Type: Serous Exudate Color: amber Foul Odor After Cleansing: No Slough/Fibrino Yes Wound Bed Granulation Amount: Large (67-100%) Exposed Structure Granulation Quality: Pink, Pale, Friable Fascia Exposed: No Necrotic Amount: Small (1-33%) Fat Layer (Subcutaneous Tissue) Exposed: Yes Necrotic Quality: Adherent Slough Tendon Exposed: No Muscle Exposed: No Joint Exposed: No Bone Exposed: No Treatment Notes Wound #6 (Upper Leg) Wound Laterality: Right Cleanser Soap and Water Discharge Instruction: May shower and wash wound with dial antibacterial soap and water prior to dressing change. Peri-Wound Care Zinc Oxide Ointment 30g tube Discharge Instruction: As needed Apply Zinc Oxide to periwound with each dressing change Topical Primary Dressing KerraCel Ag Gelling Fiber Dressing, 4x5 in (silver alginate) Discharge Instruction: In clinic and patient to continue to use until interdry arrives. Apply silver alginate to wound bed as instructed Secondary Dressing Woven Gauze Sponge, Non-Sterile 4x4 in Discharge Instruction: Apply over primary dressing as directed. ABD Pad, 5x9 Discharge Instruction: Apply over primary dressing as directed. Secured With 34M Medipore H Soft Cloth Surgical T 4 x 2 (in/yd) ape Discharge Instruction: Secure dressing with tape as directed. Compression Wrap Compression Stockings Add-Ons Electronic Signature(s) Signed: 10/26/2021 5:04:55 PM By: Deon Pilling RN, BSN Entered By: Deon Pilling on 10/26/2021 11:05:19 -------------------------------------------------------------------------------- Wound Assessment  Details Patient Name: Date of Service: Yesenia [name], [name] 10/26/2021 10:45 A M Medical Record Number: [id number] Patient Account Number: [id number] Date of Birth/Sex: Treating RN: 1951/05/07 (70 y.o. [name], Meta.Reding Primary Care Marillyn Goren: [name] Other Clinician: Referring Ziomara Birenbaum: Treating Sohil Timko/Extender: Alysia Penna in Treatment: 25 Wound Status Wound Number: 8 Primary Diabetic Wound/Ulcer of the Lower Extremity Etiology: Wound Location: Left, Lateral Upper Leg Wound Open Wounding Event: Gradually Appeared Status: Date Acquired: 01/06/2021 Comorbid Cataracts, Glaucoma, Chronic sinus problems/congestion, Asthma, Weeks Of Treatment: 25 History: Hypertension, Type II Diabetes, Osteoarthritis, Neuropathy Clustered Wound: No Photos Photo Uploaded By: Valeria Batman on 10/27/2021 08:56:10 Wound Measurements Length: (cm) 11.2 Width: (cm) 5.5 Depth: (cm) 3.4 Area: (cm) 48.381 Volume: (cm) 164.494 % Reduction in Area: 57.2% % Reduction in Volume: 41.8% Epithelialization: Medium (34-66%) Tunneling: No Undermining: No Wound Description Classification: Grade 2 Wound Margin: Distinct, outline attached Exudate Amount: Large Exudate Type: Serosanguineous Exudate Color: red, Payne Foul Odor After Cleansing: Yes Due to Product Use: Yes Slough/Fibrino No Wound Bed Granulation Amount: Large (67-100%) Exposed Structure Granulation Quality: Red, Friable Fascia Exposed: No Necrotic Amount: None Present (0%) Fat Layer (Subcutaneous Tissue) Exposed: Yes Tendon Exposed: No Muscle Exposed: No Joint Exposed: No Bone Exposed: No Treatment Notes Wound #8 (Upper Leg) Wound Laterality: Left, Lateral Cleanser Soap and Water Discharge Instruction: May shower and wash wound with dial antibacterial soap and water prior to dressing change. Wound Cleanser Discharge Instruction: Cleanse the wound with wound cleanser prior to applying a clean  dressing using gauze sponges, not tissue or cotton balls. Peri-Wound Care Topical Primary Dressing Wet-to-dry Discharge Instruction: wet to dry in clinic. Wound vac three times a week. Secondary Dressing Woven Gauze Sponges 2x2 in Discharge Instruction: Apply over primary dressing as directed. ABD Pad, 5x9 Discharge Instruction: Apply over primary dressing as directed. Secured With 34M Medipore H Soft Cloth Surgical T ape, 4 x 10 (in/yd) Discharge Instruction: Secure with tape as directed. Compression Wrap Compression Stockings Add-Ons Electronic Signature(s) Signed: 10/26/2021 5:04:55 PM By: Deon Pilling RN, BSN Entered By: Deon Pilling  on 10/26/2021 11:03:41 -------------------------------------------------------------------------------- Wound Assessment Details Patient Name: Date of Service: Yesenia Payne, [name] 10/26/2021 10:45 A M Medical Record Number: [id number] Patient Account Number: [id number] Date of Birth/Sex: Treating RN: [birth year] (70 y.o. [name], Meta.Reding Primary Care Ladean Steinmeyer: [name] Other Clinician: Referring Arianah Torgeson: Treating Brandilee Pies/Extender: Alysia Penna in Treatment: 25 Wound Status Wound Number: 9 Primary Lesion Etiology: Wound Location: Right, Proximal Upper Leg Wound Open Wounding Event: Other Lesion Status: Date Acquired: 08/19/2021 Comorbid Cataracts, Glaucoma, Chronic sinus problems/congestion, Asthma, Weeks Of Treatment: 8 History: Hypertension, Type II Diabetes, Osteoarthritis, Neuropathy Clustered Wound: No Photos Photo Uploaded By: Valeria Batman on 10/27/2021 08:53:01 Wound Measurements Length: (cm) 0.1 Width: (cm) 0.1 Depth: (cm) 0.1 Area: (cm) 0.008 Volume: (cm) 0.001 % Reduction in Area: 99.5% % Reduction in Volume: 99.7% Epithelialization: Large (67-100%) Tunneling: No Undermining: No Wound Description Classification: Full Thickness Without Exposed Support Structures Wound Margin:  Distinct, outline attached Exudate Amount: None Present Foul Odor After Cleansing: No Slough/Fibrino No Wound Bed Granulation Amount: None Present (0%) Exposed Structure Necrotic Amount: None Present (0%) Fascia Exposed: No Fat Layer (Subcutaneous Tissue) Exposed: Yes Tendon Exposed: No Muscle Exposed: No Joint Exposed: No Bone Exposed: No Treatment Notes Wound #9 (Upper Leg) Wound Laterality: Right, Proximal Cleanser Soap and Water Discharge Instruction: May shower and wash wound with dial antibacterial soap and water prior to dressing change. Peri-Wound Care Zinc Oxide Ointment 30g tube Discharge Instruction: As needed Apply Zinc Oxide to periwound with each dressing change Topical Primary Dressing KerraCel Ag Gelling Fiber Dressing, 4x5 in (silver alginate) Discharge Instruction: In clinic and patient to continue to use until interdry arrives. Apply silver alginate to wound bed as instructed Secondary Dressing Woven Gauze Sponge, Non-Sterile 4x4 in Discharge Instruction: Apply over primary dressing as directed. ABD Pad, 5x9 Discharge Instruction: Apply over primary dressing as directed. Secured With 55M Medipore H Soft Cloth Surgical T 4 x 2 (in/yd) ape Discharge Instruction: Secure dressing with tape as directed. Compression Wrap Compression Stockings Add-Ons Electronic Signature(s) Signed: 10/26/2021 5:04:55 PM By: Deon Pilling RN, BSN Entered By: Deon Pilling on 10/26/2021 10:59:27 -------------------------------------------------------------------------------- Vitals Details Patient Name: Date of Service: Yesenia [name], [name] 10/26/2021 10:45 A M Medical Record Number: [id number] Patient Account Number: [id number] Date of Birth/Sex: Treating RN: 05-05-1951 (70 y.o. [name] Primary Care Vincenzo Stave: [name] Other Clinician: Referring Fronia Depass: Treating Anshul Meddings/Extender: Alysia Penna in Treatment: 25 Vital  Signs Time Taken: 10:33 Temperature (F): 97.5 Height (in): 63 Pulse (bpm): 79 Weight (lbs): 279 Respiratory Rate (breaths/min): 18 Body Mass Index (BMI): 49.4 Blood Pressure (mmHg): 114/75 Capillary Blood Glucose (mg/dl): 211 Reference Range: 80 - 120 mg / dl Electronic Signature(s) Signed: 10/27/2021 9:12:23 AM By: Sandre Kitty Entered By: Sandre Kitty on 10/26/2021 10:34:37

## 2021-11-04 DIAGNOSIS — S71101A Unspecified open wound, right thigh, initial encounter: Secondary | ICD-10-CM | POA: Diagnosis not present

## 2021-11-06 DIAGNOSIS — L8990 Pressure ulcer of unspecified site, unspecified stage: Secondary | ICD-10-CM | POA: Diagnosis not present

## 2021-11-06 DIAGNOSIS — L02612 Cutaneous abscess of left foot: Secondary | ICD-10-CM | POA: Diagnosis not present

## 2021-11-08 DIAGNOSIS — L97829 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ity: Secondary | ICD-10-CM | POA: Diagnosis not present

## 2021-11-08 DIAGNOSIS — E1169 Type 2 diabetes mellitus with other specified complication: Secondary | ICD-10-CM | POA: Diagnosis not present

## 2021-11-17 ENCOUNTER — Encounter (HOSPITAL_BASED_OUTPATIENT_CLINIC_OR_DEPARTMENT_OTHER): Payer: Medicare Other | Admitting: Internal Medicine

## 2021-11-23 ENCOUNTER — Other Ambulatory Visit: Payer: Self-pay

## 2021-11-23 ENCOUNTER — Encounter (HOSPITAL_BASED_OUTPATIENT_CLINIC_OR_DEPARTMENT_OTHER): Payer: Medicare Other | Attending: Internal Medicine | Admitting: Internal Medicine

## 2021-11-23 DIAGNOSIS — E11622 Type 2 diabetes mellitus with other skin ulcer: Secondary | ICD-10-CM | POA: Diagnosis not present

## 2021-11-23 DIAGNOSIS — Z89512 Acquired absence of left leg below knee: Secondary | ICD-10-CM | POA: Diagnosis not present

## 2021-11-23 DIAGNOSIS — L97822 Non-pressure chronic ulcer of other part of left lower leg with fat layer exposed: Secondary | ICD-10-CM | POA: Diagnosis not present

## 2021-11-23 DIAGNOSIS — S31109D Unspecified open wound of abdominal wall, unspecified quadrant without penetration into peritoneal cavity, subsequent encounter: Secondary | ICD-10-CM

## 2021-11-23 DIAGNOSIS — Z743 Need for continuous supervision: Secondary | ICD-10-CM | POA: Diagnosis not present

## 2021-11-23 DIAGNOSIS — L97812 Non-pressure chronic ulcer of other part of right lower leg with fat layer exposed: Secondary | ICD-10-CM | POA: Diagnosis not present

## 2021-11-23 DIAGNOSIS — E1142 Type 2 diabetes mellitus with diabetic polyneuropathy: Secondary | ICD-10-CM | POA: Insufficient documentation

## 2021-11-23 DIAGNOSIS — R5381 Other malaise: Secondary | ICD-10-CM | POA: Diagnosis not present

## 2021-11-23 DIAGNOSIS — X58XXXA Exposure to other specified factors, initial encounter: Secondary | ICD-10-CM | POA: Insufficient documentation

## 2021-11-23 DIAGNOSIS — E119 Type 2 diabetes mellitus without complications: Secondary | ICD-10-CM | POA: Diagnosis not present

## 2021-11-23 DIAGNOSIS — L97829 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ity: Secondary | ICD-10-CM | POA: Diagnosis not present

## 2021-11-23 DIAGNOSIS — R279 Unspecified lack of coordination: Secondary | ICD-10-CM | POA: Diagnosis not present

## 2021-11-23 DIAGNOSIS — L97819 Non-pressure chronic ulcer of other part of right lower leg with unspecified severity: Secondary | ICD-10-CM

## 2021-11-23 DIAGNOSIS — S31109A Unspecified open wound of abdominal wall, unspecified quadrant without penetration into peritoneal cavity, initial encounter: Secondary | ICD-10-CM | POA: Diagnosis not present

## 2021-11-24 DIAGNOSIS — S71101A Unspecified open wound, right thigh, initial encounter: Secondary | ICD-10-CM | POA: Diagnosis not present

## 2021-11-24 NOTE — Progress Notes (Signed)
ESLY, SELVAGE ([id number]) Visit Report for 11/23/2021 Arrival Information Details Patient Name: Date of Service: [name], [name] 11/23/2021 10:45 A M Medical Record Number: [id number] Patient Account Number: [id number] Date of Birth/Sex: Treating RN: 1951-06-01 (71 y.o. [name] Primary Care Phenix Vandermeulen: [name] Other Clinician: Referring Gawain Crombie: Treating Dorian Renfro/Extender: Alysia Penna in Treatment: 29 Visit Information History Since Last Visit Added or deleted any medications: No Patient Arrived: Stretcher Any new allergies or adverse reactions: No Arrival Time: 11:02 Had a fall or experienced change in No Accompanied By: daughter activities of daily living that may affect Transfer Assistance: Stretcher risk of falls: Patient Identification Verified: Yes Signs or symptoms of abuse/neglect since last visito No Patient Requires Transmission-Based Precautions: No Hospitalized since last visit: No Patient Has Alerts: No Implantable device outside of the clinic excluding No cellular tissue based products placed in the center since last visit: Has Dressing in Place as Prescribed: Yes Pain Present Now: Yes Electronic Signature(s) Signed: 11/24/2021 3:29:02 PM By: Dellie Catholic RN Entered By: Dellie Catholic on 11/23/2021 11:03:13 -------------------------------------------------------------------------------- Clinic Level of Care Assessment Details Patient Name: Date of Service: [name], [name] 11/23/2021 10:45 A M Medical Record Number: [id number] Patient Account Number: [id number] Date of Birth/Sex: Treating RN: 01-10-1951 (71 y.o. [name] Primary Care Gabrella Stroh: [name] Other Clinician: Referring Baley Lorimer: Treating Jude Naclerio/Extender: Alysia Penna in Treatment: 29 Clinic Level of Care Assessment Items TOOL 4 Quantity Score X- 1 0 Use when only an EandM is performed on  FOLLOW-UP visit ASSESSMENTS - Nursing Assessment / Reassessment _0  - 0 Reassessment of Co-morbidities (includes updates in patient status) _1  - 0 Reassessment of Adherence to Treatment Plan ASSESSMENTS - Wound and Skin A ssessment / Reassessment _2  - 0 Simple Wound Assessment / Reassessment - one wound X- 6 5 Complex Wound Assessment / Reassessment - multiple wounds _3  - 0 Dermatologic / Skin Assessment (not related to wound area) ASSESSMENTS - Focused Assessment _4  - 0 Circumferential Edema Measurements - multi extremities _5  - 0 Nutritional Assessment / Counseling / Intervention _6  - 0 Lower Extremity Assessment (monofilament, tuning fork, pulses) _7  - 0 Peripheral Arterial Disease Assessment (using hand held doppler) ASSESSMENTS - Ostomy and/or Continence Assessment and Care _8  - 0 Incontinence Assessment and Management _9  - 0 Ostomy Care Assessment and Management (repouching, etc.) PROCESS - Coordination of Care _10  - 0 Simple Patient / Family Education for ongoing care X- 1 20 Complex (extensive) Patient / Family Education for ongoing care X- 1 10 Staff obtains Programmer, systems, Records, T Results / Process Orders est _11  - 0 Staff telephones HHA, Nursing Homes / Clarify orders / etc _12  - 0 Routine Transfer to another Facility (non-emergent condition) _13  - 0 Routine Hospital Admission (non-emergent condition) _14  - 0 New Admissions / Biomedical engineer / Ordering NPWT Apligraf, etc. , _15  - 0 Emergency Hospital Admission (emergent condition) _16  - 0 Simple Discharge Coordination _17  - 0 Complex (extensive) Discharge Coordination PROCESS - Special Needs _18  - 0 Pediatric / Minor Patient Management _19  - 0 Isolation Patient Management _20  - 0 Hearing / Language / Visual special needs _21  - 0 Assessment of Community assistance (transportation, D/C planning, etc.) _22  - 0 Additional assistance / Altered mentation _23  - 0 Support Surface(s) Assessment (bed, cushion,  seat, etc.) INTERVENTIONS - Wound Cleansing / Measurement _24  - 0 Simple Wound Cleansing - one wound X- 6 5 Complex Wound Cleansing - multiple wounds X-  1 5 Wound Imaging (photographs - any number of wounds) _0  - 0 Wound Tracing (instead of photographs) _1  - 0 Simple Wound Measurement - one wound X- 6 5 Complex Wound Measurement - multiple wounds INTERVENTIONS - Wound Dressings X - Small Wound Dressing one or multiple wounds 5 10 _2  - 0 Medium Wound Dressing one or multiple wounds X- 1 20 Large Wound Dressing one or multiple wounds <[id number]>_9<\/QQIWLNLGXQJJHERD>_4  - 0 Application of Medications - topical <[id number]>_2<\/OVZCHYIFOYDXAJOI>_7  - 0 Application of Medications - injection INTERVENTIONS - Miscellaneous _5  - 0 External ear exam _6  - 0 Specimen Collection (cultures, biopsies, blood, body fluids, etc.) _7  - 0 Specimen(s) / Culture(s) sent or taken to Lab for analysis _8  - 0 Patient Transfer (multiple staff / Civil Service fast streamer / Similar devices) _9  - 0 Simple Staple / Suture removal (25 or less) _10  - 0 Complex Staple / Suture removal (26 or more) _11  - 0 Hypo / Hyperglycemic Management (close monitor of Blood Glucose) _12  - 0 Ankle / Brachial Index (ABI) - do not check if billed separately X- 1 5 Vital Signs Has the patient been seen at the hospital within the last three years: Yes Total Score: 200 Level Of Care: New/Established - Level 5 Electronic Signature(s) Signed: 11/24/2021 3:58:04 PM By: Lorrin Jackson Entered By: Lorrin Jackson on 11/23/2021 11:56:12 -------------------------------------------------------------------------------- Encounter Discharge Information Details Patient Name: Date of Service: [name], [name] 11/23/2021 10:45 A M Medical Record Number: [id number] Patient Account Number: [id number] Date of Birth/Sex: Treating RN: 11/24/1950 (71 y.o. [name] Primary Care Adler Chartrand: [name] Other Clinician: Referring Amayiah Gosnell: Treating Narcissus Detwiler/Extender: Alysia Penna  in Treatment: 29 Encounter Discharge Information Items Discharge Condition: Stable Ambulatory Status: Stretcher Discharge Destination: Home Transportation: Ambulance Accompanied By: Daughter Schedule Follow-up Appointment: Yes Clinical Summary of Care: Provided on 11/23/2021 Form Type Recipient Paper Patient Patient Electronic Signature(s) Signed: 11/23/2021 12:04:53 PM By: Lorrin Jackson Entered By: Lorrin Jackson on 11/23/2021 12:04:53 -------------------------------------------------------------------------------- Lower Extremity Assessment Details Patient Name: Date of Service: [name], [name] 11/23/2021 10:45 A M Medical Record Number: [id number] Patient Account Number: [id number] Date of Birth/Sex: Treating RN: Aug 21, 1951 (71 y.o. [name] Primary Care Rylynne Schicker: [name] Other Clinician: Referring Avonne Berkery: Treating Trae Bovenzi/Extender: Alysia Penna in Treatment: 29 Electronic Signature(s) Signed: 11/24/2021 3:29:02 PM By: Dellie Catholic RN Previous Signature: 11/23/2021 11:07:29 AM Version By: Lorrin Jackson Entered By: Dellie Catholic on 11/23/2021 11:12:18 -------------------------------------------------------------------------------- Multi Wound Chart Details Patient Name: Date of Service: [name], [name] 11/23/2021 10:45 A M Medical Record Number: [id number] Patient Account Number: [id number] Date of Birth/Sex: Treating RN: 1951/03/06 (71 y.o. [name] Primary Care Tenille Morrill: [name] Other Clinician: Referring Mishael Krysiak: Treating Melek Pownall/Extender: Alysia Penna in Treatment: 29 Vital Signs Height(in): 67 Pulse(bpm): 78 Weight(lbs): 92 Blood Pressure(mmHg): 135/82 Body Mass Index(BMI): 49 Temperature(F): 98.1 Respiratory Rate(breaths/min): 18 Photos: Right Abdomen - Lower Quadrant Left, Medial Upper Leg Left Abdomen - Lower Quadrant Wound Location: Gradually  Appeared Gradually Appeared Gradually Appeared Wounding Event: Pressure Ulcer Pressure Ulcer Pressure Ulcer Primary Etiology: Cataracts, Glaucoma, Chronic sinus Cataracts, Glaucoma, Chronic sinus Cataracts, Glaucoma, Chronic sinus Comorbid History: problems/congestion, Asthma, problems/congestion, Asthma, problems/congestion, Asthma, Hypertension, Type II Diabetes, Hypertension, Type II Diabetes, Hypertension, Type II Diabetes, Osteoarthritis, Neuropathy Osteoarthritis, Neuropathy Osteoarthritis, Neuropathy 09/14/2021 09/14/2021 10/26/2021 Date Acquired: _13 Weeks of Treatment: Open Open Open Wound Status: 0.5x1.6x0.4 0.9x1.6x0.2 1x1.4x0.3 Measurements L x W x D (cm)  0.628 1.131 1.1 A (cm) : rea 0.251 0.226 0.33 Volume (cm) : -42.70% -858.50% -75.20% % Reduction in A rea: -185.20% -841.70% -5.10% % Reduction in Volume: Category/Stage III Category/Stage III Category/Stage III Classification: Medium Medium Medium Exudate A mount: Serosanguineous Serosanguineous Serosanguineous Exudate Type: red, brown red, brown red, brown Exudate Color: No No No Foul Odor A Cleansing: fter N/A N/A N/A Odor A nticipated Due to Product Use: Distinct, outline attached Distinct, outline attached Distinct, outline attached Wound Margin: Large (67-100%) Medium (34-66%) Medium (34-66%) Granulation A mount: Red, Pink Pink, Pale Red, Pink Granulation Quality: Small (1-33%) Medium (34-66%) Medium (34-66%) Necrotic A mount: Fat Layer (Subcutaneous Tissue): Yes Fat Layer (Subcutaneous Tissue): Yes Fat Layer (Subcutaneous Tissue): Yes Exposed Structures: Fascia: No Fascia: No Fascia: No Tendon: No Tendon: No Tendon: No Muscle: No Muscle: No Muscle: No Joint: No Joint: No Joint: No Bone: No Bone: No Bone: No Small (1-33%) None None Epithelialization: Wound Number: _0 Photos: Right Groin Right Upper Leg Left, Lateral Upper Leg Wound Location: Gradually Appeared Gradually  Appeared Gradually Appeared Wounding Event: Pressure Ulcer Diabetic Wound/Ulcer of the Lower Diabetic Wound/Ulcer of the Lower Primary Etiology: Extremity Extremity Cataracts, Glaucoma, Chronic sinus Cataracts, Glaucoma, Chronic sinus Cataracts, Glaucoma, Chronic sinus Comorbid History: problems/congestion, Asthma, problems/congestion, Asthma, problems/congestion, Asthma, Hypertension, Type II Diabetes, Hypertension, Type II Diabetes, Hypertension, Type II Diabetes, Osteoarthritis, Neuropathy Osteoarthritis, Neuropathy Osteoarthritis, Neuropathy 11/23/2021 11/06/2020 01/06/2021 Date Acquired: 0 29 29 Weeks of Treatment: Open Open Open Wound Status: 3.5x1.6x0.2 1.5x1.5x0.5 11.5x5.5x2.5 Measurements L x W x D (cm) 4.398 1.767 49.676 A (cm) : rea 0.88 0.884 124.191 Volume (cm) : 0.00% 93.80% 56.10% % Reduction in A rea: 0.00% 96.90% 56.10% % Reduction in Volume: Category/Stage III Grade 2 Grade 2 Classification: Medium Medium Large Exudate A mount: Serosanguineous Serosanguineous Serosanguineous Exudate Type: red, brown red, brown red, brown Exudate Color: No No Yes Foul Odor A Cleansing: fter N/A N/A Yes Odor A nticipated Due to Product Use: Distinct, outline attached Distinct, outline attached Distinct, outline attached Wound Margin: Large (67-100%) Large (67-100%) Large (67-100%) Granulation A mount: Red Pink, Pale, Friable Red, Friable Granulation Quality: Small (1-33%) Small (1-33%) None Present (0%) Necrotic A mount: Fat Layer (Subcutaneous Tissue): Yes Fat Layer (Subcutaneous Tissue): Yes Fat Layer (Subcutaneous Tissue): Yes Exposed Structures: Fascia: No Fascia: No Fascia: No Tendon: No Tendon: No Tendon: No Muscle: No Muscle: No Muscle: No Joint: No Joint: No Joint: No Bone: No Bone: No Bone: No None Small (1-33%) Medium (34-66%) Epithelialization: Wound Number: 9 N/A N/A Photos: N/A N/A Right, Proximal Upper Leg N/A N/A Wound  Location: Other Lesion N/A N/A Wounding Event: Lesion N/A N/A Primary Etiology: Cataracts, Glaucoma, Chronic sinus N/A N/A Comorbid History: problems/congestion, Asthma, Hypertension, Type II Diabetes, Osteoarthritis, Neuropathy 08/19/2021 N/A N/A Date Acquired: 12 N/A N/A Weeks of Treatment: Healed - Epithelialized N/A N/A Wound Status: 0x0x0 N/A N/A Measurements L x W x D (cm) 0 N/A N/A A (cm) : rea 0 N/A N/A Volume (cm) : 100.00% N/A N/A % Reduction in Area: 100.00% N/A N/A % Reduction in Volume: Full Thickness Without Exposed N/A N/A Classification: Support Structures N/A N/A N/A Exudate A mount: N/A N/A N/A Exudate Type: N/A N/A N/A Exudate Color: N/A N/A N/A Foul Odor A Cleansing: fter N/A N/A N/A Odor A nticipated Due to Product Use: N/A N/A N/A Wound Margin: N/A N/A N/A Granulation A mount: N/A N/A N/A Granulation Quality: N/A N/A N/A Necrotic A mount: N/A N/A N/A Exposed Structures: N/A  N/A N/A Epithelialization: Treatment Notes Wound #10 (Abdomen - Lower Quadrant) Wound Laterality: Right Cleanser Soap and Water Discharge Instruction: May shower and wash wound with dial antibacterial soap and water prior to dressing change. Peri-Wound Care Zinc Oxide Ointment 30g tube Discharge Instruction: As needed Apply Zinc Oxide to periwound with each dressing change Topical Primary Dressing KerraCel Ag Gelling Fiber Dressing, 4x5 in (silver alginate) Discharge Instruction: In clinic and patient to continue to use until interdry arrives. Apply silver alginate to wound bed as instructed Secondary Dressing Woven Gauze Sponge, Non-Sterile 4x4 in Discharge Instruction: Apply over primary dressing as directed. ABD Pad, 5x9 Discharge Instruction: Apply over primary dressing as directed. Secured With 17M Medipore H Soft Cloth Surgical T 4 x 2 (in/yd) ape Discharge Instruction: Secure dressing with tape as directed. Compression Wrap Compression  Stockings Add-Ons Wound #11 (Upper Leg) Wound Laterality: Left, Medial Cleanser Soap and Water Discharge Instruction: May shower and wash wound with dial antibacterial soap and water prior to dressing change. Peri-Wound Care Zinc Oxide Ointment 30g tube Discharge Instruction: As needed Apply Zinc Oxide to periwound with each dressing change Topical Primary Dressing KerraCel Ag Gelling Fiber Dressing, 4x5 in (silver alginate) Discharge Instruction: In clinic and patient to continue to use until interdry arrives. Apply silver alginate to wound bed as instructed Secondary Dressing Woven Gauze Sponge, Non-Sterile 4x4 in Discharge Instruction: Apply over primary dressing as directed. ABD Pad, 5x9 Discharge Instruction: Apply over primary dressing as directed. Secured With 17M Medipore H Soft Cloth Surgical T 4 x 2 (in/yd) ape Discharge Instruction: Secure dressing with tape as directed. Compression Wrap Compression Stockings Add-Ons Wound #12 (Abdomen - Lower Quadrant) Wound Laterality: Left Cleanser Soap and Water Discharge Instruction: May shower and wash wound with dial antibacterial soap and water prior to dressing change. Peri-Wound Care Zinc Oxide Ointment 30g tube Discharge Instruction: As needed Apply Zinc Oxide to periwound with each dressing change Topical Primary Dressing KerraCel Ag Gelling Fiber Dressing, 4x5 in (silver alginate) Discharge Instruction: In clinic and patient to continue to use until interdry arrives. Apply silver alginate to wound bed as instructed Secondary Dressing Woven Gauze Sponge, Non-Sterile 4x4 in Discharge Instruction: Apply over primary dressing as directed. ABD Pad, 5x9 Discharge Instruction: Apply over primary dressing as directed. Secured With 17M Medipore H Soft Cloth Surgical T 4 x 2 (in/yd) ape Discharge Instruction: Secure dressing with tape as directed. Compression Wrap Compression Stockings Add-Ons Wound #13 (Groin) Wound  Laterality: Right Cleanser Soap and Water Discharge Instruction: May shower and wash wound with dial antibacterial soap and water prior to dressing change. Peri-Wound Care Zinc Oxide Ointment 30g tube Discharge Instruction: As needed Apply Zinc Oxide to periwound with each dressing change Topical Primary Dressing KerraCel Ag Gelling Fiber Dressing, 4x5 in (silver alginate) Discharge Instruction: In clinic and patient to continue to use until interdry arrives. Apply silver alginate to wound bed as instructed Secondary Dressing Woven Gauze Sponge, Non-Sterile 4x4 in Discharge Instruction: Apply over primary dressing as directed. ABD Pad, 5x9 Discharge Instruction: Apply over primary dressing as directed. Secured With 17M Medipore H Soft Cloth Surgical T 4 x 2 (in/yd) ape Discharge Instruction: Secure dressing with tape as directed. Compression Wrap Compression Stockings Add-Ons Wound #6 (Upper Leg) Wound Laterality: Right Cleanser Soap and Water Discharge Instruction: May shower and wash wound with dial antibacterial soap and water prior to dressing change. Peri-Wound Care Zinc Oxide Ointment 30g tube Discharge Instruction: As needed Apply Zinc Oxide to periwound with each dressing change Topical  Primary Dressing KerraCel Ag Gelling Fiber Dressing, 4x5 in (silver alginate) Discharge Instruction: In clinic and patient to continue to use until interdry arrives. Apply silver alginate to wound bed as instructed Secondary Dressing Woven Gauze Sponge, Non-Sterile 4x4 in Discharge Instruction: Apply over primary dressing as directed. ABD Pad, 5x9 Discharge Instruction: Apply over primary dressing as directed. Secured With 68M Medipore H Soft Cloth Surgical T 4 x 2 (in/yd) ape Discharge Instruction: Secure dressing with tape as directed. Compression Wrap Compression Stockings Add-Ons Wound #8 (Upper Leg) Wound Laterality: Left, Lateral Cleanser Soap and Water Discharge  Instruction: May shower and wash wound with dial antibacterial soap and water prior to dressing change. Wound Cleanser Discharge Instruction: Cleanse the wound with wound cleanser prior to applying a clean dressing using gauze sponges, not tissue or cotton balls. Peri-Wound Care Topical Primary Dressing Wet-to-dry Discharge Instruction: wet to dry in clinic. Wound vac three times a week. Secondary Dressing Woven Gauze Sponges 2x2 in Discharge Instruction: Apply over primary dressing as directed. ABD Pad, 5x9 Discharge Instruction: Apply over primary dressing as directed. Secured With 68M Medipore H Soft Cloth Surgical T ape, 4 x 10 (in/yd) Discharge Instruction: Secure with tape as directed. Compression Wrap Compression Stockings Add-Ons Electronic Signature(s) Signed: 11/23/2021 12:26:18 PM By: Kalman Shan DO Signed: 11/24/2021 3:58:04 PM By: Lorrin Jackson Entered By: Kalman Shan on 11/23/2021 12:14:53 -------------------------------------------------------------------------------- Multi-Disciplinary Care Plan Details Patient Name: Date of Service: [name], [name] 11/23/2021 10:45 A M Medical Record Number: [id number] Patient Account Number: [id number] Date of Birth/Sex: Treating RN: 1951/07/30 (71 y.o. [name] Primary Care Destan Franchini: [name] Other Clinician: Referring Kirstein Baxley: Treating Hiba Garry/Extender: Alysia Penna in Treatment: 29 Active Inactive Wound/Skin Impairment Nursing Diagnoses: Impaired tissue integrity Knowledge deficit related to ulceration/compromised skin integrity Goals: Patient will demonstrate a reduced rate of smoking or cessation of smoking Date Initiated: 04/30/2021 Target Resolution Date: 12/21/2021 Goal Status: Active Patient/caregiver will verbalize understanding of skin care regimen Date Initiated: 04/30/2021 Target Resolution Date: 12/21/2021 Goal Status: Active Ulcer/skin breakdown  will have a volume reduction of 50% by week 8 Date Initiated: 07/09/2021 Date Inactivated: 08/31/2021 Target Resolution Date: 08/31/2021 Goal Status: Met Interventions: Assess patient/caregiver ability to obtain necessary supplies Assess patient/caregiver ability to perform ulcer/skin care regimen upon admission and as needed Assess ulceration(s) every visit Notes: Electronic Signature(s) Signed: 11/23/2021 11:08:39 AM By: Lorrin Jackson Entered By: Lorrin Jackson on 11/23/2021 11:08:38 -------------------------------------------------------------------------------- Pain Assessment Details Patient Name: Date of Service: [name], [name] 11/23/2021 10:45 A M Medical Record Number: [id number] Patient Account Number: [id number] Date of Birth/Sex: Treating RN: [birth year] (71 y.o. [name] Primary Care Finnbar Cedillos: [name] Other Clinician: Referring Shyan Scalisi: Treating Oluwanifemi Susman/Extender: Alysia Penna in Treatment: 29 Active Problems Location of Pain Severity and Description of Pain Patient Has Paino No Site Locations Pain Management and Medication Current Pain Management: Electronic Signature(s) Signed: 11/24/2021 3:29:02 PM By: Dellie Catholic RN Entered By: Dellie Catholic on 11/23/2021 11:04:00 -------------------------------------------------------------------------------- Patient/Caregiver Education Details Patient Name: Date of Service: [name], [name] 1/16/2023andnbsp10:45 A M Medical Record Number: [id number] Patient Account Number: [id number] Date of Birth/Gender: Treating RN: 1951-10-24 (71 y.o. [name] Primary Care Physician: [name] Other Clinician: Referring Physician: Treating Physician/Extender: Alysia Penna in Treatment: 29 Education Assessment Education Provided To: Patient and Caregiver Education Topics Provided Pressure: Methods: Explain/Verbal,  Printed Responses: State content correctly Wound/Skin Impairment: Methods: Explain/Verbal, Printed Responses:  State content correctly Electronic Signature(s) Signed: 11/24/2021 3:58:04 PM By: Lorrin Jackson Entered By: Lorrin Jackson on 11/23/2021 11:09:16 -------------------------------------------------------------------------------- Wound Assessment Details Patient Name: Date of Service: [name], [name] 11/23/2021 10:45 A M Medical Record Number: [id number] Patient Account Number: [id number] Date of Birth/Sex: Treating RN: Oct 05, 1951 (71 y.o. [name] Primary Care Laela Deviney: [name] Other Clinician: Referring Shirl Weir: Treating Junnie Loschiavo/Extender: Alysia Penna in Treatment: 29 Wound Status Wound Number: 10 Primary Pressure Ulcer Etiology: Wound Location: Right Abdomen - Lower Quadrant Wound Open Wounding Event: Gradually Appeared Status: Date Acquired: 09/14/2021 Comorbid Cataracts, Glaucoma, Chronic sinus problems/congestion, Asthma, Weeks Of Treatment: 10 History: Hypertension, Type II Diabetes, Osteoarthritis, Neuropathy Clustered Wound: No Photos Wound Measurements Length: (cm) 0.5 Width: (cm) 1.6 Depth: (cm) 0.4 Area: (cm) 0.628 Volume: (cm) 0.251 % Reduction in Area: -42.7% % Reduction in Volume: -185.2% Epithelialization: Small (1-33%) Tunneling: No Undermining: No Wound Description Classification: Category/Stage III Wound Margin: Distinct, outline attached Exudate Amount: Medium Exudate Type: Serosanguineous Exudate Color: red, brown Wound Bed Granulation Amount: Large (67-100%) Granulation Quality: Red, Pink Necrotic Amount: Small (1-33%) Necrotic Quality: Adherent Slough Foul Odor After Cleansing: No Slough/Fibrino Yes Exposed Structure Fascia Exposed: No Fat Layer (Subcutaneous Tissue) Exposed: Yes Tendon Exposed: No Muscle Exposed: No Joint Exposed: No Bone Exposed: No Treatment Notes Wound  #10 (Abdomen - Lower Quadrant) Wound Laterality: Right Cleanser Soap and Water Discharge Instruction: May shower and wash wound with dial antibacterial soap and water prior to dressing change. Peri-Wound Care Zinc Oxide Ointment 30g tube Discharge Instruction: As needed Apply Zinc Oxide to periwound with each dressing change Topical Primary Dressing KerraCel Ag Gelling Fiber Dressing, 4x5 in (silver alginate) Discharge Instruction: In clinic and patient to continue to use until interdry arrives. Apply silver alginate to wound bed as instructed Secondary Dressing Woven Gauze Sponge, Non-Sterile 4x4 in Discharge Instruction: Apply over primary dressing as directed. ABD Pad, 5x9 Discharge Instruction: Apply over primary dressing as directed. Secured With 80M Medipore H Soft Cloth Surgical T 4 x 2 (in/yd) ape Discharge Instruction: Secure dressing with tape as directed. Compression Wrap Compression Stockings Add-Ons Electronic Signature(s) Signed: 11/24/2021 8:10:57 AM By: Sandre Kitty Signed: 11/24/2021 3:29:02 PM By: Dellie Catholic RN Entered By: Sandre Kitty on 11/23/2021 11:26:03 -------------------------------------------------------------------------------- Wound Assessment Details Patient Name: Date of Service: [name], [name] 11/23/2021 10:45 A M Medical Record Number: [id number] Patient Account Number: [id number] Date of Birth/Sex: Treating RN: [birth year] (71 y.o. [name] Primary Care Mohamadou Maciver: [name] Other Clinician: Referring Austen Wygant: Treating Gorge Almanza/Extender: Alysia Penna in Treatment: 29 Wound Status Wound Number: 11 Primary Pressure Ulcer Etiology: Wound Location: Left, Medial Upper Leg Wound Open Wounding Event: Gradually Appeared Status: Date Acquired: 09/14/2021 Comorbid Cataracts, Glaucoma, Chronic sinus problems/congestion, Asthma, Weeks Of Treatment: 10 History: Hypertension, Type II Diabetes,  Osteoarthritis, Neuropathy Clustered Wound: No Photos Wound Measurements Length: (cm) 0.9 Width: (cm) 1.6 Depth: (cm) 0.2 Area: (cm) 1.131 Volume: (cm) 0.226 % Reduction in Area: -858.5% % Reduction in Volume: -841.7% Epithelialization: None Wound Description Classification: Category/Stage III Wound Margin: Distinct, outline attached Exudate Amount: Medium Exudate Type: Serosanguineous Exudate Color: red, brown Foul Odor After Cleansing: No Slough/Fibrino Yes Wound Bed Granulation Amount: Medium (34-66%) Exposed Structure Granulation Quality: Pink, Pale Fascia Exposed: No Necrotic Amount: Medium (34-66%) Fat Layer (Subcutaneous Tissue) Exposed: Yes Necrotic Quality: Adherent Slough Tendon Exposed: No Muscle Exposed: No Joint Exposed: No Bone Exposed: No Treatment Notes Wound #11 (Upper Leg)  Wound Laterality: Left, Medial Cleanser Soap and Water Discharge Instruction: May shower and wash wound with dial antibacterial soap and water prior to dressing change. Peri-Wound Care Zinc Oxide Ointment 30g tube Discharge Instruction: As needed Apply Zinc Oxide to periwound with each dressing change Topical Primary Dressing KerraCel Ag Gelling Fiber Dressing, 4x5 in (silver alginate) Discharge Instruction: In clinic and patient to continue to use until interdry arrives. Apply silver alginate to wound bed as instructed Secondary Dressing Woven Gauze Sponge, Non-Sterile 4x4 in Discharge Instruction: Apply over primary dressing as directed. ABD Pad, 5x9 Discharge Instruction: Apply over primary dressing as directed. Secured With 24M Medipore H Soft Cloth Surgical T 4 x 2 (in/yd) ape Discharge Instruction: Secure dressing with tape as directed. Compression Wrap Compression Stockings Add-Ons Electronic Signature(s) Signed: 11/24/2021 8:10:57 AM By: Sandre Kitty Signed: 11/24/2021 3:58:04 PM By: Lorrin Jackson Entered By: Sandre Kitty on 11/23/2021  11:13:38 -------------------------------------------------------------------------------- Wound Assessment Details Patient Name: Date of Service: [name], [name] 11/23/2021 10:45 A M Medical Record Number: [id number] Patient Account Number: [id number] Date of Birth/Sex: Treating RN: 1951/10/14 (71 y.o. [name] Primary Care Autrey Human: [name] Other Clinician: Referring Carolyne Whitsel: Treating Daquon Greenleaf/Extender: Alysia Penna in Treatment: 29 Wound Status Wound Number: 12 Primary Pressure Ulcer Etiology: Wound Location: Left Abdomen - Lower Quadrant Wound Open Wounding Event: Gradually Appeared Status: Date Acquired: 10/26/2021 Comorbid Cataracts, Glaucoma, Chronic sinus problems/congestion, Asthma, Weeks Of Treatment: 4 History: Hypertension, Type II Diabetes, Osteoarthritis, Neuropathy Clustered Wound: No Photos Wound Measurements Length: (cm) 1 Width: (cm) 1.4 Depth: (cm) 0.3 Area: (cm) 1.1 Volume: (cm) 0.33 % Reduction in Area: -75.2% % Reduction in Volume: -5.1% Epithelialization: None Wound Description Classification: Category/Stage III Wound Margin: Distinct, outline attached Exudate Amount: Medium Exudate Type: Serosanguineous Exudate Color: red, brown Foul Odor After Cleansing: No Wound Bed Granulation Amount: Medium (34-66%) Exposed Structure Granulation Quality: Red, Pink Fascia Exposed: No Necrotic Amount: Medium (34-66%) Fat Layer (Subcutaneous Tissue) Exposed: Yes Necrotic Quality: Adherent Slough Tendon Exposed: No Muscle Exposed: No Joint Exposed: No Bone Exposed: No Treatment Notes Wound #12 (Abdomen - Lower Quadrant) Wound Laterality: Left Cleanser Soap and Water Discharge Instruction: May shower and wash wound with dial antibacterial soap and water prior to dressing change. Peri-Wound Care Zinc Oxide Ointment 30g tube Discharge Instruction: As needed Apply Zinc Oxide to periwound with each  dressing change Topical Primary Dressing KerraCel Ag Gelling Fiber Dressing, 4x5 in (silver alginate) Discharge Instruction: In clinic and patient to continue to use until interdry arrives. Apply silver alginate to wound bed as instructed Secondary Dressing Woven Gauze Sponge, Non-Sterile 4x4 in Discharge Instruction: Apply over primary dressing as directed. ABD Pad, 5x9 Discharge Instruction: Apply over primary dressing as directed. Secured With 24M Medipore H Soft Cloth Surgical T 4 x 2 (in/yd) ape Discharge Instruction: Secure dressing with tape as directed. Compression Wrap Compression Stockings Add-Ons Electronic Signature(s) Signed: 11/24/2021 8:10:57 AM By: Sandre Kitty Signed: 11/24/2021 3:58:04 PM By: Lorrin Jackson Entered By: Sandre Kitty on 11/23/2021 11:14:39 -------------------------------------------------------------------------------- Wound Assessment Details Patient Name: Date of Service: [name], [name] 11/23/2021 10:45 A M Medical Record Number: [id number] Patient Account Number: [id number] Date of Birth/Sex: Treating RN: 06-20-1951 (71 y.o. [name] Primary Care Cassady Turano: [name] Other Clinician: Referring Sheree Lalla: Treating Erina Hamme/Extender: Alysia Penna in Treatment: 29 Wound Status Wound Number: 13 Primary Pressure Ulcer Etiology: Wound Location: Right Groin Wound Open Wounding Event: Gradually Appeared Status: Date Acquired:  11/23/2021 Comorbid Cataracts, Glaucoma, Chronic sinus problems/congestion, Asthma, Weeks Of Treatment: 0 History: Hypertension, Type II Diabetes, Osteoarthritis, Neuropathy Clustered Wound: No Photos Wound Measurements Length: (cm) 3.5 Width: (cm) 1.6 Depth: (cm) 0.2 Area: (cm) 4.398 Volume: (cm) 0.88 % Reduction in Area: 0% % Reduction in Volume: 0% Epithelialization: None Tunneling: No Undermining: No Wound Description Classification: Category/Stage  III Wound Margin: Distinct, outline attached Exudate Amount: Medium Exudate Type: Serosanguineous Exudate Color: red, brown Foul Odor After Cleansing: No Slough/Fibrino Yes Wound Bed Granulation Amount: Large (67-100%) Exposed Structure Granulation Quality: Red Fascia Exposed: No Necrotic Amount: Small (1-33%) Fat Layer (Subcutaneous Tissue) Exposed: Yes Necrotic Quality: Adherent Slough Tendon Exposed: No Muscle Exposed: No Joint Exposed: No Bone Exposed: No Treatment Notes Wound #13 (Groin) Wound Laterality: Right Cleanser Soap and Water Discharge Instruction: May shower and wash wound with dial antibacterial soap and water prior to dressing change. Peri-Wound Care Zinc Oxide Ointment 30g tube Discharge Instruction: As needed Apply Zinc Oxide to periwound with each dressing change Topical Primary Dressing KerraCel Ag Gelling Fiber Dressing, 4x5 in (silver alginate) Discharge Instruction: In clinic and patient to continue to use until interdry arrives. Apply silver alginate to wound bed as instructed Secondary Dressing Woven Gauze Sponge, Non-Sterile 4x4 in Discharge Instruction: Apply over primary dressing as directed. ABD Pad, 5x9 Discharge Instruction: Apply over primary dressing as directed. Secured With 48M Medipore H Soft Cloth Surgical T 4 x 2 (in/yd) ape Discharge Instruction: Secure dressing with tape as directed. Compression Wrap Compression Stockings Add-Ons Electronic Signature(s) Signed: 11/24/2021 3:29:02 PM By: Dellie Catholic RN Signed: 11/24/2021 3:58:04 PM By: Lorrin Jackson Entered By: Lorrin Jackson on 11/23/2021 11:48:42 -------------------------------------------------------------------------------- Wound Assessment Details Patient Name: Date of Service: [name], [name] 11/23/2021 10:45 A M Medical Record Number: [id number] Patient Account Number: [id number] Date of Birth/Sex: Treating RN: 05-18-1951 (71 y.o. [name] Primary  Care Alanta Scobey: [name] Other Clinician: Referring Trell Secrist: Treating Enos Muhl/Extender: Alysia Penna in Treatment: 29 Wound Status Wound Number: 6 Primary Diabetic Wound/Ulcer of the Lower Extremity Etiology: Wound Location: Right Upper Leg Wound Open Wounding Event: Gradually Appeared Status: Status: Date Acquired: 11/06/2020 Comorbid Cataracts, Glaucoma, Chronic sinus problems/congestion, Asthma, Weeks Of Treatment: 29 History: Hypertension, Type II Diabetes, Osteoarthritis, Neuropathy Clustered Wound: No Photos Wound Measurements Length: (cm) 1.5 Width: (cm) 1.5 Depth: (cm) 0.5 Area: (cm) 1.767 Volume: (cm) 0.884 % Reduction in Area: 93.8% % Reduction in Volume: 96.9% Epithelialization: Small (1-33%) Wound Description Classification: Grade 2 Wound Margin: Distinct, outline attached Exudate Amount: Medium Exudate Type: Serosanguineous Exudate Color: red, brown Foul Odor After Cleansing: No Slough/Fibrino Yes Wound Bed Granulation Amount: Large (67-100%) Exposed Structure Granulation Quality: Pink, Pale, Friable Fascia Exposed: No Necrotic Amount: Small (1-33%) Fat Layer (Subcutaneous Tissue) Exposed: Yes Necrotic Quality: Adherent Slough Tendon Exposed: No Muscle Exposed: No Joint Exposed: No Bone Exposed: No Treatment Notes Wound #6 (Upper Leg) Wound Laterality: Right Cleanser Soap and Water Discharge Instruction: May shower and wash wound with dial antibacterial soap and water prior to dressing change. Peri-Wound Care Zinc Oxide Ointment 30g tube Discharge Instruction: As needed Apply Zinc Oxide to periwound with each dressing change Topical Primary Dressing KerraCel Ag Gelling Fiber Dressing, 4x5 in (silver alginate) Discharge Instruction: In clinic and patient to continue to use until interdry arrives. Apply silver alginate to wound bed as instructed Secondary Dressing Woven Gauze Sponge, Non-Sterile 4x4  in Discharge Instruction: Apply over primary dressing as directed. ABD Pad, 5x9 Discharge Instruction: Apply over  primary dressing as directed. Secured With 29M Medipore H Soft Cloth Surgical T 4 x 2 (in/yd) ape Discharge Instruction: Secure dressing with tape as directed. Compression Wrap Compression Stockings Add-Ons Electronic Signature(s) Signed: 11/24/2021 8:10:57 AM By: Sandre Kitty Signed: 11/24/2021 3:29:02 PM By: Dellie Catholic RN Entered By: Sandre Kitty on 11/23/2021 11:28:09 -------------------------------------------------------------------------------- Wound Assessment Details Patient Name: Date of Service: [name], [name] 11/23/2021 10:45 A M Medical Record Number: [id number] Patient Account Number: [id number] Date of Birth/Sex: Treating RN: 07/17/1951 (71 y.o. [name] Primary Care Meryl Ponder: [name] Other Clinician: Referring Alante Tolan: Treating Jennetta Flood/Extender: Alysia Penna in Treatment: 29 Wound Status Wound Number: 8 Primary Diabetic Wound/Ulcer of the Lower Extremity Etiology: Wound Location: Left, Lateral Upper Leg Wound Open Wounding Event: Gradually Appeared Status: Date Acquired: 01/06/2021 Comorbid Cataracts, Glaucoma, Chronic sinus problems/congestion, Asthma, Weeks Of Treatment: 29 History: Hypertension, Type II Diabetes, Osteoarthritis, Neuropathy Clustered Wound: No Photos Wound Measurements Length: (cm) 11.5 Width: (cm) 5.5 Depth: (cm) 2.5 Area: (cm) 49.676 Volume: (cm) 124.191 % Reduction in Area: 56.1% % Reduction in Volume: 56.1% Epithelialization: Medium (34-66%) Wound Description Classification: Grade 2 Wound Margin: Distinct, outline attached Exudate Amount: Large Exudate Type: Serosanguineous Exudate Color: red, brown Foul Odor After Cleansing: Yes Due to Product Use: Yes Slough/Fibrino No Wound Bed Granulation Amount: Large (67-100%) Exposed Structure Granulation  Quality: Red, Friable Fascia Exposed: No Necrotic Amount: None Present (0%) Fat Layer (Subcutaneous Tissue) Exposed: Yes Tendon Exposed: No Muscle Exposed: No Joint Exposed: No Bone Exposed: No Treatment Notes Wound #8 (Upper Leg) Wound Laterality: Left, Lateral Cleanser Soap and Water Discharge Instruction: May shower and wash wound with dial antibacterial soap and water prior to dressing change. Wound Cleanser Discharge Instruction: Cleanse the wound with wound cleanser prior to applying a clean dressing using gauze sponges, not tissue or cotton balls. Peri-Wound Care Topical Primary Dressing Wet-to-dry Discharge Instruction: wet to dry in clinic. Wound vac three times a week. Secondary Dressing Woven Gauze Sponges 2x2 in Discharge Instruction: Apply over primary dressing as directed. ABD Pad, 5x9 Discharge Instruction: Apply over primary dressing as directed. Secured With 29M Medipore H Soft Cloth Surgical T ape, 4 x 10 (in/yd) Discharge Instruction: Secure with tape as directed. Compression Wrap Compression Stockings Add-Ons Electronic Signature(s) Signed: 11/24/2021 8:10:57 AM By: Sandre Kitty Signed: 11/24/2021 3:58:04 PM By: Lorrin Jackson Entered By: Sandre Kitty on 11/23/2021 11:21:29 -------------------------------------------------------------------------------- Wound Assessment Details Patient Name: Date of Service: [name], [name] 11/23/2021 10:45 A M Medical Record Number: [id number] Patient Account Number: [id number] Date of Birth/Sex: Treating RN: May 16, 1951 (71 y.o. [name] Primary Care Contessa Preuss: [name] Other Clinician: Referring Caroleann Casler: Treating Dontrez Pettis/Extender: Alysia Penna in Treatment: 29 Wound Status Wound Number: 9 Primary Lesion Etiology: Wound Location: Right, Proximal Upper Leg Wound Healed - Epithelialized Wounding Event: Other Lesion Status: Date Acquired: 08/19/2021 Comorbid  Cataracts, Glaucoma, Chronic sinus problems/congestion, Asthma, Weeks Of Treatment: 12 History: Hypertension, Type II Diabetes, Osteoarthritis, Neuropathy Clustered Wound: No Photos Wound Measurements Length: (cm) Width: (cm) Depth: (cm) Area: (cm) Volume: (cm) 0 % Reduction in Area: 100% 0 % Reduction in Volume: 100% 0 0 0 Wound Description Classification: Full Thickness Without Exposed Support Structur es Electronic Signature(s) Signed: 11/24/2021 3:58:04 PM By: Lorrin Jackson Entered By: Lorrin Jackson on 11/23/2021 11:45:57 -------------------------------------------------------------------------------- Vitals Details Patient Name: Date of Service: [name], [name] 11/23/2021 10:45 A M Medical Record Number: [id number] Patient Account Number:  [id number] Date of Birth/Sex: Treating RN: 06-04-1951 (71 y.o. [name] Primary Care Rawley Harju: [name] Other Clinician: Referring Rinnah Peppel: Treating Ayn Domangue/Extender: Alysia Penna in Treatment: 29 Vital Signs Time Taken: 11:00 Temperature (F): 98.1 Height (in): 63 Pulse (bpm): 73 Weight (lbs): 279 Respiratory Rate (breaths/min): 18 Body Mass Index (BMI): 49.4 Blood Pressure (mmHg): 135/82 Reference Range: 80 - 120 mg / dl Electronic Signature(s) Signed: 11/24/2021 3:29:02 PM By: Dellie Catholic RN Entered By: Dellie Catholic on 11/23/2021 11:03:43

## 2021-11-24 NOTE — Progress Notes (Signed)
DORMA, ALTMAN ([id number]) Visit Report for 11/23/2021 Chief Complaint Document Details Patient Name: Date of Service: MO Yesenia Payne, [name] 11/23/2021 10:45 A M Medical Record Number: [id number] Patient Account Number: [id number] Date of Birth/Sex: Treating RN: 10-14-1951 (71 y.o. Yesenia Payne Primary Care Provider: [name] Other Clinician: Referring Provider: Treating Provider/Extender: Alysia Penna in Treatment: 29 Information Obtained from: Patient Chief Complaint Left lower extremity wounds and right upper leg wound, Pannus wounds Electronic Signature(s) Signed: 11/23/2021 12:26:18 PM By: Kalman Shan DO Entered By: Kalman Shan on 11/23/2021 12:15:04 -------------------------------------------------------------------------------- HPI Details Patient Name: Date of Service: [place] Yesenia San, [name] 11/23/2021 10:45 A M Medical Record Number: [id number] Patient Account Number: [id number] Date of Birth/Sex: Treating RN: 1950-11-14 (71 y.o. 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 She has been seen in our clinic multiple times last seen in 03/2019 for right toe wounds. Today she presents for 3 wounds. 1 is located to the right upper leg and 2 to the left lower extremity. They started at the end of December And not sure how I started. She had not been using any dressings up until 2 weeks ago. She is currently using silver alginate to the right upper leg and Santyl to the wounds on the left leg. She has been on doxycycline and 2 rounds of clindamycin for  cellulitis to these wounds. She finished her last round of antibiotics 3 weeks ago. She reports minimal pain to the wound. She currently denies signs of infection including increased erythema, warmth or purulent drainage. 7/5; patient presents for 1 week follow-up. She has been using Santyl to the left lower extremity wounds and silver alginate to the right upper leg wound. She denies signs of infection. She has no complaints today. 7/19; patient presents for 1 week follow-up. She has been using Santyl to the left lower extremity wounds and silver alginate to the right upper leg wound. She denies signs of infection. She has had to use a lot of Santyl to the lateral wound since the necrotic tissue has been breaking down and the wound is deeper. 8/4; patient presents for 2-week follow-up. She has been using Santyl to the left lower extremity wound and silver alginate to the right upper leg wound. She has been using the wound VAC to the left lateral wound. She has no issues or complaints today. She denies signs of infection. 8/18; patient presents for 2-week follow-up. She has been using Santyl and silver alginate to the right upper wound. She reports that this is deeper today. She has been using the wound VAC to the left lateral wound. She has no issues or complaints today. She denies signs of infection. She reports that the left lower extremity medial wound is healed 9/1; patient presents for 2-week follow-up. She continues to use silver alginate to the right upper wound. She is using the wound VAC to the left lateral wound. She has no issues today. She denies signs of infection. She overall feels well. 10/10; patient presents for follow-up. Patient has not followed up in a month. She states that her husband was recently in the ICU but is now at home. She reports an increase  in the wound size to the right lower extremity. She reports improvement to the left lower extremity wound. She currently denies  signs of infection. She states she overall feels well. 10/24; patient presents for follow-up. She has a new wound to the right upper thigh. She has been keeping the area covered until yesterday when she started silver alginate. She continues to use Dakin wet-to-dry dressings to the other right upper leg wound. She continues to use the [facility] to the left lower extremity wound. No obvious signs of infection on exam. 11/7; patient presents for follow-up. She again has new wounds to her pannus folds. She is keeping the area dry with absorbent pads and using silver alginate to the wound beds. She continues to use the wound VAC to the left lower extremity wound without issues. She denies signs of infection. 11/21; patient presents for follow-up. She continues to have new wounds to her pannus folds. Her daughter helps with dressing changes and is keeping the areas dry with absorbent pads and using silver alginate to the wound beds. She continues to use the wound VAC to the left lower extremity wound without issues. She currently denies signs of infection. 12/19; patient presents for follow-up. She continues to have new wounds to her pannus folds. Her daughter helps with the dressings and has been using Hibiclens and silver alginate to the wound beds. Patient continues to use the wound VAC to the left lower extremity wound. She has no issues or complaints today. She denies signs of infection. 1/16; patient presents for follow-up. She again has developed a new wound to the groin. She continues to use Hibiclens and silver alginate to the pannus/groin wounds. She continues to use the wound VAC to the left lower extremity without issues. She denies signs of infection. Electronic Signature(s) Signed: 11/23/2021 12:26:18 PM By: Kalman Shan DO Entered By: Kalman Shan on 11/23/2021 12:15:51 -------------------------------------------------------------------------------- Physical Exam Details Patient Name:  Date of Service: [place] Yesenia San, [name] 11/23/2021 10:45 A M Medical Record Number: [id number] Patient Account Number: [id number] Date of Birth/Sex: Treating RN: Mar 23, 1951 (71 y.o. Yesenia Payne Primary Care Provider: [name] Other Clinician: Referring Provider: Treating Provider/Extender: Alysia Penna in Treatment: 29 Constitutional respirations regular, non-labored and within target range for patient.Marland Kitchen Psychiatric pleasant and cooperative. Notes Left lower extremity: Large open wound with granulation tissue and three areas of tunneling. No obvious signs of infection. Overall appears well-healing. Several wounds to the pannus folds and thigh folds. All with granulation tissue except for one that has nonviable tissue present. Electronic Signature(s) Signed: 11/23/2021 12:26:18 PM By: Kalman Shan DO Entered By: Kalman Shan on 11/23/2021 12:16:27 -------------------------------------------------------------------------------- Physician Orders Details Patient Name: Date of Service: [place] Yesenia San, [name] 11/23/2021 10:45 A M Medical Record Number: [id number] Patient Account Number: [id number] Date of Birth/Sex: Treating RN: Jan 19, 1951 (71 y.o. Yesenia Payne Primary Care Provider: [name] Other Clinician: Referring Provider: Treating Provider/Extender: Alysia Penna in Treatment: 49 Verbal / Phone Orders: No Diagnosis Coding ICD-10 Coding Code Description [phone] 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ity L97.819 Non-pressure chronic ulcer of other part of right lower leg with unspecified severity E11.9 Type 2 diabetes mellitus without complications D32.671 Acquired absence of left leg below knee S31.109D Unspecified open wound of abdominal wall, unspecified quadrant without penetration into peritoneal cavity, subsequent encounter Follow-up Appointments Return  appointment in 3 weeks. - Dr. Heber Wataga - ***STRETCHER -  60 minutes extra time*** Bathing/ Shower/ Hygiene Other Bathing/Shower/Hygiene Orders/Instructions: - You may sponge bathe use hibiclens wash with dressing changes with abdominal skin folds. Negative Presssure Wound Therapy Wound #8 Left,Lateral Upper Leg Wound Vac to wound continuously at 139m/hg pressure - Change three times a week. Black Foam White Foam - White foam to deep areas at 7:00, 9:00, and 11:00 Off-Loading Turn and reposition every 2 hours Wound Treatment Wound #10 - Abdomen - Lower Quadrant Wound Laterality: Right Cleanser: Soap and Water 1 x Per Day/30 Days Discharge Instructions: May shower and wash wound with dial antibacterial soap and water prior to dressing change. Peri-Wound Care: Zinc Oxide Ointment 30g tube 1 x Per Day/30 Days Discharge Instructions: As needed Apply Zinc Oxide to periwound with each dressing change Prim Dressing: KerraCel Ag Gelling Fiber Dressing, 4x5 in (silver alginate) (DME) (Generic) 1 x Per Day/30 Days ary Discharge Instructions: In clinic and patient to continue to use until interdry arrives. Apply silver alginate to wound bed as instructed Secondary Dressing: Woven Gauze Sponge, Non-Sterile 4x4 in (DME) (Generic) 1 x Per Day/30 Days Discharge Instructions: Apply over primary dressing as directed. Secondary Dressing: ABD Pad, 5x9 (DME) (Generic) 1 x Per Day/30 Days Discharge Instructions: Apply over primary dressing as directed. Secured With: 35M Medipore H Soft Cloth Surgical T 4 x 2 (in/yd) (DME) (Generic) 1 x Per Day/30 Days ape Discharge Instructions: Secure dressing with tape as directed. Wound #11 - Upper Leg Wound Laterality: Left, Medial Cleanser: Soap and Water 1 x Per Day/30 Days Discharge Instructions: May shower and wash wound with dial antibacterial soap and water prior to dressing change. Peri-Wound Care: Zinc Oxide Ointment 30g tube 1 x Per Day/30 Days Discharge  Instructions: As needed Apply Zinc Oxide to periwound with each dressing change Prim Dressing: KerraCel Ag Gelling Fiber Dressing, 4x5 in (silver alginate) (DME) (Generic) 1 x Per Day/30 Days ary Discharge Instructions: In clinic and patient to continue to use until interdry arrives. Apply silver alginate to wound bed as instructed Secondary Dressing: Woven Gauze Sponge, Non-Sterile 4x4 in (DME) (Generic) 1 x Per Day/30 Days Discharge Instructions: Apply over primary dressing as directed. Secondary Dressing: ABD Pad, 5x9 (DME) (Generic) 1 x Per Day/30 Days Discharge Instructions: Apply over primary dressing as directed. Secured With: 35M Medipore H Soft Cloth Surgical T 4 x 2 (in/yd) (DME) (Generic) 1 x Per Day/30 Days ape Discharge Instructions: Secure dressing with tape as directed. Wound #12 - Abdomen - Lower Quadrant Wound Laterality: Left Cleanser: Soap and Water 1 x Per Day/30 Days Discharge Instructions: May shower and wash wound with dial antibacterial soap and water prior to dressing change. Peri-Wound Care: Zinc Oxide Ointment 30g tube 1 x Per Day/30 Days Discharge Instructions: As needed Apply Zinc Oxide to periwound with each dressing change Prim Dressing: KerraCel Ag Gelling Fiber Dressing, 4x5 in (silver alginate) (DME) (Generic) 1 x Per Day/30 Days ary Discharge Instructions: In clinic and patient to continue to use until interdry arrives. Apply silver alginate to wound bed as instructed Secondary Dressing: Woven Gauze Sponge, Non-Sterile 4x4 in (DME) (Generic) 1 x Per Day/30 Days Discharge Instructions: Apply over primary dressing as directed. Secondary Dressing: ABD Pad, 5x9 (DME) (Generic) 1 x Per Day/30 Days Discharge Instructions: Apply over primary dressing as directed. Secured With: 35M Medipore H Soft Cloth Surgical T 4 x 2 (in/yd) (Generic) 1 x Per Day/30 Days ape Discharge Instructions: Secure dressing with tape as directed. Wound #13 - Groin Wound Laterality:  Right Cleanser: Soap  and Water 1 x Per Day/30 Days Discharge Instructions: May shower and wash wound with dial antibacterial soap and water prior to dressing change. Peri-Wound Care: Zinc Oxide Ointment 30g tube 1 x Per Day/30 Days Discharge Instructions: As needed Apply Zinc Oxide to periwound with each dressing change Prim Dressing: KerraCel Ag Gelling Fiber Dressing, 4x5 in (silver alginate) (Generic) 1 x Per Day/30 Days ary Discharge Instructions: In clinic and patient to continue to use until interdry arrives. Apply silver alginate to wound bed as instructed Secondary Dressing: Woven Gauze Sponge, Non-Sterile 4x4 in (Generic) 1 x Per Day/30 Days Discharge Instructions: Apply over primary dressing as directed. Secondary Dressing: ABD Pad, 5x9 (Generic) 1 x Per Day/30 Days Discharge Instructions: Apply over primary dressing as directed. Secured With: 64M Medipore H Soft Cloth Surgical T 4 x 2 (in/yd) (Generic) 1 x Per Day/30 Days ape Discharge Instructions: Secure dressing with tape as directed. Wound #6 - Upper Leg Wound Laterality: Right Cleanser: Soap and Water 1 x Per WPY/09 Days Discharge Instructions: May shower and wash wound with dial antibacterial soap and water prior to dressing change. Peri-Wound Care: Zinc Oxide Ointment 30g tube 1 x Per Day/15 Days Discharge Instructions: As needed Apply Zinc Oxide to periwound with each dressing change Prim Dressing: KerraCel Ag Gelling Fiber Dressing, 4x5 in (silver alginate) (DME) (Generic) 1 x Per Day/15 Days ary Discharge Instructions: In clinic and patient to continue to use until interdry arrives. Apply silver alginate to wound bed as instructed Secondary Dressing: Woven Gauze Sponge, Non-Sterile 4x4 in (DME) (Generic) 1 x Per Day/15 Days Discharge Instructions: Apply over primary dressing as directed. Secondary Dressing: ABD Pad, 5x9 (DME) (Generic) 1 x Per Day/15 Days Discharge Instructions: Apply over primary dressing as  directed. Secured With: 64M Medipore H Soft Cloth Surgical T 4 x 2 (in/yd) (DME) (Generic) 1 x Per Day/15 Days ape Discharge Instructions: Secure dressing with tape as directed. Wound #8 - Upper Leg Wound Laterality: Left, Lateral Cleanser: Soap and Water 1 x Per XIP/38 Days Discharge Instructions: May shower and wash wound with dial antibacterial soap and water prior to dressing change. Cleanser: Wound Cleanser (Generic) 1 x Per Day/15 Days Discharge Instructions: Cleanse the wound with wound cleanser prior to applying a clean dressing using gauze sponges, not tissue or cotton balls. Prim Dressing: Wet-to-dry 1 x Per SNK/53 Days ary Discharge Instructions: wet to dry in clinic. Wound vac three times a week. Secondary Dressing: Woven Gauze Sponges 2x2 in (Generic) 1 x Per Day/15 Days Discharge Instructions: Apply over primary dressing as directed. Secondary Dressing: ABD Pad, 5x9 (Generic) 1 x Per Day/15 Days Discharge Instructions: Apply over primary dressing as directed. Secured With: 64M Medipore H Soft Cloth Surgical T ape, 4 x 10 (in/yd) (Generic) 1 x Per Day/15 Days Discharge Instructions: Secure with tape as directed. Electronic Signature(s) Signed: 11/23/2021 12:26:18 PM By: Kalman Shan DO Entered By: Kalman Shan on 11/23/2021 12:17:10 -------------------------------------------------------------------------------- Problem List Details Patient Name: Date of Service: [place] Yesenia San, [name] 11/23/2021 10:45 A M Medical Record Number: [id number] Patient Account Number: [id number] Date of Birth/Sex: Treating RN: 1950-12-23 (71 y.o. Yesenia Payne Primary Care Provider: [name] Other Clinician: Referring Provider: Treating Provider/Extender: Alysia Penna in Treatment: 29 Active Problems ICD-10 Encounter Code Description Active Date MDM Diagnosis L97.829 Non-pressure chronic ulcer of other part of left lower leg with unspecified  04/30/2021 No Yes severity L97.819 Non-pressure chronic ulcer of other part of right lower leg with unspecified  04/30/2021 No Yes severity E11.9 Type 2 diabetes mellitus without complications 0/73/7106 No Yes Z89.512 Acquired absence of left leg below knee 04/30/2021 No Yes S31.109D Unspecified open wound of abdominal wall, unspecified quadrant without 09/28/2021 No Yes penetration into peritoneal cavity, subsequent encounter Inactive Problems Resolved Problems Electronic Signature(s) Signed: 11/23/2021 12:26:18 PM By: Kalman Shan DO Previous Signature: 11/23/2021 11:07:42 AM Version By: Lorrin Jackson Entered By: Kalman Shan on 11/23/2021 12:14:45 -------------------------------------------------------------------------------- Progress Note Details Patient Name: Date of Service: [place] Yesenia San, [name] 11/23/2021 10:45 A M Medical Record Number: [id number] Patient Account Number: [id number] Date of Birth/Sex: Treating RN: Nov 16, 1950 (71 y.o. Yesenia Payne Primary Care Provider: [name] Other Clinician: Referring Provider: Treating Provider/Extender: Alysia Penna in Treatment: 29 Subjective Chief Complaint Information obtained from Patient Left lower extremity wounds and right upper leg wound, Pannus wounds History of Present Illness (HPI) Admission 04/30/2021 Ms. 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 She has been seen in our clinic multiple times last seen in 03/2019 for right toe wounds. Today she presents for 3 wounds. 1 is located to the right upper leg and 2 to the left lower extremity. They started at the end of December And not sure how I started. She had not been using any dressings up until 2 weeks ago. She is currently using silver alginate to the right upper leg and Santyl to the wounds on the  left leg. She has been on doxycycline and 2 rounds of clindamycin for cellulitis to these wounds. She finished her last round of antibiotics 3 weeks ago. She reports minimal pain to the wound. She currently denies signs of infection including increased erythema, warmth or purulent drainage. 7/5; patient presents for 1 week follow-up. She has been using Santyl to the left lower extremity wounds and silver alginate to the right upper leg wound. She denies signs of infection. She has no complaints today. 7/19; patient presents for 1 week follow-up. She has been using Santyl to the left lower extremity wounds and silver alginate to the right upper leg wound. She denies signs of infection. She has had to use a lot of Santyl to the lateral wound since the necrotic tissue has been breaking down and the wound is deeper. 8/4; patient presents for 2-week follow-up. She has been using Santyl to the left lower extremity wound and silver alginate to the right upper leg wound. She has been using the wound VAC to the left lateral wound. She has no issues or complaints today. She denies signs of infection. 8/18; patient presents for 2-week follow-up. She has been using Santyl and silver alginate to the right upper wound. She reports that this is deeper today. She has been using the wound VAC to the left lateral wound. She has no issues or complaints today. She denies signs of infection. She reports that the left lower extremity medial wound is healed 9/1; patient presents for 2-week follow-up. She continues to use silver alginate to the right upper wound. She is using the wound VAC to the left lateral wound. She has no issues today. She denies signs of infection. She overall feels well. 10/10; patient presents for follow-up. Patient has not followed up in a month. She states that her husband was recently in the ICU but is now at home. She reports an increase in the wound size to the right lower  extremity. She reports  improvement to the left lower extremity wound. She currently denies signs of infection. She states she overall feels well. 10/24; patient presents for follow-up. She has a new wound to the right upper thigh. She has been keeping the area covered until yesterday when she started silver alginate. She continues to use Dakin wet-to-dry dressings to the other right upper leg wound. She continues to use the [facility] to the left lower extremity wound. No obvious signs of infection on exam. 11/7; patient presents for follow-up. She again has new wounds to her pannus folds. She is keeping the area dry with absorbent pads and using silver alginate to the wound beds. She continues to use the wound VAC to the left lower extremity wound without issues. She denies signs of infection. 11/21; patient presents for follow-up. She continues to have new wounds to her pannus folds. Her daughter helps with dressing changes and is keeping the areas dry with absorbent pads and using silver alginate to the wound beds. She continues to use the wound VAC to the left lower extremity wound without issues. She currently denies signs of infection. 12/19; patient presents for follow-up. She continues to have new wounds to her pannus folds. Her daughter helps with the dressings and has been using Hibiclens and silver alginate to the wound beds. Patient continues to use the wound VAC to the left lower extremity wound. She has no issues or complaints today. She denies signs of infection. 1/16; patient presents for follow-up. She again has developed a new wound to the groin. She continues to use Hibiclens and silver alginate to the pannus/groin wounds. She continues to use the wound VAC to the left lower extremity without issues. She denies signs of infection. Patient History Information obtained from Patient. Family History Cancer - Child, Diabetes - Father, Heart Disease - Mother,Father, Hypertension - Mother,Father, No family history  of Hereditary Spherocytosis, Kidney Disease, Lung Disease, Seizures, Stroke, Thyroid Problems, Tuberculosis. Social History Never smoker, Marital Status - Married, Alcohol Use - Never, Drug Use - No History, Caffeine Use - Daily - coffee , sodas. Medical History Eyes Patient has history of Cataracts - lens implant done, Glaucoma Denies history of Optic Neuritis Ear/Nose/Mouth/Throat Patient has history of Chronic sinus problems/congestion - seasonal Denies history of Middle ear problems Hematologic/Lymphatic Denies history of Anemia, Hemophilia, Human Immunodeficiency Virus, Lymphedema, Sickle Cell Disease Respiratory Patient has history of Asthma Denies history of Aspiration, Chronic Obstructive Pulmonary Disease (COPD), Pneumothorax, Sleep Apnea, Tuberculosis Cardiovascular Patient has history of Hypertension - not on meds presently Denies history of Angina, Arrhythmia, Congestive Heart Failure, Coronary Artery Disease, Deep Vein Thrombosis, Hypotension, Myocardial Infarction, Peripheral Arterial Disease, Peripheral Venous Disease, Phlebitis, Vasculitis Gastrointestinal Denies history of Cirrhosis , Colitis, Crohnoos, Hepatitis A, Hepatitis B, Hepatitis C Endocrine Patient has history of Type II Diabetes Denies history of Type I Diabetes Genitourinary Denies history of End Stage Renal Disease Immunological Denies history of Lupus Erythematosus, Raynaudoos, Scleroderma Integumentary (Skin) Denies history of History of Burn Musculoskeletal Patient has history of Osteoarthritis Denies history of Gout, Rheumatoid Arthritis, Osteomyelitis Neurologic Patient has history of Neuropathy Denies history of Dementia, Quadriplegia, Paraplegia, Seizure Disorder Oncologic Denies history of Received Chemotherapy, Received Radiation Psychiatric Denies history of Anorexia/bulimia, Confinement Anxiety Medical A Surgical History  Notes nd Cardiovascular Hyperlipidemia Gastrointestinal GERD, Diverticulitis Genitourinary Overactive Bladder Musculoskeletal Fibromyalgia, toe amputations x 3 Psychiatric Anxiety Objective Constitutional respirations regular, non-labored and within target range for patient.. Vitals Time Taken: 11:00 AM, Height: 63 in, Weight:  279 lbs, BMI: 49.4, Temperature: 98.1 F, Pulse: 73 bpm, Respiratory Rate: 18 breaths/min, Blood Pressure: 135/82 mmHg. Psychiatric pleasant and cooperative. General Notes: Left lower extremity: Large open wound with granulation tissue and three areas of tunneling. No obvious signs of infection. Overall appears well-healing. Several wounds to the pannus folds and thigh folds. All with granulation tissue except for one that has nonviable tissue present. Integumentary (Hair, Skin) Wound #10 status is Open. Original cause of wound was Gradually Appeared. The date acquired was: 09/14/2021. The wound has been in treatment 10 weeks. The wound is located on the Right Abdomen - Lower Quadrant. The wound measures 0.5cm length x 1.6cm width x 0.4cm depth; 0.628cm^2 area and 0.251cm^3 volume. There is Fat Layer (Subcutaneous Tissue) exposed. There is no tunneling or undermining noted. There is a medium amount of serosanguineous drainage noted. The wound margin is distinct with the outline attached to the wound base. There is large (67-100%) red, pink granulation within the wound bed. There is a small (1-33%) amount of necrotic tissue within the wound bed including Adherent Slough. Wound #11 status is Open. Original cause of wound was Gradually Appeared. The date acquired was: 09/14/2021. The wound has been in treatment 10 weeks. The wound is located on the Left,Medial Upper Leg. The wound measures 0.9cm length x 1.6cm width x 0.2cm depth; 1.131cm^2 area and 0.226cm^3 volume. There is Fat Layer (Subcutaneous Tissue) exposed. There is a medium amount of serosanguineous drainage  noted. The wound margin is distinct with the outline attached to the wound base. There is medium (34-66%) pink, pale granulation within the wound bed. There is a medium (34-66%) amount of necrotic tissue within the wound bed including Adherent Slough. Wound #12 status is Open. Original cause of wound was Gradually Appeared. The date acquired was: 10/26/2021. The wound has been in treatment 4 weeks. The wound is located on the Left Abdomen - Lower Quadrant. The wound measures 1cm length x 1.4cm width x 0.3cm depth; 1.1cm^2 area and 0.33cm^3 volume. There is Fat Layer (Subcutaneous Tissue) exposed. There is a medium amount of serosanguineous drainage noted. The wound margin is distinct with the outline attached to the wound base. There is medium (34-66%) red, pink granulation within the wound bed. There is a medium (34-66%) amount of necrotic tissue within the wound bed including Adherent Slough. Wound #13 status is Open. Original cause of wound was Gradually Appeared. The date acquired was: 11/23/2021. The wound is located on the Right Groin. The wound measures 3.5cm length x 1.6cm width x 0.2cm depth; 4.398cm^2 area and 0.88cm^3 volume. There is Fat Layer (Subcutaneous Tissue) exposed. There is no tunneling or undermining noted. There is a medium amount of serosanguineous drainage noted. The wound margin is distinct with the outline attached to the wound base. There is large (67-100%) red granulation within the wound bed. There is a small (1-33%) amount of necrotic tissue within the wound bed including Adherent Slough. Wound #6 status is Open. Original cause of wound was Gradually Appeared. The date acquired was: 11/06/2020. The wound has been in treatment 29 weeks. The wound is located on the Right Upper Leg. The wound measures 1.5cm length x 1.5cm width x 0.5cm depth; 1.767cm^2 area and 0.884cm^3 volume. There is Fat Layer (Subcutaneous Tissue) exposed. There is a medium amount of serosanguineous  drainage noted. The wound margin is distinct with the outline attached to the wound base. There is large (67-100%) pink, pale, friable granulation within the wound bed. There is a small (1-33%)  amount of necrotic tissue within the wound bed including Adherent Slough. Wound #8 status is Open. Original cause of wound was Gradually Appeared. The date acquired was: 01/06/2021. The wound has been in treatment 29 weeks. The wound is located on the Left,Lateral Upper Leg. The wound measures 11.5cm length x 5.5cm width x 2.5cm depth; 49.676cm^2 area and 124.191cm^3 volume. There is Fat Layer (Subcutaneous Tissue) exposed. There is a large amount of serosanguineous drainage noted. Foul odor after cleansing was noted. Foul odor after cleansing was noted due to product use. The wound margin is distinct with the outline attached to the wound base. There is large (67-100%) red, friable granulation within the wound bed. There is no necrotic tissue within the wound bed. Wound #9 status is Healed - Epithelialized. Original cause of wound was Other Lesion. The date acquired was: 08/19/2021. The wound has been in treatment 12 weeks. The wound is located on the Right,Proximal Upper Leg. The wound measures 0cm length x 0cm width x 0cm depth; 0cm^2 area and 0cm^3 volume. 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 No signs of infection. I recommended continuing the wound VAC here. Unfortunately patient has scattered wounds to her groin pannus and  thigh areas due to significant skin folds. I recommended keeping the area clean dry and without moisture. I recommended continuing to use Hibiclens. She can use silver alginate to all the open wounds. No signs of infection to these wounds as well. Plan Follow-up Appointments: Return appointment in 3 weeks. - Dr. Heber Marinette - ***STRETCHER - 60 minutes extra time*** Bathing/ Shower/ Hygiene: Other Bathing/Shower/Hygiene Orders/Instructions: - You may sponge bathe use hibiclens wash with dressing changes with abdominal skin folds. Negative Presssure Wound Therapy: Wound #8 Left,Lateral Upper Leg: Wound Vac to wound continuously at 163m/hg pressure - Change three times a week. Black Foam White Foam - White foam to deep areas at 7:00, 9:00, and 11:00 Off-Loading: Turn and reposition every 2 hours WOUND #10: - Abdomen - Lower Quadrant Wound Laterality: Right Cleanser: Soap and Water 1 x Per Day/30 Days Discharge Instructions: May shower and wash wound with dial antibacterial soap and water prior to dressing change. Peri-Wound Care: Zinc Oxide Ointment 30g tube 1 x Per Day/30 Days Discharge Instructions: As needed Apply Zinc Oxide to periwound with each dressing change Prim Dressing: KerraCel Ag Gelling Fiber Dressing, 4x5 in (silver alginate) (DME) (Generic) 1 x Per Day/30 Days ary Discharge Instructions: In clinic and patient to continue to use until interdry arrives. Apply silver alginate to wound bed as instructed Secondary Dressing: Woven Gauze Sponge, Non-Sterile 4x4 in (DME) (Generic) 1 x Per Day/30 Days Discharge Instructions: Apply over primary dressing as directed. Secondary Dressing: ABD Pad, 5x9 (DME) (Generic) 1 x Per Day/30 Days Discharge Instructions: Apply over primary dressing as directed. Secured With: 58M Medipore H Soft Cloth Surgical T 4 x 2 (in/yd) (DME) (Generic) 1 x Per Day/30 Days ape Discharge Instructions: Secure dressing with tape as directed. WOUND #11: - Upper Leg  Wound Laterality: Left, Medial Cleanser: Soap and Water 1 x Per Day/30 Days Discharge Instructions: May shower and wash wound with  dial antibacterial soap and water prior to dressing change. Peri-Wound Care: Zinc Oxide Ointment 30g tube 1 x Per Day/30 Days Discharge Instructions: As needed Apply Zinc Oxide to periwound with each dressing change Prim Dressing: KerraCel Ag Gelling Fiber Dressing, 4x5 in (silver alginate) (DME) (Generic) 1 x Per Day/30 Days ary Discharge Instructions: In clinic and patient to continue to use until interdry arrives. Apply silver alginate to wound bed as instructed Secondary Dressing: Woven Gauze Sponge, Non-Sterile 4x4 in (DME) (Generic) 1 x Per Day/30 Days Discharge Instructions: Apply over primary dressing as directed. Secondary Dressing: ABD Pad, 5x9 (DME) (Generic) 1 x Per Day/30 Days Discharge Instructions: Apply over primary dressing as directed. Secured With: 66M Medipore H Soft Cloth Surgical T 4 x 2 (in/yd) (DME) (Generic) 1 x Per Day/30 Days ape Discharge Instructions: Secure dressing with tape as directed. WOUND #12: - Abdomen - Lower Quadrant Wound Laterality: Left Cleanser: Soap and Water 1 x Per Day/30 Days Discharge Instructions: May shower and wash wound with dial antibacterial soap and water prior to dressing change. Peri-Wound Care: Zinc Oxide Ointment 30g tube 1 x Per Day/30 Days Discharge Instructions: As needed Apply Zinc Oxide to periwound with each dressing change Prim Dressing: KerraCel Ag Gelling Fiber Dressing, 4x5 in (silver alginate) (DME) (Generic) 1 x Per Day/30 Days ary Discharge Instructions: In clinic and patient to continue to use until interdry arrives. Apply silver alginate to wound bed as instructed Secondary Dressing: Woven Gauze Sponge, Non-Sterile 4x4 in (DME) (Generic) 1 x Per Day/30 Days Discharge Instructions: Apply over primary dressing as directed. Secondary Dressing: ABD Pad, 5x9 (DME) (Generic) 1 x Per Day/30  Days Discharge Instructions: Apply over primary dressing as directed. Secured With: 66M Medipore H Soft Cloth Surgical T 4 x 2 (in/yd) (Generic) 1 x Per Day/30 Days ape Discharge Instructions: Secure dressing with tape as directed. WOUND #13: - Groin Wound Laterality: Right Cleanser: Soap and Water 1 x Per Day/30 Days Discharge Instructions: May shower and wash wound with dial antibacterial soap and water prior to dressing change. Peri-Wound Care: Zinc Oxide Ointment 30g tube 1 x Per Day/30 Days Discharge Instructions: As needed Apply Zinc Oxide to periwound with each dressing change Prim Dressing: KerraCel Ag Gelling Fiber Dressing, 4x5 in (silver alginate) (Generic) 1 x Per Day/30 Days ary Discharge Instructions: In clinic and patient to continue to use until interdry arrives. Apply silver alginate to wound bed as instructed Secondary Dressing: Woven Gauze Sponge, Non-Sterile 4x4 in (Generic) 1 x Per Day/30 Days Discharge Instructions: Apply over primary dressing as directed. Secondary Dressing: ABD Pad, 5x9 (Generic) 1 x Per Day/30 Days Discharge Instructions: Apply over primary dressing as directed. Secured With: 66M Medipore H Soft Cloth Surgical T 4 x 2 (in/yd) (Generic) 1 x Per Day/30 Days ape Discharge Instructions: Secure dressing with tape as directed. WOUND #6: - Upper Leg Wound Laterality: Right Cleanser: Soap and Water 1 x Per CZY/60 Days Discharge Instructions: May shower and wash wound with dial antibacterial soap and water prior to dressing change. Peri-Wound Care: Zinc Oxide Ointment 30g tube 1 x Per Day/15 Days Discharge Instructions: As needed Apply Zinc Oxide to periwound with each dressing change Prim Dressing: KerraCel Ag Gelling Fiber Dressing, 4x5 in (silver alginate) (DME) (Generic) 1 x Per Day/15 Days ary Discharge Instructions: In clinic and patient to continue to use until interdry arrives. Apply silver alginate to wound bed as instructed Secondary Dressing:  Woven Gauze Sponge, Non-Sterile 4x4 in (DME) (Generic) 1 x Per Day/15 Days  Discharge Instructions: Apply over primary dressing as directed. Secondary Dressing: ABD Pad, 5x9 (DME) (Generic) 1 x Per Day/15 Days Discharge Instructions: Apply over primary dressing as directed. Secured With: 74M Medipore H Soft Cloth Surgical T 4 x 2 (in/yd) (DME) (Generic) 1 x Per Day/15 Days ape Discharge Instructions: Secure dressing with tape as directed. WOUND #8: - Upper Leg Wound Laterality: Left, Lateral Cleanser: Soap and Water 1 x Per Day/15 Days Discharge Instructions: May shower and wash wound with dial antibacterial soap and water prior to dressing change. Cleanser: Wound Cleanser (Generic) 1 x Per Day/15 Days Discharge Instructions: Cleanse the wound with wound cleanser prior to applying a clean dressing using gauze sponges, not tissue or cotton balls. Prim Dressing: Wet-to-dry 1 x Per LZJ/67 Days ary Discharge Instructions: wet to dry in clinic. Wound vac three times a week. Secondary Dressing: Woven Gauze Sponges 2x2 in (Generic) 1 x Per Day/15 Days Discharge Instructions: Apply over primary dressing as directed. Secondary Dressing: ABD Pad, 5x9 (Generic) 1 x Per Day/15 Days Discharge Instructions: Apply over primary dressing as directed. Secured With: 74M Medipore H Soft Cloth Surgical T ape, 4 x 10 (in/yd) (Generic) 1 x Per Day/15 Days Discharge Instructions: Secure with tape as directed. 1. Wound VAC 2. Silver alginate 3. Follow-up in 3 weeks Electronic Signature(s) Signed: 11/23/2021 12:26:18 PM By: Kalman Shan DO Entered By: Kalman Shan on 11/23/2021 12:25:38 -------------------------------------------------------------------------------- HxROS Details Patient Name: Date of Service: [place] Yesenia San, [name] 11/23/2021 10:45 A M Medical Record Number: [id number] Patient Account Number: [id number] Date of Birth/Sex: Treating RN: 11-21-1950 (71 y.o. Yesenia Payne Primary Care  Provider: [name] Other Clinician: Referring Provider: Treating Provider/Extender: Alysia Penna in Treatment: 29 Information Obtained From Patient Eyes Medical History: Positive for: Cataracts - lens implant done; Glaucoma Negative for: Optic Neuritis Ear/Nose/Mouth/Throat Medical History: Positive for: Chronic sinus problems/congestion - seasonal Negative for: Middle ear problems Hematologic/Lymphatic Medical History: Negative for: Anemia; Hemophilia; Human Immunodeficiency Virus; Lymphedema; Sickle Cell Disease Respiratory Medical History: Positive for: Asthma Negative for: Aspiration; Chronic Obstructive Pulmonary Disease (COPD); Pneumothorax; Sleep Apnea; Tuberculosis Cardiovascular Medical History: Positive for: Hypertension - not on meds presently Negative for: Angina; Arrhythmia; Congestive Heart Failure; Coronary Artery Disease; Deep Vein Thrombosis; Hypotension; Myocardial Infarction; Peripheral Arterial Disease; Peripheral Venous Disease; Phlebitis; Vasculitis Past Medical History Notes: Hyperlipidemia Gastrointestinal Medical History: Negative for: Cirrhosis ; Colitis; Crohns; Hepatitis A; Hepatitis B; Hepatitis C Past Medical History Notes: GERD, Diverticulitis Endocrine Medical History: Positive for: Type II Diabetes Negative for: Type I Diabetes Time with diabetes: 12 years Treated with: Insulin Blood sugar tested every day: Yes T ested : bid Blood sugar testing results: Breakfast: 140 max; Bedtime: 220's max Genitourinary Medical History: Negative for: End Stage Renal Disease Past Medical History Notes: Overactive Bladder Immunological Medical History: Negative for: Lupus Erythematosus; Raynauds; Scleroderma Integumentary (Skin) Medical History: Negative for: History of Burn Musculoskeletal Medical History: Positive for: Osteoarthritis Negative for: Gout; Rheumatoid Arthritis; Osteomyelitis Past Medical History  Notes: Fibromyalgia, toe amputations x 3 Neurologic Medical History: Positive for: Neuropathy Negative for: Dementia; Quadriplegia; Paraplegia; Seizure Disorder Oncologic Medical History: Negative for: Received Chemotherapy; Received Radiation Psychiatric Medical History: Negative for: Anorexia/bulimia; Confinement Anxiety Past Medical History Notes: Anxiety HBO Extended History Items Ear/Nose/Mouth/Throat: Eyes: Eyes: Chronic sinus Cataracts Glaucoma problems/congestion Immunizations Pneumococcal Vaccine: Received Pneumococcal Vaccination: Yes Received Pneumococcal Vaccination On or After 60th Birthday: No Immunization Notes: up to date on pneumonia and tetanus shots Implantable Devices None Family and Social  History Cancer: Yes - Child; Diabetes: Yes - Father; Heart Disease: Yes - Mother,Father; Hereditary Spherocytosis: No; Hypertension: Yes - Mother,Father; Kidney Disease: No; Lung Disease: No; Seizures: No; Stroke: No; Thyroid Problems: No; Tuberculosis: No; Never smoker; Marital Status - Married; Alcohol Use: Never; Drug Use: No History; Caffeine Use: Daily - coffee , sodas; Financial Concerns: No; Food, Clothing or Shelter Needs: No; Support System Lacking: No; Transportation Concerns: No Electronic Signature(s) Signed: 11/23/2021 12:26:18 PM By: Kalman Shan DO Signed: 11/24/2021 3:58:04 PM By: Lorrin Jackson Entered By: Kalman Shan on 11/23/2021 12:16:00 -------------------------------------------------------------------------------- SuperBill Details Patient Name: Date of Service: MO Yesenia San, [name] 11/23/2021 Medical Record Number: [id number] Patient Account Number: [id number] Date of Birth/Sex: Treating RN: 04-13-1951 (71 y.o. Yesenia Payne Primary Care Provider: [name] Other Clinician: Referring Provider: Treating Provider/Extender: Alysia Penna in Treatment: 29 Diagnosis Coding ICD-10 Codes Code  Description [phone] 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ity L97.819 Non-pressure chronic ulcer of other part of right lower leg with unspecified severity E11.9 Type 2 diabetes mellitus without complications A00.459 Acquired absence of left leg below knee S31.109D Unspecified open wound of abdominal wall, unspecified quadrant without penetration into peritoneal cavity, subsequent encounter Facility Procedures CPT4 Code: [id number] Description: [phone] - WOUND CARE VISIT-LEV 5 EST PT Modifier: Quantity: 1 Physician Procedures : CPT4 Code Description Modifier [id number] - WC PHYS LEVEL 3 - EST PT ICD-10 Diagnosis Description 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ity L97.819 Non-pressure chronic ulcer of other part of right  lower leg with unspecified severity E11.9 Type 2 diabetes mellitus without complications H72.902X Unspecified open wound of abdominal wall, unspecified quadrant without penetration into peritoneal cav subsequent encounter Quantity: 1 ity, Electronic Signature(s) Signed: 11/23/2021 12:26:18 PM By: Kalman Shan DO Entered By: Kalman Shan on 11/23/2021 12:25:57

## 2021-11-25 DIAGNOSIS — S71101A Unspecified open wound, right thigh, initial encounter: Secondary | ICD-10-CM | POA: Diagnosis not present

## 2021-11-25 DIAGNOSIS — E1142 Type 2 diabetes mellitus with diabetic polyneuropathy: Secondary | ICD-10-CM | POA: Diagnosis not present

## 2021-11-25 DIAGNOSIS — E1169 Type 2 diabetes mellitus with other specified complication: Secondary | ICD-10-CM | POA: Diagnosis not present

## 2021-11-25 DIAGNOSIS — M5030 Other cervical disc degeneration, unspecified cervical region: Secondary | ICD-10-CM | POA: Diagnosis not present

## 2021-11-25 DIAGNOSIS — L97829 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ity: Secondary | ICD-10-CM | POA: Diagnosis not present

## 2021-11-25 DIAGNOSIS — M961 Postlaminectomy syndrome, not elsewhere classified: Secondary | ICD-10-CM | POA: Diagnosis not present

## 2021-11-25 DIAGNOSIS — G894 Chronic pain syndrome: Secondary | ICD-10-CM | POA: Diagnosis not present

## 2021-11-30 DIAGNOSIS — S71101A Unspecified open wound, right thigh, initial encounter: Secondary | ICD-10-CM | POA: Diagnosis not present

## 2021-12-07 DIAGNOSIS — G894 Chronic pain syndrome: Secondary | ICD-10-CM | POA: Diagnosis not present

## 2021-12-07 DIAGNOSIS — I10 Essential (primary) hypertension: Secondary | ICD-10-CM | POA: Diagnosis not present

## 2021-12-07 DIAGNOSIS — L8990 Pressure ulcer of unspecified site, unspecified stage: Secondary | ICD-10-CM | POA: Diagnosis not present

## 2021-12-07 DIAGNOSIS — S71101A Unspecified open wound, right thigh, initial encounter: Secondary | ICD-10-CM | POA: Diagnosis not present

## 2021-12-07 DIAGNOSIS — S71102S Unspecified open wound, left thigh, sequela: Secondary | ICD-10-CM | POA: Diagnosis not present

## 2021-12-07 DIAGNOSIS — L02612 Cutaneous abscess of left foot: Secondary | ICD-10-CM | POA: Diagnosis not present

## 2021-12-07 DIAGNOSIS — E1165 Type 2 diabetes mellitus with hyperglycemia: Secondary | ICD-10-CM | POA: Diagnosis not present

## 2021-12-14 ENCOUNTER — Other Ambulatory Visit: Payer: Self-pay

## 2021-12-14 ENCOUNTER — Encounter (HOSPITAL_BASED_OUTPATIENT_CLINIC_OR_DEPARTMENT_OTHER): Payer: Medicare Other | Attending: Internal Medicine | Admitting: Internal Medicine

## 2021-12-14 DIAGNOSIS — E1142 Type 2 diabetes mellitus with diabetic polyneuropathy: Secondary | ICD-10-CM | POA: Insufficient documentation

## 2021-12-14 DIAGNOSIS — L97112 Non-pressure chronic ulcer of right thigh with fat layer exposed: Secondary | ICD-10-CM | POA: Insufficient documentation

## 2021-12-14 DIAGNOSIS — E119 Type 2 diabetes mellitus without complications: Secondary | ICD-10-CM

## 2021-12-14 DIAGNOSIS — X58XXXA Exposure to other specified factors, initial encounter: Secondary | ICD-10-CM | POA: Insufficient documentation

## 2021-12-14 DIAGNOSIS — L97819 Non-pressure chronic ulcer of other part of right lower leg with unspecified severity: Secondary | ICD-10-CM | POA: Diagnosis not present

## 2021-12-14 DIAGNOSIS — Z89512 Acquired absence of left leg below knee: Secondary | ICD-10-CM | POA: Diagnosis not present

## 2021-12-14 DIAGNOSIS — Z794 Long term (current) use of insulin: Secondary | ICD-10-CM | POA: Diagnosis not present

## 2021-12-14 DIAGNOSIS — L97122 Non-pressure chronic ulcer of left thigh with fat layer exposed: Secondary | ICD-10-CM | POA: Diagnosis not present

## 2021-12-14 DIAGNOSIS — I10 Essential (primary) hypertension: Secondary | ICD-10-CM | POA: Insufficient documentation

## 2021-12-14 DIAGNOSIS — L97829 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ity: Secondary | ICD-10-CM

## 2021-12-14 DIAGNOSIS — S31109D Unspecified open wound of abdominal wall, unspecified quadrant without penetration into peritoneal cavity, subsequent encounter: Secondary | ICD-10-CM | POA: Diagnosis not present

## 2021-12-14 DIAGNOSIS — S31109A Unspecified open wound of abdominal wall, unspecified quadrant without penetration into peritoneal cavity, initial encounter: Secondary | ICD-10-CM | POA: Diagnosis present

## 2021-12-14 NOTE — Progress Notes (Signed)
Yesenia Payne, Yesenia Payne ([id number]) Visit Report for 12/14/2021 Arrival Information Details Patient Name: Date of Service: [name], [name] 12/14/2021 10:45 A M Medical Record Number: [id number] Patient Account Number: [id number] Date of Birth/Sex: Treating RN: [birth year] (71 y.o. [name] Primary Care Danniell Rotundo: [name] Other Clinician: Referring Ewel Lona: Treating Takeysha Bonk/Extender: Alysia Penna in Treatment: 25 Visit Information History Since Last Visit Added or deleted any medications: No Patient Arrived: Stretcher Any new allergies or adverse reactions: No Arrival Time: 10:45 Had a fall or experienced change in No Accompanied By: daughter activities of daily living that may affect Transfer Assistance: Stretcher risk of falls: Patient Identification Verified: Yes Signs or symptoms of abuse/neglect since last visito No Secondary Verification Process Completed: Yes Hospitalized since last visit: No Patient Requires Transmission-Based Precautions: No Implantable device outside of the clinic excluding No Patient Has Alerts: No cellular tissue based products placed in the center since last visit: Has Dressing in Place as Prescribed: Yes Pain Present Now: No Electronic Signature(s) Signed: 12/14/2021 4:43:35 PM By: Lorrin Jackson Entered By: Lorrin Jackson on 12/14/2021 10:45:35 -------------------------------------------------------------------------------- Encounter Discharge Information Details Patient Name: Date of Service: [name], [name] 12/14/2021 10:45 A M Medical Record Number: [id number] Patient Account Number: [id number] Date of Birth/Sex: Treating RN: 20-Nov-1950 (71 y.o. [name] Primary Care Rosalba Totty: [name] Other Clinician: Referring Shanti Eichel: Treating Saraann Enneking/Extender: Alysia Penna in Treatment: 32 Encounter Discharge Information Items Post Procedure Vitals Discharge  Condition: Stable Temperature (F): 99 Ambulatory Status: Stretcher Pulse (bpm): 85 Discharge Destination: Home Respiratory Rate (breaths/min): 18 Transportation: Ambulance Blood Pressure (mmHg): 151/76 Accompanied By: daughter Schedule Follow-up Appointment: Yes Clinical Summary of Care: Provided on 12/14/2021 Form Type Recipient Paper Patient Patient Electronic Signature(s) Signed: 12/14/2021 4:43:35 PM By: Lorrin Jackson Entered By: Lorrin Jackson on 12/14/2021 11:35:35 -------------------------------------------------------------------------------- Lower Extremity Assessment Details Patient Name: Date of Service: [name], [name] 12/14/2021 10:45 A M Medical Record Number: [id number] Patient Account Number: [id number] Date of Birth/Sex: Treating RN: [birth year] (71 y.o. [name] Primary Care Estill Llerena: [name] Other Clinician: Referring Sulo Janczak: Treating Giovannie Scerbo/Extender: Alysia Penna in Treatment: 32 Electronic Signature(s) Signed: 12/14/2021 4:43:35 PM By: Lorrin Jackson Entered By: Lorrin Jackson on 12/14/2021 11:00:55 -------------------------------------------------------------------------------- Multi Wound Chart Details Patient Name: Date of Service: [name], [name] 12/14/2021 10:45 A M Medical Record Number: [id number] Patient Account Number: [id number] Date of Birth/Sex: Treating RN: 10-09-1951 (71 y.o. [name] Primary Care Alyzah Pelly: [name] Other Clinician: Referring Lourdes Manning: Treating Daney Moor/Extender: Alysia Penna in Treatment: 32 Vital Signs Height(in): 37 Pulse(bpm): 59 Weight(lbs): 49 Blood Pressure(mmHg): 151/76 Body Mass Index(BMI): 49.4 Temperature(F): 99 Respiratory Rate(breaths/min): 18 Photos: [10:No Photos Right Abdomen - Lower Quadrant] [11:No Photos Left, Medial Upper Leg] [12:No Photos Left Abdomen - Lower Quadrant] Wound Location: [10:Gradually  Appeared] [11:Gradually Appeared] [12:Gradually Appeared] Wounding Event: [10:Pressure Ulcer] [11:Pressure Ulcer] [12:Pressure Ulcer] Primary Etiology: [10:Cataracts, Glaucoma, Chronic sinus] [11:Cataracts, Glaucoma, Chronic sinus] [12:Cataracts, Glaucoma, Chronic sinus] Comorbid History: [10:problems/congestion, Asthma, Hypertension, Type II Diabetes, Osteoarthritis, Neuropathy 09/14/2021] [11:problems/congestion, Asthma, Hypertension, Type II Diabetes, Osteoarthritis, Neuropathy 09/14/2021] [12:problems/congestion,  Asthma, Hypertension, Type II Diabetes, Osteoarthritis, Neuropathy 10/26/2021] Date Acquired: [10:13] [11:13] [12:7] Weeks of Treatment: [10:Open] [11:Open] [12:Open] Wound Status: [10:No] [11:No] [12:No] Wound Recurrence: [10:0.5x1.5x0.3] [11:1.5x0.7x0.2] [12:3x1.6x0.2] Measurements L x W x D (cm) [10:0.589] [11:0.825] [12:3.77] A (cm) : rea [10:0.177] [11:0.165] [12:0.754] Volume (cm) : [10:-33.90%] [11:-599.20%] [12:-500.30%] %  Reduction in A rea: [10:-101.10%] [11:-587.50%] [12:-140.10%] % Reduction in Volume: [10:Category/Stage III] [11:Category/Stage III] [12:Category/Stage III] Classification: [10:Medium] [11:Medium] [12:Medium] Exudate A mount: [10:Serosanguineous] [11:Serosanguineous] [12:Serosanguineous] Exudate Type: [10:red, brown] [11:red, brown] [12:red, brown] Exudate Color: [10:No] [11:No] [12:No] Foul Odor A Cleansing: [10:fter N/A] [11:N/A] [12:N/A] Odor A nticipated Due to Product Use: [10:Distinct, outline attached] [11:Distinct, outline attached] [12:Distinct, outline attached] Wound Margin: [10:Large (67-100%)] [11:Large (67-100%)] [12:Large (67-100%)] Granulation A mount: [10:Red, Pink] [11:Pink, Pale] [12:Red, Pink] Granulation Quality: [10:Small (1-33%)] [11:Small (1-33%)] [12:Small (1-33%)] Necrotic A mount: [10:Fat Layer (Subcutaneous Tissue): Yes Fat Layer (Subcutaneous Tissue): Yes Fat Layer (Subcutaneous Tissue): Yes] Exposed  Structures: [10:Fascia: No Tendon: No Muscle: No Joint: No Bone: No Small (1-33%)] [11:Fascia: No Tendon: No Muscle: No Joint: No Bone: No None] [12:Fascia: No Tendon: No Muscle: No Joint: No Bone: No None] Epithelialization: [10:N/A] [11:N/A] [12:N/A] Debridement: [10:N/A] [11:N/A] [12:N/A] Instrument: [10:N/A] [11:N/A] [12:N/A] Bleeding: [10:N/A] [11:N/A] [12:N/A] Debridement Treatment Response: [10:N/A] [11:N/A] [12:N/A] Post Debridement Measurements L x W x D (cm) [10:N/A] [11:N/A] [12:N/A] Post Debridement Volume: (cm) [10:N/A] [11:N/A] [12:N/A] Post Debridement Stage: [10:N/A] [11:N/A] [12:N/A] Wound Number: [id number]  Photos: No Photos No Photos No Photos Right Groin Right, Lateral Upper Leg Right Upper Leg Wound Location: Gradually Appeared Pressure Injury Gradually Appeared Wounding Event: Pressure Ulcer Pressure Ulcer Diabetic Wound/Ulcer of the Lower Primary Etiology: Extremity Cataracts, Glaucoma, Chronic sinus Cataracts, Glaucoma, Chronic sinus Cataracts, Glaucoma, Chronic sinus Comorbid History: problems/congestion, Asthma, problems/congestion, Asthma, problems/congestion, Asthma, Hypertension, Type II Diabetes, Hypertension, Type II Diabetes, Hypertension, Type II Diabetes, Osteoarthritis, Neuropathy Osteoarthritis, Neuropathy Osteoarthritis, Neuropathy 11/23/2021 12/07/2021 11/06/2020 Date Acquired: 3 0 32 Weeks of Treatment: Open Open Open Wound Status: No No No Wound Recurrence: 0.8x1x0.1 1x1.2x0.1 1x1.4x1.8 Measurements L x W x D (cm) 0.628 0.942 1.1 A (cm) : rea 0.063 0.094 1.979 Volume (cm) : 85.70% 0.00% 96.10% % Reduction in A rea: 92.80% 0.00% 93.00% % Reduction in Volume: Category/Stage III Category/Stage III Grade 2 Classification: Medium Medium Medium Exudate A mount: Serosanguineous Serosanguineous Serosanguineous Exudate Type: red, brown red, brown red, brown Exudate Color: No No No Foul Odor A Cleansing: fter N/A N/A N/A Odor A  nticipated Due to Product Use: Distinct, outline attached Distinct, outline attached Distinct, outline attached Wound Margin: Large (67-100%) Large (67-100%) Large (67-100%) Granulation A mount: Red Red Pink, Pale, Friable Granulation Quality: None Present (0%) None Present (0%) Small (1-33%) Necrotic A mount: Fat Layer (Subcutaneous Tissue): Yes Fat Layer (Subcutaneous Tissue): Yes Fat Layer (Subcutaneous Tissue): Yes Exposed Structures: Fascia: No Fascia: No Fascia: No Tendon: No Tendon: No Tendon: No Muscle: No Muscle: No Muscle: No Joint: No Joint: No Joint: No Bone: No Bone: No Bone: No Medium (34-66%) None Small (1-33%) Epithelialization: N/A Chemical/Enzymatic/Mechanical N/A Debridement: Pre-procedure Verification/Time Out N/A 11:09 N/A Taken: N/A Other(Gauze) N/A Instrument: N/A None N/A Bleeding: N/A Procedure was tolerated well N/A Debridement Treatment Response: N/A 1x1.2x0.1 N/A Post Debridement Measurements L x W x D (cm) N/A 0.094 N/A Post Debridement Volume: (cm) N/A Category/Stage III N/A Post Debridement Stage: N/A Debridement N/A Procedures Performed: Wound Number: 8 N/A N/A Photos: No Photos N/A N/A Left, Lateral Upper Leg N/A N/A Wound Location: Gradually Appeared N/A N/A Wounding Event: Diabetic Wound/Ulcer of the Lower N/A N/A Primary Etiology: Extremity Cataracts, Glaucoma, Chronic sinus N/A N/A Comorbid History: problems/congestion, Asthma, Hypertension, Type II Diabetes, Osteoarthritis, Neuropathy 01/06/2021 N/A N/A Date Acquired: 84 N/A N/A Weeks of Treatment: Open N/A N/A Wound Status: No N/A N/A Wound Recurrence: 12x6.2x1.7 N/A N/A  Measurements L x W x D (cm) 58.434 N/A N/A A (cm) : rea 99.337 N/A N/A Volume (cm) : 48.30% N/A N/A % Reduction in A rea: 64.90% N/A N/A % Reduction in Volume: Grade 2 N/A N/A Classification: Large N/A N/A Exudate A mount: Serosanguineous N/A N/A Exudate Type: red, brown N/A  N/A Exudate Color: Yes N/A N/A Foul Odor A Cleansing: fter No N/A N/A Odor Anticipated Due to Product Use: Distinct, outline attached N/A N/A Wound Margin: Large (67-100%) N/A N/A Granulation Amount: Red, Friable N/A N/A Granulation Quality: None Present (0%) N/A N/A Necrotic Amount: Fat Layer (Subcutaneous Tissue): Yes N/A N/A Exposed Structures: Fascia: No Tendon: No Muscle: No Joint: No Bone: No Medium (34-66%) N/A N/A Epithelialization: N/A N/A N/A Debridement: N/A N/A N/A Instrument: N/A N/A N/A Bleeding: Debridement Treatment Response: N/A N/A N/A Post Debridement Measurements L x N/A N/A N/A W x D (cm) N/A N/A N/A Post Debridement Volume: (cm) N/A N/A N/A Post Debridement Stage: N/A N/A N/A Procedures Performed: Treatment Notes Wound #10 (Abdomen - Lower Quadrant) Wound Laterality: Right Cleanser Soap and Water Discharge Instruction: May shower and wash wound with dial antibacterial soap and water prior to dressing change. Peri-Wound Care Zinc Oxide Ointment 30g tube Discharge Instruction: As needed Apply Zinc Oxide to periwound with each dressing change Topical Primary Dressing Hydrofera Blue Ready Foam, 2.5 x2.5 in Discharge Instruction: Apply to wound bed as instructed Secondary Dressing Woven Gauze Sponge, Non-Sterile 4x4 in Discharge Instruction: Apply over primary dressing as directed. ABD Pad, 5x9 Discharge Instruction: Apply over primary dressing as directed. Secured With 37M Medipore H Soft Cloth Surgical T 4 x 2 (in/yd) ape Discharge Instruction: Secure dressing with tape as directed. Compression Wrap Compression Stockings Add-Ons Wound #11 (Upper Leg) Wound Laterality: Left, Medial Cleanser Soap and Water Discharge Instruction: May shower and wash wound with dial antibacterial soap and water prior to dressing change. Peri-Wound Care Zinc Oxide Ointment 30g tube Discharge Instruction: As needed Apply Zinc Oxide to periwound with  each dressing change Topical Primary Dressing Hydrofera Blue Ready Foam, 2.5 x2.5 in Discharge Instruction: Apply to wound bed as instructed Secondary Dressing Woven Gauze Sponge, Non-Sterile 4x4 in Discharge Instruction: Apply over primary dressing as directed. ABD Pad, 5x9 Discharge Instruction: Apply over primary dressing as directed. Secured With 37M Medipore H Soft Cloth Surgical T 4 x 2 (in/yd) ape Discharge Instruction: Secure dressing with tape as directed. Compression Wrap Compression Stockings Add-Ons Wound #12 (Abdomen - Lower Quadrant) Wound Laterality: Left Cleanser Soap and Water Discharge Instruction: May shower and wash wound with dial antibacterial soap and water prior to dressing change. Peri-Wound Care Zinc Oxide Ointment 30g tube Discharge Instruction: As needed Apply Zinc Oxide to periwound with each dressing change Topical Primary Dressing Hydrofera Blue Ready Foam, 2.5 x2.5 in Discharge Instruction: Apply to wound bed as instructed Secondary Dressing Woven Gauze Sponge, Non-Sterile 4x4 in Discharge Instruction: Apply over primary dressing as directed. ABD Pad, 5x9 Discharge Instruction: Apply over primary dressing as directed. Secured With 37M Medipore H Soft Cloth Surgical T 4 x 2 (in/yd) ape Discharge Instruction: Secure dressing with tape as directed. Compression Wrap Compression Stockings Add-Ons Wound #13 (Groin) Wound Laterality: Right Cleanser Soap and Water Discharge Instruction: May shower and wash wound with dial antibacterial soap and water prior to dressing change. Peri-Wound Care Zinc Oxide Ointment 30g tube Discharge Instruction: As needed Apply Zinc Oxide to periwound with each dressing change Topical Primary Dressing Hydrofera Blue Ready Foam, 2.5 x2.5 in Discharge Instruction:  Apply to wound bed as instructed Secondary Dressing Woven Gauze Sponge, Non-Sterile 4x4 in Discharge Instruction: Apply over primary dressing as  directed. ABD Pad, 5x9 Discharge Instruction: Apply over primary dressing as directed. Secured With 17M Medipore H Soft Cloth Surgical T 4 x 2 (in/yd) ape Discharge Instruction: Secure dressing with tape as directed. Compression Wrap Compression Stockings Add-Ons Wound #14 (Upper Leg) Wound Laterality: Right, Lateral Cleanser Soap and Water Discharge Instruction: May shower and wash wound with dial antibacterial soap and water prior to dressing change. Peri-Wound Care Zinc Oxide Ointment 30g tube Discharge Instruction: As needed Apply Zinc Oxide to periwound with each dressing change Topical Primary Dressing Hydrofera Blue Ready Foam, 2.5 x2.5 in Discharge Instruction: Apply to wound bed as instructed Secondary Dressing Woven Gauze Sponge, Non-Sterile 4x4 in Discharge Instruction: Apply over primary dressing as directed. ABD Pad, 5x9 Discharge Instruction: Apply over primary dressing as directed. Secured With 17M Medipore H Soft Cloth Surgical T 4 x 2 (in/yd) ape Discharge Instruction: Secure dressing with tape as directed. Compression Wrap Compression Stockings Add-Ons Wound #6 (Upper Leg) Wound Laterality: Right Cleanser Soap and Water Discharge Instruction: May shower and wash wound with dial antibacterial soap and water prior to dressing change. Peri-Wound Care Zinc Oxide Ointment 30g tube Discharge Instruction: As needed Apply Zinc Oxide to periwound with each dressing change Topical Primary Dressing Hydrofera Blue Ready Foam, 2.5 x2.5 in Discharge Instruction: Apply to wound bed as instructed Secondary Dressing Woven Gauze Sponge, Non-Sterile 4x4 in Discharge Instruction: Apply over primary dressing as directed. ABD Pad, 5x9 Discharge Instruction: Apply over primary dressing as directed. Secured With 17M Medipore H Soft Cloth Surgical T 4 x 2 (in/yd) ape Discharge Instruction: Secure dressing with tape as directed. Compression Wrap Compression  Stockings Add-Ons Wound #8 (Upper Leg) Wound Laterality: Left, Lateral Cleanser Soap and Water Discharge Instruction: May shower and wash wound with dial antibacterial soap and water prior to dressing change. Peri-Wound Care Zinc Oxide Ointment 30g tube Discharge Instruction: As needed Apply Zinc Oxide to periwound with each dressing change Topical Primary Dressing Hydrofera Blue Ready Foam, 2.5 x2.5 in Discharge Instruction: Apply to wound bed as instructed Secondary Dressing ABD Pad, 5x9 Discharge Instruction: Apply over primary dressing as directed. Zetuvit Plus 4x8 in Discharge Instruction: Apply over primary dressing as directed. Secured With 17M Medipore H Soft Cloth Surgical T 4 x 2 (in/yd) ape Discharge Instruction: Secure dressing with tape as directed. Compression Wrap Compression Stockings Add-Ons Electronic Signature(s) Signed: 12/14/2021 12:40:27 PM By: Kalman Shan DO Signed: 12/14/2021 4:43:35 PM By: Fara Chute By: Kalman Shan on 12/14/2021 12:31:45 -------------------------------------------------------------------------------- Multi-Disciplinary Care Plan Details Patient Name: Date of Service: [name], [name] 12/14/2021 10:45 A M Medical Record Number: [id number] Patient Account Number: [id number] Date of Birth/Sex: Treating RN: [birth year] (71 y.o. [name] Primary Care Itali Mckendry: [name] Other Clinician: Referring Maythe Deramo: Treating Legaci Tarman/Extender: Alysia Penna in Treatment: 32 Active Inactive Wound/Skin Impairment Nursing Diagnoses: Impaired tissue integrity Knowledge deficit related to ulceration/compromised skin integrity Goals: Patient will demonstrate a reduced rate of smoking or cessation of smoking Date Initiated: 04/30/2021 Target Resolution Date: 01/18/2022 Goal Status: Active Patient/caregiver will verbalize understanding of skin care regimen Date Initiated:  04/30/2021 Target Resolution Date: 01/18/2022 Goal Status: Active Ulcer/skin breakdown will have a volume reduction of 50% by week 8 Date Initiated: 07/09/2021 Date Inactivated: 08/31/2021 Target Resolution Date: 08/31/2021 Goal Status: Met Interventions: Assess patient/caregiver ability to obtain necessary supplies Assess patient/caregiver ability to  perform ulcer/skin care regimen upon admission and as needed Assess ulceration(s) every visit Notes: Electronic Signature(s) Signed: 12/14/2021 4:43:35 PM By: Lorrin Jackson Entered By: Lorrin Jackson on 12/14/2021 10:44:36 -------------------------------------------------------------------------------- Pain Assessment Details Patient Name: Date of Service: [name], [name] 12/14/2021 10:45 A M Medical Record Number: [id number] Patient Account Number: [id number] Date of Birth/Sex: Treating RN: February 08, 1951 (71 y.o. [name] Primary Care Matyas Baisley: [name] Other Clinician: Referring Liesel Peckenpaugh: Treating Vidit Boissonneault/Extender: Alysia Penna in Treatment: 32 Active Problems Location of Pain Severity and Description of Pain Patient Has Paino No Site Locations Pain Management and Medication Current Pain Management: Electronic Signature(s) Signed: 12/14/2021 4:43:35 PM By: Lorrin Jackson Entered By: Lorrin Jackson on 12/14/2021 10:46:35 -------------------------------------------------------------------------------- Patient/Caregiver Education Details Patient Name: Date of Service: [name], [name] 2/6/2023andnbsp10:45 A M Medical Record Number: [id number] Patient Account Number: [id number] Date of Birth/Gender: Treating RN: 04-Mar-1951 (71 y.o. [name] Primary Care Physician: [name] Other Clinician: Referring Physician: Treating Physician/Extender: Alysia Penna in Treatment: 32 Education Assessment Education Provided To: Patient Education Topics  Provided Pressure: Methods: Explain/Verbal, Printed Responses: State content correctly Wound/Skin Impairment: Methods: Demonstration, Explain/Verbal, Printed Responses: State content correctly Electronic Signature(s) Signed: 12/14/2021 4:43:35 PM By: Lorrin Jackson Entered By: Lorrin Jackson on 12/14/2021 10:45:02 -------------------------------------------------------------------------------- Wound Assessment Details Patient Name: Date of Service: [name], [name] 12/14/2021 10:45 A M Medical Record Number: [id number] Patient Account Number: [id number] Date of Birth/Sex: Treating RN: [birth year] (71 y.o. [name] Primary Care Libby Goehring: [name] Other Clinician: Referring Tracker Mance: Treating Tyleigh Mahn/Extender: Alysia Penna in Treatment: 32 Wound Status Wound Number: 10 Primary Pressure Ulcer Etiology: Wound Location: Right Abdomen - Lower Quadrant Wound Open Wounding Event: Gradually Appeared Status: Date Acquired: 09/14/2021 Comorbid Cataracts, Glaucoma, Chronic sinus problems/congestion, Asthma, Weeks Of Treatment: 13 History: Hypertension, Type II Diabetes, Osteoarthritis, Neuropathy Clustered Wound: No Photos Photo Uploaded By: Donavan Burnet on 12/14/2021 16:40:28 Wound Measurements Length: (cm) 0.5 Width: (cm) 1.5 Depth: (cm) 0.3 Area: (cm) 0.589 Volume: (cm) 0.177 % Reduction in Area: -33.9% % Reduction in Volume: -101.1% Epithelialization: Small (1-33%) Tunneling: No Undermining: No Wound Description Classification: Category/Stage III Wound Margin: Distinct, outline attached Exudate Amount: Medium Exudate Type: Serosanguineous Exudate Color: red, brown Foul Odor After Cleansing: No Slough/Fibrino Yes Wound Bed Granulation Amount: Large (67-100%) Exposed Structure Granulation Quality: Red, Pink Fascia Exposed: No Necrotic Amount: Small (1-33%) Fat Layer (Subcutaneous Tissue) Exposed: Yes Necrotic  Quality: Adherent Slough Tendon Exposed: No Muscle Exposed: No Joint Exposed: No Bone Exposed: No Treatment Notes Wound #10 (Abdomen - Lower Quadrant) Wound Laterality: Right Cleanser Soap and Water Discharge Instruction: May shower and wash wound with dial antibacterial soap and water prior to dressing change. Peri-Wound Care Zinc Oxide Ointment 30g tube Discharge Instruction: As needed Apply Zinc Oxide to periwound with each dressing change Topical Primary Dressing Hydrofera Blue Ready Foam, 2.5 x2.5 in Discharge Instruction: Apply to wound bed as instructed Secondary Dressing Woven Gauze Sponge, Non-Sterile 4x4 in Discharge Instruction: Apply over primary dressing as directed. ABD Pad, 5x9 Discharge Instruction: Apply over primary dressing as directed. Secured With 24M Medipore H Soft Cloth Surgical T 4 x 2 (in/yd) ape Discharge Instruction: Secure dressing with tape as directed. Compression Wrap Compression Stockings Add-Ons Electronic Signature(s) Signed: 12/14/2021 4:43:35 PM By: Lorrin Jackson Entered By: Lorrin Jackson on 12/14/2021 10:52:23 -------------------------------------------------------------------------------- Wound Assessment Details Patient Name: Date of Service: MO RTO N, [place] RGA RET  D. 12/14/2021 10:45 A M Medical Record Number: [id number] Patient Account Number: [id number] Date of Birth/Sex: Treating RN: [birth year] (71 y.o. [name] Primary Care Jona Erkkila: [name] Other Clinician: Referring Yassmin Binegar: Treating Stefan Karen/Extender: Alysia Penna in Treatment: 32 Wound Status Wound Number: 11 Primary Pressure Ulcer Etiology: Wound Location: Left, Medial Upper Leg Wound Open Wounding Event: Gradually Appeared Status: Date Acquired: 09/14/2021 Comorbid Cataracts, Glaucoma, Chronic sinus problems/congestion, Asthma, Weeks Of Treatment: 13 History: Hypertension, Type II Diabetes, Osteoarthritis,  Neuropathy Clustered Wound: No Photos Photo Uploaded By: Donavan Burnet on 12/14/2021 16:38:21 Wound Measurements Length: (cm) 1.5 Width: (cm) 0.7 Depth: (cm) 0.2 Area: (cm) 0.825 Volume: (cm) 0.165 % Reduction in Area: -599.2% % Reduction in Volume: -587.5% Epithelialization: None Tunneling: No Undermining: No Wound Description Classification: Category/Stage III Wound Margin: Distinct, outline attached Exudate Amount: Medium Exudate Type: Serosanguineous Exudate Color: red, brown Foul Odor After Cleansing: No Slough/Fibrino Yes Wound Bed Granulation Amount: Large (67-100%) Exposed Structure Granulation Quality: Pink, Pale Fascia Exposed: No Necrotic Amount: Small (1-33%) Fat Layer (Subcutaneous Tissue) Exposed: Yes Necrotic Quality: Adherent Slough Tendon Exposed: No Muscle Exposed: No Joint Exposed: No Bone Exposed: No Treatment Notes Wound #11 (Upper Leg) Wound Laterality: Left, Medial Cleanser Soap and Water Discharge Instruction: May shower and wash wound with dial antibacterial soap and water prior to dressing change. Peri-Wound Care Zinc Oxide Ointment 30g tube Discharge Instruction: As needed Apply Zinc Oxide to periwound with each dressing change Topical Primary Dressing Hydrofera Blue Ready Foam, 2.5 x2.5 in Discharge Instruction: Apply to wound bed as instructed Secondary Dressing Woven Gauze Sponge, Non-Sterile 4x4 in Discharge Instruction: Apply over primary dressing as directed. ABD Pad, 5x9 Discharge Instruction: Apply over primary dressing as directed. Secured With 39M Medipore H Soft Cloth Surgical T 4 x 2 (in/yd) ape Discharge Instruction: Secure dressing with tape as directed. Compression Wrap Compression Stockings Add-Ons Electronic Signature(s) Signed: 12/14/2021 4:43:35 PM By: Lorrin Jackson Entered By: Lorrin Jackson on 12/14/2021 10:49:57 -------------------------------------------------------------------------------- Wound  Assessment Details Patient Name: Date of Service: [name], [name] 12/14/2021 10:45 A M Medical Record Number: [id number] Patient Account Number: [id number] Date of Birth/Sex: Treating RN: 03-14-1951 (71 y.o. [name] Primary Care Karilyn Wind: [name] Other Clinician: Referring Datra Clary: Treating Tashera Montalvo/Extender: Alysia Penna in Treatment: 32 Wound Status Wound Number: 12 Primary Pressure Ulcer Etiology: Wound Location: Left Abdomen - Lower Quadrant Wound Open Wounding Event: Gradually Appeared Status: Date Acquired: 10/26/2021 Comorbid Cataracts, Glaucoma, Chronic sinus problems/congestion, Asthma, Weeks Of Treatment: 7 History: Hypertension, Type II Diabetes, Osteoarthritis, Neuropathy Clustered Wound: No Photos Photo Uploaded By: Donavan Burnet on 12/14/2021 16:38:21 Wound Measurements Length: (cm) 3 Width: (cm) 1.6 Depth: (cm) 0.2 Area: (cm) 3.77 Volume: (cm) 0.754 % Reduction in Area: -500.3% % Reduction in Volume: -140.1% Epithelialization: None Tunneling: No Undermining: No Wound Description Classification: Category/Stage III Wound Margin: Distinct, outline attached Exudate Amount: Medium Exudate Type: Serosanguineous Exudate Color: red, brown Foul Odor After Cleansing: No Wound Bed Granulation Amount: Large (67-100%) Exposed Structure Granulation Quality: Red, Pink Fascia Exposed: No Necrotic Amount: Small (1-33%) Fat Layer (Subcutaneous Tissue) Exposed: Yes Necrotic Quality: Adherent Slough Tendon Exposed: No Muscle Exposed: No Joint Exposed: No Bone Exposed: No Treatment Notes Wound #12 (Abdomen - Lower Quadrant) Wound Laterality: Left Cleanser Soap and Water Discharge Instruction: May shower and wash wound with dial antibacterial soap and water prior to dressing change. Peri-Wound Care Zinc Oxide Ointment 30g tube Discharge Instruction: As needed Apply Zinc Oxide to  periwound with each dressing  change Topical Primary Dressing Hydrofera Blue Ready Foam, 2.5 x2.5 in Discharge Instruction: Apply to wound bed as instructed Secondary Dressing Woven Gauze Sponge, Non-Sterile 4x4 in Discharge Instruction: Apply over primary dressing as directed. ABD Pad, 5x9 Discharge Instruction: Apply over primary dressing as directed. Secured With 74M Medipore H Soft Cloth Surgical T 4 x 2 (in/yd) ape Discharge Instruction: Secure dressing with tape as directed. Compression Wrap Compression Stockings Add-Ons Electronic Signature(s) Signed: 12/14/2021 4:43:35 PM By: Lorrin Jackson Entered By: Lorrin Jackson on 12/14/2021 10:49:15 -------------------------------------------------------------------------------- Wound Assessment Details Patient Name: Date of Service: [name], [name] 12/14/2021 10:45 A M Medical Record Number: [id number] Patient Account Number: [id number] Date of Birth/Sex: Treating RN: 1951/02/20 (71 y.o. [name] Primary Care Darcell Sabino: [name] Other Clinician: Referring Advith Martine: Treating Wheeler Incorvaia/Extender: Alysia Penna in Treatment: 32 Wound Status Wound Number: 13 Primary Pressure Ulcer Etiology: Wound Location: Right Groin Wound Open Wounding Event: Gradually Appeared Status: Date Acquired: 11/23/2021 Comorbid Cataracts, Glaucoma, Chronic sinus problems/congestion, Asthma, Weeks Of Treatment: 3 History: Hypertension, Type II Diabetes, Osteoarthritis, Neuropathy Clustered Wound: No Photos Photo Uploaded By: Donavan Burnet on 12/14/2021 16:39:08 Wound Measurements Length: (cm) 0.8 Width: (cm) 1 Depth: (cm) 0.1 Area: (cm) 0.628 Volume: (cm) 0.063 % Reduction in Area: 85.7% % Reduction in Volume: 92.8% Epithelialization: Medium (34-66%) Tunneling: No Undermining: No Wound Description Classification: Category/Stage III Wound Margin: Distinct, outline attached Exudate Amount: Medium Exudate Type:  Serosanguineous Exudate Color: red, brown Foul Odor After Cleansing: No Slough/Fibrino No Wound Bed Granulation Amount: Large (67-100%) Exposed Structure Granulation Quality: Red Fascia Exposed: No Necrotic Amount: None Present (0%) Fat Layer (Subcutaneous Tissue) Exposed: Yes Tendon Exposed: No Muscle Exposed: No Joint Exposed: No Bone Exposed: No Treatment Notes Wound #13 (Groin) Wound Laterality: Right Cleanser Soap and Water Discharge Instruction: May shower and wash wound with dial antibacterial soap and water prior to dressing change. Peri-Wound Care Zinc Oxide Ointment 30g tube Discharge Instruction: As needed Apply Zinc Oxide to periwound with each dressing change Topical Primary Dressing Hydrofera Blue Ready Foam, 2.5 x2.5 in Discharge Instruction: Apply to wound bed as instructed Secondary Dressing Woven Gauze Sponge, Non-Sterile 4x4 in Discharge Instruction: Apply over primary dressing as directed. ABD Pad, 5x9 Discharge Instruction: Apply over primary dressing as directed. Secured With 74M Medipore H Soft Cloth Surgical T 4 x 2 (in/yd) ape Discharge Instruction: Secure dressing with tape as directed. Compression Wrap Compression Stockings Add-Ons Electronic Signature(s) Signed: 12/14/2021 4:43:35 PM By: Lorrin Jackson Entered By: Lorrin Jackson on 12/14/2021 10:54:19 -------------------------------------------------------------------------------- Wound Assessment Details Patient Name: Date of Service: [name], [name] 12/14/2021 10:45 A M Medical Record Number: [id number] Patient Account Number: [id number] Date of Birth/Sex: Treating RN: 1951/09/30 (71 y.o. [name] Primary Care Danell Vazquez: [name] Other Clinician: Referring Manon Banbury: Treating Albirta Rhinehart/Extender: Alysia Penna in Treatment: 32 Wound Status Wound Number: 14 Primary Pressure Ulcer Etiology: Wound Location: Right, Lateral Upper Leg Wound  Open Wounding Event: Pressure Injury Status: Status: Date Acquired: 12/07/2021 Comorbid Cataracts, Glaucoma, Chronic sinus problems/congestion, Asthma, Weeks Of Treatment: 0 History: Hypertension, Type II Diabetes, Osteoarthritis, Neuropathy Clustered Wound: No Photos Photo Uploaded By: Donavan Burnet on 12/14/2021 16:39:08 Wound Measurements Length: (cm) 1 Width: (cm) 1.2 Depth: (cm) 0.1 Area: (cm) 0.942 Volume: (cm) 0.094 % Reduction in Area: 0% % Reduction in Volume: 0% Epithelialization: None Tunneling: No Undermining: No Wound Description Classification: Category/Stage III Wound Margin: Distinct, outline attached Exudate  Amount: Medium Exudate Type: Serosanguineous Exudate Color: red, brown Foul Odor After Cleansing: No Slough/Fibrino No Wound Bed Granulation Amount: Large (67-100%) Exposed Structure Granulation Quality: Red Fascia Exposed: No Necrotic Amount: None Present (0%) Fat Layer (Subcutaneous Tissue) Exposed: Yes Tendon Exposed: No Muscle Exposed: No Joint Exposed: No Bone Exposed: No Treatment Notes Wound #14 (Upper Leg) Wound Laterality: Right, Lateral Cleanser Soap and Water Discharge Instruction: May shower and wash wound with dial antibacterial soap and water prior to dressing change. Peri-Wound Care Zinc Oxide Ointment 30g tube Discharge Instruction: As needed Apply Zinc Oxide to periwound with each dressing change Topical Primary Dressing Hydrofera Blue Ready Foam, 2.5 x2.5 in Discharge Instruction: Apply to wound bed as instructed Secondary Dressing Woven Gauze Sponge, Non-Sterile 4x4 in Discharge Instruction: Apply over primary dressing as directed. ABD Pad, 5x9 Discharge Instruction: Apply over primary dressing as directed. Secured With 29M Medipore H Soft Cloth Surgical T 4 x 2 (in/yd) ape Discharge Instruction: Secure dressing with tape as directed. Compression Wrap Compression Stockings Add-Ons Electronic  Signature(s) Signed: 12/14/2021 4:43:35 PM By: Lorrin Jackson Entered By: Lorrin Jackson on 12/14/2021 10:59:04 -------------------------------------------------------------------------------- Wound Assessment Details Patient Name: Date of Service: [name], [name] 12/14/2021 10:45 A M Medical Record Number: [id number] Patient Account Number: [id number] Date of Birth/Sex: Treating RN: 1950/12/26 (71 y.o. [name] Primary Care Ronal Maybury: [name] Other Clinician: Referring Khanh Cordner: Treating Lavontae Cornia/Extender: Alysia Penna in Treatment: 32 Wound Status Wound Number: 6 Primary Diabetic Wound/Ulcer of the Lower Extremity Etiology: Wound Location: Right Upper Leg Wound Open Wounding Event: Gradually Appeared Status: Date Acquired: 11/06/2020 Comorbid Cataracts, Glaucoma, Chronic sinus problems/congestion, Asthma, Weeks Of Treatment: 32 History: Hypertension, Type II Diabetes, Osteoarthritis, Neuropathy Clustered Wound: No Photos Photo Uploaded By: Donavan Burnet on 12/14/2021 16:39:52 Wound Measurements Length: (cm) 1 Width: (cm) 1.4 Depth: (cm) 1.8 Area: (cm) 1.1 Volume: (cm) 1.979 % Reduction in Area: 96.1% % Reduction in Volume: 93% Epithelialization: Small (1-33%) Tunneling: No Undermining: No Wound Description Classification: Grade 2 Wound Margin: Distinct, outline attached Exudate Amount: Medium Exudate Type: Serosanguineous Exudate Color: red, brown Foul Odor After Cleansing: No Slough/Fibrino Yes Wound Bed Granulation Amount: Large (67-100%) Exposed Structure Granulation Quality: Pink, Pale, Friable Fascia Exposed: No Necrotic Amount: Small (1-33%) Fat Layer (Subcutaneous Tissue) Exposed: Yes Necrotic Quality: Adherent Slough Tendon Exposed: No Muscle Exposed: No Joint Exposed: No Bone Exposed: No Treatment Notes Wound #6 (Upper Leg) Wound Laterality: Right Cleanser Soap and Water Discharge Instruction:  May shower and wash wound with dial antibacterial soap and water prior to dressing change. Peri-Wound Care Zinc Oxide Ointment 30g tube Discharge Instruction: As needed Apply Zinc Oxide to periwound with each dressing change Topical Primary Dressing Hydrofera Blue Ready Foam, 2.5 x2.5 in Discharge Instruction: Apply to wound bed as instructed Secondary Dressing Woven Gauze Sponge, Non-Sterile 4x4 in Discharge Instruction: Apply over primary dressing as directed. ABD Pad, 5x9 Discharge Instruction: Apply over primary dressing as directed. Secured With 29M Medipore H Soft Cloth Surgical T 4 x 2 (in/yd) ape Discharge Instruction: Secure dressing with tape as directed. Compression Wrap Compression Stockings Add-Ons Electronic Signature(s) Signed: 12/14/2021 4:43:35 PM By: Lorrin Jackson Entered By: Lorrin Jackson on 12/14/2021 10:59:41 -------------------------------------------------------------------------------- Wound Assessment Details Patient Name: Date of Service: [name], [name] 12/14/2021 10:45 A M Medical Record Number: [id number] Patient Account Number: [id number] Date of Birth/Sex: Treating RN: Sep 03, 1951 (71 y.o. [name] Primary Care Lekeith Wulf: [name] Other Clinician: Referring Jaccob Czaplicki: Treating  Thadeus Gandolfi/Extender: Alysia Penna in Treatment: 32 Wound Status Wound Number: 8 Primary Diabetic Wound/Ulcer of the Lower Extremity Etiology: Wound Location: Left, Lateral Upper Leg Wound Open Wounding Event: Gradually Appeared Status: Date Acquired: 01/06/2021 Comorbid Cataracts, Glaucoma, Chronic sinus problems/congestion, Asthma, Weeks Of Treatment: 32 History: Hypertension, Type II Diabetes, Osteoarthritis, Neuropathy Clustered Wound: No Photos Photo Uploaded By: Donavan Burnet on 12/14/2021 16:39:52 Wound Measurements Length: (cm) 12 Width: (cm) 6.2 Depth: (cm) 1.7 Area: (cm) 58.434 Volume: (cm) 99.337 %  Reduction in Area: 48.3% % Reduction in Volume: 64.9% Epithelialization: Medium (34-66%) Tunneling: No Undermining: No Wound Description Classification: Grade 2 Wound Margin: Distinct, outline attached Exudate Amount: Large Exudate Type: Serosanguineous Exudate Color: red, brown Foul Odor After Cleansing: Yes Due to Product Use: No Slough/Fibrino No Wound Bed Granulation Amount: Large (67-100%) Exposed Structure Granulation Quality: Red, Friable Fascia Exposed: No Necrotic Amount: None Present (0%) Fat Layer (Subcutaneous Tissue) Exposed: Yes Tendon Exposed: No Muscle Exposed: No Joint Exposed: No Bone Exposed: No Treatment Notes Wound #8 (Upper Leg) Wound Laterality: Left, Lateral Cleanser Soap and Water Discharge Instruction: May shower and wash wound with dial antibacterial soap and water prior to dressing change. Peri-Wound Care Zinc Oxide Ointment 30g tube Discharge Instruction: As needed Apply Zinc Oxide to periwound with each dressing change Topical Primary Dressing Hydrofera Blue Ready Foam, 2.5 x2.5 in Discharge Instruction: Apply to wound bed as instructed Secondary Dressing ABD Pad, 5x9 Discharge Instruction: Apply over primary dressing as directed. Zetuvit Plus 4x8 in Discharge Instruction: Apply over primary dressing as directed. Secured With 5M Medipore H Soft Cloth Surgical T 4 x 2 (in/yd) ape Discharge Instruction: Secure dressing with tape as directed. Compression Wrap Compression Stockings Add-Ons Electronic Signature(s) Signed: 12/14/2021 4:43:35 PM By: Lorrin Jackson Entered By: Lorrin Jackson on 12/14/2021 11:04:37 -------------------------------------------------------------------------------- Vitals Details Patient Name: Date of Service: [name], [name] 12/14/2021 10:45 A M Medical Record Number: [id number] Patient Account Number: [id number] Date of Birth/Sex: Treating RN: [birth year] (71 y.o. [name] Primary Care Smitty Ackerley:  [name] Other Clinician: Referring Mavi Un: Treating Brandonlee Navis/Extender: Alysia Penna in Treatment: 32 Vital Signs Time Taken: 10:46 Temperature (F): 99 Height (in): 63 Pulse (bpm): 85 Weight (lbs): 279 Respiratory Rate (breaths/min): 18 Body Mass Index (BMI): 49.4 Blood Pressure (mmHg): 151/76 Reference Range: 80 - 120 mg / dl Electronic Signature(s) Signed: 12/14/2021 4:43:35 PM By: Lorrin Jackson Entered By: Lorrin Jackson on 12/14/2021 11:31:41

## 2021-12-14 NOTE — Progress Notes (Signed)
Yesenia Payne, EK ([id number]) Visit Report for 12/14/2021 Chief Complaint Document Details Patient Name: Date of Service: [name], [name] 12/14/2021 10:45 A M Medical Record Number: [id number] Patient Account Number: [id number] Date of Birth/Sex: Treating RN: 04-07-1951 (71 y.o. [name] Primary Care Provider: [name] Other Clinician: Referring Provider: Treating Provider/Extender: Alysia Penna in Treatment: 32 Information Obtained from: Patient Chief Complaint Left lower extremity wounds and right upper leg wound, Pannus wounds Electronic Signature(s) Signed: 12/14/2021 12:40:27 PM By: Kalman Shan DO Entered By: Kalman Shan on 12/14/2021 12:33:37 -------------------------------------------------------------------------------- Debridement Details Patient Name: Date of Service: [name], [name] 12/14/2021 10:45 A M Medical Record Number: [id number] Patient Account Number: [id number] Date of Birth/Sex: Treating RN: 1951/09/30 (71 y.o. [name] Primary Care Provider: [name] Other Clinician: Referring Provider: Treating Provider/Extender: Alysia Penna in Treatment: 32 Debridement Performed for Assessment: Wound #14 Right,Lateral Upper Leg Performed By: Physician Kalman Shan, DO Debridement Type: Chemical/Enzymatic/Mechanical Agent Used: gauze and wound cleanser Level of Consciousness (Pre-procedure): Awake and Alert Pre-procedure Verification/Time Out Yes - 11:09 Taken: Start Time: 11:10 Instrument: Other : Gauze Bleeding: None End Time: 11:17 Response to Treatment: Procedure was tolerated well Level of Consciousness (Post- Awake and Alert procedure): Post Debridement Measurements of Total Wound Length: (cm) 1 Stage: Category/Stage III Width: (cm) 1.2 Depth: (cm) 0.1 Volume: (cm) 0.094 Character of Wound/Ulcer Post Debridement: Stable Post Procedure Diagnosis Same  as Pre-procedure Electronic Signature(s) Signed: 12/14/2021 12:40:27 PM By: Kalman Shan DO Signed: 12/14/2021 4:43:35 PM By: Lorrin Jackson Signed: 12/14/2021 4:43:35 PM By: Lorrin Jackson Entered By: Lorrin Jackson on 12/14/2021 11:17:50 -------------------------------------------------------------------------------- HPI Details Patient Name: Date of Service: [name], [name] 12/14/2021 10:45 A M Medical Record Number: [id number] Patient Account Number: [id number] Date of Birth/Sex: Treating RN: Nov 06, 1951 (71 y.o. [name] Primary Care Provider: [name] Other Clinician: Referring Provider: Treating Provider/Extender: Alysia Penna in Treatment: 32 History of Present Illness HPI Description: Admission 04/30/2021 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 She has been seen in our clinic multiple times last seen in 03/2019 for right toe wounds. Today she presents for 3 wounds. 1 is located to the right upper leg and 2 to the left lower extremity. They started at the end of December And not sure how I started. She had not been using any dressings up until 2 weeks ago. She is currently using silver alginate to the right upper leg and Santyl to the wounds on the left leg. She has been on doxycycline and 2 rounds of clindamycin for cellulitis to these wounds. She finished her last round of antibiotics 3 weeks ago. She reports minimal pain to the wound. She currently denies signs of infection including increased erythema, warmth or purulent drainage. 7/5; patient presents for 1 week follow-up. She has been using Santyl to the left lower extremity wounds and silver alginate to the right upper leg wound. She denies signs of infection. She has no complaints today. 7/19; patient presents for 1 week follow-up. She has  been using Santyl to the left lower extremity wounds and silver alginate to the right upper leg wound. She denies signs of infection. She has had to use a lot of Santyl to the lateral wound since the necrotic tissue has been breaking down and the wound  is deeper. 8/4; patient presents for 2-week follow-up. She has been using Santyl to the left lower extremity wound and silver alginate to the right upper leg wound. She has been using the wound VAC to the left lateral wound. She has no issues or complaints today. She denies signs of infection. 8/18; patient presents for 2-week follow-up. She has been using Santyl and silver alginate to the right upper wound. She reports that this is deeper today. She has been using the wound VAC to the left lateral wound. She has no issues or complaints today. She denies signs of infection. She reports that the left lower extremity medial wound is healed 9/1; patient presents for 2-week follow-up. She continues to use silver alginate to the right upper wound. She is using the wound VAC to the left lateral wound. She has no issues today. She denies signs of infection. She overall feels well. 10/10; patient presents for follow-up. Patient has not followed up in a month. She states that her husband was recently in the ICU but is now at home. She reports an increase in the wound size to the right lower extremity. She reports improvement to the left lower extremity wound. She currently denies signs of infection. She states she overall feels well. 10/24; patient presents for follow-up. She has a new wound to the right upper thigh. She has been keeping the area covered until yesterday when she started silver alginate. She continues to use Dakin wet-to-dry dressings to the other right upper leg wound. She continues to use the [facility] to the left lower extremity wound. No obvious signs of infection on exam. 11/7; patient presents for follow-up. She again has new wounds to her pannus  folds. She is keeping the area dry with absorbent pads and using silver alginate to the wound beds. She continues to use the wound VAC to the left lower extremity wound without issues. She denies signs of infection. 11/21; patient presents for follow-up. She continues to have new wounds to her pannus folds. Her daughter helps with dressing changes and is keeping the areas dry with absorbent pads and using silver alginate to the wound beds. She continues to use the wound VAC to the left lower extremity wound without issues. She currently denies signs of infection. 12/19; patient presents for follow-up. She continues to have new wounds to her pannus folds. Her daughter helps with the dressings and has been using Hibiclens and silver alginate to the wound beds. Patient continues to use the wound VAC to the left lower extremity wound. She has no issues or complaints today. She denies signs of infection. 1/16; patient presents for follow-up. She again has developed a new wound to the groin. She continues to use Hibiclens and silver alginate to the pannus/groin wounds. She continues to use the wound VAC to the left lower extremity without issues. She denies signs of infection. 2/6; patient presents for follow-up. She can no longer apply white foam under the VAC due to the tissue growing in and filling the tunnels. She continues to use Hibiclens and silver alginate to the pannus/groin wounds. Electronic Signature(s) Signed: 12/14/2021 12:40:27 PM By: Kalman Shan DO Entered By: Kalman Shan on 12/14/2021 12:36:03 -------------------------------------------------------------------------------- Physical Exam Details Patient Name: Date of Service: [name], [name] 12/14/2021 10:45 A M Medical Record Number: [id number] Patient Account Number: [id number] Date of Birth/Sex: Treating RN: 1951/04/14 (71 y.o. [name] Primary Care Provider: [name] Other Clinician: Referring  Provider: Treating Provider/Extender: Heber Carrizozo,  Katrine Coho, Jerre Simon in Treatment: 32 Constitutional respirations regular, non-labored and within target range for patient.Marland Kitchen Psychiatric pleasant and cooperative. Notes Left lower extremity: Large open wound with granulation tissue and 2 areas with minimal increase in depth. No tunneling noted. Overall appears well-healing. Several wounds to the pannus folds and thigh folds all with granulation tissue. No signs of surrounding infection. Electronic Signature(s) Signed: 12/14/2021 12:40:27 PM By: Kalman Shan DO Entered By: Kalman Shan on 12/14/2021 12:37:04 -------------------------------------------------------------------------------- Physician Orders Details Patient Name: Date of Service: [name], [name] 12/14/2021 10:45 A M Medical Record Number: [id number] Patient Account Number: [id number] Date of Birth/Sex: Treating RN: December 25, 1950 (71 y.o. [name] Primary Care Provider: [name] Other Clinician: Referring Provider: Treating Provider/Extender: Alysia Penna in Treatment: 54 Verbal / Phone Orders: No Diagnosis Coding ICD-10 Coding Code Description [id number] 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ity L97.819 Non-pressure chronic ulcer of other part of right lower leg with unspecified severity E11.9 Type 2 diabetes mellitus without complications B04.888 Acquired absence of left leg below knee S31.109D Unspecified open wound of abdominal wall, unspecified quadrant without penetration into peritoneal cavity, subsequent encounter Follow-up Appointments ppointment in 2 weeks. - Dr. Heber [place] - ***STRETCHER - 60 minutes extra time*** Return A Bathing/ Shower/ Hygiene Other Bathing/Shower/Hygiene Orders/Instructions: - You may sponge bathe use hibiclens wash with dressing changes with abdominal skin folds. Off-Loading Turn and reposition  every 2 hours Wound Treatment Wound #10 - Abdomen - Lower Quadrant Wound Laterality: Right Cleanser: Soap and Water Every Other Day/30 Days Discharge Instructions: May shower and wash wound with dial antibacterial soap and water prior to dressing change. Peri-Wound Care: Zinc Oxide Ointment 30g tube Every Other Day/30 Days Discharge Instructions: As needed Apply Zinc Oxide to periwound with each dressing change Prim Dressing: Hydrofera Blue Ready Foam, 2.5 x2.5 in (DME) (Generic) Every Other Day/30 Days ary Discharge Instructions: Apply to wound bed as instructed Secondary Dressing: Woven Gauze Sponge, Non-Sterile 4x4 in (Generic) Every Other Day/30 Days Discharge Instructions: Apply over primary dressing as directed. Secondary Dressing: ABD Pad, 5x9 (DME) (Generic) Every Other Day/30 Days Discharge Instructions: Apply over primary dressing as directed. Secured With: 27M Medipore H Soft Cloth Surgical T 4 x 2 (in/yd) (Generic) Every Other Day/30 Days ape Discharge Instructions: Secure dressing with tape as directed. Wound #11 - Upper Leg Wound Laterality: Left, Medial Cleanser: Soap and Water Every Other Day/30 Days Discharge Instructions: May shower and wash wound with dial antibacterial soap and water prior to dressing change. Peri-Wound Care: Zinc Oxide Ointment 30g tube Every Other Day/30 Days Discharge Instructions: As needed Apply Zinc Oxide to periwound with each dressing change Prim Dressing: Hydrofera Blue Ready Foam, 2.5 x2.5 in (DME) (Generic) Every Other Day/30 Days ary Discharge Instructions: Apply to wound bed as instructed Secondary Dressing: Woven Gauze Sponge, Non-Sterile 4x4 in (Generic) Every Other Day/30 Days Discharge Instructions: Apply over primary dressing as directed. Secondary Dressing: ABD Pad, 5x9 (DME) (Generic) Every Other Day/30 Days Discharge Instructions: Apply over primary dressing as directed. Secured With: 27M Medipore H Soft Cloth Surgical T 4 x 2  (in/yd) (Generic) Every Other Day/30 Days ape Discharge Instructions: Secure dressing with tape as directed. Wound #12 - Abdomen - Lower Quadrant Wound Laterality: Left Cleanser: Soap and Water Every Other Day/30 Days Discharge Instructions: May shower and wash wound with dial antibacterial soap and water prior to dressing change. Peri-Wound Care: Zinc Oxide Ointment 30g tube Every  Other Day/30 Days Discharge Instructions: As needed Apply Zinc Oxide to periwound with each dressing change Prim Dressing: Hydrofera Blue Ready Foam, 2.5 x2.5 in (DME) (Generic) Every Other Day/30 Days ary Discharge Instructions: Apply to wound bed as instructed Secondary Dressing: Woven Gauze Sponge, Non-Sterile 4x4 in (Generic) Every Other Day/30 Days Discharge Instructions: Apply over primary dressing as directed. Secondary Dressing: ABD Pad, 5x9 (DME) (Generic) Every Other Day/30 Days Discharge Instructions: Apply over primary dressing as directed. Secured With: 39M Medipore H Soft Cloth Surgical T 4 x 2 (in/yd) (Generic) Every Other Day/30 Days ape Discharge Instructions: Secure dressing with tape as directed. Wound #13 - Groin Wound Laterality: Right Cleanser: Soap and Water Every Other Day/30 Days Discharge Instructions: May shower and wash wound with dial antibacterial soap and water prior to dressing change. Peri-Wound Care: Zinc Oxide Ointment 30g tube Every Other Day/30 Days Discharge Instructions: As needed Apply Zinc Oxide to periwound with each dressing change Prim Dressing: Hydrofera Blue Ready Foam, 2.5 x2.5 in (DME) (Generic) Every Other Day/30 Days ary Discharge Instructions: Apply to wound bed as instructed Secondary Dressing: Woven Gauze Sponge, Non-Sterile 4x4 in (Generic) Every Other Day/30 Days Discharge Instructions: Apply over primary dressing as directed. Secondary Dressing: ABD Pad, 5x9 (DME) (Generic) Every Other Day/30 Days Discharge Instructions: Apply over primary dressing as  directed. Secured With: 39M Medipore H Soft Cloth Surgical T 4 x 2 (in/yd) (Generic) Every Other Day/30 Days ape Discharge Instructions: Secure dressing with tape as directed. Wound #14 - Upper Leg Wound Laterality: Right, Lateral Cleanser: Soap and Water Every Other Day/30 Days Discharge Instructions: May shower and wash wound with dial antibacterial soap and water prior to dressing change. Peri-Wound Care: Zinc Oxide Ointment 30g tube Every Other Day/30 Days Discharge Instructions: As needed Apply Zinc Oxide to periwound with each dressing change Prim Dressing: Hydrofera Blue Ready Foam, 2.5 x2.5 in (DME) (Generic) Every Other Day/30 Days ary Discharge Instructions: Apply to wound bed as instructed Secondary Dressing: Woven Gauze Sponge, Non-Sterile 4x4 in (Generic) Every Other Day/30 Days Discharge Instructions: Apply over primary dressing as directed. Secondary Dressing: ABD Pad, 5x9 (DME) (Generic) Every Other Day/30 Days Discharge Instructions: Apply over primary dressing as directed. Secured With: 39M Medipore H Soft Cloth Surgical T 4 x 2 (in/yd) (Generic) Every Other Day/30 Days ape Discharge Instructions: Secure dressing with tape as directed. Wound #6 - Upper Leg Wound Laterality: Right Cleanser: Soap and Water Every Other Day/30 Days Discharge Instructions: May shower and wash wound with dial antibacterial soap and water prior to dressing change. Peri-Wound Care: Zinc Oxide Ointment 30g tube Every Other Day/30 Days Discharge Instructions: As needed Apply Zinc Oxide to periwound with each dressing change Prim Dressing: Hydrofera Blue Ready Foam, 2.5 x2.5 in (DME) (Generic) Every Other Day/30 Days ary Discharge Instructions: Apply to wound bed as instructed Secondary Dressing: Woven Gauze Sponge, Non-Sterile 4x4 in (Generic) Every Other Day/30 Days Discharge Instructions: Apply over primary dressing as directed. Secondary Dressing: ABD Pad, 5x9 (DME) (Generic) Every Other Day/30  Days Discharge Instructions: Apply over primary dressing as directed. Secured With: 39M Medipore H Soft Cloth Surgical T 4 x 2 (in/yd) (Generic) Every Other Day/30 Days ape Discharge Instructions: Secure dressing with tape as directed. Wound #8 - Upper Leg Wound Laterality: Left, Lateral Cleanser: Soap and Water Every Other Day/30 Days Discharge Instructions: May shower and wash wound with dial antibacterial soap and water prior to dressing change. Peri-Wound Care: Zinc Oxide Ointment 30g tube Every Other Day/30 Days Discharge Instructions:  As needed Apply Zinc Oxide to periwound with each dressing change Prim Dressing: Hydrofera Blue Ready Foam, 2.5 x2.5 in (DME) (Generic) Every Other Day/30 Days ary Discharge Instructions: Apply to wound bed as instructed Secondary Dressing: ABD Pad, 5x9 (DME) (Generic) Every Other Day/30 Days Discharge Instructions: Apply over primary dressing as directed. Secondary Dressing: Zetuvit Plus 4x8 in (DME) (Generic) Every Other Day/30 Days Discharge Instructions: Apply over primary dressing as directed. Secured With: 32M Medipore H Soft Cloth Surgical T 4 x 2 (in/yd) (DME) (Generic) Every Other Day/30 Days ape Discharge Instructions: Secure dressing with tape as directed. Electronic Signature(s) Signed: 12/14/2021 12:40:27 PM By: Kalman Shan DO Entered By: Kalman Shan on 12/14/2021 12:37:46 -------------------------------------------------------------------------------- Problem List Details Patient Name: Date of Service: [name], [name] 12/14/2021 10:45 A M Medical Record Number: [id number] Patient Account Number: [id number] Date of Birth/Sex: Treating RN: 1951-06-21 (71 y.o. [name] Primary Care Provider: [name] Other Clinician: Referring Provider: Treating Provider/Extender: Alysia Penna in Treatment: 32 Active Problems ICD-10 Encounter Code Description Active Date MDM Diagnosis L97.829  Non-pressure chronic ulcer of other part of left lower leg with unspecified 04/30/2021 No Yes severity L97.819 Non-pressure chronic ulcer of other part of right lower leg with unspecified 04/30/2021 No Yes severity E11.9 Type 2 diabetes mellitus without complications 4/85/4627 No Yes Z89.512 Acquired absence of left leg below knee 04/30/2021 No Yes S31.109D Unspecified open wound of abdominal wall, unspecified quadrant without 09/28/2021 No Yes penetration into peritoneal cavity, subsequent encounter Inactive Problems Resolved Problems Electronic Signature(s) Signed: 12/14/2021 12:40:27 PM By: Kalman Shan DO Entered By: Kalman Shan on 12/14/2021 12:30:56 -------------------------------------------------------------------------------- Progress Note Details Patient Name: Date of Service: [name], [name] 12/14/2021 10:45 A M Medical Record Number: [id number] Patient Account Number: [id number] Date of Birth/Sex: Treating RN: September 05, 1951 (71 y.o. [name] Primary Care Provider: [name] Other Clinician: Referring Provider: Treating Provider/Extender: Alysia Penna in Treatment: 32 Subjective Chief Complaint Information obtained from Patient Left lower extremity wounds and right upper leg wound, Pannus wounds History of Present Illness (HPI) Admission 04/30/2021 Ms. 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 She has been seen in our clinic multiple times last seen in 03/2019 for right toe wounds. Today she presents for 3 wounds. 1 is located to the right upper leg and 2 to the left lower extremity. They started at the end of December And not sure how I started. She had not been using any dressings up until 2 weeks ago. She is currently using silver alginate to the right upper leg and Santyl to the wounds on  the left leg. She has been on doxycycline and 2 rounds of clindamycin for cellulitis to these wounds. She finished her last round of antibiotics 3 weeks ago. She reports minimal pain to the wound. She currently denies signs of infection including increased erythema, warmth or purulent drainage. 7/5; patient presents for 1 week follow-up. She has been using Santyl to the left lower extremity wounds and silver alginate to the right upper leg wound. She denies signs of infection. She has no complaints today. 7/19; patient presents for 1 week follow-up. She has been using Santyl to the left lower extremity wounds and silver alginate to the right upper leg wound. She denies signs of infection. She has had to use a lot of Santyl to  the lateral wound since the necrotic tissue has been breaking down and the wound is deeper. 8/4; patient presents for 2-week follow-up. She has been using Santyl to the left lower extremity wound and silver alginate to the right upper leg wound. She has been using the wound VAC to the left lateral wound. She has no issues or complaints today. She denies signs of infection. 8/18; patient presents for 2-week follow-up. She has been using Santyl and silver alginate to the right upper wound. She reports that this is deeper today. She has been using the wound VAC to the left lateral wound. She has no issues or complaints today. She denies signs of infection. She reports that the left lower extremity medial wound is healed 9/1; patient presents for 2-week follow-up. She continues to use silver alginate to the right upper wound. She is using the wound VAC to the left lateral wound. She has no issues today. She denies signs of infection. She overall feels well. 10/10; patient presents for follow-up. Patient has not followed up in a month. She states that her husband was recently in the ICU but is now at home. She reports an increase in the wound size to the right lower extremity. She  reports improvement to the left lower extremity wound. She currently denies signs of infection. She states she overall feels well. 10/24; patient presents for follow-up. She has a new wound to the right upper thigh. She has been keeping the area covered until yesterday when she started silver alginate. She continues to use Dakin wet-to-dry dressings to the other right upper leg wound. She continues to use the [facility] to the left lower extremity wound. No obvious signs of infection on exam. 11/7; patient presents for follow-up. She again has new wounds to her pannus folds. She is keeping the area dry with absorbent pads and using silver alginate to the wound beds. She continues to use the wound VAC to the left lower extremity wound without issues. She denies signs of infection. 11/21; patient presents for follow-up. She continues to have new wounds to her pannus folds. Her daughter helps with dressing changes and is keeping the areas dry with absorbent pads and using silver alginate to the wound beds. She continues to use the wound VAC to the left lower extremity wound without issues. She currently denies signs of infection. 12/19; patient presents for follow-up. She continues to have new wounds to her pannus folds. Her daughter helps with the dressings and has been using Hibiclens and silver alginate to the wound beds. Patient continues to use the wound VAC to the left lower extremity wound. She has no issues or complaints today. She denies signs of infection. 1/16; patient presents for follow-up. She again has developed a new wound to the groin. She continues to use Hibiclens and silver alginate to the pannus/groin wounds. She continues to use the wound VAC to the left lower extremity without issues. She denies signs of infection. 2/6; patient presents for follow-up. She can no longer apply white foam under the VAC due to the tissue growing in and filling the tunnels. She continues to use Hibiclens and  silver alginate to the pannus/groin wounds. Patient History Information obtained from Patient. Family History Cancer - Child, Diabetes - Father, Heart Disease - Mother,Father, Hypertension - Mother,Father, No family history of Hereditary Spherocytosis, Kidney Disease, Lung Disease, Seizures, Stroke, Thyroid Problems, Tuberculosis. Social History Never smoker, Marital Status - Married, Alcohol Use - Never, Drug Use - No History, Caffeine  Use - Daily - coffee , sodas. Medical History Eyes Patient has history of Cataracts - lens implant done, Glaucoma Denies history of Optic Neuritis Ear/Nose/Mouth/Throat Patient has history of Chronic sinus problems/congestion - seasonal Denies history of Middle ear problems Hematologic/Lymphatic Denies history of Anemia, Hemophilia, Human Immunodeficiency Virus, Lymphedema, Sickle Cell Disease Respiratory Patient has history of Asthma Denies history of Aspiration, Chronic Obstructive Pulmonary Disease (COPD), Pneumothorax, Sleep Apnea, Tuberculosis Cardiovascular Patient has history of Hypertension - not on meds presently Denies history of Angina, Arrhythmia, Congestive Heart Failure, Coronary Artery Disease, Deep Vein Thrombosis, Hypotension, Myocardial Infarction, Peripheral Arterial Disease, Peripheral Venous Disease, Phlebitis, Vasculitis Gastrointestinal Denies history of Cirrhosis , Colitis, Crohnoos, Hepatitis A, Hepatitis B, Hepatitis C Endocrine Patient has history of Type II Diabetes Denies history of Type I Diabetes Genitourinary Denies history of End Stage Renal Disease Immunological Denies history of Lupus Erythematosus, Raynaudoos, Scleroderma Integumentary (Skin) Denies history of History of Burn Musculoskeletal Patient has history of Osteoarthritis Denies history of Gout, Rheumatoid Arthritis, Osteomyelitis Neurologic Patient has history of Neuropathy Denies history of Dementia, Quadriplegia, Paraplegia, Seizure  Disorder Oncologic Denies history of Received Chemotherapy, Received Radiation Psychiatric Denies history of Anorexia/bulimia, Confinement Anxiety Medical A Surgical History Notes nd Cardiovascular Hyperlipidemia Gastrointestinal GERD, Diverticulitis Genitourinary Overactive Bladder Musculoskeletal Fibromyalgia, toe amputations x 3 Psychiatric Anxiety Objective Constitutional respirations regular, non-labored and within target range for patient.. Vitals Time Taken: 10:46 AM, Height: 63 in, Weight: 279 lbs, BMI: 49.4, Temperature: 99 F, Pulse: 85 bpm, Respiratory Rate: 18 breaths/min, Blood Pressure: 151/76 mmHg. Psychiatric pleasant and cooperative. General Notes: Left lower extremity: Large open wound with granulation tissue and 2 areas with minimal increase in depth. No tunneling noted. Overall appears well-healing. Several wounds to the pannus folds and thigh folds all with granulation tissue. No signs of surrounding infection. Integumentary (Hair, Skin) Wound #10 status is Open. Original cause of wound was Gradually Appeared. The date acquired was: 09/14/2021. The wound has been in treatment 13 weeks. The wound is located on the Right Abdomen - Lower Quadrant. The wound measures 0.5cm length x 1.5cm width x 0.3cm depth; 0.589cm^2 area and 0.177cm^3 volume. There is Fat Layer (Subcutaneous Tissue) exposed. There is no tunneling or undermining noted. There is a medium amount of serosanguineous drainage noted. The wound margin is distinct with the outline attached to the wound base. There is large (67-100%) red, pink granulation within the wound bed. There is a small (1-33%) amount of necrotic tissue within the wound bed including Adherent Slough. Wound #11 status is Open. Original cause of wound was Gradually Appeared. The date acquired was: 09/14/2021. The wound has been in treatment 13 weeks. The wound is located on the Left,Medial Upper Leg. The wound measures 1.5cm length x  0.7cm width x 0.2cm depth; 0.825cm^2 area and 0.165cm^3 volume. There is Fat Layer (Subcutaneous Tissue) exposed. There is no tunneling or undermining noted. There is a medium amount of serosanguineous drainage noted. The wound margin is distinct with the outline attached to the wound base. There is large (67-100%) pink, pale granulation within the wound bed. There is a small (1-33%) amount of necrotic tissue within the wound bed including Adherent Slough. Wound #12 status is Open. Original cause of wound was Gradually Appeared. The date acquired was: 10/26/2021. The wound has been in treatment 7 weeks. The wound is located on the Left Abdomen - Lower Quadrant. The wound measures 3cm length x 1.6cm width x 0.2cm depth; 3.77cm^2 area and 0.754cm^3 volume. There is Fat Layer (Subcutaneous  Tissue) exposed. There is no tunneling or undermining noted. There is a medium amount of serosanguineous drainage noted. The wound margin is distinct with the outline attached to the wound base. There is large (67-100%) red, pink granulation within the wound bed. There is a small (1-33%) amount of necrotic tissue within the wound bed including Adherent Slough. Wound #13 status is Open. Original cause of wound was Gradually Appeared. The date acquired was: 11/23/2021. The wound has been in treatment 3 weeks. The wound is located on the Right Groin. The wound measures 0.8cm length x 1cm width x 0.1cm depth; 0.628cm^2 area and 0.063cm^3 volume. There is Fat Layer (Subcutaneous Tissue) exposed. There is no tunneling or undermining noted. There is a medium amount of serosanguineous drainage noted. The wound margin is distinct with the outline attached to the wound base. There is large (67-100%) red granulation within the wound bed. There is no necrotic tissue within the wound bed. Wound #14 status is Open. Original cause of wound was Pressure Injury. The date acquired was: 12/07/2021. The wound is located on the Right,Lateral  Upper Leg. The wound measures 1cm length x 1.2cm width x 0.1cm depth; 0.942cm^2 area and 0.094cm^3 volume. There is Fat Layer (Subcutaneous Tissue) exposed. There is no tunneling or undermining noted. There is a medium amount of serosanguineous drainage noted. The wound margin is distinct with the outline attached to the wound base. There is large (67-100%) red granulation within the wound bed. There is no necrotic tissue within the wound bed. Wound #6 status is Open. Original cause of wound was Gradually Appeared. The date acquired was: 11/06/2020. The wound has been in treatment 32 weeks. The wound is located on the Right Upper Leg. The wound measures 1cm length x 1.4cm width x 1.8cm depth; 1.1cm^2 area and 1.979cm^3 volume. There is Fat Layer (Subcutaneous Tissue) exposed. There is no tunneling or undermining noted. There is a medium amount of serosanguineous drainage noted. The wound margin is distinct with the outline attached to the wound base. There is large (67-100%) pink, pale, friable granulation within the wound bed. There is a small (1-33%) amount of necrotic tissue within the wound bed including Adherent Slough. Wound #8 status is Open. Original cause of wound was Gradually Appeared. The date acquired was: 01/06/2021. The wound has been in treatment 32 weeks. The wound is located on the Left,Lateral Upper Leg. The wound measures 12cm length x 6.2cm width x 1.7cm depth; 58.434cm^2 area and 99.337cm^3 volume. There is Fat Layer (Subcutaneous Tissue) exposed. There is no tunneling or undermining noted. There is a large amount of serosanguineous drainage noted. Foul odor after cleansing was noted. The wound margin is distinct with the outline attached to the wound base. There is large (67-100%) red, friable granulation within the wound bed. There is no necrotic tissue within the wound bed. 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 Patient would like a trial off the wound VAC and I think we can go ahead and do a dressing change with Hydrofera Blue every other day. For the pannus and groin wounds I recommended also switching to Hydrofera Blue to see if this will help. Unfortunately these  areas will be hard to heal due to the fact that they are located in folds. Procedures Wound #14 Pre-procedure diagnosis of Wound #14 is a Pressure Ulcer located on the Right,Lateral Upper Leg . There was a Chemical/Enzymatic/Mechanical debridement performed by Kalman Shan, DO. With the following instrument(s): Gauze. Other agent used was gauze and wound cleanser. A time out was conducted at 11:09, prior to the start of the procedure. There was no bleeding. The procedure was tolerated well. Post Debridement Measurements: 1cm length x 1.2cm width x 0.1cm depth; 0.094cm^3 volume. Post debridement Stage noted as Category/Stage III. Character of Wound/Ulcer Post Debridement is stable. Post procedure Diagnosis Wound #14: Same as Pre-Procedure Plan Follow-up Appointments: Return Appointment in 2 weeks. - Dr. Heber McKee - ***STRETCHER - 60 minutes extra time*** Bathing/ Shower/ Hygiene: Other Bathing/Shower/Hygiene Orders/Instructions: - You may sponge bathe use hibiclens wash with dressing changes with abdominal skin folds. Off-Loading: Turn and reposition every 2 hours WOUND #10: - Abdomen - Lower Quadrant Wound Laterality: Right Cleanser: Soap and Water Every Other Day/30 Days Discharge Instructions: May shower and wash wound with dial antibacterial soap and water prior  to dressing change. Peri-Wound Care: Zinc Oxide Ointment 30g tube Every Other Day/30 Days Discharge Instructions: As needed Apply Zinc Oxide to periwound with each dressing change Prim Dressing: Hydrofera Blue Ready Foam, 2.5 x2.5 in (DME) (Generic) Every Other Day/30 Days ary Discharge Instructions: Apply to wound bed as instructed Secondary Dressing: Woven Gauze Sponge, Non-Sterile 4x4 in (Generic) Every Other Day/30 Days Discharge Instructions: Apply over primary dressing as directed. Secondary Dressing: ABD Pad, 5x9 (DME) (Generic) Every Other Day/30 Days Discharge Instructions: Apply over primary dressing as directed. Secured With: 80M Medipore H Soft Cloth Surgical T 4 x 2 (in/yd) (Generic) Every Other Day/30 Days ape Discharge Instructions: Secure dressing with tape as directed. WOUND #11: - Upper Leg Wound Laterality: Left, Medial Cleanser: Soap and Water Every Other Day/30 Days Discharge Instructions: May shower and wash wound with dial antibacterial soap and water prior to dressing change. Peri-Wound Care: Zinc Oxide Ointment 30g tube Every Other Day/30 Days Discharge Instructions: As needed Apply Zinc Oxide to periwound with each dressing change Prim Dressing: Hydrofera Blue Ready Foam, 2.5 x2.5 in (DME) (Generic) Every Other Day/30 Days ary Discharge Instructions: Apply to wound bed as instructed Secondary Dressing: Woven Gauze Sponge, Non-Sterile 4x4 in (Generic) Every Other Day/30 Days Discharge Instructions: Apply over primary dressing as directed. Secondary Dressing: ABD Pad, 5x9 (DME) (Generic) Every Other Day/30 Days Discharge Instructions: Apply over primary dressing as directed. Secured With: 80M Medipore H Soft Cloth Surgical T 4 x 2 (in/yd) (Generic) Every Other Day/30 Days ape Discharge Instructions: Secure dressing with tape as directed. WOUND #12: - Abdomen - Lower Quadrant Wound Laterality: Left Cleanser: Soap and Water Every Other Day/30 Days Discharge  Instructions: May shower and wash wound with dial antibacterial soap and water prior to dressing change. Peri-Wound Care: Zinc Oxide Ointment 30g tube Every Other Day/30 Days Discharge Instructions: As needed Apply Zinc Oxide to periwound with each dressing change Prim Dressing: Hydrofera Blue Ready Foam, 2.5 x2.5 in (DME) (Generic) Every Other Day/30 Days ary Discharge Instructions: Apply to wound bed as instructed Secondary Dressing: Woven Gauze Sponge, Non-Sterile 4x4 in (Generic) Every Other Day/30 Days Discharge Instructions: Apply over primary dressing as directed. Secondary Dressing: ABD Pad, 5x9 (DME) (Generic) Every Other Day/30 Days Discharge Instructions: Apply over primary dressing as directed. Secured With: 80M Medipore H Soft Cloth Surgical T 4 x 2 (in/yd) (Generic)  Every Other Day/30 Days ape Discharge Instructions: Secure dressing with tape as directed. WOUND #13: - Groin Wound Laterality: Right Cleanser: Soap and Water Every Other Day/30 Days Discharge Instructions: May shower and wash wound with dial antibacterial soap and water prior to dressing change. Peri-Wound Care: Zinc Oxide Ointment 30g tube Every Other Day/30 Days Discharge Instructions: As needed Apply Zinc Oxide to periwound with each dressing change Prim Dressing: Hydrofera Blue Ready Foam, 2.5 x2.5 in (DME) (Generic) Every Other Day/30 Days ary Discharge Instructions: Apply to wound bed as instructed Secondary Dressing: Woven Gauze Sponge, Non-Sterile 4x4 in (Generic) Every Other Day/30 Days Discharge Instructions: Apply over primary dressing as directed. Secondary Dressing: ABD Pad, 5x9 (DME) (Generic) Every Other Day/30 Days Discharge Instructions: Apply over primary dressing as directed. Secured With: 45M Medipore H Soft Cloth Surgical T 4 x 2 (in/yd) (Generic) Every Other Day/30 Days ape Discharge Instructions: Secure dressing with tape as directed. WOUND #14: - Upper Leg Wound Laterality: Right,  Lateral Cleanser: Soap and Water Every Other Day/30 Days Discharge Instructions: May shower and wash wound with dial antibacterial soap and water prior to dressing change. Peri-Wound Care: Zinc Oxide Ointment 30g tube Every Other Day/30 Days Discharge Instructions: As needed Apply Zinc Oxide to periwound with each dressing change Prim Dressing: Hydrofera Blue Ready Foam, 2.5 x2.5 in (DME) (Generic) Every Other Day/30 Days ary Discharge Instructions: Apply to wound bed as instructed Secondary Dressing: Woven Gauze Sponge, Non-Sterile 4x4 in (Generic) Every Other Day/30 Days Discharge Instructions: Apply over primary dressing as directed. Secondary Dressing: ABD Pad, 5x9 (DME) (Generic) Every Other Day/30 Days Discharge Instructions: Apply over primary dressing as directed. Secured With: 45M Medipore H Soft Cloth Surgical T 4 x 2 (in/yd) (Generic) Every Other Day/30 Days ape Discharge Instructions: Secure dressing with tape as directed. WOUND #6: - Upper Leg Wound Laterality: Right Cleanser: Soap and Water Every Other Day/30 Days Discharge Instructions: May shower and wash wound with dial antibacterial soap and water prior to dressing change. Peri-Wound Care: Zinc Oxide Ointment 30g tube Every Other Day/30 Days Discharge Instructions: As needed Apply Zinc Oxide to periwound with each dressing change Prim Dressing: Hydrofera Blue Ready Foam, 2.5 x2.5 in (DME) (Generic) Every Other Day/30 Days ary Discharge Instructions: Apply to wound bed as instructed Secondary Dressing: Woven Gauze Sponge, Non-Sterile 4x4 in (Generic) Every Other Day/30 Days Discharge Instructions: Apply over primary dressing as directed. Secondary Dressing: ABD Pad, 5x9 (DME) (Generic) Every Other Day/30 Days Discharge Instructions: Apply over primary dressing as directed. Secured With: 45M Medipore H Soft Cloth Surgical T 4 x 2 (in/yd) (Generic) Every Other Day/30 Days ape Discharge Instructions: Secure dressing with tape  as directed. WOUND #8: - Upper Leg Wound Laterality: Left, Lateral Cleanser: Soap and Water Every Other Day/30 Days Discharge Instructions: May shower and wash wound with dial antibacterial soap and water prior to dressing change. Peri-Wound Care: Zinc Oxide Ointment 30g tube Every Other Day/30 Days Discharge Instructions: As needed Apply Zinc Oxide to periwound with each dressing change Prim Dressing: Hydrofera Blue Ready Foam, 2.5 x2.5 in (DME) (Generic) Every Other Day/30 Days ary Discharge Instructions: Apply to wound bed as instructed Secondary Dressing: ABD Pad, 5x9 (DME) (Generic) Every Other Day/30 Days Discharge Instructions: Apply over primary dressing as directed. Secondary Dressing: Zetuvit Plus 4x8 in (DME) (Generic) Every Other Day/30 Days Discharge Instructions: Apply over primary dressing as directed. Secured With: 45M Medipore H Soft Cloth Surgical T 4 x 2 (in/yd) (DME) (Generic) Every Other Day/30 Days ape  Discharge Instructions: Secure dressing with tape as directed. 1. Hydrofera Blue 2. Follow-up in 2 weeks Electronic Signature(s) Signed: 12/14/2021 12:40:27 PM By: Kalman Shan DO Entered By: Kalman Shan on 12/14/2021 12:39:29 -------------------------------------------------------------------------------- HxROS Details Patient Name: Date of Service: [name], [name] 12/14/2021 10:45 A M Medical Record Number: [id number] Patient Account Number: [id number] Date of Birth/Sex: Treating RN: 03-03-1951 (71 y.o. [name] Primary Care Provider: [name] Other Clinician: Referring Provider: Treating Provider/Extender: Alysia Penna in Treatment: 46 Information Obtained From Patient Eyes Medical History: Positive for: Cataracts - lens implant done; Glaucoma Negative for: Optic Neuritis Ear/Nose/Mouth/Throat Medical History: Positive for: Chronic sinus problems/congestion - seasonal Negative for: Middle ear  problems Hematologic/Lymphatic Medical History: Negative for: Anemia; Hemophilia; Human Immunodeficiency Virus; Lymphedema; Sickle Cell Disease Respiratory Medical History: Positive for: Asthma Negative for: Aspiration; Chronic Obstructive Pulmonary Disease (COPD); Pneumothorax; Sleep Apnea; Tuberculosis Cardiovascular Medical History: Positive for: Hypertension - not on meds presently Negative for: Angina; Arrhythmia; Congestive Heart Failure; Coronary Artery Disease; Deep Vein Thrombosis; Hypotension; Myocardial Infarction; Peripheral Arterial Disease; Peripheral Venous Disease; Phlebitis; Vasculitis Past Medical History Notes: Hyperlipidemia Gastrointestinal Medical History: Negative for: Cirrhosis ; Colitis; Crohns; Hepatitis A; Hepatitis B; Hepatitis C Past Medical History Notes: GERD, Diverticulitis Endocrine Medical History: Positive for: Type II Diabetes Negative for: Type I Diabetes Time with diabetes: 12 years Treated with: Insulin Blood sugar tested every day: Yes T ested : bid Blood sugar testing results: Breakfast: 140 max; Bedtime: 220's max Genitourinary Medical History: Negative for: End Stage Renal Disease Past Medical History Notes: Overactive Bladder Immunological Medical History: Negative for: Lupus Erythematosus; Raynauds; Scleroderma Integumentary (Skin) Medical History: Negative for: History of Burn Musculoskeletal Medical History: Positive for: Osteoarthritis Negative for: Gout; Rheumatoid Arthritis; Osteomyelitis Past Medical History Notes: Fibromyalgia, toe amputations x 3 Neurologic Medical History: Positive for: Neuropathy Negative for: Dementia; Quadriplegia; Paraplegia; Seizure Disorder Oncologic Medical History: Negative for: Received Chemotherapy; Received Radiation Psychiatric Medical History: Negative for: Anorexia/bulimia; Confinement Anxiety Past Medical History Notes: Anxiety HBO Extended History  Items Ear/Nose/Mouth/Throat: Eyes: Eyes: Chronic sinus Cataracts Glaucoma problems/congestion Immunizations Pneumococcal Vaccine: Received Pneumococcal Vaccination: Yes Received Pneumococcal Vaccination On or After 60th Birthday: No Immunization Notes: up to date on pneumonia and tetanus shots Implantable Devices None Family and Social History Cancer: Yes - Child; Diabetes: Yes - Father; Heart Disease: Yes - Mother,Father; Hereditary Spherocytosis: No; Hypertension: Yes - Mother,Father; Kidney Disease: No; Lung Disease: No; Seizures: No; Stroke: No; Thyroid Problems: No; Tuberculosis: No; Never smoker; Marital Status - Married; Alcohol Use: Never; Drug Use: No History; Caffeine Use: Daily - coffee , sodas; Financial Concerns: No; Food, Clothing or Shelter Needs: No; Support System Lacking: No; Transportation Concerns: No Electronic Signature(s) Signed: 12/14/2021 12:40:27 PM By: Kalman Shan DO Signed: 12/14/2021 4:43:35 PM By: Lorrin Jackson Entered By: Kalman Shan on 12/14/2021 12:36:12 -------------------------------------------------------------------------------- SuperBill Details Patient Name: Date of Service: [name], [name] 12/14/2021 Medical Record Number: [id number] Patient Account Number: [id number] Date of Birth/Sex: Treating RN: 07/25/1951 (71 y.o. [name] Primary Care Provider: [name] Other Clinician: Referring Provider: Treating Provider/Extender: Alysia Penna in Treatment: 32 Diagnosis Coding ICD-10 Codes Code Description [phone] 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ity L97.819 Non-pressure chronic ulcer of other part of right lower leg with unspecified severity E11.9 Type 2 diabetes mellitus without complications D66.440 Acquired absence of left leg below knee S31.109D Unspecified open wound of abdominal wall, unspecified  quadrant without penetration into  peritoneal cavity, subsequent encounter Facility Procedures CPT4 Code: [id number] Description: [phone] - DEBRIDE W/O ANES NON SELECT ICD-10 Diagnosis Description L97.819 Non-pressure chronic ulcer of other part of right lower leg with unspecified sev Modifier: erity Quantity: 1 Physician Procedures : CPT4 Code Description Modifier [id number] - WC PHYS LEVEL 3 - EST PT ICD-10 Diagnosis Description 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ity L97.819 Non-pressure chronic ulcer of other part of right  lower leg with unspecified severity E11.9 Type 2 diabetes mellitus without complications L59.747V Unspecified open wound of abdominal wall, unspecified quadrant without penetration into peritoneal cav subsequent encounter Quantity: 1 ity, Electronic Signature(s) Signed: 12/14/2021 12:40:27 PM By: Kalman Shan DO Entered By: Kalman Shan on 12/14/2021 12:39:55

## 2021-12-28 ENCOUNTER — Encounter (HOSPITAL_BASED_OUTPATIENT_CLINIC_OR_DEPARTMENT_OTHER): Payer: Medicare Other | Admitting: Internal Medicine

## 2022-01-07 ENCOUNTER — Encounter (HOSPITAL_BASED_OUTPATIENT_CLINIC_OR_DEPARTMENT_OTHER): Payer: Medicare Other | Admitting: Internal Medicine

## 2022-01-21 ENCOUNTER — Encounter (HOSPITAL_BASED_OUTPATIENT_CLINIC_OR_DEPARTMENT_OTHER): Payer: Medicare Other | Attending: Internal Medicine | Admitting: Internal Medicine

## 2022-01-21 ENCOUNTER — Other Ambulatory Visit: Payer: Self-pay

## 2022-01-21 DIAGNOSIS — I10 Essential (primary) hypertension: Secondary | ICD-10-CM | POA: Diagnosis not present

## 2022-01-21 DIAGNOSIS — L97819 Non-pressure chronic ulcer of other part of right lower leg with unspecified severity: Secondary | ICD-10-CM | POA: Insufficient documentation

## 2022-01-21 DIAGNOSIS — E1142 Type 2 diabetes mellitus with diabetic polyneuropathy: Secondary | ICD-10-CM | POA: Diagnosis not present

## 2022-01-21 DIAGNOSIS — L97829 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ity: Secondary | ICD-10-CM | POA: Diagnosis present

## 2022-01-21 DIAGNOSIS — X58XXXA Exposure to other specified factors, initial encounter: Secondary | ICD-10-CM | POA: Diagnosis not present

## 2022-01-21 DIAGNOSIS — Z89512 Acquired absence of left leg below knee: Secondary | ICD-10-CM | POA: Diagnosis not present

## 2022-01-21 DIAGNOSIS — S31109D Unspecified open wound of abdominal wall, unspecified quadrant without penetration into peritoneal cavity, subsequent encounter: Secondary | ICD-10-CM | POA: Insufficient documentation

## 2022-01-21 NOTE — Progress Notes (Signed)
Yesenia Payne ([id number]) ?Visit Report for 01/21/2022 ?Chief Complaint Document Details ?Patient Name: Date of Service: ?Yesenia Payne, [name] 01/21/2022 11:00 A M ?Medical Record Number: [id number] ?Patient Account Number: [id number] ?Date of Birth/Sex: Treating RN: ?10/17/1951 (71 y.o. F) ?Primary Care Provider: [name] Other Clinician: ?Referring Provider: ?Treating Provider/Extender: Kalman Shan ?[name] ?Weeks in Treatment: 38 ?Information Obtained from: Patient ?Chief Complaint ?Left lower extremity wounds and right upper leg wound, Pannus wounds ?Electronic Signature(s) ?Signed: 01/21/2022 2:11:53 PM By: Kalman Shan DO ?Entered By: Kalman Shan on 01/21/2022 13:54:25 ?-------------------------------------------------------------------------------- ?Debridement Details ?Patient Name: Date of Service: ?Yesenia Payne, [name] 01/21/2022 11:00 A M ?Medical Record Number: [id number] ?Patient Account Number: [id number] ?Date of Birth/Sex: Treating RN: ?1951/03/14 (71 y.o. F) Sharyn Creamer ?Primary Care Provider: [name] Other Clinician: ?Referring Provider: ?Treating Provider/Extender: Kalman Shan ?[name] ?Weeks in Treatment: 38 ?Debridement Performed for Assessment: Wound #14 Right,Lateral Upper Leg ?Performed By: Physician Kalman Shan, DO ?Debridement Type: Debridement ?Level of Consciousness (Pre-procedure): Awake and Alert ?Pre-procedure Verification/Time Out Yes - 11:55 ?Taken: ?Start Time: 11:55 ?Pain Control: Lidocaine ?T Area Debrided (L x W): ?otal 4.78 (cm) x 6.5 (cm) = 31.07 (cm?) ?Tissue and other material debrided: Viable, Non-Viable, Slough, Subcutaneous, Slough ?Level: Skin/Subcutaneous Tissue ?Debridement Description: Excisional ?Instrument: Curette ?Bleeding: Minimum ?Hemostasis Achieved: Pressure ?End Time: 11:55 ?Procedural Pain: 0 ?Post Procedural Pain: 0 ?Response to Treatment: Procedure was tolerated well ?Level of Consciousness (Post-  Awake and Alert ?procedure): ?Post Debridement Measurements of Total Wound ?Length: (cm) 4.78 ?Stage: Category/Stage III ?Width: (cm) 6.5 ?Depth: (cm) 1.4 ?Volume: (cm?) 34.163 ?Character of Wound/Ulcer Post Debridement: Improved ?Post Procedure Diagnosis ?Same as Pre-procedure ?Electronic Signature(s) ?Signed: 01/21/2022 2:11:53 PM By: Kalman Shan DO ?Signed: 01/21/2022 5:06:01 PM By: Sharyn Creamer RN, BSN ?Entered By: Sharyn Creamer on 01/21/2022 11:56:12 ?-------------------------------------------------------------------------------- ?HPI Details ?Patient Name: Date of Service: ?Yesenia Payne, [name] 01/21/2022 11:00 A M ?Medical Record Number: [id number] ?Patient Account Number: [id number] ?Date of Birth/Sex: Treating RN: ?[birth year] (71 y.o. F) ?Primary Care Provider: [name] Other Clinician: ?Referring Provider: ?Treating Provider/Extender: Kalman Shan ?[name] ?Weeks in Treatment: 38 ?History of Present Illness ?HPI Description: Admission 04/30/2021 ?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 She has been seen in our clinic multiple times last seen in 03/2019 for right toe wounds. Today she presents for 3 wounds. 1 is located ?to the right upper leg and 2 to the left lower extremity. They started at the end of December And not sure how I started. She had not been using any dressings ?up until 2 weeks ago. She is currently using silver alginate to the right upper leg and Santyl to the wounds on the left leg. She has been on doxycycline and 2 ?rounds of clindamycin for cellulitis to these wounds. She finished her last round of antibiotics 3 weeks ago. She reports minimal pain to the wound. She ?currently denies signs of infection including increased erythema, warmth or purulent drainage. ?7/5; patient presents for 1 week follow-up. She has been using  Santyl to the left lower extremity wounds and silver alginate to the right upper leg wound. She ?denies signs of infection. She has no complaints today. ?7/19; patient presents for 1 week follow-up. She has been using Santyl to the left lower extremity wounds and silver alginate to the right upper leg wound. She ?denies  signs of infection. She has had to use a lot of Santyl to the lateral wound since the necrotic tissue has been breaking down and the wound is deeper. ?8/4; patient presents for 2-week follow-up. She has been using Santyl to the left lower extremity wound and silver alginate to the right upper leg wound. She has ?been using the wound VAC to the left lateral wound. She has no issues or complaints today. She denies signs of infection. ?8/18; patient presents for 2-week follow-up. She has been using Santyl and silver alginate to the right upper wound. She reports that this is deeper today. She ?has been using the wound VAC to the left lateral wound. She has no issues or complaints today. She denies signs of infection. She reports that the left lower ?extremity medial wound is healed ?9/1; patient presents for 2-week follow-up. She continues to use silver alginate to the right upper wound. She is using the wound VAC to the left lateral wound. ?She has no issues today. She denies signs of infection. She overall feels well. ?10/10; patient presents for follow-up. Patient has not followed up in a month. She states that her husband was recently in the ICU but is now at home. She ?reports an increase in the wound size to the right lower extremity. She reports improvement to the left lower extremity wound. She currently denies signs of ?infection. She states she overall feels well. ?10/24; patient presents for follow-up. She has a new wound to the right upper thigh. She has been keeping the area covered until yesterday when she started ?silver alginate. She continues to use Dakin wet-to-dry dressings to the  other right upper leg wound. She continues to use the [facility] to the left lower extremity ?wound. No obvious signs of infection on exam. ?11/7; patient presents for follow-up. She again has new wounds to her pannus folds. She is keeping the area dry with absorbent pads and using silver alginate ?to the wound beds. She continues to use the wound VAC to the left lower extremity wound without issues. She denies signs of infection. ?11/21; patient presents for follow-up. She continues to have new wounds to her pannus folds. Her daughter helps with dressing changes and is keeping the ?areas dry with absorbent pads and using silver alginate to the wound beds. She continues to use the wound VAC to the left lower extremity wound without ?issues. She currently denies signs of infection. ?12/19; patient presents for follow-up. She continues to have new wounds to her pannus folds. Her daughter helps with the dressings and has been using ?Hibiclens and silver alginate to the wound beds. Patient continues to use the wound VAC to the left lower extremity wound. She has no issues or complaints ?today. She denies signs of infection. ?1/16; patient presents for follow-up. She again has developed a new wound to the groin. She continues to use Hibiclens and silver alginate to the pannus/groin ?wounds. She continues to use the wound VAC to the left lower extremity without issues. She denies signs of infection. ?2/6; patient presents for follow-up. She can no longer apply white foam under the VAC due to the tissue growing in and filling the tunnels. She continues to use ?Hibiclens and silver alginate to the pannus/groin wounds. ?3/16; patient presents for follow-up. She has been using Hydrofera Blue to the left lateral leg wound. She has been using hydroferra to the pannus/groin ?wounds. She has no issues or complaints today. ?Electronic Signature(s) ?Signed: 01/21/2022 2:11:53 PM By: Kalman Shan  DO ?Signed: 01/21/2022 2:11:53 PM By:  Kalman Shan DO ?Entered By: Kalman Shan on 01/21/2022 13:55:48 ?-------------------------------------------------------------------------------- ?Physical Exam Details ?Patient Name: Date of Service:

## 2022-01-22 NOTE — Progress Notes (Signed)
TASHE, PURDON ([id number]) ?Visit Report for 01/21/2022 ?Arrival Information Details ?Patient Name: Date of Service: ?[name], [name] 01/21/2022 11:00 A M ?Medical Record Number: [id number] ?Patient Account Number: [id number] ?Date of Birth/Sex: Treating RN: ?06-11-1951 (71 y.o. F) Sharyn Creamer ?Primary Care Magdalen Cabana: [name] Other Clinician: ?Referring Omesha Bowerman: ?Treating Lakishia Bourassa/Extender: Kalman Shan ?[name] ?Weeks in Treatment: 38 ?Visit Information History Since Last Visit ?All ordered tests and consults were completed: No ?Patient Arrived: Stretcher ?Added or deleted any medications: No ?Arrival Time: 11:03 ?Any new allergies or adverse reactions: No ?Accompanied By: daughter ?Had a fall or experienced change in No ?Transfer Assistance: Stretcher ?activities of daily living that may affect ?Patient Identification Verified: Yes ?risk of falls: ?Secondary Verification Process Completed: Yes ?Signs or symptoms of abuse/neglect since last visito No ?Patient Requires Transmission-Based Precautions: No ?Hospitalized since last visit: No ?Patient Has Alerts: No ?Implantable device outside of the clinic excluding No ?cellular tissue based products placed in the center ?since last visit: ?Has Dressing in Place as Prescribed: Yes ?Pain Present Now: Yes ?Electronic Signature(s) ?Signed: 01/21/2022 5:06:01 PM By: Sharyn Creamer RN, BSN ?Entered By: Sharyn Creamer on 01/21/2022 11:04:42 ?-------------------------------------------------------------------------------- ?Encounter Discharge Information Details ?Patient Name: Date of Service: ?[name], [name] 01/21/2022 11:00 A M ?Medical Record Number: [id number] ?Patient Account Number: [id number] ?Date of Birth/Sex: Treating RN: ?June 17, 1951 (71 y.o. [name], [name] ?Primary Care Christophr Calix: [name] Other Clinician: ?Referring Siobahn Worsley: ?Treating Geralda Baumgardner/Extender: Kalman Shan ?[name] ?Weeks in Treatment: 38 ?Encounter  Discharge Information Items Post Procedure Vitals ?Discharge Condition: Stable ?Temperature (F): 98.7 ?Ambulatory Status: Stretcher ?Pulse (bpm): 74 ?Discharge Destination: Home ?Respiratory Rate (breaths/min): 17 ?Transportation: Ambulance ?Blood Pressure (mmHg): 134/74 ?Accompanied By: self ?Schedule Follow-up Appointment: Yes ?Clinical Summary of Care: Patient Declined ?Electronic Signature(s) ?Signed: 01/22/2022 12:57:05 PM By: Rhae Hammock RN ?Entered By: Rhae Hammock on 01/21/2022 12:37:32 ?-------------------------------------------------------------------------------- ?Lower Extremity Assessment Details ?Patient Name: ?Date of Service: ?MO RTO N, [name] 01/21/2022 11:00 A M ?Medical Record Number: [id number] ?Patient Account Number: [id number] ?Date of Birth/Sex: ?Treating RN: ?22-Aug-1951 (71 y.o. F) Sharyn Creamer ?Primary Care Annia Gomm: [name] ?Other Clinician: ?Referring Dhilan Brauer: ?Treating Angeleen Horney/Extender: Kalman Shan ?[name] ?Weeks in Treatment: 38 ?Electronic Signature(s) ?Signed: 01/21/2022 5:06:01 PM By: Sharyn Creamer RN, BSN ?Entered By: Sharyn Creamer on 01/21/2022 11:09:31 ?-------------------------------------------------------------------------------- ?Multi Wound Chart Details ?Patient Name: ?Date of Service: ?MO RTO N, [name] 01/21/2022 11:00 A M ?Medical Record Number: [id number] ?Patient Account Number: [id number] ?Date of Birth/Sex: ?Treating RN: ?10/25/1951 (71 y.o. F) ?Primary Care Aldair Rickel: [name] ?Other Clinician: ?Referring Therasa Lorenzi: ?Treating Haunani Dickard/Extender: Kalman Shan ?[name] ?Weeks in Treatment: 38 ?Vital Signs ?Height(in): 63 ?Capillary Blood Glucose(mg/dl): 107 ?Weight(lbs): 279 ?Pulse(bpm): 72 ?Body Mass Index(BMI): 49.4 ?Blood Pressure(mmHg): 139/95 ?Temperature(??F): 98.1 ?Respiratory Rate(breaths/min): 18 ?Photos: [10:No Photos Right Abdomen - Lower Quadrant] [11:No Photos Left, Medial Upper Leg] [12:No Photos Left  Abdomen - Lower Quadrant] ?Wound Location: [10:Gradually Appeared] [11:Gradually Appeared] [12:Gradually Appeared] ?Wounding Event: [10:Pressure Ulcer] [11:Pressure Ulcer] [12:Pressure Ulcer] ?Primary Etiology: [10:Cataracts, Glaucoma, Chronic sinus] [11:Cataracts, Glaucoma, Chronic sinus] [12:Cataracts, Glaucoma, Chronic sinus] ?Comorbid History: [10:problems/congestion, Asthma, Hypertension, Type II Diabetes, Osteoarthritis, Neuropathy 09/14/2021] [11:problems/congestion, Asthma, Hypertension, Type II Diabetes, Osteoarthritis, Neuropathy 09/14/2021] [12:problems/congestion,  ?Asthma, Hypertension, Type II Diabetes, Osteoarthritis, Neuropathy 10/26/2021] ?Date Acquired: [10:18] [11:18] [12:12] ?Weeks of Treatment: [10:Open] [11:Open] [12:Open] ?Wound Status: [10:No] [11:No] [12:No] ?Wound Recurrence: [10:2x5x0.5] [11:5.5x11.5x0.3] [12:1x1.1x0.1] ?Measurements L x W x D (cm) [10:7.854] [11:49.676] [12:0.864] ?A (cm?) : ?rea [10:3.927] [  11:14.903] [12:0.086] ?Volume (cm?) : [10:-1685.00%] [11:-41998.30%] [12:-37.60%] ?% Reduction in A rea: [10:-4362.50%] [11:-61995.80%] [12:72.60%] ?% Reduction in Volume: [10:No] [11:No] [12:No] ?Undermining: [10:Category/Stage III] [11:Category/Stage III] [12:Category/Stage III] ?Classification: [10:Medium] [11:Medium] [12:Medium] ?Exudate A mount: [10:Serosanguineous] [11:Serosanguineous] [12:Serosanguineous] ?Exudate Type: [10:red, brown] [11:red, brown] [12:red, brown] ?Exudate Color: [10:No] [11:No] [12:No] ?Foul Odor A Cleansing: [10:fter N/A] [11:N/A] [12:N/A] ?Odor A nticipated Due to Product ?Use: [10:Distinct, outline attached] [11:Distinct, outline attached] [12:Distinct, outline attached] ?Wound Margin: [10:Medium (34-66%)] [11:Large (67-100%)] [12:Large (67-100%)] ?Granulation A mount: [10:Red, Pink] [11:Pink, Pale] [12:Red, Pink] ?Granulation Quality: [10:Medium (34-66%)] [11:Small (1-33%)] [12:Small (1-33%)] ?Necrotic A mount: [10:Adherent Slough] [11:Adherent Slough]  [12:Adherent Slough] ?Necrotic Tissue: ?[10:Fat Layer (Subcutaneous Tissue): Yes Fat Layer (Subcutaneous Tissue): Yes Fat Layer (Subcutaneous Tissue): Yes] ?Exposed Structures: ?[10:Fascia: No Tendon: No Muscle: No Joint: No Bone: No Small (1-33%)] [11:Fascia: No Tendon: No Muscle: No Joint: No Bone: No None] [12:Fascia: No Tendon: No Muscle: No Joint: No Bone: No None] ?Epithelialization: [10:N/A] [11:N/A] [12:N/A] ?Debridement: [10:N/A] [11:N/A] [12:N/A] ?Pain Control: [10:N/A] [11:N/A] [12:N/A] ?Tissue Debrided: [10:N/A] [11:N/A] [12:N/A] ?Level: [10:N/A] [11:N/A] [12:N/A] ?Debridement A (sq cm): [10:rea N/A] [11:N/A] [12:N/A] ?Instrument: [10:N/A] [11:N/A] [12:N/A] ?Bleeding: [10:N/A] [11:N/A] [12:N/A] ?Hemostasis A chieved: [10:N/A] [11:N/A] [12:N/A] ?Procedural Pain: [10:N/A] [11:N/A] [12:N/A] ?Post Procedural Pain: [10:N/A] [11:N/A] [12:N/A] ?Debridement Treatment Response: [10:N/A] [11:N/A] [12:N/A] ?Post Debridement Measurements L x ?W x D (cm) [10:N/A] [11:N/A] [12:N/A] ?Post Debridement Volume: (cm?) [10:N/A] [11:N/A] [12:N/A] ?Post Debridement Stage: [10:N/A] [11:N/A] [12:N/A] ?Wound Number: _0 ?Photos: No Photos No Photos No Photos ?Right Groin Right, Lateral Upper Leg Right Upper Leg ?Wound Location: ?Gradually Appeared Pressure Injury Gradually Appeared ?Wounding Event: ?Pressure Ulcer Pressure Ulcer Diabetic Wound/Ulcer of the Lower ?Primary Etiology: ?Extremity ?Cataracts, Glaucoma, Chronic sinus Cataracts, Glaucoma, Chronic sinus N/A ?Comorbid History: ?problems/congestion, Asthma, problems/congestion, Asthma, ?Hypertension, Type II Diabetes, Hypertension, Type II Diabetes, ?Osteoarthritis, Neuropathy Osteoarthritis, Neuropathy ?11/23/2021 12/07/2021 11/06/2020 ?Date Acquired: ?8 5 19 ?Weeks of Treatment: ?Open Open Healed - Epithelialized ?Wound Status: ?No No No ?Wound Recurrence: ?2x8x1 4.78x6.5x1.4 0x0x0 ?Measurements L x W x D (cm) ?12.566 24.402 0 ?A (cm?) : ?rea ?12.566 34.163 0 ?Volume  (cm?) : ?-185.70% -2490.40% 100.00% ?% Reduction in A rea: ?-1328.00% -36243.60% 100.00% ?% Reduction in Volume: ?12 12 ?Starting Position 1 (o'clock): ?12 12 ?Ending Position 1 (o'clock): ?1 1.5 ?Maxim

## 2022-02-04 ENCOUNTER — Encounter (HOSPITAL_BASED_OUTPATIENT_CLINIC_OR_DEPARTMENT_OTHER): Payer: Medicare Other | Admitting: Internal Medicine

## 2022-02-04 DIAGNOSIS — L97829 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ity: Secondary | ICD-10-CM | POA: Diagnosis not present

## 2022-02-04 NOTE — Progress Notes (Signed)
Yesenia Payne, Yesenia Payne ([id number]) ?Visit Report for 02/04/2022 ?Chief Complaint Document Details ?Patient Name: Date of Service: ?MO Yesenia Payne, [name] 02/04/2022 10:30 A M ?Medical Record Number: [id number] ?Patient Account Number: [id number] ?Date of Birth/Sex: Treating RN: ?[birth year] (71 y.o. F) ?Primary Care Provider: Clovia Payne Other Clinician: ?Referring Provider: ?Treating Provider/Extender: Yesenia Payne ?Yesenia Payne ?Weeks in Treatment: 40 ?Information Obtained from: Patient ?Chief Complaint ?Left lower extremity wounds and right upper leg wound, Pannus wounds ?Electronic Signature(s) ?Signed: 02/04/2022 1:33:19 PM By: Yesenia Shan DO ?Entered By: Yesenia Payne on 02/04/2022 13:29:19 ?-------------------------------------------------------------------------------- ?Debridement Details ?Patient Name: Date of Service: ?MO Yesenia Payne, [name] 02/04/2022 10:30 A M ?Medical Record Number: [id number] ?Patient Account Number: [id number] ?Date of Birth/Sex: Treating RN: ?August 28, 1951 (71 y.o. F) Yesenia Payne, Yesenia Payne ?Primary Care Provider: Clovia Payne Other Clinician: ?Referring Provider: ?Treating Provider/Extender: Yesenia Payne ?Yesenia Payne ?Weeks in Treatment: 40 ?Debridement Performed for Assessment: Wound #8 Left,Lateral Upper Leg ?Performed By: Physician Yesenia Shan, DO ?Debridement Type: Debridement ?Severity of Tissue Pre Debridement: Fat layer exposed ?Level of Consciousness (Pre-procedure): Awake and Alert ?Pre-procedure Verification/Time Out Yes - 11:20 ?Taken: ?Start Time: 11:21 ?Pain Control: Lidocaine 4% T opical Solution ?T Area Debrided (L x W): ?otal 5 (cm) x 2 (cm) = 10 (cm?) ?Tissue and other material debrided: Viable, Non-Viable, Slough, Subcutaneous, Slough ?Level: Skin/Subcutaneous Tissue ?Debridement Description: Excisional ?Instrument: Curette ?Bleeding: Minimum ?Hemostasis Achieved: Pressure ?End Time: 11:28 ?Procedural Pain: 0 ?Post Procedural Pain: 0 ?Response to Treatment:  Procedure was tolerated well ?Level of Consciousness (Post- Awake and Alert ?procedure): ?Post Debridement Measurements of Total Wound ?Length: (cm) 9 ?Width: (cm) 2.4 ?Depth: (cm) 0.1 ?Volume: (cm?) 1.696 ?Character of Wound/Ulcer Post Debridement: Requires Further Debridement ?Severity of Tissue Post Debridement: Fat layer exposed ?Post Procedure Diagnosis ?Same as Pre-procedure ?Electronic Signature(s) ?Signed: 02/04/2022 1:33:19 PM By: Yesenia Shan DO ?Signed: 02/04/2022 4:54:01 PM By: Yesenia Pilling RN, BSN ?Entered By: Yesenia Payne on 02/04/2022 11:28:28 ?-------------------------------------------------------------------------------- ?HPI Details ?Patient Name: Date of Service: ?MO Yesenia Payne, [name] 02/04/2022 10:30 A M ?Medical Record Number: [id number] ?Patient Account Number: [id number] ?Date of Birth/Sex: Treating RN: ?1951-08-01 (71 y.o. F) ?Primary Care Provider: Clovia Payne Other Clinician: ?Referring Provider: ?Treating Provider/Extender: Yesenia Payne ?Yesenia Payne ?Weeks in Treatment: 40 ?History of Present Illness ?HPI Description: Admission 04/30/2021 ?Yesenia Payne is a 71 year old female with a past medical history of insulin-dependent type 2 diabetes with polyneuropathy, hypertension, and left BKA ?from necrotizing cellulitis. She has been seen in our clinic multiple times last seen in 03/2019 for right toe wounds. Today she presents for 3 wounds. 1 is located ?to the right upper leg and 2 to the left lower extremity. They started at the end of December And not sure how I started. She had not been using any dressings ?up until 2 weeks ago. She is currently using silver alginate to the right upper leg and Santyl to the wounds on the left leg. She has been on doxycycline and 2 ?rounds of clindamycin for cellulitis to these wounds. She finished her last round of antibiotics 3 weeks ago. She reports minimal pain to the wound. She ?currently denies signs of infection including increased  erythema, warmth or purulent drainage. ?7/5; patient presents for 1 week follow-up. She has been using Santyl to the left lower extremity wounds and silver alginate to the right upper leg wound. She ?denies signs of infection. She has no complaints today. ?7/19; patient presents for 1 week follow-up. She has been  using Santyl to the left lower extremity wounds and silver alginate to the right upper leg wound. She ?denies signs of infection. She has had to use a lot of Santyl to the lateral wound since the necrotic tissue has been breaking down and the wound is deeper. ?8/4; patient presents for 2-week follow-up. She has been using Santyl to the left lower extremity wound and silver alginate to the right upper leg wound. She has ?been using the wound VAC to the left lateral wound. She has no issues or complaints today. She denies signs of infection. ?8/18; patient presents for 2-week follow-up. She has been using Santyl and silver alginate to the right upper wound. She reports that this is deeper today. She ?has been using the wound VAC to the left lateral wound. She has no issues or complaints today. She denies signs of infection. She reports that the left lower ?extremity medial wound is healed ?9/1; patient presents for 2-week follow-up. She continues to use silver alginate to the right upper wound. She is using the wound VAC to the left lateral wound. ?She has no issues today. She denies signs of infection. She overall feels well. ?10/10; patient presents for follow-up. Patient has not followed up in a month. She states that her husband was recently in the ICU but is now at home. She ?reports an increase in the wound size to the right lower extremity. She reports improvement to the left lower extremity wound. She currently denies signs of ?infection. She states she overall feels well. ?10/24; patient presents for follow-up. She has a new wound to the right upper thigh. She has been keeping the area covered until  yesterday when she started ?silver alginate. She continues to use Dakin wet-to-dry dressings to the other right upper leg wound. She continues to use the [facility] to the left lower extremity ?wound. No obvious signs of infection on exam. ?11/7; patient presents for follow-up. She again has new wounds to her pannus folds. She is keeping the area dry with absorbent pads and using silver alginate ?to the wound beds. She continues to use the wound VAC to the left lower extremity wound without issues. She denies signs of infection. ?11/21; patient presents for follow-up. She continues to have new wounds to her pannus folds. Her daughter helps with dressing changes and is keeping the ?areas dry with absorbent pads and using silver alginate to the wound beds. She continues to use the wound VAC to the left lower extremity wound without ?issues. She currently denies signs of infection. ?12/19; patient presents for follow-up. She continues to have new wounds to her pannus folds. Her daughter helps with the dressings and has been using ?Hibiclens and silver alginate to the wound beds. Patient continues to use the wound VAC to the left lower extremity wound. She has no issues or complaints ?today. She denies signs of infection. ?1/16; patient presents for follow-up. She again has developed a new wound to the groin. She continues to use Hibiclens and silver alginate to the pannus/groin ?wounds. She continues to use the wound VAC to the left lower extremity without issues. She denies signs of infection. ?2/6; patient presents for follow-up. She can no longer apply white foam under the VAC due to the tissue growing in and filling the tunnels. She continues to use ?Hibiclens and silver alginate to the pannus/groin wounds. ?3/16; patient presents for follow-up. She has been using Hydrofera Blue to the left lateral leg wound. She has been using hydroferra to  the pannus/groin ?wounds. She has no issues or complaints today. ?3/30; patient  presents for follow-up. She has been using Hydrofera Blue and Santyl to the left lateral leg. She has been using Dakin's wet-to-dry dressings to ?the pannus/groin wounds. She denies signs of infectio

## 2022-02-04 NOTE — Progress Notes (Signed)
PRESLYN, WARR ([id number]) ?Visit Report for 02/04/2022 ?Arrival Information Details ?Patient Name: Date of Service: ?[name], [name] 02/04/2022 10:30 A M ?Medical Record Number: [id number] ?Patient Account Number: [id number] ?Date of Birth/Sex: Treating RN: ?1951/09/23 (71 y.o. F) Deaton, Bobbi ?Primary Care Joely Losier: [name] Other Clinician: ?Referring Ezekiel Menzer: ?Treating Izora Benn/Extender: Kalman Shan ?[name] ?Weeks in Treatment: 40 ?Visit Information History Since Last Visit ?Added or deleted any medications: No ?Patient Arrived: Stretcher ?Any new allergies or adverse reactions: No ?Arrival Time: 11:00 ?Had a fall or experienced change in No ?Accompanied By: daughter ?activities of daily living that may affect ?Transfer Assistance: Stretcher ?risk of falls: ?Patient Identification Verified: Yes ?Signs or symptoms of abuse/neglect since last visito No ?Secondary Verification Process Completed: Yes ?Hospitalized since last visit: No ?Patient Requires Transmission-Based Precautions: No ?Implantable device outside of the clinic excluding No ?Patient Has Alerts: No ?cellular tissue based products placed in the center ?since last visit: ?Has Dressing in Place as Prescribed: Yes ?Pain Present Now: No ?Electronic Signature(s) ?Signed: 02/04/2022 4:54:01 PM By: Deon Pilling RN, BSN ?Entered By: Deon Pilling on 02/04/2022 11:14:57 ?-------------------------------------------------------------------------------- ?Encounter Discharge Information Details ?Patient Name: Date of Service: ?[name], [name] 02/04/2022 10:30 A M ?Medical Record Number: [id number] ?Patient Account Number: [id number] ?Date of Birth/Sex: Treating RN: ?Jan 22, 1951 (71 y.o. F) Deaton, Bobbi ?Primary Care Cassara Nida: [name] Other Clinician: ?Referring Sharie Amorin: ?Treating Maricruz Lucero/Extender: Kalman Shan ?[name] ?Weeks in Treatment: 40 ?Encounter Discharge Information Items Post Procedure  Vitals ?Discharge Condition: Stable ?Temperature (F): 97.6 ?Ambulatory Status: Stretcher ?Pulse (bpm): 87 ?Discharge Destination: Home ?Respiratory Rate (breaths/min): 20 ?Transportation: Ambulance ?Blood Pressure (mmHg): 139/81 ?Accompanied By: daughter ?Schedule Follow-up Appointment: Yes ?Clinical Summary of Care: ?Electronic Signature(s) ?Signed: 02/04/2022 4:54:01 PM By: Deon Pilling RN, BSN ?Entered By: Deon Pilling on 02/04/2022 13:19:06 ?-------------------------------------------------------------------------------- ?Lower Extremity Assessment Details ?Patient Name: ?Date of Service: ?MO RTO N, [name] 02/04/2022 10:30 A M ?Medical Record Number: [id number] ?Patient Account Number: [id number] ?Date of Birth/Sex: ?Treating RN: ?[birth year] (71 y.o. F) Deaton, Bobbi ?Primary Care Mattelyn Imhoff: [name] ?Other Clinician: ?Referring Desteni Piscopo: ?Treating Tarri Guilfoil/Extender: Kalman Shan ?[name] ?Weeks in Treatment: 40 ?Electronic Signature(s) ?Signed: 02/04/2022 4:54:01 PM By: Deon Pilling RN, BSN ?Entered By: Deon Pilling on 02/04/2022 11:16:00 ?-------------------------------------------------------------------------------- ?Multi Wound Chart Details ?Patient Name: ?Date of Service: ?MO RTO N, [name] 02/04/2022 10:30 A M ?Medical Record Number: [id number] ?Patient Account Number: [id number] ?Date of Birth/Sex: ?Treating RN: ?01/07/1951 (71 y.o. F) ?Primary Care Ioanna Colquhoun: [name] ?Other Clinician: ?Referring Eryanna Regal: ?Treating Shanice Poznanski/Extender: Kalman Shan ?[name] ?Weeks in Treatment: 40 ?Vital Signs ?Height(in): 63 ?Capillary Blood Glucose(mg/dl): 171 ?Weight(lbs): 279 ?Pulse(bpm): 87 ?Body Mass Index(BMI): 49.4 ?Blood Pressure(mmHg): 139/81 ?Temperature(??F): 97.6 ?Respiratory Rate(breaths/min): 22 ?Photos: [10:No Photos Right Abdomen - Lower Quadrant] [11:No Photos Left, Medial Upper Leg] [12:No Photos Left Abdomen - Lower Quadrant] ?Wound Location: [10:Gradually  Appeared] [11:Gradually Appeared] [12:Gradually Appeared] ?Wounding Event: [10:Pressure Ulcer] [11:Pressure Ulcer] [12:Pressure Ulcer] ?Primary Etiology: [10:Cataracts, Glaucoma, Chronic sinus] [11:Cataracts, Glaucoma, Chronic sinus] [12:Cataracts, Glaucoma, Chronic sinus] ?Comorbid History: [10:problems/congestion, Asthma, Hypertension, Type II Diabetes, Osteoarthritis, Neuropathy 09/14/2021] [11:problems/congestion, Asthma, Hypertension, Type II Diabetes, Osteoarthritis, Neuropathy 09/14/2021] [12:problems/congestion,  ?Asthma, Hypertension, Type II Diabetes, Osteoarthritis, Neuropathy 10/26/2021] ?Date Acquired: [10:20] [11:20] [12:14] ?Weeks of Treatment: [10:Open] [11:Open] [12:Open] ?Wound Status: [10:No] [11:No] [12:No] ?Wound Recurrence: [10:0.4x1.8x0.4] [11:7x6x0.1] [12:0.5x3x0.1] ?Measurements L x W x D (cm) [10:0.565] [11:32.987] [12:1.178] ?A (cm?) : ?rea [10:0.226] [11:3.299] [12:0.118] ?Volume (cm?) : [10:-28.40%] [11:-27855.10%] [12:-87.60%] ?%  Reduction in A rea: [10:-156.80%] [11:-13645.80%] [12:62.40%] ?% Reduction in Volume: [10:Category/Stage III] [11:Category/Stage III] [12:Category/Stage III] ?Classification: [10:Medium] [11:Medium] [12:Medium] ?Exudate A mount: [10:Serosanguineous] [11:Serosanguineous] [12:Serosanguineous] ?Exudate Type: [10:red, brown] [11:red, brown] [12:red, brown] ?Exudate Color: [10:No] [11:No] [12:No] ?Foul Odor A Cleansing: [10:fter N/A] [11:N/A] [12:N/A] ?Odor A nticipated Due to Product ?Use: [10:Distinct, outline attached] [11:Distinct, outline attached] [12:Distinct, outline attached] ?Wound Margin: [10:Large (67-100%)] [11:Medium (34-66%)] [12:Large (67-100%)] ?Granulation A mount: [10:Red, Pink] [11:Pink, Pale] [12:Red, Pink] ?Granulation Quality: [10:Small (1-33%)] [11:Medium (34-66%)] [12:Small (1-33%)] ?Necrotic A mount: ?[10:Fat Layer (Subcutaneous Tissue): Yes Fat Layer (Subcutaneous Tissue): Yes Fat Layer (Subcutaneous Tissue): Yes] ?Exposed  Structures: ?[10:Fascia: No Tendon: No Muscle: No Joint: No Bone: No Small (1-33%)] [11:Fascia: No Tendon: No Muscle: No Joint: No Bone: No Large (67-100%)] [12:Fascia: No Tendon: No Muscle: No Joint: No Bone: No None] ?Epithelialization: [10:N/A] [11:N/A] [12:N/A] ?Debridement: [10:N/A] [11:N/A] [12:N/A] ?Pain Control: [10:N/A] [11:N/A] [12:N/A] ?Tissue Debrided: [10:N/A] [11:N/A] [12:N/A] ?Level: [10:N/A] [11:N/A] [12:N/A] ?Debridement A (sq cm): [10:rea N/A] [11:N/A] [12:N/A] ?Instrument: [10:N/A] [11:N/A] [12:N/A] ?Bleeding: [10:N/A] [11:N/A] [12:N/A] ?Hemostasis A chieved: [10:N/A] [11:N/A] [12:N/A] ?Procedural Pain: [10:N/A] [11:N/A] [12:N/A] ?Post Procedural Pain: [10:N/A] [11:N/A] [12:N/A] ?Debridement Treatment Response: [10:N/A] [11:N/A] [12:N/A] ?Post Debridement Measurements L x ?W x D (cm) [10:N/A] [11:N/A] [12:N/A] ?Post Debridement Volume: (cm?) [10:N/A] [11:N/A] [12:N/A] ?Wound Number: [id number]  ?Photos: No Photos No Photos No Photos ?Right Groin Right, Lateral Upper Leg Right, Distal Groin ?Wound Location: ?Gradually Appeared Pressure Injury Gradually Appeared ?Wounding Event: ?Pressure Ulcer Pressure Ulcer Incontinence Associated Dermatitis ?Primary Etiology: ?(IAD) ?Cataracts, Glaucoma, Chronic sinus Cataracts, Glaucoma, Chronic sinus Cataracts, Glaucoma, Chronic sinus ?Comorbid History: ?problems/congestion, Asthma, problems/congestion, Asthma, problems/congestion, Asthma, ?Hypertension, Type II Diabetes, Hypertension, Type II Diabetes, Hypertension, Type II Diabetes, ?Osteoarthritis, Neuropathy Osteoarthritis, Neuropathy Osteoarthritis, Neuropathy ?11/23/2021 12/07/2021 02/04/2022 ?Date Acquired: ?10 7 0 ?Weeks of Treatment: ?Open Open Open ?Wound Status: ?No No No ?Wound Recurrence: ?0.7x2.3x0.1 2x2.3x0.7 1x1x0.5 ?Measurements L x W x D (cm) ?1.264 3.613 0.785 ?A (cm?) : ?rea ?0.126 2.529 0.393 ?Volume (cm?) : ?71.30% -283.50% N/A ?% Reduction in Area: ?85.70% -2590.40% N/A ?% Reduction in  Volume: ?Category/Stage III Category/Stage III Full Thickness Without Exposed ?Classification: ?Support Structures ?Medium Medium Medium ?Exudate Amount: ?Serosanguineous Serosanguineous Serosanguineous ?Exudate Type: ?red, brown red, brown red, b

## 2022-03-04 ENCOUNTER — Encounter (HOSPITAL_BASED_OUTPATIENT_CLINIC_OR_DEPARTMENT_OTHER): Payer: Medicare Other | Admitting: Internal Medicine

## 2022-03-05 ENCOUNTER — Encounter (HOSPITAL_BASED_OUTPATIENT_CLINIC_OR_DEPARTMENT_OTHER): Payer: Medicare Other | Admitting: Internal Medicine

## 2022-03-16 ENCOUNTER — Encounter (HOSPITAL_BASED_OUTPATIENT_CLINIC_OR_DEPARTMENT_OTHER): Payer: Medicare Other | Attending: Internal Medicine | Admitting: Internal Medicine

## 2022-03-16 DIAGNOSIS — Z89512 Acquired absence of left leg below knee: Secondary | ICD-10-CM | POA: Insufficient documentation

## 2022-03-16 DIAGNOSIS — Z794 Long term (current) use of insulin: Secondary | ICD-10-CM | POA: Insufficient documentation

## 2022-03-16 DIAGNOSIS — X58XXXA Exposure to other specified factors, initial encounter: Secondary | ICD-10-CM | POA: Diagnosis not present

## 2022-03-16 DIAGNOSIS — L97829 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ity: Secondary | ICD-10-CM | POA: Diagnosis not present

## 2022-03-16 DIAGNOSIS — I10 Essential (primary) hypertension: Secondary | ICD-10-CM | POA: Diagnosis not present

## 2022-03-16 DIAGNOSIS — E1142 Type 2 diabetes mellitus with diabetic polyneuropathy: Secondary | ICD-10-CM | POA: Insufficient documentation

## 2022-03-16 DIAGNOSIS — L97819 Non-pressure chronic ulcer of other part of right lower leg with unspecified severity: Secondary | ICD-10-CM | POA: Insufficient documentation

## 2022-03-16 DIAGNOSIS — S31109A Unspecified open wound of abdominal wall, unspecified quadrant without penetration into peritoneal cavity, initial encounter: Secondary | ICD-10-CM | POA: Insufficient documentation

## 2022-03-16 NOTE — Progress Notes (Signed)
Yesenia Payne ([id number]) ?Visit Report for 03/16/2022 ?Chief Complaint Document Details ?Patient Name: Date of Service: ?MO Yesenia Payne, [name] 03/16/2022 2:15 PM ?Medical Record Number: [id number] ?Patient Account Number: [id number] ?Date of Birth/Sex: Treating RN: ?Mar 14, 1951 (71 y.o. F) Deaton, Bobbi ?Primary Care Provider: [name] Other Clinician: ?Referring Provider: ?Treating Provider/Extender: Kalman Shan ?[name] ?Weeks in Treatment: 22 ?Information Obtained from: Patient ?Chief Complaint ?Left lower extremity wounds and right upper leg wound, Pannus wounds ?Electronic Signature(s) ?Signed: 03/16/2022 4:19:40 PM By: Kalman Shan DO ?Entered By: Kalman Shan on 03/16/2022 16:00:14 ?-------------------------------------------------------------------------------- ?Debridement Details ?Patient Name: Date of Service: ?MO Yesenia Payne, [name] 03/16/2022 2:15 PM ?Medical Record Number: [id number] ?Patient Account Number: [id number] ?Date of Birth/Sex: Treating RN: ?October 03, 1951 (71 y.o. F) Deaton, Bobbi ?Primary Care Provider: [name] Other Clinician: ?Referring Provider: ?Treating Provider/Extender: Kalman Shan ?[name] ?Weeks in Treatment: 25 ?Debridement Performed for Assessment: Wound #8 Left,Lateral Upper Leg ?Performed By: Physician Kalman Shan, DO ?Debridement Type: Debridement ?Severity of Tissue Pre Debridement: Fat layer exposed ?Level of Consciousness (Pre-procedure): Awake and Alert ?Pre-procedure Verification/Time Out Yes - 15:20 ?Taken: ?Start Time: 15:21 ?Pain Control: Lidocaine 5% topical ointment ?T Area Debrided (L x W): ?otal 4 (cm) x 4 (cm) = 16 (cm?) ?Tissue and other material debrided: Viable, Non-Viable, Slough, Subcutaneous, Slough ?Level: Skin/Subcutaneous Tissue ?Debridement Description: Excisional ?Instrument: Curette ?Bleeding: Moderate ?Hemostasis Achieved: Pressure ?End Time: 15:29 ?Procedural Pain: 0 ?Post Procedural Pain: 0 ?Response to  Treatment: Procedure was tolerated well ?Level of Consciousness (Post- Awake and Alert ?procedure): ?Post Debridement Measurements of Total Wound ?Length: (cm) 11 ?Width: (cm) 7 ?Depth: (cm) 0.2 ?Volume: (cm?) 12.095 ?Character of Wound/Ulcer Post Debridement: Improved ?Severity of Tissue Post Debridement: Fat layer exposed ?Post Procedure Diagnosis ?Same as Pre-procedure ?Electronic Signature(s) ?Signed: 03/16/2022 4:19:40 PM By: Kalman Shan DO ?Signed: 03/16/2022 5:49:10 PM By: Deon Pilling RN, BSN ?Entered By: Deon Pilling on 03/16/2022 15:29:39 ?-------------------------------------------------------------------------------- ?HPI Details ?Patient Name: Date of Service: ?MO Yesenia Payne, [name] 03/16/2022 2:15 PM ?Medical Record Number: [id number] ?Patient Account Number: [id number] ?Date of Birth/Sex: Treating RN: ?[birth year] (71 y.o. F) Deaton, Bobbi ?Primary Care Provider: [name] Other Clinician: ?Referring Provider: ?Treating Provider/Extender: Kalman Shan ?[name] ?Weeks in Treatment: 88 ?History of Present Illness ?HPI Description: Admission 04/30/2021 ?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 She has been seen in our clinic multiple times last seen in 03/2019 for right toe wounds. Today she presents for 3 wounds. 1 is located ?to the right upper leg and 2 to the left lower extremity. They started at the end of December And not sure how I started. She had not been using any dressings ?up until 2 weeks ago. She is currently using silver alginate to the right upper leg and Santyl to the wounds on the left leg. She has been on doxycycline and 2 ?rounds of clindamycin for cellulitis to these wounds. She finished her last round of antibiotics 3 weeks ago. She reports minimal pain to the wound. She ?currently denies signs of infection including increased  erythema, warmth or purulent drainage. ?7/5; patient presents for 1 week follow-up. She has been using Santyl to the left lower extremity wounds and silver alginate to the right upper leg wound. She ?denies signs of infection. She has no complaints today. ?7/19; patient presents for 1 week follow-up. She has been using Santyl  to the left lower extremity wounds and silver alginate to the right upper leg wound. She ?denies signs of infection. She has had to use a lot of Santyl to the lateral wound since the necrotic tissue has been breaking down and the wound is deeper. ?8/4; patient presents for 2-week follow-up. She has been using Santyl to the left lower extremity wound and silver alginate to the right upper leg wound. She has ?been using the wound VAC to the left lateral wound. She has no issues or complaints today. She denies signs of infection. ?8/18; patient presents for 2-week follow-up. She has been using Santyl and silver alginate to the right upper wound. She reports that this is deeper today. She ?has been using the wound VAC to the left lateral wound. She has no issues or complaints today. She denies signs of infection. She reports that the left lower ?extremity medial wound is healed ?9/1; patient presents for 2-week follow-up. She continues to use silver alginate to the right upper wound. She is using the wound VAC to the left lateral wound. ?She has no issues today. She denies signs of infection. She overall feels well. ?10/10; patient presents for follow-up. Patient has not followed up in a month. She states that her husband was recently in the ICU but is now at home. She ?reports an increase in the wound size to the right lower extremity. She reports improvement to the left lower extremity wound. She currently denies signs of ?infection. She states she overall feels well. ?10/24; patient presents for follow-up. She has a new wound to the right upper thigh. She has been keeping the area covered until  yesterday when she started ?silver alginate. She continues to use Dakin wet-to-dry dressings to the other right upper leg wound. She continues to use the [facility] to the left lower extremity ?wound. No obvious signs of infection on exam. ?11/7; patient presents for follow-up. She again has new wounds to her pannus folds. She is keeping the area dry with absorbent pads and using silver alginate ?to the wound beds. She continues to use the wound VAC to the left lower extremity wound without issues. She denies signs of infection. ?11/21; patient presents for follow-up. She continues to have new wounds to her pannus folds. Her daughter helps with dressing changes and is keeping the ?areas dry with absorbent pads and using silver alginate to the wound beds. She continues to use the wound VAC to the left lower extremity wound without ?issues. She currently denies signs of infection. ?12/19; patient presents for follow-up. She continues to have new wounds to her pannus folds. Her daughter helps with the dressings and has been using ?Hibiclens and silver alginate to the wound beds. Patient continues to use the wound VAC to the left lower extremity wound. She has no issues or complaints ?today. She denies signs of infection. ?1/16; patient presents for follow-up. She again has developed a new wound to the groin. She continues to use Hibiclens and silver alginate to the pannus/groin ?wounds. She continues to use the wound VAC to the left lower extremity without issues. She denies signs of infection. ?2/6; patient presents for follow-up. She can no longer apply white foam under the VAC due to the tissue growing in and filling the tunnels. She continues to use ?Hibiclens and silver alginate to the pannus/groin wounds. ?3/16; patient presents for follow-up. She has been using Hydrofera Blue to the left lateral leg wound. She has been using hydroferra to the pannus/groin ?  wounds. She has no issues or complaints today. ?3/30; patient  presents for follow-up. She has been using Hydrofera Blue and Santyl to the left lateral leg. She has been using Dakin's wet-to-dry dressings to ?the pannus/groin wounds. She denies signs of infection. ?5

## 2022-03-16 NOTE — Progress Notes (Signed)
Yesenia Payne, REPOSA ([id number]) ?Visit Report for 03/16/2022 ?Arrival Information Details ?Patient Name: Date of Service: ?MO Yesenia Payne, [name] 03/16/2022 2:15 PM ?Medical Record Number: [id number] ?Patient Account Number: [id number] ?Date of Birth/Sex: Treating RN: ?1951/02/06 (71 y.o. F) Payne, Yesenia ?Primary Care Mirel Hundal: [name] Other Clinician: ?Referring Eldra Word: ?Treating Clemon Devaul/Extender: Kalman Shan ?[name] ?Weeks in Treatment: 69 ?Visit Information History Since Last Visit ?Added or deleted any medications: No ?Patient Arrived: Stretcher ?Any new allergies or adverse reactions: No ?Arrival Time: 14:55 ?Had a fall or experienced change in No ?Accompanied By: daughter and EMS ?activities of daily living that may affect ?Transfer Assistance: Stretcher ?risk of falls: ?Patient Identification Verified: Yes ?Signs or symptoms of abuse/neglect since last visito No ?Secondary Verification Process Completed: Yes ?Hospitalized since last visit: No ?Patient Requires Transmission-Based Precautions: No ?Implantable device outside of the clinic excluding No ?Patient Has Alerts: No ?cellular tissue based products placed in the center ?since last visit: ?Has Dressing in Place as Prescribed: Yes ?Pain Present Now: No ?Electronic Signature(s) ?Signed: 03/16/2022 5:49:10 PM By: Deon Pilling RN, BSN ?Entered By: Deon Pilling on 03/16/2022 14:56:13 ?-------------------------------------------------------------------------------- ?Encounter Discharge Information Details ?Patient Name: Date of Service: ?MO Yesenia Payne, [name] 03/16/2022 2:15 PM ?Medical Record Number: [id number] ?Patient Account Number: [id number] ?Date of Birth/Sex: Treating RN: ?[birth year] (71 y.o. F) Payne, Yesenia ?Primary Care Dartagnan Beavers: [name] Other Clinician: ?Referring Brigido Mera: ?Treating Gwendoline Judy/Extender: Kalman Shan ?[name] ?Weeks in Treatment: 71 ?Encounter Discharge Information Items Post Procedure  Vitals ?Discharge Condition: Stable ?Temperature (F): 98.6 ?Ambulatory Status: Stretcher ?Pulse (bpm): 94 ?Discharge Destination: Home ?Respiratory Rate (breaths/min): 20 ?Transportation: Ambulance ?Blood Pressure (mmHg): 125/81 ?Accompanied By: daughter ?Schedule Follow-up Appointment: Yes ?Clinical Summary of Care: ?Electronic Signature(s) ?Signed: 03/16/2022 5:49:10 PM By: Deon Pilling RN, BSN ?Entered By: Deon Pilling on 03/16/2022 15:34:51 ?-------------------------------------------------------------------------------- ?Lower Extremity Assessment Details ?Patient Name: ?Date of Service: ?MO Yesenia Payne, [name] 03/16/2022 2:15 PM ?Medical Record Number: [id number] ?Patient Account Number: [id number] ?Date of Birth/Sex: ?Treating RN: ?[birth year] (70 y.o. F) Payne, Yesenia ?Primary Care Johncarlos Holtsclaw: [name] ?Other Clinician: ?Referring Azhia Siefken: ?Treating Amarion Portell/Extender: Kalman Shan ?[name] ?Weeks in Treatment: 65 ?Electronic Signature(s) ?Signed: 03/16/2022 5:49:10 PM By: Deon Pilling RN, BSN ?Entered By: Deon Pilling on 03/16/2022 15:08:57 ?-------------------------------------------------------------------------------- ?Multi Wound Chart Details ?Patient Name: ?Date of Service: ?MO Yesenia Payne, [name] 03/16/2022 2:15 PM ?Medical Record Number: [id number] ?Patient Account Number: [id number] ?Date of Birth/Sex: ?Treating RN: ?09/12/1951 (71 y.o. F) Payne, Yesenia ?Primary Care Rorey Bisson: [name] ?Other Clinician: ?Referring Caelin Rayl: ?Treating Taro Hidrogo/Extender: Kalman Shan ?[name] ?Weeks in Treatment: 25 ?Vital Signs ?Height(in): 63 ?Capillary Blood Glucose(mg/dl): 127 ?Weight(lbs): 279 ?Pulse(bpm): 94 ?Body Mass Index(BMI): 49.4 ?Blood Pressure(mmHg): 125/81 ?Temperature(??F): 98.6 ?Respiratory Rate(breaths/min): 20 ?Photos: ?Right Abdomen - Lower Quadrant Left, Medial Upper Leg Left Abdomen - Lower Quadrant ?Wound Location: ?Gradually Appeared Gradually Appeared Gradually  Appeared ?Wounding Event: ?Pressure Ulcer Pressure Ulcer Pressure Ulcer ?Primary Etiology: ?Cataracts, Glaucoma, Chronic sinus Cataracts, Glaucoma, Chronic sinus Cataracts, Glaucoma, Chronic sinus ?Comorbid History: ?problems/congestion, Asthma, problems/congestion, Asthma, problems/congestion, Asthma, ?Hypertension, Type II Diabetes, Hypertension, Type II Diabetes, Hypertension, Type II Diabetes, ?Osteoarthritis, Neuropathy Osteoarthritis, Neuropathy Osteoarthritis, Neuropathy ?09/14/2021 09/14/2021 10/26/2021 ?Date Acquired: ?26 26 20  ?Weeks of Treatment: ?Open Open Healed - Epithelialized ?Wound Status: ?No No No ?Wound Recurrence: ?0.5x0.5x0.1 7x5x0.1 0x0x0 ?Measurements L x W x D (cm) ?0.196 27.489 0 ?A (cm?) : ?rea ?0.02 2.749 0 ?Volume (cm?) : ?55.50% -23195.80% 100.00% ?% Reduction in A rea: ?77.30% -  11354.20% 100.00% ?% Reduction in Volume: ?Category/Stage III Category/Stage III Category/Stage III ?Classification: ?Medium Large None Present ?Exudate A mount: ?Serosanguineous Serosanguineous N/A ?Exudate Type: ?red, brown red, brown N/A ?Exudate Color: ?Distinct, outline attached Distinct, outline attached Distinct, outline attached ?Wound Margin: ?Large (67-100%) Large (67-100%) None Present (0%) ?Granulation A mount: ?Red, Pink Pink, Pale N/A ?Granulation Quality: ?None Present (0%) None Present (0%) None Present (0%) ?Necrotic Amount: ?Fat Layer (Subcutaneous Tissue): Yes Fat Layer (Subcutaneous Tissue): Yes Fascia: No ?Exposed Structures: ?Fascia: No ?Fascia: No ?Fat Layer (Subcutaneous Tissue): No ?Tendon: No ?Tendon: No ?Tendon: No ?Muscle: No ?Muscle: No ?Muscle: No ?Joint: No ?Joint: No ?Joint: No ?Bone: No ?Bone: No ?Bone: No ?Large (67-100%) Large (67-100%) Large (67-100%) ?Epithelialization: ?N/A N/A N/A ?Debridement: ?N/A N/A N/A ?Pain Control: ?N/A N/A N/A ?Tissue Debrided: ?N/A N/A N/A ?Level: ?N/A N/A N/A ?Debridement A (sq cm): ?rea ?N/A N/A N/A ?Instrument: ?N/A N/A N/A ?Bleeding: ?N/A N/A  N/A ?Hemostasis A chieved: ?N/A N/A N/A ?Procedural Pain: ?N/A N/A N/A ?Post Procedural Pain: ?Debridement Treatment Response: N/A N/A N/A ?Post Debridement Measurements L x N/A N/A N/A ?W x D (cm) ?N/A N/A N/A ?Post Debridement Volume: (cm?) ?N/A N/A N/A ?Procedures Performed: ?Wound Number: [id number]  ?Photos: ?Right Groin Right, Lateral Upper Leg Right, Distal Groin ?Wound Location: ?Gradually Appeared Pressure Injury Gradually Appeared ?Wounding Event: ?Pressure Ulcer Pressure Ulcer Incontinence Associated Dermatitis ?Primary Etiology: ?(IAD) ?Cataracts, Glaucoma, Chronic sinus Cataracts, Glaucoma, Chronic sinus Cataracts, Glaucoma, Chronic sinus ?Comorbid History: ?problems/congestion, Asthma, problems/congestion, Asthma, problems/congestion, Asthma, ?Hypertension, Type II Diabetes, Hypertension, Type II Diabetes, Hypertension, Type II Diabetes, ?Osteoarthritis, Neuropathy Osteoarthritis, Neuropathy Osteoarthritis, Neuropathy ?11/23/2021 12/07/2021 02/04/2022 ?Date A cquired: ?16 13 5  ?Weeks of Treatment: ?Healed - Epithelialized Open Open ?Wound Status: ?No No No ?Wound Recurrence: ?0x0x0 1.4x2.5x0.3 0x0x0 ?Measurements L x W x D (cm) ?0 2.749 0 ?A (cm?) : ?rea ?0 0.825 0 ?Volume (cm?) : ?100.00% -191.80% 100.00% ?% Reduction in A rea: ?100.00% -777.70% 100.00% ?% Reduction in Volume: ?Category/Stage III Category/Stage III Full Thickness Without Exposed ?Classification: ?Support Structures ?None Present Medium None Present ?Exudate A mount: ?N/A Serosanguineous N/A ?Exudate Type: ?N/A red, brown N/A ?Exudate Color: ?Distinct, outline attached Distinct, outline attached Distinct, outline attached ?Wound Margin: ?None Present (0%) Large (67-100%) None Present (0%) ?Granulation A mount: ?N/A Red N/A ?Granulation Quality: ?None Present (0%) Small (1-33%) None Present (0%) ?Necrotic A mount: ?Fascia: No ?Fat Layer (Subcutaneous Tissue): Yes Fascia: No ?Exposed Structures: ?Fat Layer (Subcutaneous Tissue): No Fascia: No  Fat Layer (Subcutaneous Tissue): No ?Tendon: No ?Tendon: No ?Tendon: No ?Muscle: No ?Muscle: No ?Muscle: No ?Joint: No ?Joint: No ?Joint: No ?Bone: No ?Bone: No ?Bone: No ?Large (67-100%) Large (67-100%) Large (67-100%) ?Epithe

## 2022-04-13 ENCOUNTER — Encounter (HOSPITAL_BASED_OUTPATIENT_CLINIC_OR_DEPARTMENT_OTHER): Payer: Medicare Other | Admitting: Internal Medicine

## 2022-04-15 ENCOUNTER — Emergency Department (HOSPITAL_COMMUNITY)
Admission: EM | Admit: 2022-04-15 | Discharge: 2022-04-16 | Disposition: A | Discharge status: Home / Self Care | Payer: Medicare Other | Attending: Emergency Medicine | Admitting: Emergency Medicine

## 2022-04-15 ENCOUNTER — Other Ambulatory Visit: Payer: Self-pay

## 2022-04-15 ENCOUNTER — Encounter (HOSPITAL_COMMUNITY): Payer: Self-pay | Admitting: Emergency Medicine

## 2022-04-15 DIAGNOSIS — I4891 Unspecified atrial fibrillation: Secondary | ICD-10-CM

## 2022-04-15 DIAGNOSIS — Z794 Long term (current) use of insulin: Secondary | ICD-10-CM | POA: Insufficient documentation

## 2022-04-15 DIAGNOSIS — I10 Essential (primary) hypertension: Secondary | ICD-10-CM | POA: Diagnosis not present

## 2022-04-15 DIAGNOSIS — N390 Urinary tract infection, site not specified: Secondary | ICD-10-CM

## 2022-04-15 DIAGNOSIS — D72829 Elevated white blood cell count, unspecified: Secondary | ICD-10-CM | POA: Diagnosis not present

## 2022-04-15 DIAGNOSIS — R531 Weakness: Secondary | ICD-10-CM | POA: Diagnosis present

## 2022-04-15 LAB — COMPREHENSIVE METABOLIC PANEL
ALT: 15 U/L (ref 0–44)
AST: 29 U/L (ref 15–41)
Albumin: 1.7 g/dL — ABNORMAL LOW (ref 3.5–5.0)
Alkaline Phosphatase: 109 U/L (ref 38–126)
Anion gap: 9 (ref 5–15)
BUN: 10 mg/dL (ref 8–23)
CO2: 25 mmol/L (ref 22–32)
Calcium: 6.9 mg/dL — ABNORMAL LOW (ref 8.9–10.3)
Chloride: 104 mmol/L (ref 98–111)
Creatinine, Ser: 0.54 mg/dL (ref 0.44–1.00)
GFR, Estimated: >60 mL/min (ref >60)
Glucose, Bld: 126 mg/dL — ABNORMAL HIGH (ref 70–99)
Potassium: 3.1 mmol/L — ABNORMAL LOW (ref 3.5–5.1)
Sodium: 138 mmol/L (ref 135–145)
Total Bilirubin: 0.9 mg/dL (ref 0.3–1.2)
Total Protein: 5.7 g/dL — ABNORMAL LOW (ref 6.5–8.1)

## 2022-04-15 LAB — CBC WITH DIFFERENTIAL/PLATELET
Abs Immature Granulocytes: 0.1 10*3/uL — ABNORMAL HIGH (ref 0.00–0.07)
Basophils Absolute: 0.1 10*3/uL (ref 0.0–0.1)
Basophils Relative: 1 %
Eosinophils Absolute: 0.1 10*3/uL (ref 0.0–0.5)
Eosinophils Relative: 1 %
HCT: 42.8 % (ref 36.0–46.0)
Hemoglobin: 14.4 g/dL (ref 12.0–15.0)
Immature Granulocytes: 1 %
Lymphocytes Relative: 16 %
Lymphs Abs: 2.1 10*3/uL (ref 0.7–4.0)
MCH: 30.8 pg (ref 26.0–34.0)
MCHC: 33.6 g/dL (ref 30.0–36.0)
MCV: 91.6 fL (ref 80.0–100.0)
Monocytes Absolute: 0.5 10*3/uL (ref 0.1–1.0)
Monocytes Relative: 4 %
Neutro Abs: 10.8 10*3/uL — ABNORMAL HIGH (ref 1.7–7.7)
Neutrophils Relative %: 77 %
Platelets: 329 10*3/uL (ref 150–400)
RBC: 4.67 MIL/uL (ref 3.87–5.11)
RDW: 15.1 % (ref 11.5–15.5)
WBC: 13.8 10*3/uL — ABNORMAL HIGH (ref 4.0–10.5)
nRBC: 0 % (ref 0.0–0.2)

## 2022-04-15 MED ORDER — SODIUM CHLORIDE 0.9 % IV BOLUS
1000.0000 mL | Freq: Once | INTRAVENOUS | Status: AC
  Administered 2022-04-15: 1000 mL via INTRAVENOUS

## 2022-04-15 NOTE — Discharge Instructions (Addendum)
As discussed, today's evaluation has been generally reassuring.  In addition to following up with your physician, please follow-up with our atrial fibrillation clinic.  You will be contacted in the coming days to establish this appointment.  Return here for concerning changes in your condition.

## 2022-04-15 NOTE — ED Provider Notes (Signed)
[facility]-EMERGENCY DEPT Provider Note   CSN: [id number] Arrival date & time: 04/15/22  1847     History  Chief Complaint  Patient presents with   Dehydration    Yesenia Payne is a 71 y.o. female.  HPI Patient presents at the behest of her physician who felt as though the patient was more weak in appearance than usual.  Patient states that she feels good, notes that she has completed 2 courses of antibiotics for urinary tract infection over the past few weeks, but otherwise has been doing okay.  She does have multiple medical problems, and had amputation of her left leg last year, but states that in spite of that she has been doing well. She denies specific pain, weakness, dyspnea.  Per physician report there are some consideration of urinary tract infection recurrent, and dehydration.    Home Medications Prior to Admission medications   Medication Sig Start Date End Date Taking? Authorizing Provider  albuterol (PROVENTIL) (2.5 MG/3ML) 0.083% nebulizer solution Take 2.5 mg by nebulization 3 (three) times daily as needed. For shortness of breath.    [provider]  cephALEXin (KEFLEX) 500 MG capsule Take 1 capsule (500 mg total) by mouth 3 (three) times daily. 03/27/21   Charlynne Pander, MD  Cholecalciferol (DIALYVITE VITAMIN D 5000 PO) Take 10,000 Units by mouth daily.    [provider]  collagenase (SANTYL) ointment Apply topically daily. 10/15/20   Rhetta Mura, MD  desloratadine (CLARINEX) 5 MG tablet Take 5 mg by mouth daily as needed (allergies).    [provider]  docusate sodium (COLACE) 100 MG capsule Take 100 mg by mouth 2 (two) times daily.    [provider]  dorzolamide (TRUSOPT) 2 % ophthalmic solution Place 1 drop into both eyes in the morning and at bedtime.    [provider]  fluticasone (FLONASE) 50 MCG/ACT nasal spray Place 1 spray into both nostrils 2 (two) times daily as needed for  allergies.    [provider]  Fluticasone-Salmeterol (ADVAIR) 250-50 MCG/DOSE AEPB Inhale 1 puff into the lungs daily.    [provider]  furosemide (LASIX) 20 MG tablet Take 40 mg by mouth 2 (two) times daily.    [provider]  gabapentin (NEURONTIN) 300 MG capsule Take 1 capsule (300 mg total) by mouth 3 (three) times daily. 10/14/20   Rhetta Mura, MD  insulin glargine (LANTUS) 100 UNIT/ML injection Inject 0.3 mLs (30 Units total) into the skin daily. Patient taking differently: Inject 30 Units into the skin at bedtime. 10/14/20   Rhetta Mura, MD  levofloxacin (LEVAQUIN) 500 MG tablet Take 1 tablet (500 mg total) by mouth daily. 01/02/21   [name], MD  Magnesium Oxide 420 MG TABS Take 420 mg by mouth daily.    [provider]  methocarbamol (ROBAXIN) 500 MG tablet Take 1 tablet (500 mg total) by mouth every 6 (six) hours as needed for muscle spasms. 04/03/21   Persons, [place], [place]  methocarbamol (ROBAXIN) 500 MG tablet Take 1 tablet (500 mg total) by mouth every 8 (eight) hours as needed for muscle spasms. 04/07/21   [name], MD  nystatin (MYCOSTATIN/NYSTOP) powder Apply 1 application topically 3 (three) times daily. 12/10/20   Persons, [place], [place]  omeprazole (PRILOSEC) 20 MG capsule Take 20 mg by mouth 2 (two) times daily before a meal.    [provider]  oxyCODONE ER (XTAMPZA ER) 9 MG C12A Take 9  mg by mouth every 12 (twelve) hours as needed. Patient taking differently: Take 9 mg by mouth in the morning and at bedtime. 10/21/20   Persons, [place], [place]  oxyCODONE-acetaminophen (PERCOCET/ROXICET) 5-325 MG tablet Take 1 tablet by mouth every 4 (four) hours as needed for moderate pain. 10/11/20   [name], MD  potassium chloride (KLOR-CON) 10 MEQ tablet Take 10 mEq by mouth daily. 08/20/15   [provider]  SUMAtriptan (IMITREX) 25 MG tablet Take 25 mg by mouth daily as needed for migraine.    [provider]      Allergies    Iodinated contrast media and Metrizamide    Review of Systems   Review of Systems  All other systems reviewed and are negative.   Physical Exam Updated Vital Signs BP 112/78   Pulse 64   Temp 98.7 F (37.1 C) (Oral)   Resp 13   SpO2 100%  Physical Exam Vitals and nursing note reviewed.  Constitutional:      General: She is not in acute distress.    Appearance: She is well-developed. She is obese. She is not ill-appearing, toxic-appearing or diaphoretic.  HENT:     Head: Normocephalic and atraumatic.  Eyes:     Conjunctiva/sclera: Conjunctivae normal.  Cardiovascular:     Rate and Rhythm: Normal rate and regular rhythm.  Pulmonary:     Effort: Pulmonary effort is normal. No respiratory distress.     Breath sounds: Normal breath sounds. No stridor.  Abdominal:     General: There is no distension.  Musculoskeletal:     Comments: Left leg status post amputation  Skin:    General: Skin is warm and dry.  Neurological:     Mental Status: She is alert and oriented to person, place, and time.     Cranial Nerves: No cranial nerve deficit.  Psychiatric:        Mood and Affect: Mood normal.     ED Results / Procedures / Treatments   Labs (all labs ordered are listed, but only abnormal results are displayed) Labs Reviewed  COMPREHENSIVE METABOLIC PANEL - Abnormal; Notable for the following components:      Result Value   Potassium 3.1 (*)    Glucose, Bld 126 (*)    Calcium 6.9 (*)    Total Protein 5.7 (*)    Albumin 1.7 (*)    All other components within normal limits  CBC WITH DIFFERENTIAL/PLATELET - Abnormal; Notable for the following components:   WBC 13.8 (*)    Neutro Abs 10.8 (*)    Abs Immature Granulocytes 0.10 (*)    All other components within normal limits  URINALYSIS, ROUTINE W REFLEX MICROSCOPIC    EKG EKG Interpretation  Date/Time:  Thursday April 15 2022 19:55:57 EDT Ventricular Rate:  112 PR Interval:    QRS  Duration: 93 QT Interval:  353 QTC Calculation: 482 R Axis:   19 Text Interpretation: Atrial fibrillation Paired ventricular premature complexes Anterior infarct, old Borderline repolarization abnormality 12 Lead; Mason-Likar Abnormal ECG Confirmed by Gerhard Munch [phone]) on 04/15/2022 11:26:15 PM    Procedures Procedures    Medications Ordered in ED Medications  sodium chloride 0.9 % bolus 1,000 mL (0 mLs Intravenous Stopped 04/15/22 2307)    ED Course/ Medical Decision Making/ A&P This patient with a Hx of multiple medical issues including hypertension, obesity, prior episode of sepsis presents to the ED for concern of possible weakness, this involves an extensive number of treatment  options, and is a complaint that carries with it a high risk of complications and morbidity.    The differential diagnosis includes bacteremia, sepsis, infection, urinary tract disease, dehydration   Social Determinants of Health:  Obesity, age  Additional history obtained:  Additional history and/or information obtained from chart review and physician notes, notable for suggestion of possible recurrent urinary tract infection given her history   After the initial evaluation, orders, including: Labs urine ECG were initiated.   Patient placed on Cardiac and Pulse-Oximetry Monitors. The patient was maintained on a cardiac monitor.  The cardiac monitored showed an rhythm of 60 sinus normal Patient has had some episodes of A-fib, rate 90s, 100 The patient was also maintained on pulse oximetry. The readings were typically 100% room air normal   On repeat evaluation of the patient stayed the same 11:29 PM Patient awake, alert, company by female companion.  We discussed all findings. Lab Tests:  I personally interpreted labs.  The pertinent results include: Mild leukocytosis   Dispostion / Final MDM:  After consideration of the diagnostic results and the patient's response to treatment, she  remains awake, alert, smiling, pleasantly interactive throughout hours of monitoring.  This adult female is presenting with physician concern for weakness, possible urinary tract infection in the context of 2 recent courses of antibiotics.  Patient is afebrile, has mild leukocytosis, but no evidence for bacteremia, sepsis.  At signout she is awaiting urinalysis results likely he is appropriate to go home regardless, with appropriate antibiotics as needed.  Patient's other findings were notable for A-fib on monitor, EKG, intermittently.  At bedside I reviewed the patient's chart with her and her family member.  She has been diagnosed with A-fib as long ago as 18 months prior to this visit.  She is not currently taking any anticoagulation medicine, states that she is comfortable with following up with cardiology clinic, and a referral has been placed for further consideration of intermittent A-fib with those individuals in the clinic.  Final Clinical Impression(s) / ED Diagnoses Final diagnoses:  Weakness     Gerhard MunchLockwood, Cherri Yera, MD 04/15/22 2329

## 2022-04-15 NOTE — ED Triage Notes (Signed)
This AM the patient's MD noticed her urine was very dark and believed she did not look well. They wanted her to come to the hospital hydration and to assess for UTI. She had an UTI 6 weeks ago and took 2 courses of antibiotics.   HX Left foot amputation  EMS vitals: 105 CBG 76 HR 96% SPO2 on room air 98.1 Temp 108 Palpated BP

## 2022-04-16 LAB — URINALYSIS, ROUTINE W REFLEX MICROSCOPIC
Bilirubin Urine: NEGATIVE
Glucose, UA: NEGATIVE mg/dL
Ketones, ur: NEGATIVE mg/dL
Leukocytes,Ua: NEGATIVE
Nitrite: NEGATIVE
Protein, ur: NEGATIVE mg/dL
Specific Gravity, Urine: 1.011 (ref 1.005–1.030)
pH: 5 (ref 5.0–8.0)

## 2022-04-16 MED ORDER — CEPHALEXIN 500 MG PO CAPS
500.0000 mg | ORAL_CAPSULE | Freq: Once | ORAL | Status: AC
Start: 2022-04-16 — End: 2022-04-16
  Administered 2022-04-16: 500 mg via ORAL
  Filled 2022-04-16: qty 1

## 2022-04-16 MED ORDER — CEPHALEXIN 500 MG PO CAPS: 500.0000 mg | ORAL_CAPSULE | Freq: Two times a day (BID) | ORAL | 0 refills | Status: AC

## 2022-04-16 NOTE — ED Notes (Signed)
[facility] called for transport.  

## 2022-04-26 ENCOUNTER — Encounter (HOSPITAL_BASED_OUTPATIENT_CLINIC_OR_DEPARTMENT_OTHER): Payer: Medicare Other | Admitting: Internal Medicine

## 2022-04-29 ENCOUNTER — Encounter (HOSPITAL_BASED_OUTPATIENT_CLINIC_OR_DEPARTMENT_OTHER): Payer: Medicare Other | Attending: Internal Medicine | Admitting: Internal Medicine

## 2022-04-29 DIAGNOSIS — Z89512 Acquired absence of left leg below knee: Secondary | ICD-10-CM | POA: Diagnosis not present

## 2022-04-29 DIAGNOSIS — S31109D Unspecified open wound of abdominal wall, unspecified quadrant without penetration into peritoneal cavity, subsequent encounter: Secondary | ICD-10-CM | POA: Diagnosis not present

## 2022-04-29 DIAGNOSIS — E11622 Type 2 diabetes mellitus with other skin ulcer: Secondary | ICD-10-CM | POA: Insufficient documentation

## 2022-04-29 DIAGNOSIS — L97829 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ity: Secondary | ICD-10-CM | POA: Diagnosis not present

## 2022-04-29 DIAGNOSIS — L97819 Non-pressure chronic ulcer of other part of right lower leg with unspecified severity: Secondary | ICD-10-CM | POA: Diagnosis not present

## 2022-04-29 DIAGNOSIS — M797 Fibromyalgia: Secondary | ICD-10-CM | POA: Insufficient documentation

## 2022-04-29 DIAGNOSIS — X58XXXD Exposure to other specified factors, subsequent encounter: Secondary | ICD-10-CM | POA: Diagnosis not present

## 2022-04-29 NOTE — Progress Notes (Addendum)
SILVA, AAMODT ([id number]) Visit Report for 04/29/2022 Arrival Information Details Patient Name: Date of Service: [place] Yesenia Payne, [name] 04/29/2022 1:30 PM Medical Record Number: [id number] Patient Account Number: [id number] Date of Birth/Sex: Treating RN: 08-25-1951 (71 y.o. [name], Tammi Klippel Primary Care Jaremy Nosal: [name] Other Clinician: Referring Naryah Clenney: Treating Keiera Strathman/Extender: Alysia Penna in Treatment: 63 Visit Information History Since Last Visit Added or deleted any medications: No Patient Arrived: Stretcher Any new allergies or adverse reactions: No Arrival Time: 13:25 Had a fall or experienced change in No Accompanied By: daughter and EMS activities of daily living that may affect Transfer Assistance: Stretcher risk of falls: Patient Identification Verified: Yes Signs or symptoms of abuse/neglect since last visito No Secondary Verification Process Completed: Yes Hospitalized since last visit: No Patient Requires Transmission-Based Precautions: No Implantable device outside of the clinic excluding No Patient Has Alerts: No cellular tissue based products placed in the center since last visit: Has Dressing in Place as Prescribed: Yes Pain Present Now: Yes Electronic Signature(s) Signed: 04/29/2022 5:10:40 PM By: Deon Pilling RN, BSN Entered By: Deon Pilling on 04/29/2022 13:29:22 -------------------------------------------------------------------------------- Encounter Discharge Information Details Patient Name: Date of Service: [place] Yesenia [place], [name] 04/29/2022 1:30 PM Medical Record Number: [id number] Patient Account Number: [id number] Date of Birth/Sex: Treating RN: [birth year] (71 y.o. [name], Tammi Klippel Primary Care Rini Moffit: [name] Other Clinician: Referring Eligh Rybacki: Treating Nick Armel/Extender: Alysia Penna in Treatment: 61 Encounter Discharge Information Items Post Procedure  Vitals Discharge Condition: Stable Temperature (F): 98.4 Ambulatory Status: Stretcher Pulse (bpm): 63 Discharge Destination: Home Respiratory Rate (breaths/min): 20 Transportation: Ambulance Blood Pressure (mmHg): 100/67 Accompanied By: daughter Schedule Follow-up Appointment: Yes Clinical Summary of Care: Electronic Signature(s) Signed: 04/29/2022 5:10:40 PM By: Deon Pilling RN, BSN Entered By: Deon Pilling on 04/29/2022 14:39:37 -------------------------------------------------------------------------------- Lower Extremity Assessment Details Patient Name: Date of Service: [place] Yesenia San, [name] 04/29/2022 1:30 PM Medical Record Number: [id number] Patient Account Number: [id number] Date of Birth/Sex: Treating RN: September 11, 1951 (71 y.o. [name] Primary Care Kodey Xue: [name] Other Clinician: Referring Mirai Greenwood: Treating Niurka Benecke/Extender: Alysia Penna in Treatment: 59 Electronic Signature(s) Signed: 04/29/2022 5:10:40 PM By: Deon Pilling RN, BSN Entered By: Deon Pilling on 04/29/2022 13:29:36 -------------------------------------------------------------------------------- Multi Wound Chart Details Patient Name: Date of Service: [place] Yesenia San, [name] 04/29/2022 1:30 PM Medical Record Number: [id number] Patient Account Number: [id number] Date of Birth/Sex: Treating RN: Jan 21, 1951 (71 y.o. [name] Primary Care Bilbo Carcamo: [name] Other Clinician: Referring Ailie Gage: Treating Teckla Christiansen/Extender: Alysia Penna in Treatment: 58 Vital Signs Height(in): 63 Capillary Blood Glucose(mg/dl): 164 Weight(lbs): 279 Pulse(bpm): 37 Body Mass Index(BMI): 49.4 Blood Pressure(mmHg): 100/67 Temperature(F): 98.4 Respiratory Rate(breaths/min): 16 Photos: [10:No Photos Right Abdomen - Lower Quadrant] [11:No Photos Left, Medial Upper Leg] [14:No Photos Right, Lateral Upper Leg] Wound Location: [10:Gradually  Appeared] [11:Gradually Appeared] [14:Pressure Injury] Wounding Event: [10:Pressure Ulcer] [11:Pressure Ulcer] [14:Pressure Ulcer] Primary Etiology: [10:Cataracts, Glaucoma, Chronic sinus N/A] [14:Cataracts, Glaucoma, Chronic sinus] Comorbid History: [10:problems/congestion, Asthma, Hypertension, Type II Diabetes, Osteoarthritis, Neuropathy 09/14/2021] [11:09/14/2021] [14:problems/congestion, Asthma, Hypertension, Type II Diabetes, Osteoarthritis, Neuropathy 12/07/2021] Date Acquired: [10:32] [11:32] [14:19] Weeks of Treatment: [10:Open] [11:Open] [14:Open] Wound Status: [10:No] [11:No] [14:No] Wound Recurrence: [10:0.5x1.2x0.3] [11:0x0x0] [14:0.5x0.3x0.1] Measurements L x W x D (cm) [10:0.471] [11:0] [14:0.118] A (cm) : rea [10:0.141] [11:0] [14:0.012] Volume (cm) : [10:-7.00%] [11:100.00%] [14:87.50%] % Reduction in A rea: [10:-60.20%] [11:100.00%] [14:87.20%] % Reduction in Volume: [10:No] [11:N/A] [  14:No] Undermining: [10:Category/Stage III] [11:Category/Stage III] [14:Category/Stage III] Classification: [10:Medium] [11:Large] [14:Medium] Exudate A mount: [10:Serosanguineous] [11:Serosanguineous] [14:Serosanguineous] Exudate Type: [10:red, brown] [11:red, brown] [14:red, brown] Exudate Color: [10:Distinct, outline attached] [11:N/A] [14:Distinct, outline attached] Wound Margin: [10:Large (67-100%)] [11:N/A] [14:Large (67-100%)] Granulation A mount: [10:Red, Pink] [11:N/A] [14:Red] Granulation Quality: [10:None Present (0%)] [11:N/A] [14:Small (1-33%)] Necrotic A mount: [10:Fat Layer (Subcutaneous Tissue): Yes N/A] [14:Fat Layer (Subcutaneous Tissue): Yes] Exposed Structures: [10:Fascia: No Tendon: No Muscle: No Joint: No Bone: No Large (67-100%)] [11:N/A] [14:Fascia: No Tendon: No Muscle: No Joint: No Bone: No Large (67-100%)] Epithelialization: [10:N/A] [11:N/A] [14:N/A] Debridement: [10:N/A] [11:N/A] [14:N/A] Pain Control: [10:N/A] [11:N/A] [14:N/A] Tissue Debrided: [10:N/A]  [11:N/A] [14:N/A] Level: [10:N/A] [11:N/A] [14:N/A] Debridement A (sq cm): [10:rea N/A] [11:N/A] [14:N/A] Instrument: [10:N/A] [11:N/A] [14:N/A] Bleeding: [10:N/A] [11:N/A] [14:N/A] Hemostasis A chieved: [10:N/A] [11:N/A] [14:N/A] Procedural Pain: [10:N/A] [11:N/A] [14:N/A] Post Procedural Pain: [10:N/A] [11:N/A] [14:N/A] Debridement Treatment Response: [10:N/A] [11:N/A] [14:N/A] Post Debridement Measurements L x W x D (cm) [10:N/A] [11:N/A] [14:N/A] Post Debridement Volume: (cm) [10:N/A] [11:N/A] [14:N/A] Wound Number: [id number]  Photos: No Photos No Photos No Photos Left, Anterior Upper Leg Right, Anterior Upper Leg Left, Lateral Upper Leg Wound Location: Gradually Appeared Gradually Appeared Gradually Appeared Wounding Event: MASD MASD Diabetic Wound/Ulcer of the Lower Primary Etiology: Extremity Cataracts, Glaucoma, Chronic sinus Cataracts, Glaucoma, Chronic sinus Cataracts, Glaucoma, Chronic sinus Comorbid History: problems/congestion, Asthma, problems/congestion, Asthma, problems/congestion, Asthma, Hypertension, Type II Diabetes, Hypertension, Type II Diabetes, Hypertension, Type II Diabetes, Osteoarthritis, Neuropathy Osteoarthritis, Neuropathy Osteoarthritis, Neuropathy 04/29/2022 04/29/2022 01/06/2021 Date Acquired: 0 0 52 Weeks of Treatment: Open Open Open Wound Status: No No No Wound Recurrence: 1.2x0.6x0.7 1.3x1.1x3.5 11.2x3.8x0.8 Measurements L x W x D (cm) 0.565 1.123 33.427 A (cm) : rea 0.396 3.931 26.741 Volume (cm) : N/A N/A 70.40% % Reduction in A rea: N/A N/A 90.50% % Reduction in Volume: 6 7 Starting Position 1 (o'clock): 12 9 Ending Position 1 (o'clock): 1.5 0.8 Maximum Distance 1 (cm): Yes Yes No Undermining: Full Thickness With Exposed Support Full Thickness With Exposed Support Grade 2 Classification: Structures Structures Medium Medium Medium Exudate A mount: Serosanguineous Serosanguineous Serosanguineous Exudate Type: red, brown red,  brown red, brown Exudate Color: Distinct, outline attached Distinct, outline attached Distinct, outline attached Wound Margin: Medium (34-66%) Medium (34-66%) Small (1-33%) Granulation A mount: Red, Pink Red, Pink Red, Pink, Pale Granulation Quality: Medium (34-66%) Medium (34-66%) Large (67-100%) Necrotic A mount: Fat Layer (Subcutaneous Tissue): Yes Fat Layer (Subcutaneous Tissue): Yes Fat Layer (Subcutaneous Tissue): Yes Exposed Structures: Fascia: No Fascia: No Fascia: No Tendon: No Tendon: No Tendon: No Muscle: No Muscle: No Muscle: No Joint: No Joint: No Joint: No Bone: No Bone: No Bone: No N/A None Medium (34-66%) Epithelialization: Chemical/Enzymatic/Mechanical Chemical/Enzymatic/Mechanical Debridement - Excisional Debridement: Pre-procedure Verification/Time Out N/A N/A 14:10 Taken: N/A N/A Lidocaine 4% Topical Solution Pain Control: N/A N/A Subcutaneous, Slough Tissue Debrided: N/A N/A Skin/Subcutaneous Tissue Level: N/A N/A 42.56 Debridement A (sq cm): rea N/A N/A Curette Instrument: None None Minimum Bleeding: N/A N/A Pressure Hemostasis A chieved: N/A N/A 0 Procedural Pain: N/A N/A 0 Post Procedural Pain: Procedure was tolerated well Procedure was tolerated well Procedure was tolerated well Debridement Treatment Response: 1.2x0.6x0.7 1.3x1.1x3.5 11.2x3.8x0.8 Post Debridement Measurements L x W x D (cm) 0.396 3.931 26.741 Post Debridement Volume: (cm) Debridement Debridement Debridement Procedures Performed: Treatment Notes Wound #10 (Abdomen - Lower Quadrant) Wound Laterality: Right Cleanser Soap and Water Discharge Instruction: May shower and wash wound with dial antibacterial soap and water prior  to dressing change. Peri-Wound Care Ketoconazole Cream 2% Discharge Instruction: mixed with zinc in office. Zinc Oxide Ointment 30g tube Discharge Instruction: Apply Zinc Oxide to periwound with each dressing change over the counter  antifungal cream Discharge Instruction: mix with zinc and apply to skin folds. Topical Primary Dressing Secondary Dressing ABD Pad, 5x9 Discharge Instruction: Apply over primary dressing as directed. Woven Gauze Sponge, Non-Sterile 4x4 in Discharge Instruction: Apply over primary dressing as directed. Secured With 50M Medipore H Soft Cloth Surgical T ape, 4 x 10 (in/yd) Discharge Instruction: Secure with tape as directed. Compression Wrap Compression Stockings Add-Ons Wound #11 (Upper Leg) Wound Laterality: Left, Medial Cleanser Peri-Wound Care Topical Primary Dressing Secondary Dressing Secured With Compression Wrap Compression Stockings Add-Ons Wound #14 (Upper Leg) Wound Laterality: Right, Lateral Cleanser Soap and Water Discharge Instruction: May shower and wash wound with dial antibacterial soap and water prior to dressing change. Peri-Wound Care Ketoconazole Cream 2% Discharge Instruction: mixed with zinc in office. Zinc Oxide Ointment 30g tube Discharge Instruction: Apply Zinc Oxide to periwound with each dressing change over the counter antifungal cream Discharge Instruction: mix with zinc and apply to skin folds. Topical Primary Dressing Secondary Dressing ABD Pad, 5x9 Discharge Instruction: Apply over primary dressing as directed. Woven Gauze Sponge, Non-Sterile 4x4 in Discharge Instruction: Apply over primary dressing as directed. Secured With 50M Medipore H Soft Cloth Surgical T ape, 4 x 10 (in/yd) Discharge Instruction: Secure with tape as directed. Compression Wrap Compression Stockings Add-Ons Wound #16 (Upper Leg) Wound Laterality: Left, Anterior Cleanser Soap and Water Discharge Instruction: May shower and wash wound with dial antibacterial soap and water prior to dressing change. Wound Cleanser Discharge Instruction: Cleanse the wound with wound cleanser prior to applying a clean dressing using gauze sponges, not tissue or cotton  balls. Peri-Wound Care Topical Topical compounding antibiotics from [facility] Discharge Instruction: mix and apply daily to wound bed once arrives in place of Dakin's. Primary Dressing Dakin's Solution 0.25%, 16 (oz) Discharge Instruction: Moisten gauze with Dakin's solution Secondary Dressing ABD Pad, 5x9 Discharge Instruction: Apply over primary dressing as directed. Woven Gauze Sponge, Non-Sterile 4x4 in Discharge Instruction: Apply over primary dressing as directed. Secured With 50M Medipore H Soft Cloth Surgical T ape, 4 x 10 (in/yd) Discharge Instruction: Secure with tape as directed. Compression Wrap Compression Stockings Add-Ons Wound #17 (Upper Leg) Wound Laterality: Right, Anterior Cleanser Soap and Water Discharge Instruction: May shower and wash wound with dial antibacterial soap and water prior to dressing change. Wound Cleanser Discharge Instruction: Cleanse the wound with wound cleanser prior to applying a clean dressing using gauze sponges, not tissue or cotton balls. Peri-Wound Care Topical Topical compounding antibiotics from [facility] Discharge Instruction: mix and apply daily to wound bed once arrives in place of Dakin's. Primary Dressing Dakin's Solution 0.25%, 16 (oz) Discharge Instruction: Moisten gauze with Dakin's solution Secondary Dressing ABD Pad, 5x9 Discharge Instruction: Apply over primary dressing as directed. Woven Gauze Sponge, Non-Sterile 4x4 in Discharge Instruction: Apply over primary dressing as directed. Secured With 50M Medipore H Soft Cloth Surgical T ape, 4 x 10 (in/yd) Discharge Instruction: Secure with tape as directed. Compression Wrap Compression Stockings Add-Ons Wound #8 (Upper Leg) Wound Laterality: Left, Lateral Cleanser Normal Saline Discharge Instruction: Cleanse the wound with Normal Saline prior to applying a clean dressing using gauze sponges, not tissue or cotton balls. Soap and Water Discharge  Instruction: May shower and wash wound with dial antibacterial soap and water prior to dressing change. Peri-Wound Care Topical  Topical antibiotics from [facility] Discharge Instruction: apply under the iodosorb daily Primary Dressing Iodosorb Gel 10 (gm) Tube Discharge Instruction: Apply to wound bed as instructed Secondary Dressing Woven Gauze Sponge, Non-Sterile 4x4 in Discharge Instruction: Apply over primary dressing as directed. Zetuvit Plus 4x8 in Discharge Instruction: Apply over primary dressing as directed. Secured With 64M Medipore H Soft Cloth Surgical T 4 x 2 (in/yd) ape Discharge Instruction: Secure dressing with tape as directed. Compression Wrap Compression Stockings Add-Ons Electronic Signature(s) Signed: 04/29/2022 4:03:56 PM By: Kalman Shan DO Signed: 04/29/2022 5:10:40 PM By: Deon Pilling RN, BSN Entered By: Kalman Shan on 04/29/2022 15:09:39 -------------------------------------------------------------------------------- Multi-Disciplinary Care Plan Details Patient Name: Date of Service: [place] Yesenia San, [name] 04/29/2022 1:30 PM Medical Record Number: [id number] Patient Account Number: [id number] Date of Birth/Sex: Treating RN: Aug 30, 1951 (71 y.o. [name] Primary Care Raja Caputi: [name] Other Clinician: Referring Madelaine Whipple: Treating Jeyden Coffelt/Extender: Alysia Penna in Treatment: 6 Active Inactive Electronic Signature(s) Signed: 08/04/2022 5:55:14 PM By: Deon Pilling RN, BSN Previous Signature: 05/21/2022 12:22:19 PM Version By: Deon Pilling RN, BSN Previous Signature: 04/29/2022 5:10:40 PM Version By: Deon Pilling RN, BSN Entered By: Deon Pilling on 05/21/2022 12:25:13 -------------------------------------------------------------------------------- Pain Assessment Details Patient Name: Date of Service: [place] Yesenia San, [name] 04/29/2022 1:30 PM Medical Record Number: [id number] Patient Account  Number: [id number] Date of Birth/Sex: Treating RN: Aug 13, 1951 (71 y.o. [name] Primary Care Shemeca Lukasik: [name] Other Clinician: Referring Brailynn Breth: Treating Haruko Mersch/Extender: Alysia Penna in Treatment: 68 Active Problems Location of Pain Severity and Description of Pain Patient Has Paino Yes Site Locations Rate the pain. Current Pain Level: 4 Pain Management and Medication Current Pain Management: Medication: No Cold Application: No Rest: No Massage: No Activity: No T.E.N.S.: No Heat Application: No Leg drop or elevation: No Is the Current Pain Management Adequate: Adequate How does your wound impact your activities of daily livingo Sleep: No Bathing: No Appetite: No Relationship With Others: No Bladder Continence: No Emotions: No Bowel Continence: No Work: No Toileting: No Drive: No Dressing: No Hobbies: No Engineer, maintenance) Signed: 04/29/2022 5:10:40 PM By: Deon Pilling RN, BSN Entered By: Deon Pilling on 04/29/2022 13:29:32 -------------------------------------------------------------------------------- Patient/Caregiver Education Details Patient Name: Date of Service: [place] Yesenia San, [name] 6/22/2023andnbsp1:30 PM Medical Record Number: [id number] Patient Account Number: [id number] Date of Birth/Gender: Treating RN: 10/20/1951 (71 y.o. [name] Primary Care Physician: [name] Other Clinician: Referring Physician: Treating Physician/Extender: Alysia Penna in Treatment: 47 Education Assessment Education Provided To: Patient Education Topics Provided Wound/Skin Impairment: Handouts: Skin Care Do's and Dont's Methods: Explain/Verbal Responses: Reinforcements needed Electronic Signature(s) Signed: 04/29/2022 5:10:40 PM By: Deon Pilling RN, BSN Entered By: Deon Pilling on 04/29/2022  13:48:54 -------------------------------------------------------------------------------- Wound Assessment Details Patient Name: Date of Service: [place] Yesenia San, [name] 04/29/2022 1:30 PM Medical Record Number: [id number] Patient Account Number: [id number] Date of Birth/Sex: Treating RN: 10/04/1951 (71 y.o. [name], Tammi Klippel Primary Care Anique Beckley: [name] Other Clinician: Referring Dequavious Harshberger: Treating Maki Sweetser/Extender: Alysia Penna in Treatment: 2 Wound Status Wound Number: 10 Primary Pressure Ulcer Etiology: Wound Location: Right Abdomen - Lower Quadrant Wound Open Wounding Event: Gradually Appeared Status: Date Acquired: 09/14/2021 Comorbid Cataracts, Glaucoma, Chronic sinus problems/congestion, Asthma, Weeks Of Treatment: 32 History: Hypertension, Type II Diabetes, Osteoarthritis, Neuropathy Clustered Wound: No Photos Photo Uploaded By: Donavan Burnet on 04/29/2022 18:09:53 Wound Measurements Length: (cm) 0.5 Width: (cm) 1.2 Depth: (cm)  0.3 Area: (cm) 0.471 Volume: (cm) 0.141 % Reduction in Area: -7% % Reduction in Volume: -60.2% Epithelialization: Large (67-100%) Tunneling: No Undermining: No Wound Description Classification: Category/Stage III Wound Margin: Distinct, outline attached Exudate Amount: Medium Exudate Type: Serosanguineous Exudate Color: red, brown Foul Odor After Cleansing: No Slough/Fibrino No Wound Bed Granulation Amount: Large (67-100%) Exposed Structure Granulation Quality: Red, Pink Fascia Exposed: No Necrotic Amount: None Present (0%) Fat Layer (Subcutaneous Tissue) Exposed: Yes Tendon Exposed: No Muscle Exposed: No Joint Exposed: No Bone Exposed: No Electronic Signature(s) Signed: 04/29/2022 5:10:40 PM By: Deon Pilling RN, BSN Entered By: Deon Pilling on 04/29/2022 13:37:47 -------------------------------------------------------------------------------- Wound Assessment Details Patient  Name: Date of Service: [place] Yesenia San, [name] 04/29/2022 1:30 PM Medical Record Number: [id number] Patient Account Number: [id number] Date of Birth/Sex: Treating RN: 09-12-1951 (71 y.o. [name] Primary Care Deolinda Frid: [name] Other Clinician: Referring Chanika Byland: Treating Jaclyne Haverstick/Extender: Alysia Penna in Treatment: 83 Wound Status Wound Number: 11 Primary Etiology: Pressure Ulcer Wound Location: Left, Medial Upper Leg Wound Status: Open Wounding Event: Gradually Appeared Date Acquired: 09/14/2021 Weeks Of Treatment: 32 Clustered Wound: No Photos Photo Uploaded By: Donavan Burnet on 04/29/2022 18:09:53 Wound Measurements Length: (cm) Width: (cm) Depth: (cm) Area: (cm) Volume: (cm) 0 % Reduction in Area: 100% 0 % Reduction in Volume: 100% 0 0 0 Wound Description Classification: Category/Stage III Exudate Amount: Large Exudate Type: Serosanguineous Exudate Color: red, brown Electronic Signature(s) Signed: 04/29/2022 5:10:40 PM By: Deon Pilling RN, BSN Entered By: Deon Pilling on 04/29/2022 13:31:19 -------------------------------------------------------------------------------- Wound Assessment Details Patient Name: Date of Service: [place] Yesenia San, [name] 04/29/2022 1:30 PM Medical Record Number: [id number] Patient Account Number: [id number] Date of Birth/Sex: Treating RN: 30-May-1951 (71 y.o. [name], Meta.Reding Primary Care Rondal Vandevelde: Other Clinician: [name] Referring Vittoria Noreen: Treating Fatuma Dowers/Extender: Alysia Penna in Treatment: 19 Wound Status Wound Number: 14 Primary Pressure Ulcer Etiology: Wound Location: Right, Lateral Upper Leg Wound Open Wounding Event: Pressure Injury Status: Date Acquired: 12/07/2021 Comorbid Cataracts, Glaucoma, Chronic sinus problems/congestion, Asthma, Weeks Of Treatment: 19 History: Hypertension, Type II Diabetes, Osteoarthritis,  Neuropathy Clustered Wound: No Photos Photo Uploaded By: Donavan Burnet on 04/29/2022 18:09:53 Wound Measurements Length: (cm) 0.5 Width: (cm) 0.3 Depth: (cm) 0.1 Area: (cm) 0.118 Volume: (cm) 0.012 % Reduction in Area: 87.5% % Reduction in Volume: 87.2% Epithelialization: Large (67-100%) Tunneling: No Undermining: No Wound Description Classification: Category/Stage III Wound Margin: Distinct, outline attached Exudate Amount: Medium Exudate Type: Serosanguineous Exudate Color: red, brown Foul Odor After Cleansing: No Slough/Fibrino Yes Wound Bed Granulation Amount: Large (67-100%) Exposed Structure Granulation Quality: Red Fascia Exposed: No Necrotic Amount: Small (1-33%) Fat Layer (Subcutaneous Tissue) Exposed: Yes Necrotic Quality: Adherent Slough Tendon Exposed: No Muscle Exposed: No Joint Exposed: No Bone Exposed: No Electronic Signature(s) Signed: 04/29/2022 5:10:40 PM By: Deon Pilling RN, BSN Entered By: Deon Pilling on 04/29/2022 13:38:12 -------------------------------------------------------------------------------- Wound Assessment Details Patient Name: Date of Service: [place] Yesenia San, [name] 04/29/2022 1:30 PM Medical Record Number: [id number] Patient Account Number: [id number] Date of Birth/Sex: Treating RN: 1950/11/09 (71 y.o. [name] Primary Care Kadence Mimbs: [name] Other Clinician: Referring Jeannemarie Sawaya: Treating Devron Cohick/Extender: Alysia Penna in Treatment: 28 Wound Status Wound Number: 16 Primary MASD Etiology: Wound Location: Left, Anterior Upper Leg Wound Open Wounding Event: Gradually Appeared Status: Date Acquired: 04/29/2022 Comorbid Cataracts, Glaucoma, Chronic sinus problems/congestion, Asthma, Weeks Of Treatment: 0 History: Hypertension, Type II Diabetes, Osteoarthritis, Neuropathy Clustered Wound: No  Photos Photo Uploaded By: Donavan Burnet on 04/29/2022 18:09:53 Wound  Measurements Length: (cm) 1.2 Width: (cm) 0.6 Depth: (cm) 0.7 Area: (cm) 0.565 Volume: (cm) 0.396 % Reduction in Area: % Reduction in Volume: Tunneling: No Undermining: Yes Starting Position (o'clock): 6 Ending Position (o'clock): 12 Maximum Distance: (cm) 1.5 Wound Description Classification: Full Thickness With Exposed Support Structures Wound Margin: Distinct, outline attached Exudate Amount: Medium Exudate Type: Serosanguineous Exudate Color: red, brown Foul Odor After Cleansing: No Slough/Fibrino Yes Wound Bed Granulation Amount: Medium (34-66%) Exposed Structure Granulation Quality: Red, Pink Fascia Exposed: No Necrotic Amount: Medium (34-66%) Fat Layer (Subcutaneous Tissue) Exposed: Yes Necrotic Quality: Adherent Slough Tendon Exposed: No Muscle Exposed: No Joint Exposed: No Bone Exposed: No Electronic Signature(s) Signed: 04/29/2022 5:10:40 PM By: Deon Pilling RN, BSN Entered By: Deon Pilling on 04/29/2022 13:35:19 -------------------------------------------------------------------------------- Wound Assessment Details Patient Name: Date of Service: MO Yesenia San, [name] 04/29/2022 1:30 PM Medical Record Number: [id number] Patient Account Number: [id number] Date of Birth/Sex: Treating RN: [birth year] (71 y.o. [name], Meta.Reding Primary Care Shriyan Arakawa: [name] Other Clinician: Referring Karynn Deblasi: Treating Normagene Harvie/Extender: Alysia Penna in Treatment: 70 Wound Status Wound Number: 32 Primary MASD Etiology: Wound Location: Right, Anterior Upper Leg Wound Open Wounding Event: Gradually Appeared Status: Date Acquired: 04/29/2022 Date Acquired: 04/29/2022 Comorbid Cataracts, Glaucoma, Chronic sinus problems/congestion, Asthma, Weeks Of Treatment: 0 History: Hypertension, Type II Diabetes, Osteoarthritis, Neuropathy Clustered Wound: No Photos Photo Uploaded By: Donavan Burnet on 04/29/2022 18:09:53 Wound  Measurements Length: (cm) 1.3 Width: (cm) 1.1 Depth: (cm) 3.5 Area: (cm) 1.123 Volume: (cm) 3.931 % Reduction in Area: % Reduction in Volume: Epithelialization: None Tunneling: No Undermining: Yes Starting Position (o'clock): 7 Ending Position (o'clock): 9 Maximum Distance: (cm) 0.8 Wound Description Classification: Full Thickness With Exposed Support Structures Wound Margin: Distinct, outline attached Exudate Amount: Medium Exudate Type: Serosanguineous Exudate Color: red, brown Foul Odor After Cleansing: No Slough/Fibrino Yes Wound Bed Granulation Amount: Medium (34-66%) Exposed Structure Granulation Quality: Red, Pink Fascia Exposed: No Necrotic Amount: Medium (34-66%) Fat Layer (Subcutaneous Tissue) Exposed: Yes Necrotic Quality: Adherent Slough Tendon Exposed: No Muscle Exposed: No Joint Exposed: No Bone Exposed: No Electronic Signature(s) Signed: 04/29/2022 5:10:40 PM By: Deon Pilling RN, BSN Entered By: Deon Pilling on 04/29/2022 13:40:07 -------------------------------------------------------------------------------- Wound Assessment Details Patient Name: Date of Service: [place] Yesenia San, [name] 04/29/2022 1:30 PM Medical Record Number: [id number] Patient Account Number: [id number] Date of Birth/Sex: Treating RN: [birth year] (71 y.o. [name], Meta.Reding Primary Care Maryruth Apple: [name] Other Clinician: Referring Shalane Florendo: Treating Janelys Glassner/Extender: Alysia Penna in Treatment: 52 Wound Status Wound Number: 8 Primary Diabetic Wound/Ulcer of the Lower Extremity Etiology: Wound Location: Left, Lateral Upper Leg Wound Open Wounding Event: Gradually Appeared Status: Date Acquired: 01/06/2021 Comorbid Cataracts, Glaucoma, Chronic sinus problems/congestion, Asthma, Weeks Of Treatment: 52 History: Hypertension, Type II Diabetes, Osteoarthritis, Neuropathy Clustered Wound: No Photos Photo Uploaded By: Donavan Burnet on  04/29/2022 18:10:34 Wound Measurements Length: (cm) 11.2 Width: (cm) 3.8 Depth: (cm) 0.8 Area: (cm) 33.427 Volume: (cm) 26.741 % Reduction in Area: 70.4% % Reduction in Volume: 90.5% Epithelialization: Medium (34-66%) Tunneling: No Undermining: No Wound Description Classification: Grade 2 Wound Margin: Distinct, outline attached Exudate Amount: Medium Exudate Type: Serosanguineous Exudate Color: red, brown Foul Odor After Cleansing: No Slough/Fibrino Yes Wound Bed Granulation Amount: Small (1-33%) Exposed Structure Granulation Quality: Red, Pink, Pale Fascia Exposed: No Necrotic Amount: Large (67-100%) Fat Layer (Subcutaneous Tissue) Exposed: Yes Tendon Exposed: No Muscle Exposed: No Joint  Exposed: No Bone Exposed: No Electronic Signature(s) Signed: 04/29/2022 5:10:40 PM By: Deon Pilling RN, BSN Entered By: Deon Pilling on 04/29/2022 13:33:15 -------------------------------------------------------------------------------- Vitals Details Patient Name: Date of Service: [place] Yesenia San, [name] 04/29/2022 1:30 PM Medical Record Number: [id number] Patient Account Number: [id number] Date of Birth/Sex: Treating RN: [birth year] (71 y.o. [name], Tammi Klippel Primary Care Kenard Morawski: [name] Other Clinician: Referring Burlin Mcnair: Treating Alberta Lenhard/Extender: Alysia Penna in Treatment: 29 Vital Signs Time Taken: 13:25 Temperature (F): 98.4 Height (in): 63 Pulse (bpm): 63 Weight (lbs): 279 Respiratory Rate (breaths/min): 16 Body Mass Index (BMI): 49.4 Blood Pressure (mmHg): 100/67 Capillary Blood Glucose (mg/dl): 164 Reference Range: 80 - 120 mg / dl Electronic Signature(s) Signed: 04/29/2022 5:10:40 PM By: Deon Pilling RN, BSN Entered By: Deon Pilling on 04/29/2022 13:44:50

## 2022-04-29 NOTE — Progress Notes (Signed)
Yesenia Payne, Yesenia Payne ([id number]) Visit Report for 04/29/2022 Chief Complaint Document Details Patient Name: Date of Service: [place] Yesenia Payne, [name] 04/29/2022 1:30 PM Medical Record Number: [id number] Patient Account Number: [id number] Date of Birth/Sex: Treating RN: 1951/03/05 (71 y.o. Yesenia Payne Primary Care Provider: [name] Other Clinician: Referring Provider: Treating Provider/Extender: Alysia Penna in Treatment: 86 Information Obtained from: Patient Chief Complaint Left lower extremity wounds and right upper leg wound, Pannus wounds Electronic Signature(s) Signed: 04/29/2022 4:03:56 PM By: Kalman Shan DO Entered By: Kalman Shan on 04/29/2022 15:10:27 -------------------------------------------------------------------------------- Debridement Details Patient Name: Date of Service: [place] Yesenia San, [name] 04/29/2022 1:30 PM Medical Record Number: [id number] Patient Account Number: [id number] Date of Birth/Sex: Treating RN: April 15, 1951 (71 y.o. Yesenia Payne, Meta.Reding Primary Care Provider: [name] Other Clinician: Referring Provider: Treating Provider/Extender: Alysia Penna in Treatment: 52 Debridement Performed for Assessment: Wound #8 Left,Lateral Upper Leg Performed By: Physician Kalman Shan, DO Debridement Type: Debridement Severity of Tissue Pre Debridement: Fat layer exposed Level of Consciousness (Pre-procedure): Awake and Alert Pre-procedure Verification/Time Out Yes - 14:10 Taken: Start Time: 14:11 Pain Control: Lidocaine 4% T opical Solution T Area Debrided (L x W): otal 11.2 (cm) x 3.8 (cm) = 42.56 (cm) Tissue and other material debrided: Viable, Non-Viable, Slough, Subcutaneous, Slough Level: Skin/Subcutaneous Tissue Debridement Description: Excisional Instrument: Curette Bleeding: Minimum Hemostasis Achieved: Pressure End Time: 14:19 Procedural Pain: 0 Post Procedural Pain:  0 Response to Treatment: Procedure was tolerated well Level of Consciousness (Post- Awake and Alert procedure): Post Debridement Measurements of Total Wound Length: (cm) 11.2 Width: (cm) 3.8 Depth: (cm) 0.8 Volume: (cm) 26.741 Character of Wound/Ulcer Post Debridement: Requires Further Debridement Severity of Tissue Post Debridement: Fat layer exposed Post Procedure Diagnosis Same as Pre-procedure Electronic Signature(s) Signed: 04/29/2022 4:03:56 PM By: Kalman Shan DO Signed: 04/29/2022 5:10:40 PM By: Deon Pilling RN, BSN Entered By: Deon Pilling on 04/29/2022 14:19:46 -------------------------------------------------------------------------------- Debridement Details Patient Name: Date of Service: [place] Yesenia San, [name] 04/29/2022 1:30 PM Medical Record Number: [id number] Patient Account Number: [id number] Date of Birth/Sex: Treating RN: [birth year] (71 y.o. Yesenia Payne Primary Care Provider: [name] Other Clinician: Referring Provider: Treating Provider/Extender: Alysia Penna in Treatment: 52 Debridement Performed for Assessment: Wound #17 Right,Anterior Upper Leg Performed By: Clinician Deon Pilling, RN Debridement Type: Chemical/Enzymatic/Mechanical Agent Used: gauze and wound cleanser Level of Consciousness (Pre-procedure): Awake and Alert Pre-procedure Verification/Time Out No Taken: Bleeding: None Response to Treatment: Procedure was tolerated well Level of Consciousness (Post- Awake and Alert procedure): Post Debridement Measurements of Total Wound Length: (cm) 1.3 Width: (cm) 1.1 Depth: (cm) 3.5 Volume: (cm) 3.931 Character of Wound/Ulcer Post Debridement: Stable Post Procedure Diagnosis Same as Pre-procedure Electronic Signature(s) Signed: 04/29/2022 4:03:56 PM By: Kalman Shan DO Signed: 04/29/2022 5:10:40 PM By: Deon Pilling RN, BSN Entered By: Deon Pilling on 04/29/2022  14:20:58 -------------------------------------------------------------------------------- Debridement Details Patient Name: Date of Service: [place] Yesenia San, [name] 04/29/2022 1:30 PM Medical Record Number: [id number] Patient Account Number: [id number] Date of Birth/Sex: Treating RN: 04-13-1951 (72 y.o. Yesenia Payne, Yesenia Payne Primary Care Provider: [name] Other Clinician: Referring Provider: Treating Provider/Extender: Alysia Penna in Treatment: 52 Debridement Performed for Assessment: Wound #16 Left,Anterior Upper Leg Performed By: Clinician Deon Pilling, RN Debridement Type: Chemical/Enzymatic/Mechanical Agent Used: gauze and wound cleanser Level of Consciousness (Pre-procedure): Awake and Alert Pre-procedure Verification/Time Out No Taken: Bleeding: None Response to Treatment: Procedure was  tolerated well Level of Consciousness (Post- Awake and Alert procedure): Post Debridement Measurements of Total Wound Length: (cm) 1.2 Width: (cm) 0.6 Depth: (cm) 0.7 Volume: (cm) 0.396 Character of Wound/Ulcer Post Debridement: Stable Post Procedure Diagnosis Same as Pre-procedure Electronic Signature(s) Signed: 04/29/2022 4:03:56 PM By: Kalman Shan DO Signed: 04/29/2022 5:10:40 PM By: Deon Pilling RN, BSN Entered By: Deon Pilling on 04/29/2022 14:21:19 -------------------------------------------------------------------------------- HPI Details Patient Name: Date of Service: MO Yesenia San, [name] 04/29/2022 1:30 PM Medical Record Number: [id number] Patient Account Number: [id number] Date of Birth/Sex: Treating RN: 1951-09-03 (71 y.o. Yesenia Payne Primary Care Provider: [name] Other Clinician: Referring Provider: Treating Provider/Extender: Alysia Penna in Treatment: 5 History of Present Illness HPI Description: Admission 04/30/2021 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 She has been seen in our clinic multiple times last seen in 03/2019 for right toe wounds. Today she presents for 3 wounds. 1 is located to the right upper leg and 2 to the left lower extremity. They started at the end of December And not sure how I started. She had not been using any dressings up until 2 weeks ago. She is currently using silver alginate to the right upper leg and Santyl to the wounds on the left leg. She has been on doxycycline and 2 rounds of clindamycin for cellulitis to these wounds. She finished her last round of antibiotics 3 weeks ago. She reports minimal pain to the wound. She currently denies signs of infection including increased erythema, warmth or purulent drainage. 7/5; patient presents for 1 week follow-up. She has been using Santyl to the left lower extremity wounds and silver alginate to the right upper leg wound. She denies signs of infection. She has no complaints today. 7/19; patient presents for 1 week follow-up. She has been using Santyl to the left lower extremity wounds and silver alginate to the right upper leg wound. She denies signs of infection. She has had to use a lot of Santyl to the lateral wound since the necrotic tissue has been breaking down and the wound is deeper. 8/4; patient presents for 2-week follow-up. She has been using Santyl to the left lower extremity wound and silver alginate to the right upper leg wound. She has been using the wound VAC to the left lateral wound. She has no issues or complaints today. She denies signs of infection. 8/18; patient presents for 2-week follow-up. She has been using Santyl and silver alginate to the right upper wound. She reports that this is deeper today. She has been using the wound VAC to the left lateral wound. She has no issues or complaints today. She denies signs of infection. She reports that the  left lower extremity medial wound is healed 9/1; patient presents for 2-week follow-up. She continues to use silver alginate to the right upper wound. She is using the wound VAC to the left lateral wound. She has no issues today. She denies signs of infection. She overall feels well. 10/10; patient presents for follow-up. Patient has not followed up in a month. She states that her husband was recently in the ICU but is now at home. She reports an increase in the wound size to the right lower extremity. She reports improvement to the left lower extremity wound. She currently denies signs of infection. She states she overall feels well. 10/24; patient presents for  follow-up. She has a new wound to the right upper thigh. She has been keeping the area covered until yesterday when she started silver alginate. She continues to use Dakin wet-to-dry dressings to the other right upper leg wound. She continues to use the [facility] to the left lower extremity wound. No obvious signs of infection on exam. 11/7; patient presents for follow-up. She again has new wounds to her pannus folds. She is keeping the area dry with absorbent pads and using silver alginate to the wound beds. She continues to use the wound VAC to the left lower extremity wound without issues. She denies signs of infection. 11/21; patient presents for follow-up. She continues to have new wounds to her pannus folds. Her daughter helps with dressing changes and is keeping the areas dry with absorbent pads and using silver alginate to the wound beds. She continues to use the wound VAC to the left lower extremity wound without issues. She currently denies signs of infection. 12/19; patient presents for follow-up. She continues to have new wounds to her pannus folds. Her daughter helps with the dressings and has been using Hibiclens and silver alginate to the wound beds. Patient continues to use the wound VAC to the left lower extremity wound. She has no  issues or complaints today. She denies signs of infection. 1/16; patient presents for follow-up. She again has developed a new wound to the groin. She continues to use Hibiclens and silver alginate to the pannus/groin wounds. She continues to use the wound VAC to the left lower extremity without issues. She denies signs of infection. 2/6; patient presents for follow-up. She can no longer apply white foam under the VAC due to the tissue growing in and filling the tunnels. She continues to use Hibiclens and silver alginate to the pannus/groin wounds. 3/16; patient presents for follow-up. She has been using Hydrofera Blue to the left lateral leg wound. She has been using hydroferra to the pannus/groin wounds. She has no issues or complaints today. 3/30; patient presents for follow-up. She has been using Hydrofera Blue and Santyl to the left lateral leg. She has been using Dakin's wet-to-dry dressings to the pannus/groin wounds. She denies signs of infection. 5/9; patient presents for follow-up. She has been using Hydrofera Blue and Santyl to the left lateral leg wound without issues. She has been using antifungal cream/spray to the pannus/groin wounds with significant improvement to her wound healing. She currently denies signs of infection. 6/22; patient presents for follow-up. Patient has been using antifungal spray to the abdominal and groin wounds. She has been using Hydrofera Blue and Santyl to the left lateral leg however it is unclear if she is truly using Santyl because she says it is difficult to obtain due to cost. She has a couple new wounds T her o upper legs bilaterally. Electronic Signature(s) Signed: 04/29/2022 4:03:56 PM By: Kalman Shan DO Entered By: Kalman Shan on 04/29/2022 15:14:41 -------------------------------------------------------------------------------- Physical Exam Details Patient Name: Date of Service: [place] Yesenia San, [name] 04/29/2022 1:30 PM Medical  Record Number: [id number] Patient Account Number: [id number] Date of Birth/Sex: Treating RN: 27-Oct-1951 (71 y.o. Yesenia Payne Primary Care Provider: [name] Other Clinician: Referring Provider: Treating Provider/Extender: Alysia Penna in Treatment: 71 Constitutional respirations regular, non-labored and within target range for patient.. Cardiovascular 2+ dorsalis pedis/posterior tibialis pulses. Psychiatric pleasant and cooperative. Notes Left lower extremity: Open wound to the lateral aspect with nonviable tissue throughout. Several wounds to the pannus folds and  thigh folds with granulation tissue. 2 new wounds 1 to each upper leg bilaterally. The one on the left leg has thickened drainage. No surrounding soft tissue infection. Electronic Signature(s) Signed: 04/29/2022 4:03:56 PM By: Kalman Shan DO Entered By: Kalman Shan on 04/29/2022 15:15:59 -------------------------------------------------------------------------------- Physician Orders Details Patient Name: Date of Service: [place] Yesenia San, [name] 04/29/2022 1:30 PM Medical Record Number: [id number] Patient Account Number: [id number] Date of Birth/Sex: Treating RN: [birth year] (71 y.o. Yesenia Payne, Meta.Reding Primary Care Provider: [name] Other Clinician: Referring Provider: Treating Provider/Extender: Alysia Penna in Treatment: 59 Verbal / Phone Orders: No Diagnosis Coding ICD-10 Coding Code Description [phone] 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ity L97.819 Non-pressure chronic ulcer of other part of right lower leg with unspecified severity E11.9 Type 2 diabetes mellitus without complications P54.656 Acquired absence of left leg below knee S31.109D Unspecified open wound of abdominal wall, unspecified quadrant without penetration into peritoneal cavity, subsequent encounter Follow-up  Appointments ppointment in 2 weeks. - Dr. Heber [place] and [facility] Room 8 - ***STRETCHER -75 minutes extra time*** 1245pm Thursday 05/13/2022 Return A Other: - will call you with culture results if positive will send the order for topical compounding antibiotics from [facility]. They will call you as well. Bathing/ Shower/ Hygiene Other Bathing/Shower/Hygiene Orders/Instructions: - You may sponge bathe use hibiclens wash with dressing changes with abdominal skin folds. Off-Loading Turn and reposition every 2 hours Wound Treatment Wound #10 - Abdomen - Lower Quadrant Wound Laterality: Right Cleanser: Soap and Water Every Other Day/30 Days Discharge Instructions: May shower and wash wound with dial antibacterial soap and water prior to dressing change. Peri-Wound Care: Ketoconazole Cream 2% Every Other Day/30 Days Discharge Instructions: mixed with zinc in office. Peri-Wound Care: Zinc Oxide Ointment 30g tube Every Other Day/30 Days Discharge Instructions: Apply Zinc Oxide to periwound with each dressing change Peri-Wound Care: over the counter antifungal cream Every Other Day/30 Days Discharge Instructions: mix with zinc and apply to skin folds. Secondary Dressing: ABD Pad, 5x9 (DME) (Generic) Every Other Day/30 Days Discharge Instructions: Apply over primary dressing as directed. Secondary Dressing: Woven Gauze Sponge, Non-Sterile 4x4 in (DME) (Generic) Every Other Day/30 Days Discharge Instructions: Apply over primary dressing as directed. Secured With: 58M Medipore H Soft Cloth Surgical T ape, 4 x 10 (in/yd) (DME) (Generic) Every Other Day/30 Days Discharge Instructions: Secure with tape as directed. Wound #14 - Upper Leg Wound Laterality: Right, Lateral Cleanser: Soap and Water Every Other Day/30 Days Discharge Instructions: May shower and wash wound with dial antibacterial soap and water prior to dressing change. Peri-Wound Care: Ketoconazole Cream 2% Every Other Day/30 Days Discharge  Instructions: mixed with zinc in office. Peri-Wound Care: Zinc Oxide Ointment 30g tube Every Other Day/30 Days Discharge Instructions: Apply Zinc Oxide to periwound with each dressing change Peri-Wound Care: over the counter antifungal cream Every Other Day/30 Days Discharge Instructions: mix with zinc and apply to skin folds. Secondary Dressing: ABD Pad, 5x9 (DME) (Generic) Every Other Day/30 Days Discharge Instructions: Apply over primary dressing as directed. Secondary Dressing: Woven Gauze Sponge, Non-Sterile 4x4 in (DME) (Generic) Every Other Day/30 Days Discharge Instructions: Apply over primary dressing as directed. Secured With: 58M Medipore H Soft Cloth Surgical T ape, 4 x 10 (in/yd) (DME) (Generic) Every Other Day/30 Days Discharge Instructions: Secure with tape as directed. Wound #16 - Upper Leg Wound Laterality: Left, Anterior Cleanser: Soap and Water 1 x Per Day/30 Days Discharge Instructions: May shower  and wash wound with dial antibacterial soap and water prior to dressing change. Cleanser: Wound Cleanser 1 x Per Day/30 Days Discharge Instructions: Cleanse the wound with wound cleanser prior to applying a clean dressing using gauze sponges, not tissue or cotton balls. Topical: Topical compounding antibiotics from [facility] 1 x Per Day/30 Days Discharge Instructions: mix and apply daily to wound bed once arrives in place of Dakin's. Prim Dressing: Dakin's Solution 0.25%, 16 (oz) 1 x Per Day/30 Days ary Discharge Instructions: Moisten gauze with Dakin's solution Secondary Dressing: ABD Pad, 5x9 (DME) (Generic) 1 x Per Day/30 Days Discharge Instructions: Apply over primary dressing as directed. Secondary Dressing: Woven Gauze Sponge, Non-Sterile 4x4 in (DME) (Generic) 1 x Per Day/30 Days Discharge Instructions: Apply over primary dressing as directed. Secured With: 60M Medipore H Soft Cloth Surgical T ape, 4 x 10 (in/yd) (DME) (Generic) 1 x Per Day/30 Days Discharge  Instructions: Secure with tape as directed. Wound #17 - Upper Leg Wound Laterality: Right, Anterior Cleanser: Soap and Water 1 x Per Day/30 Days Discharge Instructions: May shower and wash wound with dial antibacterial soap and water prior to dressing change. Cleanser: Wound Cleanser 1 x Per Day/30 Days Discharge Instructions: Cleanse the wound with wound cleanser prior to applying a clean dressing using gauze sponges, not tissue or cotton balls. Topical: Topical compounding antibiotics from [facility] 1 x Per Day/30 Days Discharge Instructions: mix and apply daily to wound bed once arrives in place of Dakin's. Prim Dressing: Dakin's Solution 0.25%, 16 (oz) 1 x Per Day/30 Days ary Discharge Instructions: Moisten gauze with Dakin's solution Secondary Dressing: ABD Pad, 5x9 (DME) (Generic) 1 x Per Day/30 Days Discharge Instructions: Apply over primary dressing as directed. Secondary Dressing: Woven Gauze Sponge, Non-Sterile 4x4 in (DME) (Generic) 1 x Per Day/30 Days Discharge Instructions: Apply over primary dressing as directed. Secured With: 60M Medipore H Soft Cloth Surgical T ape, 4 x 10 (in/yd) (DME) (Generic) 1 x Per Day/30 Days Discharge Instructions: Secure with tape as directed. Wound #8 - Upper Leg Wound Laterality: Left, Lateral Cleanser: Normal Saline (Generic) 1 x Per Day/30 Days Discharge Instructions: Cleanse the wound with Normal Saline prior to applying a clean dressing using gauze sponges, not tissue or cotton balls. Cleanser: Soap and Water 1 x Per Day/30 Days Discharge Instructions: May shower and wash wound with dial antibacterial soap and water prior to dressing change. Topical: Topical antibiotics from [facility] 1 x Per Day/30 Days Discharge Instructions: apply under the iodosorb daily Prim Dressing: Iodosorb Gel 10 (gm) Tube (DME) (Generic) 1 x Per Day/30 Days ary Discharge Instructions: Apply to wound bed as instructed Secondary Dressing: Woven Gauze  Sponge, Non-Sterile 4x4 in (DME) (Generic) 1 x Per Day/30 Days Discharge Instructions: Apply over primary dressing as directed. Secondary Dressing: Zetuvit Plus 4x8 in (DME) (Generic) 1 x Per Day/30 Days Discharge Instructions: Apply over primary dressing as directed. Secured With: 60M Medipore H Soft Cloth Surgical T 4 x 2 (in/yd) (DME) (Generic) 1 x Per Day/30 Days ape Discharge Instructions: Secure dressing with tape as directed. Laboratory naerobe culture (MICRO) - culture left anterior upper leg wound. - (ICD10 L97.829 - Non-pressure Bacteria identified in Unspecified specimen by A chronic ulcer of other part of left lower leg with unspecified severity) LOINC Code: 237-6 Convenience Name: Anaerobic culture Electronic Signature(s) Signed: 04/29/2022 4:03:56 PM By: Kalman Shan DO Entered By: Kalman Shan on 04/29/2022 15:16:24 -------------------------------------------------------------------------------- Problem List Details Patient Name: Date of Service: [place] RTO N, [place] RGA RET  D. 04/29/2022 1:30 PM Medical Record Number: [id number] Patient Account Number: [id number] Date of Birth/Sex: Treating RN: 1951/03/15 (71 y.o. Yesenia Payne, Meta.Reding Primary Care Provider: [name] Other Clinician: Referring Provider: Treating Provider/Extender: Alysia Penna in Treatment: 52 Active Problems ICD-10 Encounter Code Description Active Date MDM Diagnosis L97.829 Non-pressure chronic ulcer of other part of left lower leg with unspecified 04/30/2021 No Yes severity L97.819 Non-pressure chronic ulcer of other part of right lower leg with unspecified 04/30/2021 No Yes severity E11.9 Type 2 diabetes mellitus without complications 6/62/9476 No Yes Z89.512 Acquired absence of left leg below knee 04/30/2021 No Yes S31.109D Unspecified open wound of abdominal wall, unspecified quadrant without 09/28/2021 No Yes penetration into peritoneal cavity, subsequent  encounter Inactive Problems Resolved Problems Electronic Signature(s) Signed: 04/29/2022 4:03:56 PM By: Kalman Shan DO Entered By: Kalman Shan on 04/29/2022 15:09:31 -------------------------------------------------------------------------------- Progress Note Details Patient Name: Date of Service: [place] Yesenia San, [name] 04/29/2022 1:30 PM Medical Record Number: [id number] Patient Account Number: [id number] Date of Birth/Sex: Treating RN: May 22, 1951 (71 y.o. Yesenia Payne Primary Care Provider: [name] Other Clinician: Referring Provider: Treating Provider/Extender: Alysia Penna in Treatment: 52 Subjective Chief Complaint Information obtained from Patient Left lower extremity wounds and right upper leg wound, Pannus wounds History of Present Illness (HPI) Admission 04/30/2021 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 She has been seen in our clinic multiple times last seen in 03/2019 for right toe wounds. Today she presents for 3 wounds. 1 is located to the right upper leg and 2 to the left lower extremity. They started at the end of December And not sure how I started. She had not been using any dressings up until 2 weeks ago. She is currently using silver alginate to the right upper leg and Santyl to the wounds on the left leg. She has been on doxycycline and 2 rounds of clindamycin for cellulitis to these wounds. She finished her last round of antibiotics 3 weeks ago. She reports minimal pain to the wound. She currently denies signs of infection including increased erythema, warmth or purulent drainage. 7/5; patient presents for 1 week follow-up. She has been using Santyl to the left lower extremity wounds and silver alginate to the right upper leg wound. She denies signs of infection. She has no  complaints today. 7/19; patient presents for 1 week follow-up. She has been using Santyl to the left lower extremity wounds and silver alginate to the right upper leg wound. She denies signs of infection. She has had to use a lot of Santyl to the lateral wound since the necrotic tissue has been breaking down and the wound is deeper. 8/4; patient presents for 2-week follow-up. She has been using Santyl to the left lower extremity wound and silver alginate to the right upper leg wound. She has been using the wound VAC to the left lateral wound. She has no issues or complaints today. She denies signs of infection. 8/18; patient presents for 2-week follow-up. She has been using Santyl and silver alginate to the right upper wound. She reports that this is deeper today. She has been using the wound VAC to the left lateral wound. She has no issues or complaints today. She denies signs of infection. She reports that the left lower extremity medial wound is healed 9/1; patient presents for 2-week follow-up. She continues to  use silver alginate to the right upper wound. She is using the wound VAC to the left lateral wound. She has no issues today. She denies signs of infection. She overall feels well. 10/10; patient presents for follow-up. Patient has not followed up in a month. She states that her husband was recently in the ICU but is now at home. She reports an increase in the wound size to the right lower extremity. She reports improvement to the left lower extremity wound. She currently denies signs of infection. She states she overall feels well. 10/24; patient presents for follow-up. She has a new wound to the right upper thigh. She has been keeping the area covered until yesterday when she started silver alginate. She continues to use Dakin wet-to-dry dressings to the other right upper leg wound. She continues to use the [facility] to the left lower extremity wound. No obvious signs of infection on  exam. 11/7; patient presents for follow-up. She again has new wounds to her pannus folds. She is keeping the area dry with absorbent pads and using silver alginate to the wound beds. She continues to use the wound VAC to the left lower extremity wound without issues. She denies signs of infection. 11/21; patient presents for follow-up. She continues to have new wounds to her pannus folds. Her daughter helps with dressing changes and is keeping the areas dry with absorbent pads and using silver alginate to the wound beds. She continues to use the wound VAC to the left lower extremity wound without issues. She currently denies signs of infection. 12/19; patient presents for follow-up. She continues to have new wounds to her pannus folds. Her daughter helps with the dressings and has been using Hibiclens and silver alginate to the wound beds. Patient continues to use the wound VAC to the left lower extremity wound. She has no issues or complaints today. She denies signs of infection. 1/16; patient presents for follow-up. She again has developed a new wound to the groin. She continues to use Hibiclens and silver alginate to the pannus/groin wounds. She continues to use the wound VAC to the left lower extremity without issues. She denies signs of infection. 2/6; patient presents for follow-up. She can no longer apply white foam under the VAC due to the tissue growing in and filling the tunnels. She continues to use Hibiclens and silver alginate to the pannus/groin wounds. 3/16; patient presents for follow-up. She has been using Hydrofera Blue to the left lateral leg wound. She has been using hydroferra to the pannus/groin wounds. She has no issues or complaints today. 3/30; patient presents for follow-up. She has been using Hydrofera Blue and Santyl to the left lateral leg. She has been using Dakin's wet-to-dry dressings to the pannus/groin wounds. She denies signs of infection. 5/9; patient presents for  follow-up. She has been using Hydrofera Blue and Santyl to the left lateral leg wound without issues. She has been using antifungal cream/spray to the pannus/groin wounds with significant improvement to her wound healing. She currently denies signs of infection. 6/22; patient presents for follow-up. Patient has been using antifungal spray to the abdominal and groin wounds. She has been using Hydrofera Blue and Santyl to the left lateral leg however it is unclear if she is truly using Santyl because she says it is difficult to obtain due to cost. She has a couple new wounds T her o upper legs bilaterally. Patient History Information obtained from Patient. Family History Cancer - Child, Diabetes - Father, Heart Disease -  Mother,Father, Hypertension - Mother,Father, No family history of Hereditary Spherocytosis, Kidney Disease, Lung Disease, Seizures, Stroke, Thyroid Problems, Tuberculosis. Social History Never smoker, Marital Status - Married, Alcohol Use - Never, Drug Use - No History, Caffeine Use - Daily - coffee , sodas. Medical History Eyes Patient has history of Cataracts - lens implant done, Glaucoma Denies history of Optic Neuritis Ear/Nose/Mouth/Throat Patient has history of Chronic sinus problems/congestion - seasonal Denies history of Middle ear problems Hematologic/Lymphatic Denies history of Anemia, Hemophilia, Human Immunodeficiency Virus, Lymphedema, Sickle Cell Disease Respiratory Patient has history of Asthma Denies history of Aspiration, Chronic Obstructive Pulmonary Disease (COPD), Pneumothorax, Sleep Apnea, Tuberculosis Cardiovascular Patient has history of Hypertension - not on meds presently Denies history of Angina, Arrhythmia, Congestive Heart Failure, Coronary Artery Disease, Deep Vein Thrombosis, Hypotension, Myocardial Infarction, Peripheral Arterial Disease, Peripheral Venous Disease, Phlebitis, Vasculitis Gastrointestinal Denies history of Cirrhosis , Colitis,  Crohnoos, Hepatitis A, Hepatitis B, Hepatitis C Endocrine Patient has history of Type II Diabetes Denies history of Type I Diabetes Genitourinary Denies history of End Stage Renal Disease Immunological Denies history of Lupus Erythematosus, Raynaudoos, Scleroderma Integumentary (Skin) Denies history of History of Burn Musculoskeletal Patient has history of Osteoarthritis Denies history of Gout, Rheumatoid Arthritis, Osteomyelitis Neurologic Patient has history of Neuropathy Denies history of Dementia, Quadriplegia, Paraplegia, Seizure Disorder Oncologic Denies history of Received Chemotherapy, Received Radiation Psychiatric Denies history of Anorexia/bulimia, Confinement Anxiety Medical A Surgical History Notes nd Cardiovascular Hyperlipidemia Gastrointestinal GERD, Diverticulitis Genitourinary Overactive Bladder Musculoskeletal Fibromyalgia, toe amputations x 3 Psychiatric Anxiety Objective Constitutional respirations regular, non-labored and within target range for patient.. Vitals Time Taken: 1:25 PM, Height: 63 in, Weight: 279 lbs, BMI: 49.4, Temperature: 98.4 F, Pulse: 63 bpm, Respiratory Rate: 16 breaths/min, Blood Pressure: 100/67 mmHg, Capillary Blood Glucose: 164 mg/dl. Cardiovascular 2+ dorsalis pedis/posterior tibialis pulses. Psychiatric pleasant and cooperative. General Notes: Left lower extremity: Open wound to the lateral aspect with nonviable tissue throughout. Several wounds to the pannus folds and thigh folds with granulation tissue. 2 new wounds 1 to each upper leg bilaterally. The one on the left leg has thickened drainage. No surrounding soft tissue infection. Integumentary (Hair, Skin) Wound #10 status is Open. Original cause of wound was Gradually Appeared. The date acquired was: 09/14/2021. The wound has been in treatment 32 weeks. The wound is located on the Right Abdomen - Lower Quadrant. The wound measures 0.5cm length x 1.2cm width x 0.3cm  depth; 0.471cm^2 area and 0.141cm^3 volume. There is Fat Layer (Subcutaneous Tissue) exposed. There is no tunneling or undermining noted. There is a medium amount of serosanguineous drainage noted. The wound margin is distinct with the outline attached to the wound base. There is large (67-100%) red, pink granulation within the wound bed. There is no necrotic tissue within the wound bed. Wound #11 status is Open. Original cause of wound was Gradually Appeared. The date acquired was: 09/14/2021. The wound has been in treatment 32 weeks. The wound is located on the Left,Medial Upper Leg. The wound measures 0cm length x 0cm width x 0cm depth; 0cm^2 area and 0cm^3 volume. There is a large amount of serosanguineous drainage noted. Wound #14 status is Open. Original cause of wound was Pressure Injury. The date acquired was: 12/07/2021. The wound has been in treatment 19 weeks. The wound is located on the Right,Lateral Upper Leg. The wound measures 0.5cm length x 0.3cm width x 0.1cm depth; 0.118cm^2 area and 0.012cm^3 volume. There is Fat Layer (Subcutaneous Tissue) exposed. There is no tunneling or undermining  noted. There is a medium amount of serosanguineous drainage noted. The wound margin is distinct with the outline attached to the wound base. There is large (67-100%) red granulation within the wound bed. There is a small (1-33%) amount of necrotic tissue within the wound bed including Adherent Slough. Wound #16 status is Open. Original cause of wound was Gradually Appeared. The date acquired was: 04/29/2022. The wound is located on the Left,Anterior Upper Leg. The wound measures 1.2cm length x 0.6cm width x 0.7cm depth; 0.565cm^2 area and 0.396cm^3 volume. There is Fat Layer (Subcutaneous Tissue) exposed. There is no tunneling noted, however, there is undermining starting at 6:00 and ending at 12:00 with a maximum distance of 1.5cm. There is a medium amount of serosanguineous drainage noted. The wound  margin is distinct with the outline attached to the wound base. There is medium (34-66%) red, pink granulation within the wound bed. There is a medium (34-66%) amount of necrotic tissue within the wound bed including Adherent Slough. Wound #17 status is Open. Original cause of wound was Gradually Appeared. The date acquired was: 04/29/2022. The wound is located on the Right,Anterior Upper Leg. The wound measures 1.3cm length x 1.1cm width x 3.5cm depth; 1.123cm^2 area and 3.931cm^3 volume. There is Fat Layer (Subcutaneous Tissue) exposed. There is no tunneling noted, however, there is undermining starting at 7:00 and ending at 9:00 with a maximum distance of 0.8cm. There is a medium amount of serosanguineous drainage noted. The wound margin is distinct with the outline attached to the wound base. There is medium (34-66%) red, pink granulation within the wound bed. There is a medium (34-66%) amount of necrotic tissue within the wound bed including Adherent Slough. Wound #8 status is Open. Original cause of wound was Gradually Appeared. The date acquired was: 01/06/2021. The wound has been in treatment 52 weeks. The wound is located on the Left,Lateral Upper Leg. The wound measures 11.2cm length x 3.8cm width x 0.8cm depth; 33.427cm^2 area and 26.741cm^3 volume. There is Fat Layer (Subcutaneous Tissue) exposed. There is no tunneling or undermining noted. There is a medium amount of serosanguineous drainage noted. The wound margin is distinct with the outline attached to the wound base. There is small (1-33%) red, pink, pale granulation within the wound bed. There is a large (67-100%) amount of necrotic tissue within the wound bed. 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 Unfortunately she has developed 2 new wounds to her upper thighs on each leg. I obtained a culture to the wound that was draining thickened drainage. There is no evidence of active soft tissue infection. I think she would benefit from [facility] antibiotics. For now she can continue antifungal spray to the older wounds and Dakin's wet-to-dry dressings to the 2 new wounds. I debrided a significant amount of nonviable tissue to the left lateral leg. Since patient cannot afford Santyl I recommended Iodosorb gel. She can use this daily to this area. Procedures Wound #16 Pre-procedure diagnosis of Wound #16 is a MASD located on the Left,Anterior Upper Leg . There was a Chemical/Enzymatic/Mechanical debridement performed by Deon Pilling, RN.. Other agent  used was gauze and wound cleanser. There was no bleeding. The procedure was tolerated well. Post Debridement Measurements: 1.2cm length x 0.6cm width x 0.7cm depth; 0.396cm^3 volume. Character of Wound/Ulcer Post Debridement is stable. Post procedure Diagnosis Wound #16: Same as Pre-Procedure Wound #17 Pre-procedure diagnosis of Wound #17 is a MASD located on the Right,Anterior Upper Leg . There was a Chemical/Enzymatic/Mechanical debridement performed by Deon Pilling, RN.. Other agent used was gauze and wound cleanser. There was no bleeding. The procedure was tolerated well. Post Debridement Measurements: 1.3cm length x 1.1cm width x 3.5cm depth; 3.931cm^3 volume. Character of Wound/Ulcer Post Debridement is stable. Post procedure Diagnosis Wound #17: Same as Pre-Procedure Wound #8 Pre-procedure diagnosis of Wound #8 is a Diabetic Wound/Ulcer of the Lower Extremity located on the Left,Lateral  Upper Leg .Severity of Tissue Pre Debridement is: Fat layer exposed. There was a Excisional Skin/Subcutaneous Tissue Debridement with a total area of 42.56 sq cm performed by Kalman Shan, DO. With the following instrument(s): Curette to remove Viable and Non-Viable tissue/material. Material removed includes Subcutaneous Tissue and Slough and after achieving pain control using Lidocaine 4% T opical Solution. A time out was conducted at 14:10, prior to the start of the procedure. A Minimum amount of bleeding was controlled with Pressure. The procedure was tolerated well with a pain level of 0 throughout and a pain level of 0 following the procedure. Post Debridement Measurements: 11.2cm length x 3.8cm width x 0.8cm depth; 26.741cm^3 volume. Character of Wound/Ulcer Post Debridement requires further debridement. Severity of Tissue Post Debridement is: Fat layer exposed. Post procedure Diagnosis Wound #8: Same as Pre-Procedure Plan Follow-up Appointments: Return Appointment in 2 weeks. - Dr. Heber Colwyn and [facility] Room 8 - ***STRETCHER -75 minutes extra time*** 1245pm Thursday 05/13/2022 Other: - will call you with culture results if positive will send the order for topical compounding antibiotics from [facility]. They will call you as well. Bathing/ Shower/ Hygiene: Other Bathing/Shower/Hygiene Orders/Instructions: - You may sponge bathe use hibiclens wash with dressing changes with abdominal skin folds. Off-Loading: Turn and reposition every 2 hours Laboratory ordered were: Anaerobic culture - culture left anterior upper leg wound. WOUND #10: - Abdomen - Lower Quadrant Wound Laterality: Right Cleanser: Soap and Water Every Other Day/30 Days Discharge Instructions: May shower and wash wound with dial antibacterial soap and water prior to dressing change. Peri-Wound Care: Ketoconazole Cream 2% Every Other Day/30 Days Discharge Instructions: mixed with zinc in office. Peri-Wound Care: Zinc  Oxide Ointment 30g tube Every Other Day/30 Days Discharge Instructions: Apply Zinc Oxide to periwound with each dressing change Peri-Wound Care: over the counter antifungal cream Every Other Day/30 Days Discharge Instructions: mix with zinc and apply to skin folds. Secondary Dressing: ABD Pad, 5x9 (DME) (Generic) Every Other Day/30 Days Discharge Instructions: Apply over primary dressing as directed. Secondary Dressing: Woven Gauze Sponge, Non-Sterile 4x4 in (DME) (Generic) Every Other Day/30 Days Discharge Instructions: Apply over primary dressing as directed. Secured With: 33M Medipore H Soft Cloth Surgical T ape, 4 x 10 (in/yd) (DME) (Generic) Every Other Day/30 Days Discharge Instructions: Secure with tape as directed. WOUND #14: - Upper Leg Wound Laterality: Right, Lateral Cleanser: Soap and Water Every Other Day/30 Days Discharge Instructions: May shower and wash wound with dial antibacterial soap and water prior to dressing change. Peri-Wound Care: Ketoconazole Cream 2% Every Other Day/30 Days Discharge Instructions: mixed with zinc in office. Peri-Wound Care: Zinc Oxide Ointment 30g tube Every Other Day/30 Days Discharge Instructions: Apply Zinc Oxide to periwound with  each dressing change Peri-Wound Care: over the counter antifungal cream Every Other Day/30 Days Discharge Instructions: mix with zinc and apply to skin folds. Secondary Dressing: ABD Pad, 5x9 (DME) (Generic) Every Other Day/30 Days Discharge Instructions: Apply over primary dressing as directed. Secondary Dressing: Woven Gauze Sponge, Non-Sterile 4x4 in (DME) (Generic) Every Other Day/30 Days Discharge Instructions: Apply over primary dressing as directed. Secured With: 56M Medipore H Soft Cloth Surgical T ape, 4 x 10 (in/yd) (DME) (Generic) Every Other Day/30 Days Discharge Instructions: Secure with tape as directed. WOUND #16: - Upper Leg Wound Laterality: Left, Anterior Cleanser: Soap and Water 1 x Per Day/30  Days Discharge Instructions: May shower and wash wound with dial antibacterial soap and water prior to dressing change. Cleanser: Wound Cleanser 1 x Per Day/30 Days Discharge Instructions: Cleanse the wound with wound cleanser prior to applying a clean dressing using gauze sponges, not tissue or cotton balls. Topical: Topical compounding antibiotics from [facility] 1 x Per Day/30 Days Discharge Instructions: mix and apply daily to wound bed once arrives in place of Dakin's. Prim Dressing: Dakin's Solution 0.25%, 16 (oz) 1 x Per Day/30 Days ary Discharge Instructions: Moisten gauze with Dakin's solution Secondary Dressing: ABD Pad, 5x9 (DME) (Generic) 1 x Per Day/30 Days Discharge Instructions: Apply over primary dressing as directed. Secondary Dressing: Woven Gauze Sponge, Non-Sterile 4x4 in (DME) (Generic) 1 x Per Day/30 Days Discharge Instructions: Apply over primary dressing as directed. Secured With: 56M Medipore H Soft Cloth Surgical T ape, 4 x 10 (in/yd) (DME) (Generic) 1 x Per Day/30 Days Discharge Instructions: Secure with tape as directed. WOUND #17: - Upper Leg Wound Laterality: Right, Anterior Cleanser: Soap and Water 1 x Per Day/30 Days Discharge Instructions: May shower and wash wound with dial antibacterial soap and water prior to dressing change. Cleanser: Wound Cleanser 1 x Per Day/30 Days Discharge Instructions: Cleanse the wound with wound cleanser prior to applying a clean dressing using gauze sponges, not tissue or cotton balls. Topical: Topical compounding antibiotics from [facility] 1 x Per Day/30 Days Discharge Instructions: mix and apply daily to wound bed once arrives in place of Dakin's. Prim Dressing: Dakin's Solution 0.25%, 16 (oz) 1 x Per Day/30 Days ary Discharge Instructions: Moisten gauze with Dakin's solution Secondary Dressing: ABD Pad, 5x9 (DME) (Generic) 1 x Per Day/30 Days Discharge Instructions: Apply over primary dressing as  directed. Secondary Dressing: Woven Gauze Sponge, Non-Sterile 4x4 in (DME) (Generic) 1 x Per Day/30 Days Discharge Instructions: Apply over primary dressing as directed. Secured With: 56M Medipore H Soft Cloth Surgical T ape, 4 x 10 (in/yd) (DME) (Generic) 1 x Per Day/30 Days Discharge Instructions: Secure with tape as directed. WOUND #8: - Upper Leg Wound Laterality: Left, Lateral Cleanser: Normal Saline (Generic) 1 x Per Day/30 Days Discharge Instructions: Cleanse the wound with Normal Saline prior to applying a clean dressing using gauze sponges, not tissue or cotton balls. Cleanser: Soap and Water 1 x Per Day/30 Days Discharge Instructions: May shower and wash wound with dial antibacterial soap and water prior to dressing change. Topical: Topical antibiotics from [facility] 1 x Per Day/30 Days Discharge Instructions: apply under the iodosorb daily Prim Dressing: Iodosorb Gel 10 (gm) Tube (DME) (Generic) 1 x Per Day/30 Days ary Discharge Instructions: Apply to wound bed as instructed Secondary Dressing: Woven Gauze Sponge, Non-Sterile 4x4 in (DME) (Generic) 1 x Per Day/30 Days Discharge Instructions: Apply over primary dressing as directed. Secondary Dressing: Zetuvit Plus 4x8 in (DME) (Generic) 1 x  Per Day/30 Days Discharge Instructions: Apply over primary dressing as directed. Secured With: 40M Medipore H Soft Cloth Surgical T 4 x 2 (in/yd) (DME) (Generic) 1 x Per Day/30 Days ape Discharge Instructions: Secure dressing with tape as directed. 1. In office sharp debridement 2. Iodosorb gel 3. Dakin's wet-to-dry 4. Antifungal spray 5. Follow-up in 2 weeks Electronic Signature(s) Signed: 04/29/2022 4:03:56 PM By: Kalman Shan DO Entered By: Kalman Shan on 04/29/2022 15:19:58 -------------------------------------------------------------------------------- HxROS Details Patient Name: Date of Service: [place] Yesenia San, [name] 04/29/2022 1:30 PM Medical Record Number:  [id number] Patient Account Number: [id number] Date of Birth/Sex: Treating RN: 19-Sep-1951 (71 y.o. Yesenia Payne, Yesenia Payne Primary Care Provider: [name] Other Clinician: Referring Provider: Treating Provider/Extender: Alysia Penna in Treatment: 39 Information Obtained From Patient Eyes Medical History: Positive for: Cataracts - lens implant done; Glaucoma Negative for: Optic Neuritis Ear/Nose/Mouth/Throat Medical History: Positive for: Chronic sinus problems/congestion - seasonal Negative for: Middle ear problems Hematologic/Lymphatic Medical History: Negative for: Anemia; Hemophilia; Human Immunodeficiency Virus; Lymphedema; Sickle Cell Disease Respiratory Medical History: Positive for: Asthma Negative for: Aspiration; Chronic Obstructive Pulmonary Disease (COPD); Pneumothorax; Sleep Apnea; Tuberculosis Cardiovascular Medical History: Positive for: Hypertension - not on meds presently Negative for: Angina; Arrhythmia; Congestive Heart Failure; Coronary Artery Disease; Deep Vein Thrombosis; Hypotension; Myocardial Infarction; Peripheral Arterial Disease; Peripheral Venous Disease; Phlebitis; Vasculitis Past Medical History Notes: Hyperlipidemia Gastrointestinal Medical History: Negative for: Cirrhosis ; Colitis; Crohns; Hepatitis A; Hepatitis B; Hepatitis C Past Medical History Notes: GERD, Diverticulitis Endocrine Medical History: Positive for: Type II Diabetes Negative for: Type I Diabetes Time with diabetes: 12 years Treated with: Insulin Blood sugar tested every day: Yes T ested : bid Blood sugar testing results: Breakfast: 140 max; Bedtime: 220's max Genitourinary Medical History: Negative for: End Stage Renal Disease Past Medical History Notes: Overactive Bladder Immunological Medical History: Negative for: Lupus Erythematosus; Raynauds; Scleroderma Integumentary (Skin) Medical History: Negative for: History of  Burn Musculoskeletal Medical History: Positive for: Osteoarthritis Negative for: Gout; Rheumatoid Arthritis; Osteomyelitis Past Medical History Notes: Fibromyalgia, toe amputations x 3 Neurologic Medical History: Positive for: Neuropathy Negative for: Dementia; Quadriplegia; Paraplegia; Seizure Disorder Oncologic Medical History: Negative for: Received Chemotherapy; Received Radiation Psychiatric Medical History: Negative for: Anorexia/bulimia; Confinement Anxiety Past Medical History Notes: Anxiety HBO Extended History Items Ear/Nose/Mouth/Throat: Eyes: Eyes: Chronic sinus Cataracts Glaucoma problems/congestion Immunizations Pneumococcal Vaccine: Received Pneumococcal Vaccination: Yes Received Pneumococcal Vaccination On or After 60th Birthday: No Immunization Notes: up to date on pneumonia and tetanus shots Implantable Devices None Family and Social History Cancer: Yes - Child; Diabetes: Yes - Father; Heart Disease: Yes - Mother,Father; Hereditary Spherocytosis: No; Hypertension: Yes - Mother,Father; Kidney Disease: No; Lung Disease: No; Seizures: No; Stroke: No; Thyroid Problems: No; Tuberculosis: No; Never smoker; Marital Status - Married; Alcohol Use: Never; Drug Use: No History; Caffeine Use: Daily - coffee , sodas; Financial Concerns: No; Food, Clothing or Shelter Needs: No; Support System Lacking: No; Transportation Concerns: No Electronic Signature(s) Signed: 04/29/2022 4:03:56 PM By: Kalman Shan DO Signed: 04/29/2022 5:10:40 PM By: Deon Pilling RN, BSN Entered By: Kalman Shan on 04/29/2022 15:14:52 -------------------------------------------------------------------------------- SuperBill Details Patient Name: Date of Service: MO Yesenia San, [name] 04/29/2022 Medical Record Number: [id number] Patient Account Number: [id number] Date of Birth/Sex: Treating RN: October 15, 1951 (71 y.o. 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 S31.109D Unspecified open wound of abdominal wall, unspecified quadrant without penetration into peritoneal cavity, subsequent encounter Facility Procedures CPT4 Code: [id number] Description: 82883 - DEB SUBQ TISSUE 20 SQ CM/< ICD-10 Diagnosis Description 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 Modifier: erity Quantity: 1 CPT4 Code: [id number] Description: 04799 - DEB SUBQ TISS EA ADDL 20CM ICD-10 Diagnosis Description 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

## 2022-05-13 ENCOUNTER — Encounter (HOSPITAL_BASED_OUTPATIENT_CLINIC_OR_DEPARTMENT_OTHER): Payer: Medicare Other | Admitting: Internal Medicine

## 2022-05-20 ENCOUNTER — Ambulatory Visit (HOSPITAL_BASED_OUTPATIENT_CLINIC_OR_DEPARTMENT_OTHER): Payer: Medicare Other | Admitting: Internal Medicine

## 2022-06-03 ENCOUNTER — Encounter (HOSPITAL_BASED_OUTPATIENT_CLINIC_OR_DEPARTMENT_OTHER): Payer: Medicare Other | Admitting: Internal Medicine

## 2022-06-08 DEATH — deceased
